# Patient Record
Sex: Male | Born: 1955 | Race: White | Hispanic: No | Smoking: Current every day smoker
Health system: Southern US, Community
[De-identification: ages and names within clinical notes are randomized; demographics above are authoritative.]

## PROBLEM LIST (undated history)

## (undated) DIAGNOSIS — J189 Pneumonia, unspecified organism: Secondary | ICD-10-CM

## (undated) DIAGNOSIS — F419 Anxiety disorder, unspecified: Secondary | ICD-10-CM

## (undated) DIAGNOSIS — F17200 Nicotine dependence, unspecified, uncomplicated: Secondary | ICD-10-CM

## (undated) DIAGNOSIS — K219 Gastro-esophageal reflux disease without esophagitis: Secondary | ICD-10-CM

## (undated) DIAGNOSIS — I10 Essential (primary) hypertension: Secondary | ICD-10-CM

## (undated) DIAGNOSIS — F209 Schizophrenia, unspecified: Secondary | ICD-10-CM

## (undated) DIAGNOSIS — J449 Chronic obstructive pulmonary disease, unspecified: Secondary | ICD-10-CM

## (undated) HISTORY — PX: TONSILLECTOMY: SUR1361

## (undated) HISTORY — DX: Chronic obstructive pulmonary disease, unspecified: J44.9

## (undated) HISTORY — DX: Gastro-esophageal reflux disease without esophagitis: K21.9

---

## 2004-10-05 ENCOUNTER — Ambulatory Visit: Payer: Self-pay | Admitting: Psychiatry

## 2004-10-05 ENCOUNTER — Inpatient Hospital Stay (HOSPITAL_COMMUNITY): Admission: RE | Admit: 2004-10-05 | Discharge: 2004-10-24 | Payer: Self-pay | Admitting: Psychiatry

## 2004-10-05 ENCOUNTER — Emergency Department (HOSPITAL_COMMUNITY): Admission: EM | Admit: 2004-10-05 | Discharge: 2004-10-05 | Payer: Self-pay | Admitting: Emergency Medicine

## 2004-10-13 ENCOUNTER — Ambulatory Visit (HOSPITAL_COMMUNITY): Admission: RE | Admit: 2004-10-13 | Discharge: 2004-10-13 | Payer: Self-pay | Admitting: Psychiatry

## 2004-10-20 ENCOUNTER — Ambulatory Visit (HOSPITAL_COMMUNITY): Admission: RE | Admit: 2004-10-20 | Discharge: 2004-10-20 | Payer: Self-pay | Admitting: Internal Medicine

## 2004-11-12 ENCOUNTER — Inpatient Hospital Stay (HOSPITAL_COMMUNITY): Admission: EM | Admit: 2004-11-12 | Discharge: 2004-11-12 | Payer: Self-pay | Admitting: Psychiatry

## 2004-11-12 ENCOUNTER — Ambulatory Visit (HOSPITAL_COMMUNITY): Admission: RE | Admit: 2004-11-12 | Discharge: 2004-11-12 | Payer: Self-pay | Admitting: Psychiatry

## 2004-11-12 ENCOUNTER — Ambulatory Visit: Payer: Self-pay | Admitting: Psychiatry

## 2005-05-02 ENCOUNTER — Emergency Department (HOSPITAL_COMMUNITY): Admission: EM | Admit: 2005-05-02 | Discharge: 2005-05-02 | Payer: Self-pay | Admitting: *Deleted

## 2005-08-16 ENCOUNTER — Emergency Department (HOSPITAL_COMMUNITY): Admission: EM | Admit: 2005-08-16 | Discharge: 2005-08-16 | Payer: Self-pay | Admitting: Family Medicine

## 2005-09-30 ENCOUNTER — Emergency Department (HOSPITAL_COMMUNITY): Admission: EM | Admit: 2005-09-30 | Discharge: 2005-10-01 | Payer: Self-pay | Admitting: Emergency Medicine

## 2012-02-15 ENCOUNTER — Encounter (HOSPITAL_COMMUNITY): Payer: Self-pay | Admitting: *Deleted

## 2012-02-15 ENCOUNTER — Emergency Department (HOSPITAL_COMMUNITY): Payer: Medicare Other

## 2012-02-15 ENCOUNTER — Emergency Department (HOSPITAL_COMMUNITY)
Admission: EM | Admit: 2012-02-15 | Discharge: 2012-02-18 | Disposition: A | Discharge status: Other Acute Inpatient Hospital | Payer: Medicare Other | Source: Home / Self Care | Attending: Emergency Medicine | Admitting: Emergency Medicine

## 2012-02-15 ENCOUNTER — Other Ambulatory Visit (HOSPITAL_COMMUNITY): Payer: Self-pay | Admitting: Pharmacy Technician

## 2012-02-15 DIAGNOSIS — R4182 Altered mental status, unspecified: Secondary | ICD-10-CM | POA: Insufficient documentation

## 2012-02-15 DIAGNOSIS — IMO0002 Reserved for concepts with insufficient information to code with codable children: Secondary | ICD-10-CM | POA: Insufficient documentation

## 2012-02-15 DIAGNOSIS — J90 Pleural effusion, not elsewhere classified: Secondary | ICD-10-CM | POA: Diagnosis not present

## 2012-02-15 DIAGNOSIS — R079 Chest pain, unspecified: Secondary | ICD-10-CM | POA: Insufficient documentation

## 2012-02-15 DIAGNOSIS — F22 Delusional disorders: Secondary | ICD-10-CM | POA: Insufficient documentation

## 2012-02-15 DIAGNOSIS — R918 Other nonspecific abnormal finding of lung field: Secondary | ICD-10-CM | POA: Diagnosis not present

## 2012-02-15 DIAGNOSIS — J189 Pneumonia, unspecified organism: Secondary | ICD-10-CM

## 2012-02-15 DIAGNOSIS — F209 Schizophrenia, unspecified: Secondary | ICD-10-CM

## 2012-02-15 DIAGNOSIS — R000 Tachycardia, unspecified: Secondary | ICD-10-CM | POA: Insufficient documentation

## 2012-02-15 DIAGNOSIS — R0602 Shortness of breath: Secondary | ICD-10-CM | POA: Insufficient documentation

## 2012-02-15 DIAGNOSIS — R413 Other amnesia: Secondary | ICD-10-CM | POA: Insufficient documentation

## 2012-02-15 HISTORY — DX: Schizophrenia, unspecified: F20.9

## 2012-02-15 HISTORY — DX: Essential (primary) hypertension: I10

## 2012-02-15 LAB — URINALYSIS, ROUTINE W REFLEX MICROSCOPIC
Bilirubin Urine: NEGATIVE
Glucose, UA: NEGATIVE mg/dL
Hgb urine dipstick: NEGATIVE
Ketones, ur: NEGATIVE mg/dL
Leukocytes, UA: NEGATIVE
Nitrite: NEGATIVE
Protein, ur: NEGATIVE mg/dL
Specific Gravity, Urine: 1.015 (ref 1.005–1.030)
Urobilinogen, UA: 0.2 mg/dL (ref 0.0–1.0)
pH: 6.5 (ref 5.0–8.0)

## 2012-02-15 LAB — COMPREHENSIVE METABOLIC PANEL
ALT: 27 U/L (ref 0–53)
AST: 34 U/L (ref 0–37)
Albumin: 3.8 g/dL (ref 3.5–5.2)
Alkaline Phosphatase: 115 U/L (ref 39–117)
BUN: 15 mg/dL (ref 6–23)
CO2: 27 mEq/L (ref 19–32)
Calcium: 9.5 mg/dL (ref 8.4–10.5)
Chloride: 99 mEq/L (ref 96–112)
Creatinine, Ser: 1.54 mg/dL — ABNORMAL HIGH (ref 0.50–1.35)
GFR calc Af Amer: 57 mL/min — ABNORMAL LOW (ref >90)
GFR calc non Af Amer: 50 mL/min — ABNORMAL LOW (ref >90)
Glucose, Bld: 100 mg/dL — ABNORMAL HIGH (ref 70–99)
Potassium: 3.4 mEq/L — ABNORMAL LOW (ref 3.5–5.1)
Sodium: 137 mEq/L (ref 135–145)
Total Bilirubin: 0.5 mg/dL (ref 0.3–1.2)
Total Protein: 7.6 g/dL (ref 6.0–8.3)

## 2012-02-15 LAB — RAPID URINE DRUG SCREEN, HOSP PERFORMED
Amphetamines: NOT DETECTED
Barbiturates: NOT DETECTED
Benzodiazepines: POSITIVE — AB
Cocaine: NOT DETECTED
Opiates: NOT DETECTED
Tetrahydrocannabinol: POSITIVE — AB

## 2012-02-15 LAB — CBC
HCT: 41 % (ref 39.0–52.0)
Hemoglobin: 13.6 g/dL (ref 13.0–17.0)
MCH: 27.7 pg (ref 26.0–34.0)
MCHC: 33.2 g/dL (ref 30.0–36.0)
MCV: 83.5 fL (ref 78.0–100.0)
Platelets: 220 10*3/uL (ref 150–400)
RBC: 4.91 MIL/uL (ref 4.22–5.81)
RDW: 16.1 % — ABNORMAL HIGH (ref 11.5–15.5)
WBC: 20 10*3/uL — ABNORMAL HIGH (ref 4.0–10.5)

## 2012-02-15 LAB — ETHANOL: Alcohol, Ethyl (B): <11 mg/dL (ref 0–11)

## 2012-02-15 MED ORDER — NICOTINE 21 MG/24HR TD PT24
21.0000 mg | MEDICATED_PATCH | Freq: Once | TRANSDERMAL | Status: AC
  Administered 2012-02-15 – 2012-02-16 (×2): 21 mg via TRANSDERMAL
  Filled 2012-02-15 (×2): qty 1

## 2012-02-15 MED ORDER — HALOPERIDOL LACTATE 5 MG/ML IJ SOLN
10.0000 mg | Freq: Once | INTRAMUSCULAR | Status: AC
  Administered 2012-02-15: 10 mg via INTRAMUSCULAR
  Filled 2012-02-15: qty 2

## 2012-02-15 MED ORDER — LORAZEPAM 2 MG/ML IJ SOLN
2.0000 mg | Freq: Once | INTRAMUSCULAR | Status: AC
  Administered 2012-02-15: 2 mg via INTRAMUSCULAR
  Filled 2012-02-15: qty 1
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
## 2012-02-15 NOTE — ED Notes (Signed)
Ballard Russell, pt's brother is taking all belongings home with him. [phone]
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
## 2012-02-15 NOTE — BH Assessment (Signed)
Assessment Note   David [name] is an 56 y.o. male who presents to [facility] under IVC. Pt refused to answer most of this writers questions, Information primarily comes from pt's family, who are at bedside. Pt's brother, Ballard Russell [phone]), and Father Jermery Caratachea [phone]), report pt has a history of schizophrenia. Family reports pt was diagnosed when he was 73. Per family, pt has been hospitalized numerous times over past 30 years, including several stays at [facility] and [facility]. Family states pt has been receiving haldol injections of 150 mg monthly and taking Librium 25mg , for the past 5 years, and has not displayed any symptoms congruent with schizophrenia during that time. Family reports pt has had difficulty with insurance paying for medications and has not been receiving Librium for past couple of weeks. Family reports pt has not slept in past 5 days. They state he has been pacing the floors, talking to the television, and stating that he is "the king." Pt has also had an increase in aggression towards family since Tuesday. Brother reports "its like a light switched, he was my brother one day, then Tuesday I couldn't talk to him, he was out there." Family reports pt's step father and aunt died in the past 2 weeks, which family believed may have triggered recent episode. Per family, pt has not expressed any SI or HI and denies any current SA. 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   Pt appeared to be responding to internal stimuli. Pt was not willing to answer question at this time, stating "I'd rather not say," any time asked a question. Pt appeared to be calm at this time. Pt's mother David [name] is pt's POA. POA Paperwork in pt's chart.          Axis I: Psychotic Disorder NOS Axis II: Deferred Axis III:  Past Medical History  Diagnosis Date  . Schizophrenia   . Hypertension    Axis IV: other psychosocial or environmental problems and problems with access  to health care services Axis V: 11-20 some danger of hurting self or others possible OR occasionally fails to maintain minimal personal hygiene OR gross impairment in communication  Past Medical History:  Past Medical History  Diagnosis Date  . Schizophrenia   . Hypertension     History reviewed. No pertinent past surgical history.  Family History: History reviewed. No pertinent family history.  Social History:  does not have a smoking history on file. He does not have any smokeless tobacco history on file. His alcohol and drug histories not on file.  Additional Social History:  Alcohol / Drug Use History of alcohol / drug use?: Yes Substance #1 Name of Substance 1: Alcohol 1 - Last Use / Amount: 12 weeks ago/ unknown amount.  Allergies: No Known Allergies  Home Medications:  Medications Prior to Admission  Medication Dose Route Frequency Provider Last Rate Last Dose  . haloperidol lactate (HALDOL) injection 10 mg  10 mg Intramuscular Once Doug Sou, MD   10 mg at 02/15/12 2032  . LORazepam (ATIVAN) injection 2 mg  2 mg Intramuscular Once Doug Sou, MD   2 mg at 02/15/12 2031   No current outpatient prescriptions on file as of 02/15/2012.    OB/GYN Status:  No LMP for male patient.  General Assessment Data Location of Assessment: [facility] ED ACT Assessment: Yes Living Arrangements: Family members;Parent Can pt return to current living arrangement?: Yes Admission Status: Involuntary Is patient capable  of signing voluntary admission?: No Transfer from: Acute Hospital Referral Source: Self/Family/Friend  Education Status Is patient currently in school?: No  Risk to self Suicidal Ideation: No Suicidal Intent: No Is patient at risk for suicide?: No Suicidal Plan?: No Access to Means: Yes Specify Access to Suicidal Means: mediactions What has been your use of drugs/alcohol within the last 12 months?: alcohol Previous Attempts/Gestures: No How many times?: 0    Other Self Harm Risks: none Triggers for Past Attempts: None known Intentional Self Injurious Behavior: None Family Suicide History: No Recent stressful life event(s): Loss (Comment) (death of step father and aunt in past week) Persecutory voices/beliefs?: No Depression: No Depression Symptoms:  (none noted) Substance abuse history and/or treatment for substance abuse?: No Suicide prevention information given to non-admitted patients: Not applicable  Risk to Others Homicidal Ideation: No Thoughts of Harm to Others: No Current Homicidal Intent: No Current Homicidal Plan: No Access to Homicidal Means: No Identified Victim: none History of harm to others?: Yes Assessment of Violence: None Noted Does patient have access to weapons?: No Criminal Charges Pending?: No Does patient have a court date: No  Psychosis Hallucinations: Auditory;Visual Delusions: Grandiose  Mental Status Report Appear/Hygiene: Bizarre Eye Contact: Poor Motor Activity: Unremarkable Speech: Aggressive Level of Consciousness: Alert Mood: Suspicious;Apprehensive Affect: Apprehensive Anxiety Level: Minimal Thought Processes: Flight of Ideas Judgement: Impaired Orientation: Person;Place Obsessive Compulsive Thoughts/Behaviors: None  Cognitive Functioning Concentration: Normal Memory: Recent Intact IQ: Average Insight: Poor Impulse Control: Poor Appetite: Fair Weight Loss: 0  Weight Gain: 0  Sleep: Decreased Total Hours of Sleep: 1  Vegetative Symptoms: None  Prior Inpatient Therapy Prior Inpatient Therapy: Yes Prior Therapy Dates: unknown exact dates, several times over past 30 years Prior Therapy Facilty/Provider(s): [facility] and [facility] Reason for Treatment: psychosis  Prior Outpatient Therapy Prior Outpatient Therapy: Yes Prior Therapy Dates: ongoing Prior Therapy Facilty/Provider(s): [facility]  Reason for Treatment: schizophrenia  ADL Screening (condition at time of  admission) Patient's cognitive ability adequate to safely complete daily activities?: Yes Patient able to express need for assistance with ADLs?: Yes Independently performs ADLs?: Yes Weakness of Legs: None Weakness of Arms/Hands: None       Abuse/Neglect Assessment (Assessment to be complete while patient is alone) Physical Abuse: Denies Verbal Abuse: Denies Sexual Abuse: Denies Exploitation of patient/patient's resources: Denies Self-Neglect: Denies Values / Beliefs Cultural Requests During Hospitalization: None Spiritual Requests During Hospitalization: None   Advance Directives (For Healthcare) Advance Directive: Patient has advance directive, copy in chart Type of Advance Directive: Other (Comment) (Power of attorney) Nutrition Screen Diet: Regular (Sandwich and drink given) Unintentional weight loss greater than 10lbs within the last month: No Dysphagia: No Home Tube Feeding or Total Parenteral Nutrition (TPN): No Patient appears severely malnourished: No Pregnant or Lactating: No  Additional Information 1:1 In Past 12 Months?: No CIRT Risk: Yes Elopement Risk: No Does patient have medical clearance?: Yes     Disposition:  Disposition Disposition of Patient: Inpatient treatment program Type of inpatient treatment program: Adult  On Site Evaluation by:   Reviewed with Physician:     Marjean Donna 02/15/2012 9:46 PM

## 2012-02-16 ENCOUNTER — Emergency Department (HOSPITAL_COMMUNITY): Payer: Medicare Other

## 2012-02-16 DIAGNOSIS — R0602 Shortness of breath: Secondary | ICD-10-CM | POA: Diagnosis not present

## 2012-02-16 DIAGNOSIS — J90 Pleural effusion, not elsewhere classified: Secondary | ICD-10-CM | POA: Diagnosis not present

## 2012-02-16 DIAGNOSIS — R918 Other nonspecific abnormal finding of lung field: Secondary | ICD-10-CM | POA: Diagnosis not present

## 2012-02-16 LAB — CBC
HCT: 39.6 % (ref 39.0–52.0)
Hemoglobin: 12.7 g/dL — ABNORMAL LOW (ref 13.0–17.0)
MCHC: 32.1 g/dL (ref 30.0–36.0)
MCV: 84.4 fL (ref 78.0–100.0)

## 2012-02-16 LAB — DIFFERENTIAL
Basophils Relative: 1 % (ref 0–1)
Lymphocytes Relative: 12 % (ref 12–46)
Monocytes Absolute: 1.8 10*3/uL — ABNORMAL HIGH (ref 0.1–1.0)
Monocytes Relative: 9 % (ref 3–12)
Neutro Abs: 13.3 10*3/uL — ABNORMAL HIGH (ref 1.7–7.7)

## 2012-02-16 MED ORDER — IBUPROFEN 200 MG PO TABS: 400.0000 mg | ORAL_TABLET | Freq: Four times a day (QID) | ORAL | Status: DC | PRN

## 2012-02-16 MED ORDER — DIAZEPAM 5 MG PO TABS
10.0000 mg | ORAL_TABLET | Freq: Two times a day (BID) | ORAL | Status: DC
  Administered 2012-02-16 – 2012-02-18 (×4): 10 mg via ORAL
  Filled 2012-02-16: qty 1
  Filled 2012-02-16 (×2): qty 2
  Filled 2012-02-16 (×3): qty 1

## 2012-02-16 MED ORDER — PANTOPRAZOLE SODIUM 40 MG PO TBEC
40.0000 mg | DELAYED_RELEASE_TABLET | ORAL | Status: DC
  Administered 2012-02-16 – 2012-02-18 (×3): 40 mg via ORAL
  Filled 2012-02-16 (×3): qty 1

## 2012-02-16 MED ORDER — LEVOFLOXACIN 750 MG PO TABS
750.0000 mg | ORAL_TABLET | Freq: Every day | ORAL | Status: DC
  Administered 2012-02-16 – 2012-02-18 (×3): 750 mg via ORAL
  Filled 2012-02-16 (×4): qty 1

## 2012-02-16 MED ORDER — NICOTINE 21 MG/24HR TD PT24: 21.0000 mg | MEDICATED_PATCH | Freq: Once | TRANSDERMAL | Status: DC

## 2012-02-16 MED ORDER — ZOLPIDEM TARTRATE 10 MG PO TABS
10.0000 mg | ORAL_TABLET | Freq: Every evening | ORAL | Status: DC | PRN
  Administered 2012-02-17: 10 mg via ORAL
  Filled 2012-02-16: qty 1

## 2012-02-16 MED ORDER — POTASSIUM CHLORIDE CRYS ER 20 MEQ PO TBCR
40.0000 meq | EXTENDED_RELEASE_TABLET | Freq: Once | ORAL | Status: AC
  Administered 2012-02-16: 40 meq via ORAL
  Filled 2012-02-16: qty 2

## 2012-02-16 MED ORDER — OLANZAPINE 5 MG PO TABS
5.0000 mg | ORAL_TABLET | Freq: Two times a day (BID) | ORAL | Status: DC
  Administered 2012-02-16 – 2012-02-18 (×4): 5 mg via ORAL
  Filled 2012-02-16 (×4): qty 1

## 2012-02-16 MED ORDER — IBUPROFEN 200 MG PO TABS
400.0000 mg | ORAL_TABLET | Freq: Once | ORAL | Status: AC
  Administered 2012-02-16: 400 mg via ORAL
  Filled 2012-02-16: qty 2

## 2012-02-16 MED ORDER — LORAZEPAM 1 MG PO TABS
2.0000 mg | ORAL_TABLET | Freq: Four times a day (QID) | ORAL | Status: DC | PRN
  Administered 2012-02-16 – 2012-02-18 (×6): 2 mg via ORAL
  Filled 2012-02-16 (×7): qty 2

## 2012-02-16 NOTE — ED Provider Notes (Signed)
8:09 AM Assessed  this morning. Patient is in no acute distress. Initial workup revealed a leukocytosis which is consistent on repeat labs. Followup chest x-ray is consistent with the first is well. Not clinically convincing for pneumonia but concerning enough to treat. Patient is tachycardic on my examination and per review of vital signs has been so since arrival. He is hemodynamically stable otherwise. Did appreciate right-sided rhonchi on lung auscultation but L clear. Coughing as well. Patient does have a history of smoking and states that he has a chronic cough. Is clearly psychotic but was cooperative with me this morning. From my perspective can continue to be tx'd with oral abx if placed in psych facility.  Raeford Razor, MD 02/16/12 [phone]

## 2012-02-16 NOTE — ED Notes (Addendum)
Pt out to the desk asking for the floor plans of the building to give to his brother greg when asked why he needed this pt reports that he just wants it to be safe for everybody pt was reassured that this was a safe place and that we were taking good care of him pt agreed with me. Pt was offered a snack sprite and rice crispy given to pt pt then asked how long have theses tiles been here pt concerned with the cleaning process of the floor pt reports that the floor needs to cleaned commercially 2 times a week  pt then back to his room pt then back out to the desk talking about how to take out an add in the paper pt talking about blasting a mountain to make a passage way for his friend Merlyn Albert he is a friend of [employer]

## 2012-02-16 NOTE — BH Assessment (Signed)
Assessment Note   David [name] is an 56 y.o. male who was reassessed 02/16/12. Pt is under IVC for bizarre behavior. Pt is currently delusional. Pt responded to writer's questions by discussing his relationship with pro tennis player Deeann Dowse. 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 He also states he'd like to "kiss the fool" out of her. His affect is calm and cooperative. Pt denies SI and HI. He also denies A/VH.  From pt's 02/14/15 assessment -Information primarily comes from pt's family, who are at bedside. Pt's brother, Ballard Russell [phone]), and Father Eesa Justiss [phone]), report pt has a history of schizophrenia. Family reports pt was diagnosed when he was 23. Per family, pt has been hospitalized numerous times over past 30 years, including several stays at [facility] and [facility]. Family states pt has been receiving haldol injections of 150 mg monthly and taking Librium 25mg , for the past 5 years, and has not displayed any symptoms congruent with schizophrenia during that time. Family reports pt has had difficulty with insurance paying for medications and has not been receiving Librium for past couple of weeks. Family reports pt has not slept in past 5 days. They state he has been pacing the floors, talking to the television, and stating that he is "the king." Pt has also had an increase in aggression towards family since Tuesday. Brother reports "its like a light switched, he was my brother one day, then Tuesday I couldn't talk to him, he was out there." Family reports pt's step father and aunt died in the past 2 weeks, which family believed may have triggered recent episode. Per family, pt has not expressed any SI or HI and denies any current SA. 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   Axis I: Psychotic Disorder NOS Axis II: Deferred Axis III:  Past Medical History  Diagnosis Date  . Schizophrenia     . Hypertension    Axis IV: other psychosocial or environmental problems and problems with access to health care services Axis V: 11-20 some danger of hurting self or others possible OR occasionally fails to maintain minimal personal hygiene OR gross impairment in communication  Past Medical History:  Past Medical History  Diagnosis Date  . Schizophrenia   . Hypertension     History reviewed. No pertinent past surgical history.  Family History: History reviewed. No pertinent family history.  Social History:  does not have a smoking history on file. He does not have any smokeless tobacco history on file. His alcohol and drug histories not on file.  Additional Social History:  Alcohol / Drug Use History of alcohol / drug use?: Yes Substance #1 Name of Substance 1: Alcohol 1 - Last Use / Amount: 12 weeks ago/ unknown amount.  Allergies: No Known Allergies  Home Medications:  Medications Prior to Admission  Medication Dose Route Frequency Provider Last Rate Last Dose  . diazepam (VALIUM) tablet 10 mg  10 mg Oral BID Glynn Octave, MD   10 mg at 02/16/12 2245  . haloperidol lactate (HALDOL) injection 10 mg  10 mg Intramuscular Once Doug Sou, MD   10 mg at 02/15/12 2032  . ibuprofen (ADVIL,MOTRIN) tablet 400 mg  400 mg Oral Once Raeford Razor, MD   400 mg at 02/16/12 1248  . ibuprofen (ADVIL,MOTRIN) tablet 400 mg  400 mg Oral Q6H PRN Raeford Razor, MD      .  levofloxacin (LEVAQUIN) tablet 750 mg  750 mg Oral Daily Raeford Razor, MD   750 mg at 02/16/12 1038  . LORazepam (ATIVAN) injection 2 mg  2 mg Intramuscular Once Doug Sou, MD   2 mg at 02/15/12 2031  . LORazepam (ATIVAN) injection 2 mg  2 mg Intramuscular Once Doug Sou, MD   2 mg at 02/15/12 2220  . LORazepam (ATIVAN) tablet 2 mg  2 mg Oral Q6H PRN Doug Sou, MD   2 mg at 02/16/12 1652  . nicotine (NICODERM CQ - dosed in mg/24 hours) patch 21 mg  21 mg Transdermal Once Doug Sou, MD   21 mg at  02/16/12 0843  . OLANZapine (ZYPREXA) tablet 5 mg  5 mg Oral BID Glynn Octave, MD   5 mg at 02/16/12 2245  . pantoprazole (PROTONIX) EC tablet 40 mg  40 mg Oral 1 day or 1 dose Doug Sou, MD   40 mg at 02/16/12 0032  . potassium chloride SA (K-DUR,KLOR-CON) CR tablet 40 mEq  40 mEq Oral Once Doug Sou, MD   40 mEq at 02/16/12 0059  . zolpidem (AMBIEN) tablet 10 mg  10 mg Oral QHS PRN Doug Sou, MD      . DISCONTD: nicotine (NICODERM CQ - dosed in mg/24 hours) patch 21 mg  21 mg Transdermal Once Doug Sou, MD       No current outpatient prescriptions on file as of 02/15/2012.    OB/GYN Status:  No LMP for male patient.  General Assessment Data Location of Assessment: [facility] ACT Assessment: Yes Living Arrangements: Family members;Parent Can pt return to current living arrangement?: Yes Admission Status: Involuntary Is patient capable of signing voluntary admission?: No Transfer from: Acute Hospital Referral Source: Self/Family/Friend  Education Status Is patient currently in school?: No  Risk to self Suicidal Ideation: No Suicidal Intent: No Is patient at risk for suicide?: No Suicidal Plan?: No Access to Means: Yes Specify Access to Suicidal Means: mediactions What has been your use of drugs/alcohol within the last 12 months?: alcohol Previous Attempts/Gestures: No How many times?: 0  Other Self Harm Risks: none Triggers for Past Attempts: None known Intentional Self Injurious Behavior: None Family Suicide History: No Recent stressful life event(s): Loss (Comment) (death of step father and aunt in past week) Persecutory voices/beliefs?: No Depression: No Depression Symptoms:  (none noted) Substance abuse history and/or treatment for substance abuse?: No Suicide prevention information given to non-admitted patients: Not applicable  Risk to Others Homicidal Ideation: No Thoughts of Harm to Others: No Current Homicidal Intent: No Current Homicidal  Plan: No Access to Homicidal Means: No Identified Victim: none History of harm to others?: Yes Assessment of Violence: None Noted Does patient have access to weapons?: No Criminal Charges Pending?: No Does patient have a court date: No  Psychosis Hallucinations: Auditory;Visual Delusions: Grandiose  Mental Status Report Appear/Hygiene: Bizarre Eye Contact: Poor Motor Activity: Restlessness;Freedom of movement Speech: Aggressive Level of Consciousness: Alert Mood: Suspicious;Apprehensive Affect: Apprehensive Anxiety Level: Minimal Thought Processes: Flight of Ideas Judgement: Impaired Orientation: Person;Place Obsessive Compulsive Thoughts/Behaviors: None  Cognitive Functioning Concentration: Normal Memory: Recent Intact IQ: Average Insight: Poor Impulse Control: Poor Appetite: Fair Weight Loss: 0  Weight Gain: 0  Sleep: Decreased Total Hours of Sleep: 1  Vegetative Symptoms: None  Prior Inpatient Therapy Prior Inpatient Therapy: Yes Prior Therapy Dates: unknown exact dates, several times over past 30 years Prior Therapy Facilty/Provider(s): [facility] and [facility] Reason for Treatment: psychosis  Prior Outpatient Therapy Prior Outpatient  Therapy: Yes Prior Therapy Dates: ongoing Prior Therapy Facilty/Provider(s): [facility]  Reason for Treatment: schizophrenia  ADL Screening (condition at time of admission) Patient's cognitive ability adequate to safely complete daily activities?: Yes Patient able to express need for assistance with ADLs?: Yes Independently performs ADLs?: Yes Weakness of Legs: None Weakness of Arms/Hands: None       Abuse/Neglect Assessment (Assessment to be complete while patient is alone) Physical Abuse: Denies Verbal Abuse: Denies Sexual Abuse: Denies Exploitation of patient/patient's resources: Denies Self-Neglect: Denies Values / Beliefs Cultural Requests During Hospitalization: None Spiritual Requests During  Hospitalization: None   Advance Directives (For Healthcare) Advance Directive: Patient has advance directive, copy in chart Type of Advance Directive: Other (Comment) (Power of attorney) Nutrition Screen Diet: Regular (Sandwich and drink given) Unintentional weight loss greater than 10lbs within the last month: No Dysphagia: No Home Tube Feeding or Total Parenteral Nutrition (TPN): No Patient appears severely malnourished: No Pregnant or Lactating: No  Additional Information 1:1 In Past 12 Months?: No CIRT Risk: Yes Elopement Risk: No Does patient have medical clearance?: Yes     Disposition:  Disposition Disposition of Patient: Inpatient treatment program Type of inpatient treatment program: Adult  Telepsych recommended inpatient treatment. Under review at [facility].  On Site Evaluation by:   Reviewed with Physician:     Donnamarie Rossetti P 02/16/2012 11:11 PM

## 2012-02-16 NOTE — ED Notes (Signed)
Pt agitated, cursing, talking non-sensible, medicated per MD orders. Pt went on the other side of the hall to talk to another pt, had to be asked several times to go back to his room. Still cursing.

## 2012-02-16 NOTE — ED Notes (Signed)
Pt continues to talk to himself, cursing at times, labile but remains laying in his bed. MD notified of psychosis.
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
## 2012-02-16 NOTE — ED Notes (Signed)
Per Act to hold off on tele psych consult, pt's information is being reviewed at [facility]

## 2012-02-16 NOTE — ED Notes (Signed)
Pt was just seen by EDP

## 2012-02-16 NOTE — BH Assessment (Signed)
Assessment Note  Telepsych paperwork completed and faxed. Dr. Rob Bunting will be conducting telepsych consult.

## 2012-02-16 NOTE — BH Assessment (Deleted)
Writer called [facility] to inquire re: bed availability. Elizabeth stated to call back Mon during business hrs as the facility handles admissions during weekdays only.      

## 2012-02-16 NOTE — ED Notes (Signed)
Up in hall, requesting Valium--medicated as ordered

## 2012-02-16 NOTE — ED Provider Notes (Signed)
Medical screening examination/treatment/procedure(s) were conducted as a shared visit with non-physician practitioner(s) and myself.  I personally evaluated the patient during the encounter  Doug Sou, MD 02/16/12 [phone]
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
## 2012-02-17 MED ORDER — NICOTINE 21 MG/24HR TD PT24
MEDICATED_PATCH | TRANSDERMAL | Status: AC
  Administered 2012-02-17: 21 mg
  Filled 2012-02-17: qty 1

## 2012-02-17 MED ORDER — LORAZEPAM 1 MG PO TABS
2.0000 mg | ORAL_TABLET | Freq: Once | ORAL | Status: AC
  Administered 2012-02-17: 2 mg via ORAL

## 2012-02-17 NOTE — ED Notes (Signed)
Pt up to nursing station talking non-sensical ie ,  is going to have brown stringy stuff running down all over it but most of it can go to [place]; redirected to room.

## 2012-02-17 NOTE — ED Notes (Signed)
Pt back out to the desk asking for a level I asked pt what he needed one for pt reports while I am here I am going to check it out pt then to the br reports that the tiles need cleaned with a toothbrush. Pt then out talking to the officer in the unit pt talking loudly was reminded that the time was 2 am and that he needed to be quiet not to disturb others pt back to his room and then straight back out to the desk talking about his boys

## 2012-02-17 NOTE — ED Notes (Signed)
EDP talking to pt.

## 2012-02-17 NOTE — ED Notes (Signed)
Pt said that the t.v. Karma Greaser told him she was giving him a male leopard. He said she has 8 of them. Pt said he wants writer to go get his leopard. Pt took without incident.

## 2012-02-17 NOTE — ED Provider Notes (Signed)
The patient is awake alert and nontoxic in appearance with ongoing delusions in the psych ED currently under involuntary commitment and awaiting placement.  Hurman Horn, MD 02/17/12 [phone]

## 2012-02-17 NOTE — ED Notes (Signed)
Pt up to nurses station "I hate to tell ya'll this but this building is on lava, it's not dangerous right now". Reassured pt he is safe here, he agreed.

## 2012-02-17 NOTE — ED Notes (Signed)
Pt up to nurses station using the phone 

## 2012-02-17 NOTE — ED Notes (Signed)
Pt had one arm out the locked door when the lunch trays were being brought into the unit, the off duty officer and service response assisted and pt didn't get out of the unit. Just prior to that pt was intrusive with another pt's personal space. MD notified new orders obtained, v.o. readback 2 mg ativan po stat per dr Prescilla Sours

## 2012-02-17 NOTE — ED Notes (Signed)
Pt yelling out "everyone be very still", pt then yelling "I don't even get a thank you, a mother fucking thank you nothing" pt reminded that he needed to watch his language and he was disrupting others pt apologized reports "I was doing a seismic reading"

## 2012-02-18 ENCOUNTER — Encounter (HOSPITAL_COMMUNITY): Payer: Self-pay

## 2012-02-18 ENCOUNTER — Inpatient Hospital Stay (HOSPITAL_COMMUNITY)
Admission: EM | Admit: 2012-02-18 | Discharge: 2012-03-04 | Disposition: A | Discharge status: Home / Self Care | Payer: Medicare Other | Attending: Psychiatry | Admitting: Psychiatry

## 2012-02-18 ENCOUNTER — Emergency Department (HOSPITAL_COMMUNITY): Payer: Medicare Other

## 2012-02-18 ENCOUNTER — Other Ambulatory Visit: Payer: Self-pay

## 2012-02-18 DIAGNOSIS — F4320 Adjustment disorder, unspecified: Secondary | ICD-10-CM

## 2012-02-18 DIAGNOSIS — F200 Paranoid schizophrenia: Secondary | ICD-10-CM | POA: Diagnosis not present

## 2012-02-18 DIAGNOSIS — I10 Essential (primary) hypertension: Secondary | ICD-10-CM | POA: Diagnosis present

## 2012-02-18 DIAGNOSIS — R000 Tachycardia, unspecified: Secondary | ICD-10-CM | POA: Diagnosis not present

## 2012-02-18 DIAGNOSIS — D696 Thrombocytopenia, unspecified: Secondary | ICD-10-CM | POA: Diagnosis not present

## 2012-02-18 DIAGNOSIS — F05 Delirium due to known physiological condition: Secondary | ICD-10-CM | POA: Diagnosis not present

## 2012-02-18 DIAGNOSIS — J984 Other disorders of lung: Secondary | ICD-10-CM | POA: Diagnosis not present

## 2012-02-18 DIAGNOSIS — J189 Pneumonia, unspecified organism: Secondary | ICD-10-CM | POA: Diagnosis present

## 2012-02-18 DIAGNOSIS — I129 Hypertensive chronic kidney disease with stage 1 through stage 4 chronic kidney disease, or unspecified chronic kidney disease: Secondary | ICD-10-CM | POA: Diagnosis not present

## 2012-02-18 DIAGNOSIS — Z91199 Patient's noncompliance with other medical treatment and regimen due to unspecified reason: Secondary | ICD-10-CM

## 2012-02-18 DIAGNOSIS — F411 Generalized anxiety disorder: Secondary | ICD-10-CM | POA: Diagnosis present

## 2012-02-18 DIAGNOSIS — D7289 Other specified disorders of white blood cells: Secondary | ICD-10-CM | POA: Diagnosis not present

## 2012-02-18 DIAGNOSIS — R918 Other nonspecific abnormal finding of lung field: Secondary | ICD-10-CM | POA: Diagnosis not present

## 2012-02-18 DIAGNOSIS — E876 Hypokalemia: Secondary | ICD-10-CM | POA: Diagnosis not present

## 2012-02-18 DIAGNOSIS — R0609 Other forms of dyspnea: Secondary | ICD-10-CM | POA: Diagnosis not present

## 2012-02-18 DIAGNOSIS — Z79899 Other long term (current) drug therapy: Secondary | ICD-10-CM

## 2012-02-18 DIAGNOSIS — F17200 Nicotine dependence, unspecified, uncomplicated: Secondary | ICD-10-CM

## 2012-02-18 DIAGNOSIS — N183 Chronic kidney disease, stage 3 unspecified: Secondary | ICD-10-CM | POA: Diagnosis not present

## 2012-02-18 DIAGNOSIS — J159 Unspecified bacterial pneumonia: Secondary | ICD-10-CM | POA: Diagnosis not present

## 2012-02-18 DIAGNOSIS — N182 Chronic kidney disease, stage 2 (mild): Secondary | ICD-10-CM | POA: Diagnosis not present

## 2012-02-18 DIAGNOSIS — J9819 Other pulmonary collapse: Secondary | ICD-10-CM | POA: Diagnosis not present

## 2012-02-18 DIAGNOSIS — F209 Schizophrenia, unspecified: Secondary | ICD-10-CM | POA: Diagnosis not present

## 2012-02-18 DIAGNOSIS — J90 Pleural effusion, not elsewhere classified: Secondary | ICD-10-CM | POA: Diagnosis not present

## 2012-02-18 DIAGNOSIS — R1110 Vomiting, unspecified: Secondary | ICD-10-CM | POA: Diagnosis not present

## 2012-02-18 DIAGNOSIS — D649 Anemia, unspecified: Secondary | ICD-10-CM | POA: Diagnosis not present

## 2012-02-18 DIAGNOSIS — Z9119 Patient's noncompliance with other medical treatment and regimen: Secondary | ICD-10-CM

## 2012-02-18 DIAGNOSIS — E871 Hypo-osmolality and hyponatremia: Secondary | ICD-10-CM | POA: Diagnosis not present

## 2012-02-18 DIAGNOSIS — R0602 Shortness of breath: Secondary | ICD-10-CM | POA: Diagnosis not present

## 2012-02-18 LAB — URINALYSIS, ROUTINE W REFLEX MICROSCOPIC
Glucose, UA: NEGATIVE mg/dL
Leukocytes, UA: NEGATIVE
Protein, ur: NEGATIVE mg/dL
pH: 7 (ref 5.0–8.0)

## 2012-02-18 LAB — CBC
HCT: 39.7 % (ref 39.0–52.0)
Hemoglobin: 12.8 g/dL — ABNORMAL LOW (ref 13.0–17.0)
MCH: 27.3 pg (ref 26.0–34.0)
MCHC: 32.2 g/dL (ref 30.0–36.0)

## 2012-02-18 LAB — DIFFERENTIAL
Basophils Relative: 1 % (ref 0–1)
Eosinophils Absolute: 1.8 10*3/uL — ABNORMAL HIGH (ref 0.0–0.7)
Monocytes Absolute: 1.1 10*3/uL — ABNORMAL HIGH (ref 0.1–1.0)
Monocytes Relative: 8 % (ref 3–12)

## 2012-02-18 LAB — COMPREHENSIVE METABOLIC PANEL
Albumin: 3.2 g/dL — ABNORMAL LOW (ref 3.5–5.2)
BUN: 15 mg/dL (ref 6–23)
Creatinine, Ser: 1.57 mg/dL — ABNORMAL HIGH (ref 0.50–1.35)
Total Protein: 6.8 g/dL (ref 6.0–8.3)

## 2012-02-18 MED ORDER — BENZTROPINE MESYLATE 1 MG PO TABS
1.0000 mg | ORAL_TABLET | ORAL | Status: DC
  Administered 2012-02-18 – 2012-03-04 (×35): 1 mg via ORAL
  Filled 2012-02-18 (×46): qty 1

## 2012-02-18 MED ORDER — HYDROXYZINE HCL 50 MG PO TABS
50.0000 mg | ORAL_TABLET | Freq: Three times a day (TID) | ORAL | Status: DC | PRN
  Administered 2012-02-18 – 2012-02-25 (×6): 50 mg via ORAL
  Filled 2012-02-18: qty 1

## 2012-02-18 MED ORDER — LORAZEPAM 1 MG PO TABS
2.0000 mg | ORAL_TABLET | Freq: Once | ORAL | Status: AC
  Administered 2012-02-18: 2 mg via ORAL
  Administered 2012-02-19: 05:00:00 via ORAL

## 2012-02-18 MED ORDER — HYDROCHLOROTHIAZIDE 12.5 MG PO CAPS
12.5000 mg | ORAL_CAPSULE | Freq: Every day | ORAL | Status: DC
  Administered 2012-02-18: 12.5 mg via ORAL
  Filled 2012-02-18 (×2): qty 1

## 2012-02-18 MED ORDER — HALOPERIDOL 5 MG PO TABS
5.0000 mg | ORAL_TABLET | Freq: Once | ORAL | Status: AC
  Administered 2012-02-18: 5 mg via ORAL

## 2012-02-18 MED ORDER — PANTOPRAZOLE SODIUM 40 MG PO TBEC
40.0000 mg | DELAYED_RELEASE_TABLET | Freq: Every day | ORAL | Status: DC
  Administered 2012-02-18 – 2012-03-03 (×15): 40 mg via ORAL
  Filled 2012-02-18 (×16): qty 1

## 2012-02-18 MED ORDER — MAGNESIUM HYDROXIDE 400 MG/5ML PO SUSP
30.0000 mL | Freq: Every day | ORAL | Status: DC | PRN
  Administered 2012-02-23: 30 mL via ORAL

## 2012-02-18 MED ORDER — ACETAMINOPHEN 325 MG PO TABS
650.0000 mg | ORAL_TABLET | Freq: Four times a day (QID) | ORAL | Status: DC | PRN
  Administered 2012-02-18 – 2012-02-28 (×4): 650 mg via ORAL

## 2012-02-18 MED ORDER — LORAZEPAM 1 MG PO TABS
ORAL_TABLET | ORAL | Status: AC
  Administered 2012-02-18: 2 mg via ORAL
  Filled 2012-02-18: qty 2

## 2012-02-18 MED ORDER — ALUM & MAG HYDROXIDE-SIMETH 200-200-20 MG/5ML PO SUSP
30.0000 mL | ORAL | Status: DC | PRN
  Administered 2012-02-22 – 2012-03-03 (×8): 30 mL via ORAL

## 2012-02-18 MED ORDER — HALOPERIDOL 5 MG PO TABS: 5.0000 mg | ORAL_TABLET | ORAL | Status: DC | PRN

## 2012-02-18 MED ORDER — CHLORDIAZEPOXIDE HCL 5 MG PO CAPS
10.0000 mg | ORAL_CAPSULE | Freq: Three times a day (TID) | ORAL | Status: DC
  Filled 2012-02-18: qty 2

## 2012-02-18 MED ORDER — HALOPERIDOL 5 MG PO TABS
5.0000 mg | ORAL_TABLET | Freq: Four times a day (QID) | ORAL | Status: DC | PRN
  Administered 2012-02-19 – 2012-02-25 (×6): 5 mg via ORAL
  Filled 2012-02-18 (×6): qty 1

## 2012-02-18 MED ORDER — CHLORDIAZEPOXIDE HCL 10 MG PO CAPS
10.0000 mg | ORAL_CAPSULE | Freq: Three times a day (TID) | ORAL | Status: DC
  Administered 2012-02-18 (×2): 10 mg via ORAL
  Filled 2012-02-18 (×3): qty 1

## 2012-02-18 MED ORDER — HYDROXYZINE PAMOATE 25 MG PO CAPS: 50.0000 mg | ORAL_CAPSULE | Freq: Three times a day (TID) | ORAL | Status: DC | PRN

## 2012-02-18 MED ORDER — HALOPERIDOL 5 MG PO TABS
ORAL_TABLET | ORAL | Status: AC
  Administered 2012-02-18: 5 mg via ORAL
  Filled 2012-02-18: qty 1

## 2012-02-18 MED ORDER — AMLODIPINE BESYLATE 5 MG PO TABS
5.0000 mg | ORAL_TABLET | Freq: Every day | ORAL | Status: DC
  Administered 2012-02-18 – 2012-03-04 (×16): 5 mg via ORAL
  Filled 2012-02-18 (×16): qty 1

## 2012-02-18 MED ORDER — HALOPERIDOL 5 MG PO TABS
5.0000 mg | ORAL_TABLET | ORAL | Status: DC
  Administered 2012-02-18: 5 mg via ORAL
  Filled 2012-02-18 (×4): qty 1

## 2012-02-18 NOTE — ED Notes (Signed)
Pt. Ambulating in hallway near his room.

## 2012-02-18 NOTE — ED Notes (Signed)
Attempted to call report to [facility].  Maureen Ralphs reporting that pt.'s bed will not be ready until 1700 - 1800.  BH will contact us when bed available.

## 2012-02-18 NOTE — ED Provider Notes (Signed)
Updated patient's mother, [facility]) on condition (c) [phone]   Forbes Cellar, MD 02/18/12 [phone]

## 2012-02-18 NOTE — ED Notes (Signed)
Report given to [facility] in [place].  Pt. Being moved to TCU Rm.33 by Jonny Ruiz, NT.  Pt.'s belongings moved from psych ed locker 34 to 33.

## 2012-02-18 NOTE — ED Provider Notes (Signed)
Per ACT team, patient accepted at [facility]. To be transferred within the hour.  Forbes Cellar, MD 02/18/12 1521

## 2012-02-18 NOTE — ED Notes (Signed)
Pt. Came to RN window and wanted to know where his clothes were, tried to explain to pt. That he does not have D/C orders at this time, pt. In very loud voice stated his name was not Caldwell, it was "Mony".  Pt. Returned to room, gave pt. Cup of hot tea per his request.

## 2012-02-18 NOTE — ED Notes (Addendum)
Dr. Hyman Hopes talked to patient at the bedside. Informed RN that patient can return to Psych ED. Nursing report given to Rexene Agent, RN and pt escorted to room 34 by Jonny Ruiz, NT.

## 2012-02-18 NOTE — ED Provider Notes (Signed)
BP 133/74  Pulse 100  Temp(Src) 97.9 F (36.6 C) (Oral)  Resp 20  SpO2 98%   Informed previously by nursing staff who stated that the patient's heart rate increased from his baseline tachycardia throughout his stay here to 120. The patient denies chest pain or shortness of breath. EKG as below. Repeat chest x-ray shows stable infiltrate. As previously mentioned his leukocytosis is improved today. Labs up and rechecked and are generally unremarkable. The patient is resting comfortably in the TCU after monitoring and his vitals are as above. His blood pressures improved to 133/74. He states that he is not having anxiety oriented complaints at this time. I do not have an explanation for this acute rise in his heart rate but the patient appears well at this time. He is again medically cleared. Anticipate transfer for psychiatric evaluation.   Date: 02/18/2012  Rate: 106  Rhythm: sinus tachycardia  QRS Axis: normal  Intervals: normal  ST/T Wave abnormalities: normal  Conduction Disutrbances:none    Forbes Cellar, MD 02/18/12 1517

## 2012-02-18 NOTE — ED Notes (Signed)
Pt. Sitting on bed.

## 2012-02-18 NOTE — ED Notes (Signed)
Report given to [place], Charity fundraiser at [facility].  [employer] called for transportation to [facility].  Pt's brother took all belongings home when pt. Was admitted.

## 2012-02-18 NOTE — Progress Notes (Signed)
Pt denies SI/HI. However he will not state whether he is experiencing AVH. Pt became angry when called [facility]. He states to call him by his name Mony. Pt uncooperative with me and refused to sign documents, however he would cooperate with [facility]. Pt requested a male nurse for skin assessment. [facility]) came in to assist. Pt experiencing confabulations. Pt has flight of ideas and loose associations. Per report from ED nurse pt has not taken schizophrenia medication in over a month. Family dropped him off at the ED because he was becoming violent and inappropriate. Family states that he hadn't slept in 3 days at the time they dropped him off. Pt guarded and paranoid.
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
## 2012-02-18 NOTE — ED Notes (Signed)
Pt has been accepted at [facility] by Dr. Lorin Picket to attending Dr. Allena Katz. EDP has been notified.
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
## 2012-02-18 NOTE — ED Provider Notes (Signed)
BP 179/115  Pulse 104  Temp(Src) 98 F (36.7 C) (Oral)  Resp 20  SpO2 99%  Patient evaluated. No new complaints at this time, states that he is ready to go home and asking if his mother will be able to pick him up. Telepsych complete and recommends admit. Awaiting placement. BP and HR have been persistently elevated since arrival. Possible pna with leukocytosis noted on labs, possible airspace opacities. Levaquin ordered by previous provider. Per nursing staff frequently agitated, requiring prn ativan, last 0455. He is calm at this time.  David Cellar, MD 02/18/12 [phone]

## 2012-02-18 NOTE — ED Notes (Signed)
Pt. Refused blood draw stating that he was going to urgent med ctr for a complete physical.  Informed pt. That I do not have any D/C orders at this time, pt. Picked up chair in room and threw it againt the wall.  Explained to pt. That EDP did come in to see him this am and order a blood test and that is what the blood draw was for. [employer] and [facility] called into room, pt. Then allowed blood draw.  Pt.'s chair removed from room.

## 2012-02-18 NOTE — BH Assessment (Signed)
Writer spoke w/ Alinda Money at [facility]. [facility] will call back after 8 am today to gather more clinical info re: pt in order to determine whether to admit.

## 2012-02-18 NOTE — ED Notes (Signed)
Pt. In hall loudly stating that he had 3 briefcases of money that he needed to take to the [employer] and requested a police escort, explained to pt. That he does not have D/C orders now and to please go back to his room.  With [employer]/[facility] Security. Escort, pt. Went back to room.  Returned to [employer] window, stating that he needed his meds, pt. Was just given meds, [facility] Security requested pt. To return to room, pt. Did.

## 2012-02-18 NOTE — ED Provider Notes (Signed)
D/W ACT. Pt accepted for transfer to [facility].  Forbes Cellar, MD 02/18/12 [phone]

## 2012-02-18 NOTE — ED Notes (Signed)
Called BH to given report, per [facility], this pt. Requires a single room, and she will have that available sometime today and will call us when this room is open.

## 2012-02-19 MED ORDER — NICOTINE 21 MG/24HR TD PT24
21.0000 mg | MEDICATED_PATCH | Freq: Every day | TRANSDERMAL | Status: DC
  Administered 2012-02-19 – 2012-03-04 (×11): 21 mg via TRANSDERMAL
  Filled 2012-02-19 (×17): qty 1

## 2012-02-19 MED ORDER — BENZTROPINE MESYLATE 1 MG PO TABS
1.0000 mg | ORAL_TABLET | Freq: Once | ORAL | Status: AC
  Administered 2012-02-19: 1 mg via ORAL

## 2012-02-19 MED ORDER — HALOPERIDOL 5 MG PO TABS
ORAL_TABLET | ORAL | Status: AC
  Administered 2012-02-19: 10 mg via ORAL
  Filled 2012-02-19: qty 2

## 2012-02-19 MED ORDER — BENZTROPINE MESYLATE 1 MG PO TABS
ORAL_TABLET | ORAL | Status: AC
  Administered 2012-02-19: 1 mg via ORAL
  Filled 2012-02-19: qty 1

## 2012-02-19 MED ORDER — LORAZEPAM 1 MG PO TABS
2.0000 mg | ORAL_TABLET | Freq: Four times a day (QID) | ORAL | Status: DC | PRN
  Administered 2012-02-19 – 2012-03-03 (×12): 2 mg via ORAL
  Filled 2012-02-19 (×10): qty 2

## 2012-02-19 MED ORDER — HALOPERIDOL 5 MG PO TABS
10.0000 mg | ORAL_TABLET | Freq: Once | ORAL | Status: AC
  Administered 2012-02-19: 10 mg via ORAL

## 2012-02-19 MED ORDER — IBUPROFEN 800 MG PO TABS
800.0000 mg | ORAL_TABLET | Freq: Four times a day (QID) | ORAL | Status: DC | PRN
  Administered 2012-02-19 – 2012-02-27 (×2): 800 mg via ORAL
  Filled 2012-02-19 (×2): qty 1

## 2012-02-19 MED ORDER — LORAZEPAM 1 MG PO TABS
2.0000 mg | ORAL_TABLET | Freq: Once | ORAL | Status: DC
  Filled 2012-02-19 (×2): qty 2

## 2012-02-19 MED ORDER — LORAZEPAM 1 MG PO TABS
ORAL_TABLET | ORAL | Status: AC
  Filled 2012-02-19: qty 2

## 2012-02-19 MED ORDER — HALOPERIDOL 5 MG PO TABS
10.0000 mg | ORAL_TABLET | ORAL | Status: DC
  Administered 2012-02-19 – 2012-02-26 (×21): 10 mg via ORAL
  Filled 2012-02-19 (×25): qty 2

## 2012-02-19 NOTE — Progress Notes (Signed)
Pt has remained labile, angry and intrusive on the unit.  He did quiet down for a bit this morning and we discussed his self-inventory.  He rated his depression a 3 and denied any hopelessness or anxiety but did admit to A/H but did not want to discuss what the voices were saying.  He did yell out once,"hell yeah, I am hearing voices and I am delusional" then started talking about the doctor prescribing him marijuana for his "headaches"  He then started as acting as if he was protesting with a sing and wanted everyone to go down to the courthouse and demand that marijuana be legalized.  He was given his 1400 meds at 1258 with the agreement of Dr. Allena Katz.  Pt is now in his room still cursing out at times but in his own space.

## 2012-02-19 NOTE — Progress Notes (Signed)
I hallway pacing, cursing and practicing his serve on approach. Appears anxious and agitated but is willing to talk with writer in his room. Was cooperative. No acute distress noted. Had discussed pts behavior with Mickie who ordered prn Ativan. Discussed with pt who readily agreed to take the prns. Did endorse AVH but refused to go into detail. Denies SI/HI and pain. States he had a good day and got what he needed. Support and encouragement provided. POC and medications reviewed and understanding verbalized, but will most like need a great deal of reinforcement. Safety has been maintained with Q15 minute observation. Will f/u response to prn and cont current POC.

## 2012-02-19 NOTE — Tx Team (Signed)
Met with patient in Aftercare Planning Group.   He had difficulty attending to the group rules, particularly with observing the 3 group rules of confidentiality, respect and limiting distractions.  He had multiple outbursts, and did not like when Case Manager attempted to redirect him.  He would not answer questions asked, includiing with whom he lives.  Counselor did report that patient told her he lives with his brother [name].  Patient had one outburst during group in which he said he wants a concealed handgun.  When Case Manager asked him why, what he would use it for, he responded that he always has happy thoughts like the mountains.  Case Manager could not make sense of conversation, and patient was agitated.  No case management neds today.  Per State Regulation 482.30  This chart was reviewed for medical necessity with respect to the patient's Admission/Duration of stay.   Next review due:  11/21/12   David Mantle, LCSW  02/19/2012  11:36 AM
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
## 2012-02-19 NOTE — Progress Notes (Signed)
[facility] Group Notes:  (Counselor/Nursing/MHT/Case Management/Adjunct)  02/19/2012 12:28 PM  Type of Therapy:  Group Therapy  Participation Level:  Minimal  Participation Quality:  Intrusive, Inattentive and Resistant  Affect:  Angry  Cognitive:  Delusional  Insight:  None  Engagement in Group:  Limited  Engagement in Therapy:  None  Modes of Intervention:  Limit-setting and Exploration  Summary of Progress/Problems: Patient began group in an angry mood because staff had asked him to be careful about his coffee because he was spilling all over the floor. When questioned about his admission his speech was illogical. Talked about being the king and other men doing something. When counselor tried to clarify what men and what they had done, he started talking about something else. He stated that he lives with his brother, but would not give counselor permission to call him.   HartisAram Beecham 02/19/2012, 12:28 PM

## 2012-02-20 DIAGNOSIS — F4320 Adjustment disorder, unspecified: Secondary | ICD-10-CM

## 2012-02-20 NOTE — Progress Notes (Signed)
Allowed pt to sleep until midmorning. Upon awakening, he was pacing and beligerent stating "Where"s my money!!!" Redirectable and compliant with medications.  Went to group, then back to bed, then to group and then snack.  No patient inventory completed.  Cont redirection and support. Reorient to reality as needed. Cont 15' checks for safety.

## 2012-02-20 NOTE — Tx Team (Signed)
Initial Interdisciplinary Treatment Plan  PATIENT STRENGTHS: (choose at least two)   PATIENT STRESSORS: Financial difficulties Medication change or noncompliance   PROBLEM LIST: Problem List/Patient Goals Date to be addressed Date deferred Reason deferred Estimated date of resolution  Paranoia 02-18-2012                                                      DISCHARGE CRITERIA:  Ability to meet basic life and health needs  PRELIMINARY DISCHARGE PLAN: Attend aftercare/continuing care group  PATIENT/FAMIILY INVOLVEMENT: This treatment plan has been presented to and reviewed with the patient, Rayson Rando, and/or family member, The patient and family have been given the opportunity to ask questions and make suggestions.  Cresenciano Lick 02/20/2012, 1:51 PM

## 2012-02-20 NOTE — Progress Notes (Signed)
Recreation Therapy Notes  02/20/2012         Time: 0930      Group Topic/Focus: The focus of this group is on discussing various styles of communication and communicating assertively using 'I' (feeling) statements.  Participation Level: None  Participation Quality: Resistant  Affect: Irritable   Cognitive: Confused   Additional Comments: Patient quietly muttering statements in the back corner of the group room such as "attach [place]." Patient louder at times, pointing at RT and saying "I want interest on my money y'all took from me here."   Dominico Rod 02/20/2012 12:51 PM

## 2012-02-20 NOTE — Progress Notes (Signed)
Patient pacing hall way for the most part of the evening. He seemed to be out of touch with reality. Complaint of having  head ache. Tylenol given for the head ache, received medication without difficulty. Patient also appeared to be responding to internal stimuli.

## 2012-02-20 NOTE — Progress Notes (Signed)
Patient ID: David [name], male   DOB: 1956-01-11, 56 y.o.   MRN: [id number] David [name] goes by [employer] (like [place] without the T).  I have been observing him this morning and he spontaneously comes out of the room to stress to [place] that he has some concern about money and what's going to happen today (but I have difficulty understanding him).  As I approach his room and open the door he is sitting on the ed of the bed and talking to persons not there. He comes to shake my hand and looks at me and greets me - is polite.  He is disheveled with hospital gown askew.  I introduce myself and talk about why I am seeing him - and he begins to look distracted.    He says says he has no suicidal thoughts, but is homicidal towards [employer] [facility] [place].  When asked about sleep his eyes light up and he sighs and says he is so grateful to have finally gotten a good night sleep.    He is obviously hoarse and complaining of laryngitis which the tech says is from his shouting so much. I advise him the chocolate milk is a good treatment for this and he asks for some - Latoya the tech will get it for him. He then walks off away from me and I am unable to engage him any longer.   Continue current plan.

## 2012-02-20 NOTE — Tx Team (Signed)
Interdisciplinary Treatment Plan Update (Adult)  Date:  02/20/2012  Time Reviewed:  10:15AM-11:00AM  Progress in Treatment: Attending groups:  No Participating in groups:    No Taking medication as prescribed:    Yes Tolerating medication:   Yes Family/Significant other contact made:  Yes Patient understands diagnosis:   No Discussing patient identified problems/goals with staff:   No Medical problems stabilized or resolved:   N/A Denies suicidal/homicidal ideation:  Denies SI, states he has HI toward [employer] Issues/concerns per patient self-inventory:   No Other:  New problem(s) identified: No, Describe:    Reason for Continuation of Hospitalization: Aggression Hallucinations Homicidal ideation Medication stabilization Other; describe paranoia  Interventions implemented related to continuation of hospitalization:  Medication monitoring and adjustment, safety checks Q15 min., suicide risk assessment, group therapy, psychoeducation, collateral contact, aftercare planning, ongoing physician assessments, medication education  Additional comments:  Not applicable  Estimated length of stay:  5-7 days  Discharge Plan:  Return to live with relatives, follow-up with [facility]  New goal(s):  Not applicable  Review of initial/current patient goals per problem list:   1.  Goal(s):  Eliminate aggressive behaviors.  Met:  No  Target date:  By Discharge   As evidenced by:  Patient is screaming/yelling in the halls consistently, other aggressive behaviors noted  2.  Goal(s):  Reduce psychotic symptoms (hallucinations) to baseline.  Met:  No  Target date:  By Discharge   As evidenced by:  Still hallucinations, responding to internal stimuli consistently.  3.  Goal(s):  Reduce paranoia to baseline.  Met:  No  Target date:  By Discharge   As evidenced by:  Remains paranoid at this time  4.  Goal(s):  Deny SI / HI for 48 hours prior to D/C.  Met:  No  Target date:   By Discharge   As evidenced by:  Is homicidal toward [employer] today  Attendees: Patient: Not able to attend due to agitation   Family:     Physician:  Dr. Harvie Heck Readling 02/20/2012 10:30AM  Nursing:  Barrie Folk, RN 02/20/2012 10:30AM    Case Manager:  Ambrose Mantle, LCSW 02/20/2012 10:30AM  Counselor:  Veto Kemps, MT-BC 02/20/2012 10:30AM  Other:      Other:      Other:      Other:       Scribe for Treatment Team:   Sarina Ser, 02/20/2012, 10:11 AM

## 2012-02-20 NOTE — Tx Team (Signed)
Initial Interdisciplinary Treatment Plan  PATIENT STRENGTHS: (choose at least two) Communication skills General fund of knowledge  PATIENT STRESSORS: Marital or family conflict Medication change or noncompliance   PROBLEM LIST: Problem List/Patient Goals Date to be addressed Date deferred Reason deferred Estimated date of resolution                                                         DISCHARGE CRITERIA:  Improved stabilization in mood, thinking, and/or behavior Reduction of life-threatening or endangering symptoms to within safe limits  PRELIMINARY DISCHARGE PLAN: Attend aftercare/continuing care group  PATIENT/FAMIILY INVOLVEMENT: This treatment plan has been presented to and reviewed with the patient, David [name], and/or family member,  The patient and family have been given the opportunity to ask questions and make suggestions.  Cresenciano Lick 02/20/2012, 1:54 PM

## 2012-02-21 MED ORDER — CHLORDIAZEPOXIDE HCL 25 MG PO CAPS
25.0000 mg | ORAL_CAPSULE | ORAL | Status: DC
  Administered 2012-02-21 – 2012-02-25 (×8): 25 mg via ORAL
  Filled 2012-02-21 (×8): qty 1

## 2012-02-21 MED ORDER — TRAZODONE HCL 50 MG PO TABS
150.0000 mg | ORAL_TABLET | Freq: Every day | ORAL | Status: DC
  Administered 2012-02-21 – 2012-03-03 (×12): 150 mg via ORAL
  Filled 2012-02-21 (×13): qty 3

## 2012-02-21 NOTE — Progress Notes (Signed)
Pt. Sitting in the day room watching television and talking to his self. Pt. Has intermittent outburst sometimes cursing.  Pt.'s mother visited with him this evening and he hugged his mother and was smiling.  Mother reports that she has power of attorney over pt.  Pt. Denies SI/HI.  Redirection needed at intervals.  Pt. Redirectable.

## 2012-02-21 NOTE — Progress Notes (Signed)
[facility] Group Notes:  (Counselor/Nursing/MHT/Case Management/Adjunct)  02/21/2012 5:53 PM  Type of Therapy:  Group Therapy  Participation Level:  Minimal  Participation Quality:  Intrusive, Resistant and disruptive  Affect:  Angry, Irritable and Labile  Cognitive:  Confused  Insight:  None  Engagement in Group:  Limited  Engagement in Therapy:  Limited  Modes of Intervention:  Education and Limit-setting  Summary of Progress/Problems: Patient kept talking while others were talking. Expressing a lot of anger, but not necessarily at anyone in particular. Conversation was not logical. Cussing a lot.   [name] 02/21/2012, 5:53 PM

## 2012-02-21 NOTE — Progress Notes (Signed)
[facility] MD Progress Note  02/21/2012 2:27 PM  Diagnosis:  Axis I: Schizophrenia - Paranoid Type.   The patient was seen today and reports the following:   ADL's: Intact.  Sleep: The patient reports to having significant difficulty initiating and maintaining sleep.  Appetite: The patient reports a good appetite.   Mild>(1-10) >Severe  Hopelessness (1-10): 0  Depression (1-10): 4  Anxiety (1-10): 10  Suicidal Ideation: The patient denies any suicidal ideation today.  Plan: No  Intent: No  Means: No   Homicidal Ideation: The patient denies any homicidal ideations today. Plan: No  Intent: No.  Means: No   General Appearance/Behavior: Unkempt and less angry today. Eye Contact: Fair to Good. Speech: Appropriate in rate but increased in volume. The patient also showed some mild pressuring.  Motor Behavior: Mild Agitated.  Level of Consciousness: Alert and Oriented x 3.  Mental Status: Alert and Oriented x 3.  Mood: Mild to Moderately Manic.  Affect: Agitated.  Anxiety Level: Severe anxiety reported.  Thought Process: Delusional and psychotic.  Thought Content: The patient reports auditory hallucinations as well as paranoid delusions. He denies any visual hallucinations.  Perception:. Delusional.  Judgment: Poor.  Insight: Poor.  Cognition: Oriented to place and person.  Sleep:  Number of Hours: 3    Vital Signs:Blood pressure 159/91, pulse 99, temperature 98.3 F (36.8 C), temperature source Oral, resp. rate 18, height 5\' 10"  (1.778 m), weight 79.379 kg (175 lb).  Current Medications: Current Facility-Administered Medications  Medication Dose Route Frequency Provider Last Rate Last Dose  . acetaminophen (TYLENOL) tablet 650 mg  650 mg Oral Q6H PRN Sanjuana Kava, NP   650 mg at 02/20/12 1711  . alum & mag hydroxide-simeth (MAALOX/MYLANTA) 200-200-20 MG/5ML suspension 30 mL  30 mL Oral Q4H PRN Sanjuana Kava, NP      . amLODipine (NORVASC) tablet 5 mg  5 mg Oral Daily Franchot Gallo, MD   5 mg at 02/21/12 0721  . benztropine (COGENTIN) tablet 1 mg  1 mg Oral [id number] Clell Trahan, MD   1 mg at 02/21/12 0721  . haloperidol (HALDOL) tablet 10 mg  10 mg Oral [id number] Brentt Fread, MD   10 mg at 02/21/12 1313  . haloperidol (HALDOL) tablet 5 mg  5 mg Oral Q6H PRN Alyson Kuroski-Mazzei, DO   5 mg at 02/20/12 0153  . hydrOXYzine (ATARAX/VISTARIL) tablet 50 mg  50 mg Oral TID PRN Franchot Gallo, MD   50 mg at 02/21/12 0820  . ibuprofen (ADVIL,MOTRIN) tablet 800 mg  800 mg Oral Q6H PRN Franchot Gallo, MD   800 mg at 02/19/12 0817  . LORazepam (ATIVAN) tablet 2 mg  2 mg Oral Once Viviann Spare, NP      . LORazepam (ATIVAN) tablet 2 mg  2 mg Oral Q6H PRN Mickie D. Adams, PA   2 mg at 02/21/12 1610  . magnesium hydroxide (MILK OF MAGNESIA) suspension 30 mL  30 mL Oral Daily PRN Sanjuana Kava, NP      . nicotine (NICODERM CQ - dosed in mg/24 hours) patch 21 mg  21 mg Transdermal Q0600 Franchot Gallo, MD   21 mg at 02/21/12 0556  . pantoprazole (PROTONIX) EC tablet 40 mg  40 mg Oral QHS Franchot Gallo, MD   40 mg at 02/20/12 2137    Lab Results: No results found for this or any previous visit (from the past 48 hour(s)).  Time was spent today discussing with the patient his  current symptoms.  The patient was more cooperative but remains easily agitated.  He was able to report that he lives with his brother and sister in law and his 75 year old niece.  Treatment Plan Summary:  1. Daily contact with patient to assess and evaluate symptoms and progress in treatment  2. Medication management  3. The patient will deny suicidal ideations or homicidal ideations for 48 hours prior to discharge and have a depression and anxiety rating of 3 or less. The patient will also deny any auditory or visual hallucinations or delusional thinking.   Plan:  1. Will continue current medications. 2. Will continue to monitor.  3. Will restart Haldol Decanoate when we determine when the  patient received his last injection.  Slayton Lubitz 02/21/2012, 2:27 PM

## 2012-02-21 NOTE — Progress Notes (Addendum)
Patient ID: David [name], male   DOB: 1956-12-02, 56 y.o.   MRN: [id number] Phone conference with Corrie Dandy RN at [facility] in [place].  She reports she gave [facility] 150mg  of Haldol Decanoate IM on February 6th, 2013.  Next appt for injection is March 05, 2012.  Spoke with Corrie Dandy a second time to ask her if there had been any other med changes or anything she knew of that could have caused him to decompensate in this way.  She reports that [name] took Librium 25mg  bid for a long time, then reported he couldn't get it paid for for reasons Corrie Dandy did not understand.  So Dr. Betti Cruz Rx him Valium 10mg  BID as a substitute on 13th.  His mother took him home and then brought hm to emergency services because he hadn't slept all night and was hollering. He was given Vistaril, and mother brought him to the ED the following day.  There have been no other med changes.   Plan: Discussed with Dr. Allena Katz and will start Librium 25mg  bid.

## 2012-02-21 NOTE — Progress Notes (Signed)
Lying quietly in bed with eyes closed.  Exhibiting normal sleep behavior.  Safety maintained via Q 15 min room checks.

## 2012-02-21 NOTE — Discharge Planning (Signed)
Met with patient in Aftercare Planning Group.   Patient did not want to answer any questions, said instead "Why can't we ask you questions?"  When Case Manager agreed to answer a question, he asked to be told something about CM.  CM complied then asked him to tell something about himself.  He pounded on the seat, and yelled "I want some pot!"  CM asked what marijuana does for him, and he said "it gets rid of this headache."  CM attempted to talk about the residual effects of the drug, and he started cursing and saying he did not want to hear that.  No case management needs today.  Ambrose Mantle, LCSW 02/21/2012, 9:23 AM

## 2012-02-22 NOTE — Progress Notes (Signed)
[facility] Group Notes:  (Counselor/Nursing/MHT/Case Management/Adjunct)  02/22/2012 10:10 AM  Type of Therapy:  Group Therapy  Participation Level:  Minimal  Participation Quality:  Inattentive  Affect:  Irritable  Cognitive:  Confused  Insight:  None  Engagement in Group:  Limited  Engagement in Therapy:  Limited  Modes of Intervention:  Limit-setting  Summary of Progress/Problems: Patient came  into group late. His conversation was not relevant to discussion. Patient kept interrupting or talking over peers and was very disruptive.    Latara Micheli, Aram Beecham 02/22/2012, 10:10 AM

## 2012-02-22 NOTE — Progress Notes (Signed)
Lying in bed with eyes closed.  While dozing in bed, he is exhibiting frequent change of positions involving torso and upper and lower extremities.  Movements do not look like typical EPS but need further evauation.   He was medicated earlier tonight with Haldol and Ativan for agitation and anxiety charaterized by pacing in hallway, talking to unseen object, profane and nonsensical  verbalizations, and restlessness.

## 2012-02-22 NOTE — Discharge Planning (Signed)
Met with patient in Aftercare Planning Group.   He was disruptive, intrusive, frequently angry and cursing, tangential.  No case management needs expressed today.  Utilization review done for additional days.  Ambrose Mantle, LCSW 02/22/2012, 3:41 PM

## 2012-02-22 NOTE — [facility] Counselor (Signed)
Adult Comprehensive Assessment  Patient ID: David [name], male DOB: 01/23/1956, 55 y.o. MRN: [id number]  Information Source:    Current Stressors:  Educational / Learning stressors: no problems reported Employment / Job issues: unemployed, disability Family Relationships: no problems reported Financial / Lack of resources (include bankruptcy): fixed income Housing / Lack of housing: lives with brother Physical health (include injuries & life threatening diseases): no problems reported Social relationships: lacks social support Substance abuse: unknown Bereavement / Loss: death of aunt  Living/Environment/Situation:  Living Arrangements: Family members (lives with his brother) Living conditions (as described by patient or guardian): small How long has patient lived in current situation?: 2 years What is atmosphere in current home: Comfortable  Family History:  Marital status: Divorced Does patient have children?: Yes How many children?: 3  How is patient's relationship with their children?: they are in the sky, don't know if they are boys or girls.  Childhood History:  By whom was/is the patient raised?: Mother/father and step-parent (mother and step-father) Description of patient's relationship with caregiver when they were a child: "tight" Patient's description of current relationship with people who raised him/her: step-father deceased Does patient have siblings?: Yes Number of Siblings: 1 (at least one brother) Description of patient's current relationship with siblings: good  Education:  Highest grade of school patient has completed: graduated high school  Currently a student?: No Learning disability?: No  Employment/Work Situation:  Employment situation: On disability Why is patient on disability: mental illness Has patient ever been in the military?: No Has patient ever served in combat?: No  Financial Resources:     Alcohol/Substance Abuse:      Social Support System:  Type of faith/religion: Moravian  Leisure/Recreation:  Leisure and Hobbies: ping pong, tennis, crafts  Strengths/Needs:     Discharge Plan:  Will patient be returning to same living situation after discharge?: Yes  Summary/Recommendations:  Summary and Recommendations (to be completed by the evaluator): Several attempts were made to complet assessment, however patient was too psychotic, or herefused to answer. As of today, he continues to be a poor historian as evidenced by his answers. Patient is a 55 year old male with diagnosis of Psychotic Disorder, NOS, Schizophrenia. He was admitted due to talking to the TV, stating that he is the king, not sleeping, pacing, and increased aggression. He has not been able to get medications. His trigger mya have been deaths of aunt and step-father. Patient will benefit from crisis stabilization, medication evaluation, group therapy and psychoeducation groups for coping skills, case managemnet for referrals and counselor to talk to family fro collateral and discharge planning.  David [name]. 02/22/2012 

## 2012-02-22 NOTE — Progress Notes (Signed)
Patient ID: David [name], male   DOB: 06/15/1956, 56 y.o.   MRN: [id number] Pt is awake in bed this AM. Pt denies SI/HI and VH but endorses hearing voices. Pt is moderately confused and has outbursts from time to time but he is redirectable and cooperative with staff. Pt is attending some groups. Pt forwards little and conversation is tangential. Writer will continue to monitor.

## 2012-02-22 NOTE — Progress Notes (Signed)
Patient ID: David [name], male   DOB: [birth year], 56 y.o.   MRN: [id number]  Fully alert, with poor eye contact, pacing in the hall with expletives and cursing under his breath. He makes eye contact with me and has a calm manner in contrast to his verbage. He follows me willingly to the consult room.  Eye contact remains poor and most of his answers are completely irrelevant. He is happy that he 'slept well half the night'.   He denies suicidal thoughts and depression.  When asked about homicidal thoughts he says he his homicidal towards Rogene Houston just because he hates his guts, and does not elaborate. I asked him if he has a primary medical doctor that he sees.  He says he is going to fly [place] Betti Cruz [place].    I told him I had spoken with his nurse Corrie Dandy at [facility], and she talked about his medications - that we had put him back on his previous medicatons including the Librium.  He has no relevant response.   He is afebrile and appears in no physical distress.  He has some bruising around his right ankle with trace swelling which I believe may be due to prior restraints. He is taking medications as prescribed. Pulse ox is 98%.   Plan: RPR, Fasting Lipid Panel, HgbA1C. BMET.  I do not believe he is stable enough to tolerate carotid dopplers at this time, but we otherwise would have these checked to see if carotid changes could be contributing to mental status changes.

## 2012-02-23 DIAGNOSIS — F200 Paranoid schizophrenia: Principal | ICD-10-CM

## 2012-02-23 LAB — LIPID PANEL
Cholesterol: 163 mg/dL (ref 0–200)
HDL: 29 mg/dL — ABNORMAL LOW (ref >39)
Total CHOL/HDL Ratio: 5.6 RATIO
Triglycerides: 132 mg/dL (ref <150)
VLDL: 26 mg/dL (ref 0–40)

## 2012-02-23 LAB — BASIC METABOLIC PANEL
BUN: 15 mg/dL (ref 6–23)
GFR calc non Af Amer: 47 mL/min — ABNORMAL LOW (ref >90)
Glucose, Bld: 93 mg/dL (ref 70–99)
Potassium: 4.1 mEq/L (ref 3.5–5.1)

## 2012-02-23 MED ORDER — DIPHENHYDRAMINE HCL 50 MG PO CAPS
50.0000 mg | ORAL_CAPSULE | Freq: Once | ORAL | Status: AC
  Administered 2012-02-23: 50 mg via ORAL

## 2012-02-23 NOTE — Progress Notes (Signed)
Pt has been pacing the hallways  Hallucinating  Talking to people who are not there   Making threatening remarks to people who are not there  He hallucinates about being on the streets and playing sports  Pt can be loud and has been making racial remarks  He easily agitates other patients  Pt took his medications without difficulty but made statements the whole time that he did not want his medications and they did him no good   Verbal support given  Medications administered and effectiveness monitored  Q 15 min checks  Redirect patient as needed  Pt safe at present time

## 2012-02-23 NOTE — Progress Notes (Signed)
Patient ID: David [name], male   DOB: Jun 07, 1956, 56 y.o.   MRN: [id number] [facility] Group Notes:  (Counselor/Nursing/MHT/Case Management/Adjunct)  02/23/2012 11 AM  Type of Therapy:  Group Therapy, Dance/Movement Therapy   Participation Level:  Did Not Attend  Kym Groom

## 2012-02-23 NOTE — Progress Notes (Signed)
Odd gesturing, self talk, and profanity continue.  Given Milk of Mag.. 30 cc  given for "can't remember" last BM.  No results from laxative thus far this night.  Continues to be wakeful but slept longer this night than last night.

## 2012-02-23 NOTE — Progress Notes (Signed)
[facility] MD Progress Note  02/23/2012 4:18 PM  Diagnosis:  Axis I: Chronic Paranoid Schizophrenia  ADL's:  Impaired  Sleep: Fair  Appetite:  Fair  Suicidal Ideation:  Plan:  no Intent:  no Means:  no Homicidal Ideation:  Plan:  no Intent:  no Means:  no  AEB (as evidenced by):  Mental Status Examination/Evaluation: Objective:  Appearance: Bizarre, Disheveled and Guarded  Eye Contact::  Fair  Speech:  Blocked, Garbled and Slurred  Volume:  Decreased  Mood:  Anxious and Depressed  Affect:  Constricted and Flat  Thought Process:  Disorganized  Orientation:  Other:  unable to answer orientation   Thought Content:  Delusions, Hallucinations: Auditory and Paranoid Ideation  Suicidal Thoughts:  No  Homicidal Thoughts:  No  Memory:  Immediate;   Poor  Judgement:  Poor  Insight:  Lacking  Psychomotor Activity:  Normal  Concentration:  Poor  Recall:  Poor  Akathisia:  No  Handed:  Right  AIMS (if indicated):     Assets:  Leisure Time  Sleep:  Number of Hours: 2.75    Vital Signs:Blood pressure 121/78, pulse 110, temperature 98.1 F (36.7 C), temperature source Oral, resp. rate 18, height 5\' 10"  (1.778 m), weight 79.379 kg (175 lb). Current Medications: Current Facility-Administered Medications  Medication Dose Route Frequency Provider Last Rate Last Dose  . acetaminophen (TYLENOL) tablet 650 mg  650 mg Oral Q6H PRN Sanjuana Kava, NP   650 mg at 02/22/12 0801  . alum & mag hydroxide-simeth (MAALOX/MYLANTA) 200-200-20 MG/5ML suspension 30 mL  30 mL Oral Q4H PRN Sanjuana Kava, NP   30 mL at 02/22/12 0000  . amLODipine (NORVASC) tablet 5 mg  5 mg Oral Daily Franchot Gallo, MD   5 mg at 02/23/12 0825  . benztropine (COGENTIN) tablet 1 mg  1 mg Oral BH-q8a2phs Franchot Gallo, MD   1 mg at 02/23/12 0825  . chlordiazePOXIDE (LIBRIUM) capsule 25 mg  25 mg Oral BH-qamhs Viviann Spare, NP   25 mg at 02/23/12 0825  . haloperidol (HALDOL) tablet 10 mg  10 mg Oral BH-q8a2phs Randy  Readling, MD   10 mg at 02/23/12 1347  . haloperidol (HALDOL) tablet 5 mg  5 mg Oral Q6H PRN Alyson Kuroski-Mazzei, DO   5 mg at 02/21/12 2358  . hydrOXYzine (ATARAX/VISTARIL) tablet 50 mg  50 mg Oral TID PRN Franchot Gallo, MD   50 mg at 02/23/12 0036  . ibuprofen (ADVIL,MOTRIN) tablet 800 mg  800 mg Oral Q6H PRN Franchot Gallo, MD   800 mg at 02/19/12 0817  . LORazepam (ATIVAN) tablet 2 mg  2 mg Oral Once Viviann Spare, NP      . LORazepam (ATIVAN) tablet 2 mg  2 mg Oral Q6H PRN Mickie D. Adams, PA   2 mg at 02/22/12 2221  . magnesium hydroxide (MILK OF MAGNESIA) suspension 30 mL  30 mL Oral Daily PRN Sanjuana Kava, NP   30 mL at 02/23/12 0038  . nicotine (NICODERM CQ - dosed in mg/24 hours) patch 21 mg  21 mg Transdermal Q0600 Franchot Gallo, MD   21 mg at 02/21/12 0556  . pantoprazole (PROTONIX) EC tablet 40 mg  40 mg Oral QHS Franchot Gallo, MD   40 mg at 02/22/12 2130  . traZODone (DESYREL) tablet 150 mg  150 mg Oral QHS Franchot Gallo, MD   150 mg at 02/22/12 2131    Lab Results:  Results for orders placed during the hospital  encounter of 02/18/12 (from the past 48 hour(s))  HEMOGLOBIN A1C     Status: Normal   Collection Time   02/23/12  7:00 AM      Component Value Range Comment   Hemoglobin A1C 5.2  <5.7 (%)    Mean Plasma Glucose 103  <117 (mg/dL)   LIPID PANEL     Status: Abnormal   Collection Time   02/23/12  7:00 AM      Component Value Range Comment   Cholesterol 163  0 - 200 (mg/dL)    Triglycerides 409  <150 (mg/dL)    HDL 29 (*) >81 (mg/dL)    Total CHOL/HDL Ratio 5.6      VLDL 26  0 - 40 (mg/dL)    LDL Cholesterol 191 (*) 0 - 99 (mg/dL)   RPR     Status: Normal   Collection Time   02/23/12  7:00 AM      Component Value Range Comment   RPR NON REACTIVE  NON REACTIVE    BASIC METABOLIC PANEL     Status: Abnormal   Collection Time   02/23/12  7:00 AM      Component Value Range Comment   Sodium 139  135 - 145 (mEq/L)    Potassium 4.1  3.5 - 5.1 (mEq/L)     Chloride 103  96 - 112 (mEq/L)    CO2 28  19 - 32 (mEq/L)    Glucose, Bld 93  70 - 99 (mg/dL)    BUN 15  6 - 23 (mg/dL)    Creatinine, Ser 4.78 (*) 0.50 - 1.35 (mg/dL)    Calcium 9.0  8.4 - 10.5 (mg/dL)    GFR calc non Af Amer 47 (*) >90 (mL/min)    GFR calc Af Amer 55 (*) >90 (mL/min)     Physical Findings: AIMS:  , ,  ,  ,    CIWA:    COWS:     Treatment Plan Summary: Daily contact with patient to assess and evaluate symptoms and progress in treatment Medication management  Plan: Continue treatment plan and no medication changes.  Analeigha Nauman,JANARDHAHA R. 02/23/2012, 4:18 PM

## 2012-02-24 NOTE — Progress Notes (Signed)
Pt continues to have auditory and visual hallucinations   He talks and threatens loudly to people who are not there  He is very intrusive and demanding a cigarette refusing the nicotine patch  He is difficult to redirect and other pts think he is talking to them when he is actually hallucinating  Verbal support and redirection given   Allowed pt to try nicotine gum to see if that would help with the cravings and agitation  Medications administered and effectiveness monitored  Q 15 min checks  Pt safe at present
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
## 2012-02-24 NOTE — Progress Notes (Signed)
Patient ID: David [name], male   DOB: Sep 04, 1956, 56 y.o.   MRN: [id number]   [facility] Group Notes:  (Counselor/Nursing/MHT/Case Management/Adjunct)  02/24/2012 11 AM  Type of Therapy:  Group Therapy, Dance/Movement Therapy   Participation Level:  Did Not Attend   Kym Groom

## 2012-02-24 NOTE — Progress Notes (Signed)
[facility] MD Progress Note  02/24/2012 11:30 AM  Diagnosis:  Axis I: Chronic Paranoid Schizophrenia  ADL's:  Impaired  Sleep: Fair  Appetite:  Fair  Suicidal Ideation:  Plan:  no Intent:  no Means:  mno Homicidal Ideation:  Plan:  no Intent:  no Means:  no  AEB (as evidenced by):  Mental Status Examination/Evaluation: Objective:  Appearance: Bizarre, Disheveled and Guarded  Eye Contact::  Poor  Speech:  Garbled and Slurred  Volume:  Decreased  Mood:  Anxious  Affect:  Blunt and Constricted  Thought Process:  Disorganized, Irrelevant and Loose  Orientation:  Other:  unable to answer meaningful way  Thought Content:  Delusions, Hallucinations: Auditory and Paranoid Ideation  Suicidal Thoughts:  No  Homicidal Thoughts:  No  Memory:  Immediate;   Poor  Judgement:  Impaired  Insight:  Lacking  Psychomotor Activity:  Psychomotor Retardation  Concentration:  Poor  Recall:  Poor  Akathisia:  No  Handed:  Right  AIMS (if indicated):     Assets:  Social Support  Sleep:  Number of Hours: 3.5    Vital Signs:Blood pressure 100/62, pulse 114, temperature 98.2 F (36.8 C), temperature source Oral, resp. rate 20, height 5\' 10"  (1.778 m), weight 79.379 kg (175 lb). Current Medications: Current Facility-Administered Medications  Medication Dose Route Frequency Provider Last Rate Last Dose  . acetaminophen (TYLENOL) tablet 650 mg  650 mg Oral Q6H PRN Sanjuana Kava, NP   650 mg at 02/22/12 0801  . alum & mag hydroxide-simeth (MAALOX/MYLANTA) 200-200-20 MG/5ML suspension 30 mL  30 mL Oral Q4H PRN Sanjuana Kava, NP   30 mL at 02/22/12 0000  . amLODipine (NORVASC) tablet 5 mg  5 mg Oral Daily Franchot Gallo, MD   5 mg at 02/24/12 0755  . benztropine (COGENTIN) tablet 1 mg  1 mg Oral BH-q8a2phs Franchot Gallo, MD   1 mg at 02/23/12 2106  . chlordiazePOXIDE (LIBRIUM) capsule 25 mg  25 mg Oral BH-qamhs Viviann Spare, NP   25 mg at 02/24/12 0755  . diphenhydrAMINE (BENADRYL) capsule 50 mg   50 mg Oral Once Verne Spurr, PA   50 mg at 02/23/12 2226  . haloperidol (HALDOL) tablet 10 mg  10 mg Oral BH-q8a2phs Randy Readling, MD   10 mg at 02/24/12 0755  . haloperidol (HALDOL) tablet 5 mg  5 mg Oral Q6H PRN Alyson Kuroski-Mazzei, DO   5 mg at 02/21/12 2358  . hydrOXYzine (ATARAX/VISTARIL) tablet 50 mg  50 mg Oral TID PRN Franchot Gallo, MD   50 mg at 02/23/12 0036  . ibuprofen (ADVIL,MOTRIN) tablet 800 mg  800 mg Oral Q6H PRN Franchot Gallo, MD   800 mg at 02/19/12 0817  . LORazepam (ATIVAN) tablet 2 mg  2 mg Oral Once Viviann Spare, NP      . LORazepam (ATIVAN) tablet 2 mg  2 mg Oral Q6H PRN Mickie D. Adams, PA   2 mg at 02/24/12 0647  . magnesium hydroxide (MILK OF MAGNESIA) suspension 30 mL  30 mL Oral Daily PRN Sanjuana Kava, NP   30 mL at 02/23/12 0038  . nicotine (NICODERM CQ - dosed in mg/24 hours) patch 21 mg  21 mg Transdermal Q0600 Franchot Gallo, MD   21 mg at 02/21/12 0556  . pantoprazole (PROTONIX) EC tablet 40 mg  40 mg Oral QHS Franchot Gallo, MD   40 mg at 02/23/12 2106  . traZODone (DESYREL) tablet 150 mg  150 mg Oral QHS  Franchot Gallo, MD   150 mg at 02/23/12 2109    Lab Results:  Results for orders placed during the hospital encounter of 02/18/12 (from the past 48 hour(s))  HEMOGLOBIN A1C     Status: Normal   Collection Time   02/23/12  7:00 AM      Component Value Range Comment   Hemoglobin A1C 5.2  <5.7 (%)    Mean Plasma Glucose 103  <117 (mg/dL)   LIPID PANEL     Status: Abnormal   Collection Time   02/23/12  7:00 AM      Component Value Range Comment   Cholesterol 163  0 - 200 (mg/dL)    Triglycerides 098  <150 (mg/dL)    HDL 29 (*) >11 (mg/dL)    Total CHOL/HDL Ratio 5.6      VLDL 26  0 - 40 (mg/dL)    LDL Cholesterol 914 (*) 0 - 99 (mg/dL)   RPR     Status: Normal   Collection Time   02/23/12  7:00 AM      Component Value Range Comment   RPR NON REACTIVE  NON REACTIVE    BASIC METABOLIC PANEL     Status: Abnormal   Collection Time   02/23/12   7:00 AM      Component Value Range Comment   Sodium 139  135 - 145 (mEq/L)    Potassium 4.1  3.5 - 5.1 (mEq/L)    Chloride 103  96 - 112 (mEq/L)    CO2 28  19 - 32 (mEq/L)    Glucose, Bld 93  70 - 99 (mg/dL)    BUN 15  6 - 23 (mg/dL)    Creatinine, Ser 7.82 (*) 0.50 - 1.35 (mg/dL)    Calcium 9.0  8.4 - 10.5 (mg/dL)    GFR calc non Af Amer 47 (*) >90 (mL/min)    GFR calc Af Amer 55 (*) >90 (mL/min)     Physical Findings: AIMS:  , ,  ,  ,    CIWA:    COWS:     Treatment Plan Summary: Daily contact with patient to assess and evaluate symptoms and progress in treatment Medication management  Plan: Continue current treatment plan and no medication changes during this visit.  David [name],David R. 02/24/2012, 11:30 AM

## 2012-02-24 NOTE — Progress Notes (Signed)
[facility] Group Notes:  (Counselor/Nursing/MHT/Case Management/Adjunct)  02/24/2012 11:19 AM  Type of Therapy:  Group Therapy  Participation Level:  Did Not Attend Vanetta Mulders, LPCA    Lucianna Ostlund Garret Reddish 02/24/2012, 11:19 AM

## 2012-02-25 MED ORDER — CHLORDIAZEPOXIDE HCL 25 MG PO CAPS
25.0000 mg | ORAL_CAPSULE | ORAL | Status: DC
  Administered 2012-02-25 – 2012-03-04 (×22): 25 mg via ORAL
  Filled 2012-02-25 (×23): qty 1
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
## 2012-02-25 NOTE — Discharge Planning (Signed)
Met with patient in Aftercare Planning Group.   Had to continually redirect him with respect to his language and talking over the top of others patients.  Eventually had to send him back to his room, as he was responding to unseen people who were making him angry, and his voice was getting louder, more threatening.  Ambrose Mantle, LCSW 02/25/2012, 9:26 AM

## 2012-02-25 NOTE — Progress Notes (Signed)
Pt was pacing the hallway upon first assessment.  He did come to medication window for his morning meds which he took without incident.  He is actively hallucinating while trying to assess him. He was unable to give proper responses to his self-inventory this morning.  He was unable to give a number for his depression, hopelessness or anxiety today.  He does admit to having all but unable to give a number.  He denied any S/H ideations today.  He had to be given prn haldol and vistaril around 1005 for his increased agitation.  The meds seemed to help for a while but by 1330, he was becoming agitated again and was given his scheduled meds at that time.

## 2012-02-25 NOTE — Progress Notes (Signed)
[facility] Group Notes:  (Counselor/Nursing/MHT/Case Management/Adjunct)  02/25/2012 1:56 PM  Type of Therapy:  Group Therapy  Participation Level:  Did Not Attend   Veto Kemps 02/25/2012, 1:56 PM
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
## 2012-02-25 NOTE — Progress Notes (Signed)
Patient ID: David [name], male   DOB: [birth year], 56 y.o.   MRN: [id number] Was in and out of his room this evening, out to the dayroom for awhile and looked like he was watching TV, but had a continuous conversation going with invisible partners.  Seemed to be less threatening to those nearby, conversations were a little quieter, but constantly laced with cursing and other foul language. Is easily angered, didn't come to med window, but went to his room and got into bed. His meds were taken to him, was angered that I had come in with them, initially refused them, cursing, and said it wasn't what he had ordered, said he had asked for "liquid reds," but took them. Remains internally focused, with auditory and visual hallucinations, irritable and can be threatening but has been redirectable tonight..  Will continue to monitor.

## 2012-02-26 MED ORDER — HALOPERIDOL 5 MG PO TABS
20.0000 mg | ORAL_TABLET | Freq: Every day | ORAL | Status: DC
  Administered 2012-02-26 – 2012-03-03 (×7): 20 mg via ORAL
  Filled 2012-02-26 (×8): qty 4

## 2012-02-26 MED ORDER — HALOPERIDOL 5 MG PO TABS
10.0000 mg | ORAL_TABLET | ORAL | Status: DC
  Administered 2012-02-26 – 2012-03-04 (×13): 10 mg via ORAL
  Filled 2012-02-26 (×16): qty 2
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
## 2012-02-26 NOTE — Progress Notes (Signed)
Pt has been asleep since this writer came on shift at 1900.  At med pass, pt was easily awakened.  He was cooperative with Clinical research associate.  He denies SI/HI.  He seems preoccupied, but takes his medication without issue.  He voiced no needs/concerns.  Safety maintained with q15 minute checks.
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
## 2012-02-26 NOTE — Progress Notes (Signed)
[facility] MD Progress Note  02/26/2012 12:38 PM  Diagnosis:  Axis I: Schizophrenia - Paranoid Type.   The patient was seen today and reports the following:   ADL's: Intact.  Sleep: The patient reports to continuing to have significant difficulty initiating and maintaining sleep.  Appetite: The patient reports a good appetite.   Mild>(1-10) >Severe  Hopelessness (1-10): 0  Depression (1-10): 0  Anxiety (1-10): 3   Suicidal Ideation: The patient denies any suicidal ideation today.  Plan: No  Intent: No  Means: No   Homicidal Ideation: The patient denies any homicidal ideations today.  Plan: No  Intent: No.  Means: No   General Appearance/Behavior: Unkempt but with ongoing emotional liability and anger today.  Eye Contact: Fair to Good.  Speech: Appropriate in rate and volume today with no pressuring noted. Motor Behavior: Mild Agitated but improving.  Level of Consciousness: Alert and Oriented x 3.  Mental Status: Alert and Oriented x 3.  Mood: Mildly agitated.  Affect: Mildly Constricted.  Anxiety Level: Mild anxiety reported.  Thought Process: Delusional and psychotic.  Thought Content: The patient reports auditory hallucinations as well as paranoid delusions. He denies any visual hallucinations.  Perception:. Delusional.  Judgment: Poor.  Insight: Poor.  Cognition: Oriented to place and person.  Sleep:  Number of Hours: 6.75    Vital Signs:Blood pressure 105/68, pulse 99, temperature 97.7 F (36.5 C), temperature source Oral, resp. rate 20, height 5\' 10"  (1.778 m), weight 79.379 kg (175 lb).  Current Medications: Current Facility-Administered Medications  Medication Dose Route Frequency Provider Last Rate Last Dose  . acetaminophen (TYLENOL) tablet 650 mg  650 mg Oral Q6H PRN Sanjuana Kava, NP   650 mg at 02/22/12 0801  . alum & mag hydroxide-simeth (MAALOX/MYLANTA) 200-200-20 MG/5ML suspension 30 mL  30 mL Oral Q4H PRN Sanjuana Kava, NP   30 mL at 02/22/12 0000  .  amLODipine (NORVASC) tablet 5 mg  5 mg Oral Daily Franchot Gallo, MD   5 mg at 02/26/12 0731  . benztropine (COGENTIN) tablet 1 mg  1 mg Oral BH-q8a2phs Franchot Gallo, MD   1 mg at 02/26/12 0731  . chlordiazePOXIDE (LIBRIUM) capsule 25 mg  25 mg Oral BH-q8a2phs Viviann Spare, NP   25 mg at 02/26/12 0731  . haloperidol (HALDOL) tablet 10 mg  10 mg Oral BH-q8a2p Tenaya Hilyer, MD      . haloperidol (HALDOL) tablet 20 mg  20 mg Oral QHS Zoeie Ritter, MD      . haloperidol (HALDOL) tablet 5 mg  5 mg Oral Q6H PRN Alyson Kuroski-Mazzei, DO   5 mg at 02/25/12 1005  . hydrOXYzine (ATARAX/VISTARIL) tablet 50 mg  50 mg Oral TID PRN Franchot Gallo, MD   50 mg at 02/25/12 1005  . ibuprofen (ADVIL,MOTRIN) tablet 800 mg  800 mg Oral Q6H PRN Franchot Gallo, MD   800 mg at 02/19/12 0817  . LORazepam (ATIVAN) tablet 2 mg  2 mg Oral Once Viviann Spare, NP      . LORazepam (ATIVAN) tablet 2 mg  2 mg Oral Q6H PRN Mickie D. Adams, PA   2 mg at 02/24/12 1339  . magnesium hydroxide (MILK OF MAGNESIA) suspension 30 mL  30 mL Oral Daily PRN Sanjuana Kava, NP   30 mL at 02/23/12 0038  . nicotine (NICODERM CQ - dosed in mg/24 hours) patch 21 mg  21 mg Transdermal Q0600 Franchot Gallo, MD   21 mg at 02/26/12 1478  .  pantoprazole (PROTONIX) EC tablet 40 mg  40 mg Oral QHS Franchot Gallo, MD   40 mg at 02/25/12 2211  . traZODone (DESYREL) tablet 150 mg  150 mg Oral QHS Franchot Gallo, MD   150 mg at 02/25/12 2211  . DISCONTD: chlordiazePOXIDE (LIBRIUM) capsule 25 mg  25 mg Oral BH-qamhs Viviann Spare, NP   25 mg at 02/25/12 0740  . DISCONTD: haloperidol (HALDOL) tablet 10 mg  10 mg Oral BH-q8a2phs Franchot Gallo, MD   10 mg at 02/26/12 6440   Lab Results: No results found for this or any previous visit (from the past 48 hour(s)).  Time was spent today discussing with the patient his current symptoms. The patient remained more cooperative but easily agitated. He reported ongoing auditory hallucinations and  delusional thinking and them began describing a dart game he recently played. The patient continues to display little insight into his situation.  Treatment Plan Summary:  1. Daily contact with patient to assess and evaluate symptoms and progress in treatment  2. Medication management  3. The patient will deny suicidal ideations or homicidal ideations for 48 hours prior to discharge and have a depression and anxiety rating of 3 or less. The patient will also deny any auditory or visual hallucinations or delusional thinking.   Plan:  1. Will continue current medications. 2. Will increase the medication Haldol to 10 mgs po q am and 2 pm and 20 mgs po qhs for psychosis.  3. Will continue to monitor.  4. Will restart Haldol Decanoate when we determine the appropriate dosage which will be based on his po Haldol dosages.  Gautam Langhorst 02/26/2012, 12:38 PM

## 2012-02-26 NOTE — Progress Notes (Signed)
Pt observed in the hallway during group time.  His thoughts are tangential.  He voices no needs/complaints.  Speak with patient about various subjects that interest him to engage him in conversation.  He uses a lot of curse words during the conversation.  He denies SI/HI.  He has been med compliant on the unit.  Overall he is appropriate and redirectable.  Safety maintained with q15 minute checks.

## 2012-02-27 MED ORDER — DIVALPROEX SODIUM ER 500 MG PO TB24
500.0000 mg | ORAL_TABLET | Freq: Every day | ORAL | Status: DC
  Administered 2012-02-27 – 2012-02-28 (×2): 500 mg via ORAL
  Filled 2012-02-27 (×3): qty 1

## 2012-02-27 NOTE — Progress Notes (Signed)
Recreation Therapy Notes  02/27/2012         Time: 0930      Group Topic/Focus: The focus of the group is on enhancing the patients' ability to cope with stressors by understanding what coping is, why it is important, the negative effects of stress and developing healthier coping skills.  Participation Level: Minimal  Participation Quality: Intrusive  Affect: Irritable   Cognitive: Alert   Additional Comments: Patient continues to talk constantly, but the talking turned to quietly muttering during the relaxation exercise and patient was able to stay in group and not disturb his peers.    Shellby Schlink 02/27/2012 1:35 PM

## 2012-02-27 NOTE — Progress Notes (Signed)
[facility] MD Progress Note  02/27/2012 10:10 AM  Diagnosis:  Axis I: Schizophrenia - Paranoid Type.   The patient was seen today and reports the following:   ADL's: Intact.  Sleep: The patient reports to continuing to have significant difficulty initiating and maintaining sleep.  Appetite: The patient reports a good appetite.   Mild>(1-10) >Severe  Hopelessness (1-10): 0  Depression (1-10): 0  Anxiety (1-10): 3   Suicidal Ideation: The patient again denies any suicidal ideation today.  Plan: No  Intent: No  Means: No   Homicidal Ideation: The patient again denies any homicidal ideations today.  Plan: No  Intent: No.  Means: No   General Appearance/Behavior: Unkempt with ongoing emotional liability and anger today.  The patient balled his fist at provider when he attempted to shake his hand today. Eye Contact: Fair to Good.  Speech: Appropriate in rate and volume today with no pressuring noted.  Motor Behavior: Mild Agitated continues.  Level of Consciousness: Alert and Oriented x 3.  Mental Status: Alert and Oriented x 3.  Mood: Mildly agitated.  Affect: Mildly Constricted.  Anxiety Level: Mild anxiety reported.  Thought Process: Delusional and psychotic.  Thought Content: The patient reports ongoing auditory hallucinations as well as paranoid delusions. He denies any visual hallucinations.  Perception:. Delusional.  Judgment: Poor.  Insight: Poor.  Cognition: Oriented to place and person.  Sleep:  Number of Hours: 2.25    Vital Signs:Blood pressure 110/71, pulse 101, temperature 97.4 F (36.3 C), temperature source Oral, resp. rate 18, height 5\' 10"  (1.778 m), weight 79.379 kg (175 lb).  Current Medications: Current Facility-Administered Medications  Medication Dose Route Frequency Provider Last Rate Last Dose  . acetaminophen (TYLENOL) tablet 650 mg  650 mg Oral Q6H PRN Sanjuana Kava, NP   650 mg at 02/22/12 0801  . alum & mag hydroxide-simeth (MAALOX/MYLANTA)  200-200-20 MG/5ML suspension 30 mL  30 mL Oral Q4H PRN Sanjuana Kava, NP   30 mL at 02/26/12 1352  . amLODipine (NORVASC) tablet 5 mg  5 mg Oral Daily Franchot Gallo, MD   5 mg at 02/27/12 0802  . benztropine (COGENTIN) tablet 1 mg  1 mg Oral BH-q8a2phs Franchot Gallo, MD   1 mg at 02/27/12 0802  . chlordiazePOXIDE (LIBRIUM) capsule 25 mg  25 mg Oral BH-q8a2phs Viviann Spare, NP   25 mg at 02/27/12 0802  . divalproex (DEPAKOTE ER) 24 hr tablet 500 mg  500 mg Oral QHS Javia Dillow, MD      . haloperidol (HALDOL) tablet 10 mg  10 mg Oral BH-q8a2p Yobana Culliton, MD   10 mg at 02/27/12 0802  . haloperidol (HALDOL) tablet 20 mg  20 mg Oral QHS Caelen Reierson, MD   20 mg at 02/26/12 2132  . haloperidol (HALDOL) tablet 5 mg  5 mg Oral Q6H PRN Alyson Kuroski-Mazzei, DO   5 mg at 02/25/12 1005  . hydrOXYzine (ATARAX/VISTARIL) tablet 50 mg  50 mg Oral TID PRN Franchot Gallo, MD   50 mg at 02/25/12 1005  . ibuprofen (ADVIL,MOTRIN) tablet 800 mg  800 mg Oral Q6H PRN Franchot Gallo, MD   800 mg at 02/27/12 0224  . LORazepam (ATIVAN) tablet 2 mg  2 mg Oral Once Viviann Spare, NP      . LORazepam (ATIVAN) tablet 2 mg  2 mg Oral Q6H PRN Mickie D. Adams, PA   2 mg at 02/24/12 1339  . magnesium hydroxide (MILK OF MAGNESIA) suspension 30 mL  30 mL Oral Daily PRN Sanjuana Kava, NP   30 mL at 02/23/12 0038  . nicotine (NICODERM CQ - dosed in mg/24 hours) patch 21 mg  21 mg Transdermal Q0600 Franchot Gallo, MD   21 mg at 02/26/12 1610  . pantoprazole (PROTONIX) EC tablet 40 mg  40 mg Oral QHS Franchot Gallo, MD   40 mg at 02/26/12 2133  . traZODone (DESYREL) tablet 150 mg  150 mg Oral QHS Franchot Gallo, MD   150 mg at 02/26/12 2132  . DISCONTD: haloperidol (HALDOL) tablet 10 mg  10 mg Oral BH-q8a2phs Franchot Gallo, MD   10 mg at 02/26/12 9604   Lab Results: No results found for this or any previous visit (from the past 48 hour(s)).  Time was spent today discussing with the patient his current symptoms. The  patient reported ongoing auditory hallucinations and delusional thinking then stated "it is none of your business."  When this provider attempted to shake the patient's hand at the end of the interview, he balled up his fist and stated "don't touch me."  The patient continues to display no insight into his situation.  Treatment Plan Summary:  1. Daily contact with patient to assess and evaluate symptoms and progress in treatment  2. Medication management  3. The patient will deny suicidal ideations or homicidal ideations for 48 hours prior to discharge and have a depression and anxiety rating of 3 or less. The patient will also deny any auditory or visual hallucinations or delusional thinking.   Plan:  1. Will continue current medications.  2. Will start the medication Depakote ER at 500 mgs po qhs to provide further mood stabilization.  3. Will continue to monitor.  4. Will restart Haldol Decanoate when we determine the appropriate dosage which will be based on his po Haldol dosages.   Deshauna Cayson 02/27/2012, 10:10 AM

## 2012-02-27 NOTE — Progress Notes (Signed)
Pt is labile but has been cooperative. Pt gets irritated easily. Pt does attend groups and interacts well with peers. Pt was offered support and encouragement. Pt safety maintained on unit.
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
## 2012-02-27 NOTE — Progress Notes (Signed)
Pt. Now reports AVH, "tell Dr. Allena Katz I new dilaudid, OxyContin" "I need a lot of them it a lot of men." Pt. Can be redirected, attended group was coherent talked about him and mom taking a trip to [place]. Staff will continue to monitor q15 min for safety.

## 2012-02-28 NOTE — Discharge Planning (Signed)
Met with patient in Aftercare Planning Group.   He was more pleasant today, with improved eye contact and actually smiled numerous times during group.  He did not have to be redirected today from interrupting others or from cursing.  He still was tangential and disconnected from group topics, but otherwise seemed to be more aware of the other people in the group.  He continued to make bizarre motions with his arms at times.  No case management needs today.  Ambrose Mantle, LCSW 02/28/2012, 1:35 PM

## 2012-02-28 NOTE — Progress Notes (Signed)
Patient ID: David [name], male   DOB: [birth year], 56 y.o.   MRN: [id number]  I interview Jodelle Red [facility]) in his room.  He initially greeted me in the hall with good eye contact and a smile, and this is a big improvement for him.  His manner is much calmer with no spontaneous outbursts or cursing/expletives as he has been doing.  He is eating a nutrition bar and says his appetite is good.  Says he slept well.   Still has some irrelevant responses, but no dangerous ideas.  No evidence of aggression.  Staff report his is calmer with no physical or verbal aggression.  At one point he says he is hearing some voices - then later tells me they are not bothering him today.  Thinking is still disorganized but with episodes of logic.  He is able to tell me that his brother visited with him and it was a good visit - searches to tell me what day - not sure what day this is - then tells me it was just a 'few' days ago.  This is a big improvement to be able to express an idea logically. Affect is relaxed, manner non guarded.  He is polite.    Plan:  Will continue Depakote and current other medications.

## 2012-02-28 NOTE — Progress Notes (Signed)
[facility] Group Notes:  (Counselor/Nursing/MHT/Case Management/Adjunct)  02/28/2012 1:19 PM  Type of Therapy:  Group Therapy  Participation Level:  Minimal  Participation Quality:  Attentive and Intrusive  Affect:  Labile  Cognitive:  Confused  Insight:  None  Engagement in Group:  Limited  Engagement in Therapy:  Limited  Modes of Intervention:  Limit-setting and Support  Summary of Progress/Problems: Patient was more cooperative today. Not as angry, but continued to talk to himself and make gestures in the air. When attempts were made to redirect, he became angry. At other times patient was smiling and reacting in a positive way. Talked about wanting to see Dr. Betti Cruz when he leaves.   Destin Vinsant, Aram Beecham 02/28/2012, 1:19 PM

## 2012-02-28 NOTE — Progress Notes (Signed)
[facility] Group Notes:  (Counselor/Nursing/MHT/Case Management/Adjunct)  02/28/2012 1:16 PM  Type of Therapy:  Group Therapy 02/26/2012  Participation Level:  Did Not Attend   Veto Kemps 02/28/2012, 1:16 PM

## 2012-02-28 NOTE — Progress Notes (Signed)
Pt had no complaints. Pt was rather silly today but pleasant. Pt can be labile at times but is easily redirected. Pt attends groups. Pt was offered support and encouragement. Pt receptive to treatment and safety maintained on unit.

## 2012-02-28 NOTE — Progress Notes (Signed)
[facility] Group Notes:  (Counselor/Nursing/MHT/Case Management/Adjunct)  02/28/2012 1:17 PM  Type of Therapy:  Psychoeducational Skills  Participation Level:  Minimal  Participation Quality:  Intrusive  Affect:  Labile  Cognitive:  Confused  Insight:  None  Engagement in Group:  Limited  Engagement in Therapy:  Limited  Modes of Intervention:  Education and Limit-setting  Summary of Progress/Problems: Patient was present with speaker from mental health association, but kept interrupting speaker with illogical and unrelated conversation.    Torsha Lemus, Aram Beecham 02/28/2012, 1:17 PM

## 2012-02-29 MED ORDER — DIVALPROEX SODIUM ER 500 MG PO TB24
1000.0000 mg | ORAL_TABLET | Freq: Every day | ORAL | Status: DC
  Administered 2012-02-29: 1000 mg via ORAL
  Filled 2012-02-29 (×4): qty 2

## 2012-02-29 NOTE — Tx Team (Signed)
Interdisciplinary Treatment Plan Update (Adult)  Date:  02/29/2012  Time Reviewed:  10:15AM-11:00AM  Progress in Treatment: Attending groups:  Yes Participating in groups:    Not in meaningful way, mostly tangential but more cooperative, not cursing or intrusive now Taking medication as prescribed:    Yes Tolerating medication:   Yes Family/Significant other contact made:  Yes Patient understands diagnosis:   Yes, with poor insight and judgment Discussing patient identified problems/goals with staff:   No Medical problems stabilized or resolved:   Yes Denies suicidal/homicidal ideation:  Yes Issues/concerns per patient self-inventory:   Appetite is down Other:    New problem(s) identified: No, Describe:    Reason for Continuation of Hospitalization: Delusions  Medication stabilization  Interventions implemented related to continuation of hospitalization:  Medication monitoring and adjustment, safety checks Q15 min., suicide risk assessment, group therapy, psychoeducation, collateral contact, aftercare planning, ongoing physician assessments, medication education  Additional comments:  Not applicable  Estimated length of stay:  3-5 days  Discharge Plan:  Return to live with brother and his family, follow up with [facility]  New goal(s):  Not applicable  Review of initial/current patient goals per problem list:   1.  Goal(s):  Eliminate aggressive behaviors  Met:  Yes  Target date:  By Discharge   As evidenced by:  No longer displaying any aggression  2.  Goal(s):  Reduce psychotic symptoms (hallucinations) to baseline.  Met:  No  Target date:  By Discharge   As evidenced by:  Asks today to be d/c'd so can go start his job in [place]; often talks of delusion things  3.  Goal(s):  Reduce paranoia to baseline.  Met:  No  Target date:  By Discharge   As evidenced by:  Less paranoid, but still remains symptomatic  4.  Goal(s):  Deny SI/HI for 48 hours prior to  D/C.  Met:  Yes  Target date:  By Discharge   As evidenced by:  Denies all SI  Attendees: Patient:     Family:     Physician:  Dr. Harvie Heck Readling 02/29/2012 10:15AM-11:00AM  Nursing:   Omelia Blackwater, RN 02/29/2012 10:15AM -11:00AM   Case Manager:  Ambrose Mantle, LCSW 02/29/2012 10:15AM-11:00AM  Counselor:  Veto Kemps, MT-BC 02/29/2012 10:15AM-11:00AM  Other:      Other:      Other:      Other:       Scribe for Treatment Team:   Sarina Ser, 02/29/2012, 10:15AM-11:15AM

## 2012-02-29 NOTE — Progress Notes (Signed)
Patient ID: David [name], male   DOB: [birth year], 56 y.o.   MRN: [id number] Pt. Reports voices "every now and then"  Pt. Denies SHI. Pt. Interacts with staff and other clients. Staff will continue to monitor q22min for safety.

## 2012-02-29 NOTE — Discharge Planning (Signed)
Met with patient in Aftercare Planning Group.   He was pleasant but sleepy, disheveled.  When asked how he is doing several different times during group and asked to contribute some thoughts, he spoke about other topics but not the one we were actually covering.  Remains not based in reality.  Did utilization review for additional days.  Ambrose Mantle, LCSW 02/29/2012, 4:09 PM

## 2012-02-29 NOTE — Progress Notes (Signed)
[facility] MD Progress Note  02/29/2012 2:06 PM  Diagnosis:  Axis I: Schizophrenia - Paranoid Type.   The patient was seen today and reports the following:   ADL's: Intact.  Sleep: The patient reports to continuing to have significant difficulty initiating and maintaining sleep but was able to sleep approximately 4 hours last night.  Appetite: The patient reports a poor appetite.   Mild>(1-10) >Severe  Hopelessness (1-10): 0  Depression (1-10): 4  Anxiety (1-10): 3   Suicidal Ideation: The patient denies any suicidal ideation today.  Plan: No  Intent: No  Means: No  Homicidal Ideation: The patient denies any homicidal ideations today.  Plan: No  Intent: No.  Means: No  General Appearance/Behavior: Unkempt with ongoing emotional liability which is improving.  Eye Contact: Fair.  Speech: Appropriate in rate and volume today with no pressuring noted.  Motor Behavior: Mild agitated noted.  Level of Consciousness: Alert and Oriented x 3.  Mental Status: Alert and Oriented x 3.  Mood: Mildly agitated.  Affect: Mild to Moderately Constricted.  Anxiety Level: Mild anxiety reported.  Thought Process: Delusional and psychotic.  Thought Content: The patient reports ongoing auditory hallucinations as well as paranoid delusions. He denies any visual hallucinations.  Perception:. Delusional.  Judgment: Poor.  Insight: Poor.  Cognition: Oriented to place and person.  Sleep:  Number of Hours: 4    Vital Signs:Blood pressure 127/84, pulse 109, temperature 97.4 F (36.3 C), temperature source Oral, resp. rate 17, height 5\' 10"  (1.778 m), weight 79.379 kg (175 lb).  Current Medications: Current Facility-Administered Medications  Medication Dose Route Frequency Provider Last Rate Last Dose  . acetaminophen (TYLENOL) tablet 650 mg  650 mg Oral Q6H PRN Sanjuana Kava, NP   650 mg at 02/28/12 1005  . alum & mag hydroxide-simeth (MAALOX/MYLANTA) 200-200-20 MG/5ML suspension 30 mL  30 mL Oral Q4H PRN  Sanjuana Kava, NP   30 mL at 02/29/12 0109  . amLODipine (NORVASC) tablet 5 mg  5 mg Oral Daily Franchot Gallo, MD   5 mg at 02/29/12 1021  . benztropine (COGENTIN) tablet 1 mg  1 mg Oral BH-q8a2phs Franchot Gallo, MD   1 mg at 02/29/12 1313  . chlordiazePOXIDE (LIBRIUM) capsule 25 mg  25 mg Oral BH-q8a2phs Viviann Spare, NP   25 mg at 02/29/12 1313  . divalproex (DEPAKOTE ER) 24 hr tablet 1,000 mg  1,000 mg Oral QHS Taralee Marcus, MD      . haloperidol (HALDOL) tablet 10 mg  10 mg Oral BH-q8a2p Senon Nixon, MD   10 mg at 02/29/12 1313  . haloperidol (HALDOL) tablet 20 mg  20 mg Oral QHS Taheem Fricke, MD   20 mg at 02/28/12 2153  . haloperidol (HALDOL) tablet 5 mg  5 mg Oral Q6H PRN Alyson Kuroski-Mazzei, DO   5 mg at 02/25/12 1005  . hydrOXYzine (ATARAX/VISTARIL) tablet 50 mg  50 mg Oral TID PRN Franchot Gallo, MD   50 mg at 02/25/12 1005  . ibuprofen (ADVIL,MOTRIN) tablet 800 mg  800 mg Oral Q6H PRN Franchot Gallo, MD   800 mg at 02/27/12 0224  . LORazepam (ATIVAN) tablet 2 mg  2 mg Oral Once Viviann Spare, NP      . LORazepam (ATIVAN) tablet 2 mg  2 mg Oral Q6H PRN Mickie D. Adams, PA   2 mg at 02/24/12 1339  . magnesium hydroxide (MILK OF MAGNESIA) suspension 30 mL  30 mL Oral Daily PRN Sanjuana Kava, NP  30 mL at 02/23/12 0038  . nicotine (NICODERM CQ - dosed in mg/24 hours) patch 21 mg  21 mg Transdermal Q0600 Franchot Gallo, MD   21 mg at 02/29/12 4540  . pantoprazole (PROTONIX) EC tablet 40 mg  40 mg Oral QHS Franchot Gallo, MD   40 mg at 02/28/12 2152  . traZODone (DESYREL) tablet 150 mg  150 mg Oral QHS Franchot Gallo, MD   150 mg at 02/28/12 2154  . DISCONTD: divalproex (DEPAKOTE ER) 24 hr tablet 500 mg  500 mg Oral QHS Franchot Gallo, MD   500 mg at 02/28/12 2154   Lab Results: No results found for this or any previous visit (from the past 48 hour(s)).  The patient continues to appear psychotic with mild agitation.  He continues to report that he has a job to do in  [place] and needs his passport in order to travel.  The patient does appear to be less agitated with the addition of the medication Depakote ER.  Treatment Plan Summary:  1. Daily contact with patient to assess and evaluate symptoms and progress in treatment  2. Medication management  3. The patient will deny suicidal ideations or homicidal ideations for 48 hours prior to discharge and have a depression and anxiety rating of 3 or less. The patient will also deny any auditory or visual hallucinations or delusional thinking.   Plan:  1. Will continue current medications.  2. Will increase the medication Depakote ER to 1000 mgs po qhs to provide further mood stabilization.  3. Will continue to monitor.  4. Labs ordered including CMP, CBC with Diff and Serum Depakote Level.  Lorriane Dehart 02/29/2012, 2:06 PM

## 2012-02-29 NOTE — Progress Notes (Signed)
02/29/2012         Time: 1125      Group Topic/Focus: The focus of the group is on enhancing the patients' ability to cope with stressors by understanding what coping is, why it is important, the negative effects of stress and developing healthier coping skills. Patients practice [place] and discuss how exercise can be used as a healthy coping strategy.  Participation Level: Minimal  Participation Quality: Intrusive  Affect: Blunted  Cognitive: Confused  Additional Comments: Patient remains intrusive, but less so than in previous groups. Patient made statements about defecting to [place].   Mateya Torti 02/29/2012 12:54 PM

## 2012-02-29 NOTE — Progress Notes (Signed)
Pt has no depression or anxiety. Pt is in good spirits today. Pt continues to go off on tangents and get fixated on random flight of ideas. Pt attends groups. Pt was offered support and encouragement. Pt safety maintained on unit.

## 2012-02-29 NOTE — Progress Notes (Signed)
Checked on pt and it seems that he has edema in both feet and a little on the ankles. Pt right foot has more pitting edema than the left. PT was asked that when he was in bed to elevate his feet on pillows.

## 2012-03-01 LAB — DIFFERENTIAL
Basophils Absolute: 0.1 10*3/uL (ref 0.0–0.1)
Basophils Relative: 0 % (ref 0–1)
Eosinophils Absolute: 0.9 10*3/uL — ABNORMAL HIGH (ref 0.0–0.7)
Monocytes Absolute: 1.3 10*3/uL — ABNORMAL HIGH (ref 0.1–1.0)
Neutro Abs: 12.3 10*3/uL — ABNORMAL HIGH (ref 1.7–7.7)
Neutrophils Relative %: 78 % — ABNORMAL HIGH (ref 43–77)

## 2012-03-01 LAB — COMPREHENSIVE METABOLIC PANEL
ALT: 11 U/L (ref 0–53)
Alkaline Phosphatase: 73 U/L (ref 39–117)
CO2: 29 mEq/L (ref 19–32)
Chloride: 101 mEq/L (ref 96–112)
GFR calc Af Amer: 66 mL/min — ABNORMAL LOW (ref >90)
GFR calc non Af Amer: 57 mL/min — ABNORMAL LOW (ref >90)
Glucose, Bld: 124 mg/dL — ABNORMAL HIGH (ref 70–99)
Potassium: 4 mEq/L (ref 3.5–5.1)
Sodium: 135 mEq/L (ref 135–145)

## 2012-03-01 LAB — CBC
MCH: 27.2 pg (ref 26.0–34.0)
MCHC: 32 g/dL (ref 30.0–36.0)
RDW: 16.3 % — ABNORMAL HIGH (ref 11.5–15.5)

## 2012-03-01 NOTE — Progress Notes (Signed)
Patient ID: David [name], male   DOB: 1956-10-17, 56 y.o.   MRN: [id number] Was out to the dayroom and sat down for snack, appeared to briefly watch tv but got up and went back to his room.  Didn't come to med window, meds were taken to him and he refused depakote.  Started to become agitated when tried to encourage him to take it.  He said it makes him sick at his stomach. Will continue to monitor.

## 2012-03-01 NOTE — Progress Notes (Signed)
Pt is irritable and pacing however he has improved much from last weekend  He continues to be paranoid intrusive and illogical  In his thinking   He is unable to make sentences on his self inventory but did make an effort to write down something which last weekend he did not even fill the paper out   He also filled in the date as march of 2001  Verbal support given  Medications administered and effectiveness monitored   Redirect as needed and provide guidance in reality orientation  Q 15 min checks   Pt safe at present

## 2012-03-01 NOTE — Progress Notes (Addendum)
Patient ID: David [name], male   DOB: [birth year], 56 y.o.   MRN: [id number] [facility] MD Progress Note  03/01/2012 7:35 PM  Diagnosis:  Axis I: Schizophrenia - Paranoid Type.   The patient was seen today. Not able to tell why he is here. Very disorganized. Able to talk but it did not make any sense most of the time.  He also reports the following:   ADL's: Intact.  Sleep: good per pt. Appetite: The patient reports a poor appetite.   Mild>(1-10) >Severe  Hopelessness (1-10): 0  Depression (1-10): 3 Anxiety (1-10): 3   Suicidal Ideation: The patient denies any suicidal ideation today.  Plan: No  Intent: No  Means: No  Homicidal Ideation: The patient denies any homicidal ideations today.  Plan: No  Intent: No.  Means: No  General Appearance/Behavior: Unkempt with ongoing emotional liability which is improving.  Eye Contact: Fair.  Speech: Appropriate in rate and volume today with no pressuring noted.  Motor Behavior: Mild agitated noted.  Level of Consciousness: Alert and Oriented to only person today  Mental Status: Alert and Oriented x 3.  Mood: Mildly agitated.  Affect: flat Anxiety Level: Mild anxiety reported.  Thought Process: Delusional and psychotic.  Thought Content: The patient reports ongoing auditory hallucinations as well as paranoid delusions. He denies any visual hallucinations.  Perception:. Delusional.  Judgment: Poor.  Insight: Poor.  Cognition: Oriented to place and person.  Sleep:  Number of Hours: 6.25    Vital Signs:Blood pressure 109/72, pulse 112, temperature 97.4 F (36.3 C), temperature source Oral, resp. rate 18, height 5\' 10"  (1.778 m), weight 79.379 kg (175 lb).  Current Medications: Current Facility-Administered Medications  Medication Dose Route Frequency Provider Last Rate Last Dose  . acetaminophen (TYLENOL) tablet 650 mg  650 mg Oral Q6H PRN Sanjuana Kava, NP   650 mg at 02/28/12 1005  . alum & mag hydroxide-simeth (MAALOX/MYLANTA) 200-200-20  MG/5ML suspension 30 mL  30 mL Oral Q4H PRN Sanjuana Kava, NP   30 mL at 03/01/12 0842  . amLODipine (NORVASC) tablet 5 mg  5 mg Oral Daily Franchot Gallo, MD   5 mg at 03/01/12 0839  . benztropine (COGENTIN) tablet 1 mg  1 mg Oral BH-q8a2phs Franchot Gallo, MD   1 mg at 02/29/12 2208  . chlordiazePOXIDE (LIBRIUM) capsule 25 mg  25 mg Oral BH-q8a2phs Viviann Spare, NP   25 mg at 03/01/12 1339  . divalproex (DEPAKOTE ER) 24 hr tablet 1,000 mg  1,000 mg Oral QHS Randy Readling, MD   1,000 mg at 02/29/12 2209  . haloperidol (HALDOL) tablet 10 mg  10 mg Oral BH-q8a2p Randy Readling, MD   10 mg at 03/01/12 1339  . haloperidol (HALDOL) tablet 20 mg  20 mg Oral QHS Franchot Gallo, MD   20 mg at 02/29/12 2211  . haloperidol (HALDOL) tablet 5 mg  5 mg Oral Q6H PRN Alyson Kuroski-Mazzei, DO   5 mg at 02/25/12 1005  . hydrOXYzine (ATARAX/VISTARIL) tablet 50 mg  50 mg Oral TID PRN Franchot Gallo, MD   50 mg at 02/25/12 1005  . ibuprofen (ADVIL,MOTRIN) tablet 800 mg  800 mg Oral Q6H PRN Franchot Gallo, MD   800 mg at 02/27/12 0224  . LORazepam (ATIVAN) tablet 2 mg  2 mg Oral Once Viviann Spare, NP      . LORazepam (ATIVAN) tablet 2 mg  2 mg Oral Q6H PRN Mickie D. Adams, PA   2 mg at 02/24/12 1339  .  magnesium hydroxide (MILK OF MAGNESIA) suspension 30 mL  30 mL Oral Daily PRN Sanjuana Kava, NP   30 mL at 02/23/12 0038  . nicotine (NICODERM CQ - dosed in mg/24 hours) patch 21 mg  21 mg Transdermal Q0600 Franchot Gallo, MD   21 mg at 03/01/12 0709  . pantoprazole (PROTONIX) EC tablet 40 mg  40 mg Oral QHS Franchot Gallo, MD   40 mg at 02/29/12 2208  . traZODone (DESYREL) tablet 150 mg  150 mg Oral QHS Franchot Gallo, MD   150 mg at 02/29/12 2211   Lab Results: No results found for this or any previous visit (from the past 48 hour(s)).      Plan:   1. Will continue current medications.  .  2. Will continue to monitor.  Wonda Cerise 03/01/2012, 7:35 PM

## 2012-03-01 NOTE — Progress Notes (Signed)
Patient ID: David [name], male   DOB: 03/13/1956, 56 y.o.   MRN: [id number]  [facility] Group Notes:  (Counselor/Nursing/MHT/Case Management/Adjunct)  03/01/2012 11 AM  Type of Therapy:  Group Therapy, Dance/Movement Therapy   Participation Level:  Minimal  Participation Quality:  Attentive  Affect:  Appropriate  Cognitive:  Oriented  Insight:  Limited  Engagement in Group:  Limited  Engagement in Therapy:  Limited  Modes of Intervention:  Clarification, Problem-solving, Role-play, Socialization and Support  Summary of Progress/Problems: Therapist discussed the meaning of self sabotaging behaviors. Group discussed what self sabotage means to them and ways to prevent sabotaging behaviors in recovery.  Pt. stated that self sabotage means "not doing well and being shaky".  Pt. Did not want to discuss a preventative way of sabotaging recovery.        Rhunette Croft

## 2012-03-01 NOTE — Progress Notes (Signed)
Patient ID: David [name], male   DOB: [birth year], 56 y.o.   MRN: [id number] Pt. attended and participated in aftercare planning group. Pt. accepted information on suicide prevention, warning signs to look for with suicide and crisis line numbers to use. The pt. agreed to call crisis line numbers if having warning signs or having thoughts of suicide. Pt. listed their current anxiety level as above 10nd on a scale of 1-10 with 10 as the high.  Pt. has no HI and SI.

## 2012-03-02 NOTE — Progress Notes (Signed)
Pt is discheveled with poor hygiene  He continues to try to refuse his medications but does listen to teaching and did finally take all the medicine except for cogentin  He paces the hallway and has limited interaction with others  He said he slept ok last nignt  He seems calmer but continues to voice delusional beliefs  He is also responding to internal stimuli and endorses voices   Verbal support given  Medications administered and effectiveness monitored  Q 15 min checks  Pt safe at present

## 2012-03-02 NOTE — Progress Notes (Signed)
Patient ID: David [name], male   DOB: September 22, 1956, 56 y.o.   MRN: [id number] [facility] MD Progress Note  03/02/2012 11:05 PM  Diagnosis:  Axis I: Schizophrenia - Paranoid Type.   The patient was seen today. Not able to give his name and tell why he is here. Very disorganized. Able to talk but it did not make any sense most of the time.  He also reports the following:   ADL's: Intact.  Sleep: good per pt. Appetite: The patient reports a poor appetite.   Mild>(1-10) >Severe  Hopelessness (1-10): 0  Depression (1-10): 3 Anxiety (1-10): 3   Suicidal Ideation: The patient denies any suicidal ideation today.  Plan: No  Intent: No  Means: No  Homicidal Ideation: The patient denies any homicidal ideations today.  Plan: No  Intent: No.  Means: No  General Appearance/Behavior: Unkempt with ongoing emotional liability which is improving.  Eye Contact: Fair.  Speech: Appropriate in rate and volume today with no pressuring noted.  Motor Behavior: Mild agitated noted.  Level of Consciousness: Alert and Oriented to only person today  Mental Status: Alert and Oriented x 3.  Mood: Mildly agitated.  Affect: flat Anxiety Level: Mild anxiety reported.  Thought Process: Delusional and psychotic.  Thought Content: The patient reports ongoing auditory hallucinations as well as paranoid delusions. He denies any visual hallucinations.  Perception:. Delusional.  Judgment: Poor.  Insight: Poor.  Cognition: Oriented to place and person.  Sleep:  Number of Hours: 5.75    Vital Signs:Blood pressure 144/76, pulse 98, temperature 98.4 F (36.9 C), temperature source Oral, resp. rate 20, height 5\' 10"  (1.778 m), weight 79.379 kg (175 lb).  Current Medications: Current Facility-Administered Medications  Medication Dose Route Frequency Provider Last Rate Last Dose  . acetaminophen (TYLENOL) tablet 650 mg  650 mg Oral Q6H PRN Sanjuana Kava, NP   650 mg at 02/28/12 1005  . alum & mag hydroxide-simeth  (MAALOX/MYLANTA) 200-200-20 MG/5ML suspension 30 mL  30 mL Oral Q4H PRN Sanjuana Kava, NP   30 mL at 03/01/12 2215  . amLODipine (NORVASC) tablet 5 mg  5 mg Oral Daily Franchot Gallo, MD   5 mg at 03/02/12 0819  . benztropine (COGENTIN) tablet 1 mg  1 mg Oral BH-q8a2phs Franchot Gallo, MD   1 mg at 03/02/12 2254  . chlordiazePOXIDE (LIBRIUM) capsule 25 mg  25 mg Oral BH-q8a2phs Viviann Spare, NP   25 mg at 03/02/12 2253  . divalproex (DEPAKOTE ER) 24 hr tablet 1,000 mg  1,000 mg Oral QHS Randy Readling, MD   1,000 mg at 02/29/12 2209  . haloperidol (HALDOL) tablet 10 mg  10 mg Oral BH-q8a2p Randy Readling, MD   10 mg at 03/02/12 0820  . haloperidol (HALDOL) tablet 20 mg  20 mg Oral QHS Randy Readling, MD   20 mg at 03/02/12 2254  . haloperidol (HALDOL) tablet 5 mg  5 mg Oral Q6H PRN Alyson Kuroski-Mazzei, DO   5 mg at 02/25/12 1005  . hydrOXYzine (ATARAX/VISTARIL) tablet 50 mg  50 mg Oral TID PRN Franchot Gallo, MD   50 mg at 02/25/12 1005  . ibuprofen (ADVIL,MOTRIN) tablet 800 mg  800 mg Oral Q6H PRN Franchot Gallo, MD   800 mg at 02/27/12 0224  . LORazepam (ATIVAN) tablet 2 mg  2 mg Oral Once Viviann Spare, NP      . LORazepam (ATIVAN) tablet 2 mg  2 mg Oral Q6H PRN Mickie D. Pernell Dupre, PA   2  mg at 02/24/12 1339  . magnesium hydroxide (MILK OF MAGNESIA) suspension 30 mL  30 mL Oral Daily PRN Sanjuana Kava, NP   30 mL at 02/23/12 0038  . nicotine (NICODERM CQ - dosed in mg/24 hours) patch 21 mg  21 mg Transdermal Q0600 Franchot Gallo, MD   21 mg at 03/02/12 0612  . pantoprazole (PROTONIX) EC tablet 40 mg  40 mg Oral QHS Franchot Gallo, MD   40 mg at 03/02/12 2255  . traZODone (DESYREL) tablet 150 mg  150 mg Oral QHS Franchot Gallo, MD   150 mg at 03/02/12 2253   Lab Results:  Results for orders placed during the hospital encounter of 02/18/12 (from the past 48 hour(s))  COMPREHENSIVE METABOLIC PANEL     Status: Abnormal   Collection Time   03/01/12  8:10 PM      Component Value Range Comment     Sodium 135  135 - 145 (mEq/L)    Potassium 4.0  3.5 - 5.1 (mEq/L)    Chloride 101  96 - 112 (mEq/L)    CO2 29  19 - 32 (mEq/L)    Glucose, Bld 124 (*) 70 - 99 (mg/dL)    BUN 16  6 - 23 (mg/dL)    Creatinine, Ser 7.82 (*) 0.50 - 1.35 (mg/dL)    Calcium 8.8  8.4 - 10.5 (mg/dL)    Total Protein 6.0  6.0 - 8.3 (g/dL)    Albumin 2.8 (*) 3.5 - 5.2 (g/dL)    AST 12  0 - 37 (U/L)    ALT 11  0 - 53 (U/L)    Alkaline Phosphatase 73  39 - 117 (U/L)    Total Bilirubin 0.2 (*) 0.3 - 1.2 (mg/dL)    GFR calc non Af Amer 57 (*) >90 (mL/min)    GFR calc Af Amer 66 (*) >90 (mL/min)   CBC     Status: Abnormal   Collection Time   03/01/12  8:10 PM      Component Value Range Comment   WBC 15.8 (*) 4.0 - 10.5 (K/uL)    RBC 3.79 (*) 4.22 - 5.81 (MIL/uL)    Hemoglobin 10.3 (*) 13.0 - 17.0 (g/dL)    HCT 95.6 (*) 21.3 - 52.0 (%)    MCV 85.0  78.0 - 100.0 (fL)    MCH 27.2  26.0 - 34.0 (pg)    MCHC 32.0  30.0 - 36.0 (g/dL)    RDW 08.6 (*) 57.8 - 15.5 (%)    Platelets 208  150 - 400 (K/uL)   DIFFERENTIAL     Status: Abnormal   Collection Time   03/01/12  8:10 PM      Component Value Range Comment   Neutrophils Relative 78 (*) 43 - 77 (%)    Neutro Abs 12.3 (*) 1.7 - 7.7 (K/uL)    Lymphocytes Relative 8 (*) 12 - 46 (%)    Lymphs Abs 1.3  0.7 - 4.0 (K/uL)    Monocytes Relative 8  3 - 12 (%)    Monocytes Absolute 1.3 (*) 0.1 - 1.0 (K/uL)    Eosinophils Relative 6 (*) 0 - 5 (%)    Eosinophils Absolute 0.9 (*) 0.0 - 0.7 (K/uL)    Basophils Relative 0  0 - 1 (%)    Basophils Absolute 0.1  0.0 - 0.1 (K/uL)   VALPROIC ACID LEVEL     Status: Abnormal   Collection Time   03/01/12  8:10 PM  Component Value Range Comment   Valproic Acid Lvl 10.8 (*) 50.0 - 100.0 (ug/mL)      Plan:   1. Will continue current medications.  .  2. Will continue to monitor.  Theotis Barrio, Livingston Diones 03/02/2012, 11:05 PM

## 2012-03-02 NOTE — Progress Notes (Signed)
Patient ID: David [name], male   DOB: [birth year], 56 y.o.   MRN: [id number]  [facility] Group Notes:  (Counselor/Nursing/MHT/Case Management/Adjunct)  03/02/2012 11 AM  Type of Therapy:  Group Therapy, Dance/Movement Therapy   Participation Level:  Minimal  Participation Quality:  Drowsy  Affect:  Blunted  Cognitive:  Confused  Insight:  Limited  Engagement in Group:  Limited  Engagement in Therapy:  Limited  Modes of Intervention:  Clarification, Problem-solving, Role-play, Socialization and Support  Summary of Progress/Problems:  Therapist discussed the definition of supports.  Therapist asked group to describe their idea of support.  Patient stated that support means, " getting paid my money".      Rhunette Croft

## 2012-03-02 NOTE — Progress Notes (Addendum)
Patient ID: David [name], male   DOB: Dec 03, 1956, 56 y.o.   MRN: [id number] Has been out and about on the hall this evening, stated he felt like he might be coming down with a cold, was coughing at times.  Seemed a little more focused on conversation, but still internally focused and paces frequently, seems to be talking to himself a little less than last week. Refused to take depakote again this evening, stated it makes him sick at his stomach.  Was compliant with his other meds, however.  Will continue to monitor.

## 2012-03-02 NOTE — Progress Notes (Signed)
Patient ID: David [name], male   DOB: Mar 01, 1956, 56 y.o.   MRN: [id number] Pt. attended and participated in aftercare planning group. Pt. accepted information on suicide prevention, warning signs to look for with suicide and crisis line numbers to use. The pt. agreed to call crisis line numbers if having warning signs or having thoughts of suicide. Pt. listed their current anxiety level as high and depression as a high.

## 2012-03-03 MED ORDER — CARBAMAZEPINE 200 MG PO TABS
200.0000 mg | ORAL_TABLET | ORAL | Status: DC
  Administered 2012-03-03 – 2012-03-04 (×3): 200 mg via ORAL
  Filled 2012-03-03 (×5): qty 1

## 2012-03-03 NOTE — Progress Notes (Signed)
Recreation Therapy Notes  03/03/2012         Time: 0930      Group Topic/Focus: The focus of this group is on discussing various styles of communication and communicating assertively using 'I' (feeling) statements.  Participation Level: Minimal  Participation Quality: Redirectable  Affect: Irritable   Cognitive: Confused   Additional Comments: Patient participating at times, requiring assistance from RT to participate appropriately. Patient spent the rest of group muttering about needing his private jet to come pick him up.   Arryn Terrones 03/03/2012 1:16 PM

## 2012-03-03 NOTE — Progress Notes (Signed)
Patient ID: David [name], male   DOB: 03/25/1956, 56 y.o.   MRN: [id number] Pt filled out self inventory and reported sleeping well and improving appetite.  He says his ability to pay attention is better.  He takes his medication cooperatively.  He was changed from depakote to tegretol  Since he felt depakote was making him feel agitated.  Pt paces and then returns to room frequently.

## 2012-03-03 NOTE — Progress Notes (Signed)
Patient ID: David [name], male   DOB: June 12, 1956, 56 y.o.   MRN: [id number] Has been walking on the hall this evening, went into the dayroom and seemed to be watching tv, was staring at it. Remains internally focused and responding verbally and physically to internal stimuli, but seems more aware of surroundings and people this week.  Came over to the med window when called, refused to take depakote again tonight.  Tried to talk to him about taking it and he started to become agitated and was adamant about not taking it.  He said it made him feel worse, not better, that he felt more agitated when he took it, and wanted to be put on something else, and that it sometimes made him feel nauseated.  Asked about taking a shot so that he wouldn't have to take as many pills .Assured him that I would put this in my note and he seemed appreciative.  Asked about something to help him feel calmer and to help him sleep, suggested ativan, and he agreed to take it. Has been resting.  Will continue to monitor.[name]

## 2012-03-03 NOTE — Progress Notes (Signed)
[facility] MD Progress Note  03/03/2012 12:27 PM  Diagnosis:  Axis I: Schizophrenia - Paranoid Type.   The patient was seen today and reports the following:   ADL's: Intact.  Sleep: The patient reports to continuing to have significant difficulty initiating sleep but was able to sleep approximately 5 hours last night.  Appetite: The patient reports a poor appetite.   Mild>(1-10) >Severe  Hopelessness (1-10): 0  Depression (1-10): 2-3  Anxiety (1-10): 3   Suicidal Ideation: The patient denies any suicidal ideation today.  Plan: No  Intent: No  Means: No   Homicidal Ideation: The patient denies any homicidal ideations today.  Plan: No  Intent: No.  Means: No   General Appearance/Behavior: Unkempt with ongoing emotional liability.  Eye Contact: Fair.  Speech: Appropriate in rate and volume today with no pressuring noted.  Motor Behavior: Mild agitated continues.  Level of Consciousness: Alert and Oriented x 3.  Mental Status: Alert and Oriented x 3.  Mood: Mildly agitated.  Affect: Mild to Moderately Constricted.  Anxiety Level: Mild anxiety reported.  Thought Process: Delusional and psychotic.  Thought Content: The patient reports ongoing auditory hallucinations as well as paranoid delusions. He denies any visual hallucinations.  Perception:. Delusional.  Judgment: Poor.  Insight: Poor.  Cognition: Oriented to place and person.  Sleep:  Number of Hours: 5    Vital Signs:Blood pressure 112/74, pulse 108, temperature 98.2 F (36.8 C), temperature source Oral, resp. rate 16, height 5\' 10"  (1.778 m), weight 79.379 kg (175 lb).  Current Medications: Current Facility-Administered Medications  Medication Dose Route Frequency Provider Last Rate Last Dose  . acetaminophen (TYLENOL) tablet 650 mg  650 mg Oral Q6H PRN Sanjuana Kava, NP   650 mg at 02/28/12 1005  . alum & mag hydroxide-simeth (MAALOX/MYLANTA) 200-200-20 MG/5ML suspension 30 mL  30 mL Oral Q4H PRN Sanjuana Kava, NP   30  mL at 03/02/12 2313  . amLODipine (NORVASC) tablet 5 mg  5 mg Oral Daily Franchot Gallo, MD   5 mg at 03/03/12 0826  . benztropine (COGENTIN) tablet 1 mg  1 mg Oral BH-q8a2phs Stellan Vick, MD   1 mg at 03/03/12 0827  . carbamazepine (TEGRETOL) tablet 200 mg  200 mg Oral BH-qamhs Alan Riles, MD      . chlordiazePOXIDE (LIBRIUM) capsule 25 mg  25 mg Oral BH-q8a2phs Viviann Spare, NP   25 mg at 03/03/12 0826  . haloperidol (HALDOL) tablet 10 mg  10 mg Oral BH-q8a2p Ranbir Chew, MD   10 mg at 03/03/12 0827  . haloperidol (HALDOL) tablet 20 mg  20 mg Oral QHS Franchot Gallo, MD   20 mg at 03/02/12 2254  . haloperidol (HALDOL) tablet 5 mg  5 mg Oral Q6H PRN Alyson Kuroski-Mazzei, DO   5 mg at 02/25/12 1005  . hydrOXYzine (ATARAX/VISTARIL) tablet 50 mg  50 mg Oral TID PRN Franchot Gallo, MD   50 mg at 02/25/12 1005  . ibuprofen (ADVIL,MOTRIN) tablet 800 mg  800 mg Oral Q6H PRN Franchot Gallo, MD   800 mg at 02/27/12 0224  . LORazepam (ATIVAN) tablet 2 mg  2 mg Oral Q6H PRN Mickie D. Adams, PA   2 mg at 03/02/12 2311  . magnesium hydroxide (MILK OF MAGNESIA) suspension 30 mL  30 mL Oral Daily PRN Sanjuana Kava, NP   30 mL at 02/23/12 0038  . nicotine (NICODERM CQ - dosed in mg/24 hours) patch 21 mg  21 mg Transdermal Q0600 Harvie Heck  Elridge Stemm, MD   21 mg at 03/03/12 0615  . pantoprazole (PROTONIX) EC tablet 40 mg  40 mg Oral QHS Franchot Gallo, MD   40 mg at 03/02/12 2255  . traZODone (DESYREL) tablet 150 mg  150 mg Oral QHS Franchot Gallo, MD   150 mg at 03/02/12 2253  . DISCONTD: divalproex (DEPAKOTE ER) 24 hr tablet 1,000 mg  1,000 mg Oral QHS Franchot Gallo, MD   1,000 mg at 02/29/12 2209  . DISCONTD: LORazepam (ATIVAN) tablet 2 mg  2 mg Oral Once Viviann Spare, NP       Lab Results:  Results for orders placed during the hospital encounter of 02/18/12 (from the past 48 hour(s))  COMPREHENSIVE METABOLIC PANEL     Status: Abnormal   Collection Time   03/01/12  8:10 PM      Component Value  Range Comment   Sodium 135  135 - 145 (mEq/L)    Potassium 4.0  3.5 - 5.1 (mEq/L)    Chloride 101  96 - 112 (mEq/L)    CO2 29  19 - 32 (mEq/L)    Glucose, Bld 124 (*) 70 - 99 (mg/dL)    BUN 16  6 - 23 (mg/dL)    Creatinine, Ser 4.09 (*) 0.50 - 1.35 (mg/dL)    Calcium 8.8  8.4 - 10.5 (mg/dL)    Total Protein 6.0  6.0 - 8.3 (g/dL)    Albumin 2.8 (*) 3.5 - 5.2 (g/dL)    AST 12  0 - 37 (U/L)    ALT 11  0 - 53 (U/L)    Alkaline Phosphatase 73  39 - 117 (U/L)    Total Bilirubin 0.2 (*) 0.3 - 1.2 (mg/dL)    GFR calc non Af Amer 57 (*) >90 (mL/min)    GFR calc Af Amer 66 (*) >90 (mL/min)   CBC     Status: Abnormal   Collection Time   03/01/12  8:10 PM      Component Value Range Comment   WBC 15.8 (*) 4.0 - 10.5 (K/uL)    RBC 3.79 (*) 4.22 - 5.81 (MIL/uL)    Hemoglobin 10.3 (*) 13.0 - 17.0 (g/dL)    HCT 81.1 (*) 91.4 - 52.0 (%)    MCV 85.0  78.0 - 100.0 (fL)    MCH 27.2  26.0 - 34.0 (pg)    MCHC 32.0  30.0 - 36.0 (g/dL)    RDW 78.2 (*) 95.6 - 15.5 (%)    Platelets 208  150 - 400 (K/uL)   DIFFERENTIAL     Status: Abnormal   Collection Time   03/01/12  8:10 PM      Component Value Range Comment   Neutrophils Relative 78 (*) 43 - 77 (%)    Neutro Abs 12.3 (*) 1.7 - 7.7 (K/uL)    Lymphocytes Relative 8 (*) 12 - 46 (%)    Lymphs Abs 1.3  0.7 - 4.0 (K/uL)    Monocytes Relative 8  3 - 12 (%)    Monocytes Absolute 1.3 (*) 0.1 - 1.0 (K/uL)    Eosinophils Relative 6 (*) 0 - 5 (%)    Eosinophils Absolute 0.9 (*) 0.0 - 0.7 (K/uL)    Basophils Relative 0  0 - 1 (%)    Basophils Absolute 0.1  0.0 - 0.1 (K/uL)   VALPROIC ACID LEVEL     Status: Abnormal   Collection Time   03/01/12  8:10 PM      Component Value  Range Comment   Valproic Acid Lvl 10.8 (*) 50.0 - 100.0 (ug/mL)    Lab Results: No results found for this or any previous visit (from the past 48 hour(s)).   The patient continues to appear psychotic with mild agitation. Today he states that [place]  He also reports that he will not take the medication Depakote ER due to nausea but did agree to a trial of Tegretol.  Treatment Plan Summary:  1. Daily contact with patient to assess and evaluate symptoms and progress in treatment  2. Medication management  3. The patient will deny suicidal ideations or homicidal ideations for 48 hours prior to discharge and have a depression and anxiety rating of 3 or less. The patient will also deny any auditory or visual hallucinations or delusional thinking.   Plan:  1. Will continue current medications.  2. Will discontinue the medication Depakote ER due to the patient reporting nausea with this medication. 3. Will start the medication Tegretol 200 mgs po q am and hs for further mood stabilization. 4. Laboratory studies reviewed. 5. Will continue to monitor.   Mauriana Dann 03/03/2012, 12:27 PM

## 2012-03-03 NOTE — Progress Notes (Signed)
[facility] Group Notes:  (Counselor/Nursing/MHT/Case Management/Adjunct)  03/03/2012 2:12 PM  Type of Therapy:  Group Therapy  Participation Level:  Minimal  Participation Quality:  Attentive and Sharing  Affect:  Angry  Cognitive:  Confused  Insight:  None  Engagement in Group:  Limited  Engagement in Therapy:  Limited  Modes of Intervention:  Clarification and Support  Summary of Progress/Problems: Patient appeared to be listening to conversation, but none of his comments were relative to discussion.   Jadarius Commons, Aram Beecham 03/03/2012, 2:12 PM

## 2012-03-03 NOTE — Progress Notes (Signed)
Spoke with Luretha Murphy, PA concerning BLE edema. Pt's mother was upset because it seems to be getting worse and she feels that we are not addressing the cause. Talked with mother about my conversation with Micki. Per [facility] pt may need to wear a soft soled shoe due to constant walking on these hard floors. Pt's albumin level is 2.8. Pt will also be weighed.

## 2012-03-04 ENCOUNTER — Other Ambulatory Visit: Payer: Self-pay

## 2012-03-04 ENCOUNTER — Encounter (HOSPITAL_COMMUNITY): Payer: Self-pay | Admitting: Emergency Medicine

## 2012-03-04 ENCOUNTER — Inpatient Hospital Stay: Admission: AD | Admit: 2012-03-04 | Payer: Medicare Other | Source: Ambulatory Visit | Admitting: Internal Medicine

## 2012-03-04 ENCOUNTER — Inpatient Hospital Stay (HOSPITAL_COMMUNITY)
Admission: EM | Admit: 2012-03-04 | Discharge: 2012-03-06 | DRG: 194 | Disposition: A | Discharge status: Other Acute Inpatient Hospital | Payer: Medicare Other | Attending: Family Medicine | Admitting: Family Medicine

## 2012-03-04 ENCOUNTER — Ambulatory Visit (HOSPITAL_COMMUNITY): Payer: Medicare Other

## 2012-03-04 ENCOUNTER — Ambulatory Visit (HOSPITAL_COMMUNITY)
Admission: EM | Admit: 2012-03-04 | Discharge: 2012-03-04 | Disposition: A | Discharge status: Home / Self Care | Payer: Medicare Other | Attending: Nurse Practitioner | Admitting: Nurse Practitioner

## 2012-03-04 DIAGNOSIS — D72829 Elevated white blood cell count, unspecified: Secondary | ICD-10-CM | POA: Diagnosis present

## 2012-03-04 DIAGNOSIS — R0902 Hypoxemia: Secondary | ICD-10-CM | POA: Diagnosis present

## 2012-03-04 DIAGNOSIS — D649 Anemia, unspecified: Secondary | ICD-10-CM | POA: Diagnosis present

## 2012-03-04 DIAGNOSIS — G4700 Insomnia, unspecified: Secondary | ICD-10-CM | POA: Diagnosis present

## 2012-03-04 DIAGNOSIS — Z9119 Patient's noncompliance with other medical treatment and regimen: Secondary | ICD-10-CM | POA: Diagnosis not present

## 2012-03-04 DIAGNOSIS — J189 Pneumonia, unspecified organism: Secondary | ICD-10-CM | POA: Diagnosis not present

## 2012-03-04 DIAGNOSIS — R0609 Other forms of dyspnea: Secondary | ICD-10-CM | POA: Diagnosis present

## 2012-03-04 DIAGNOSIS — I10 Essential (primary) hypertension: Secondary | ICD-10-CM | POA: Diagnosis present

## 2012-03-04 DIAGNOSIS — K219 Gastro-esophageal reflux disease without esophagitis: Secondary | ICD-10-CM | POA: Diagnosis present

## 2012-03-04 DIAGNOSIS — I129 Hypertensive chronic kidney disease with stage 1 through stage 4 chronic kidney disease, or unspecified chronic kidney disease: Secondary | ICD-10-CM | POA: Diagnosis present

## 2012-03-04 DIAGNOSIS — N182 Chronic kidney disease, stage 2 (mild): Secondary | ICD-10-CM | POA: Diagnosis present

## 2012-03-04 DIAGNOSIS — E871 Hypo-osmolality and hyponatremia: Secondary | ICD-10-CM | POA: Diagnosis present

## 2012-03-04 DIAGNOSIS — F200 Paranoid schizophrenia: Secondary | ICD-10-CM | POA: Diagnosis present

## 2012-03-04 DIAGNOSIS — F05 Delirium due to known physiological condition: Secondary | ICD-10-CM | POA: Diagnosis present

## 2012-03-04 DIAGNOSIS — N183 Chronic kidney disease, stage 3 unspecified: Secondary | ICD-10-CM | POA: Diagnosis present

## 2012-03-04 DIAGNOSIS — R509 Fever, unspecified: Secondary | ICD-10-CM | POA: Diagnosis present

## 2012-03-04 DIAGNOSIS — Z91199 Patient's noncompliance with other medical treatment and regimen due to unspecified reason: Secondary | ICD-10-CM

## 2012-03-04 DIAGNOSIS — R0989 Other specified symptoms and signs involving the circulatory and respiratory systems: Secondary | ICD-10-CM | POA: Diagnosis present

## 2012-03-04 LAB — LACTIC ACID, PLASMA: Lactic Acid, Venous: 0.9 mmol/L (ref 0.5–2.2)

## 2012-03-04 LAB — DIFFERENTIAL
Basophils Absolute: 0 10*3/uL (ref 0.0–0.1)
Eosinophils Absolute: 0.1 10*3/uL (ref 0.0–0.7)
Eosinophils Relative: 1 % (ref 0–5)
Lymphocytes Relative: 4 % — ABNORMAL LOW (ref 12–46)

## 2012-03-04 LAB — CBC
MCV: 84.3 fL (ref 78.0–100.0)
Platelets: 261 10*3/uL (ref 150–400)
RDW: 16.2 % — ABNORMAL HIGH (ref 11.5–15.5)
WBC: 19.5 10*3/uL — ABNORMAL HIGH (ref 4.0–10.5)

## 2012-03-04 LAB — BLOOD GAS, ARTERIAL
Acid-Base Excess: 1.7 mmol/L (ref 0.0–2.0)
Drawn by: 103701
O2 Content: 3 L/min
O2 Saturation: 94 %
Patient temperature: 98.6

## 2012-03-04 LAB — BASIC METABOLIC PANEL
Calcium: 8.5 mg/dL (ref 8.4–10.5)
Chloride: 97 mEq/L (ref 96–112)
Creatinine, Ser: 1.53 mg/dL — ABNORMAL HIGH (ref 0.50–1.35)
GFR calc Af Amer: 58 mL/min — ABNORMAL LOW (ref >90)

## 2012-03-04 MED ORDER — ACETAMINOPHEN 650 MG RE SUPP: 650.0000 mg | Freq: Four times a day (QID) | RECTAL | Status: DC | PRN

## 2012-03-04 MED ORDER — SODIUM CHLORIDE 0.9 % IV SOLN
INTRAVENOUS | Status: DC
  Administered 2012-03-04: 20:00:00 via INTRAVENOUS

## 2012-03-04 MED ORDER — ACETAMINOPHEN 500 MG PO TABS
1000.0000 mg | ORAL_TABLET | Freq: Once | ORAL | Status: AC
  Administered 2012-03-04: 1000 mg via ORAL
  Filled 2012-03-04: qty 2

## 2012-03-04 MED ORDER — LEVOFLOXACIN IN D5W 750 MG/150ML IV SOLN
750.0000 mg | INTRAVENOUS | Status: DC
  Administered 2012-03-04 – 2012-03-05 (×2): 750 mg via INTRAVENOUS
  Filled 2012-03-04 (×3): qty 150

## 2012-03-04 MED ORDER — MORPHINE SULFATE 2 MG/ML IJ SOLN
1.0000 mg | INTRAMUSCULAR | Status: DC | PRN
  Administered 2012-03-05: 1 mg via INTRAVENOUS
  Filled 2012-03-04: qty 1

## 2012-03-04 MED ORDER — ACETAMINOPHEN 325 MG PO TABS: 650.0000 mg | ORAL_TABLET | Freq: Four times a day (QID) | ORAL | Status: DC | PRN

## 2012-03-04 MED ORDER — PIPERACILLIN-TAZOBACTAM 3.375 G IVPB
3.3750 g | Freq: Three times a day (TID) | INTRAVENOUS | Status: DC
  Administered 2012-03-04 – 2012-03-05 (×3): 3.375 g via INTRAVENOUS
  Filled 2012-03-04 (×5): qty 50

## 2012-03-04 MED ORDER — ONDANSETRON HCL 4 MG/2ML IJ SOLN: 4.0000 mg | Freq: Four times a day (QID) | INTRAMUSCULAR | Status: DC | PRN

## 2012-03-04 MED ORDER — HALOPERIDOL LACTATE 5 MG/ML IJ SOLN: 2.0000 mg | Freq: Four times a day (QID) | INTRAMUSCULAR | Status: DC | PRN

## 2012-03-04 MED ORDER — WHITE PETROLATUM GEL
Status: AC
  Administered 2012-03-04: 15:00:00
  Filled 2012-03-04: qty 5

## 2012-03-04 MED ORDER — VANCOMYCIN HCL 1000 MG IV SOLR
750.0000 mg | Freq: Two times a day (BID) | INTRAVENOUS | Status: DC
  Administered 2012-03-05 (×2): 750 mg via INTRAVENOUS
  Filled 2012-03-04 (×3): qty 750

## 2012-03-04 MED ORDER — ONDANSETRON HCL 4 MG PO TABS: 4.0000 mg | ORAL_TABLET | Freq: Four times a day (QID) | ORAL | Status: DC | PRN

## 2012-03-04 MED ORDER — ENOXAPARIN SODIUM 40 MG/0.4ML ~~LOC~~ SOLN
40.0000 mg | SUBCUTANEOUS | Status: DC
  Administered 2012-03-04 – 2012-03-05 (×2): 40 mg via SUBCUTANEOUS
  Filled 2012-03-04 (×4): qty 0.4

## 2012-03-04 MED ORDER — PANTOPRAZOLE SODIUM 40 MG PO TBEC
40.0000 mg | DELAYED_RELEASE_TABLET | Freq: Every day | ORAL | Status: DC
  Administered 2012-03-04 – 2012-03-06 (×3): 40 mg via ORAL
  Filled 2012-03-04 (×4): qty 1

## 2012-03-04 MED ORDER — FOLIC ACID 1 MG PO TABS
1.0000 mg | ORAL_TABLET | Freq: Every day | ORAL | Status: DC
  Administered 2012-03-04 – 2012-03-06 (×3): 1 mg via ORAL
  Filled 2012-03-04 (×4): qty 1

## 2012-03-04 MED ORDER — VANCOMYCIN HCL IN DEXTROSE 1-5 GM/200ML-% IV SOLN
1000.0000 mg | Freq: Once | INTRAVENOUS | Status: AC
  Administered 2012-03-04: 1000 mg via INTRAVENOUS
  Filled 2012-03-04: qty 200

## 2012-03-04 MED ORDER — VITAMIN B-1 100 MG PO TABS
100.0000 mg | ORAL_TABLET | Freq: Every day | ORAL | Status: DC
  Administered 2012-03-04 – 2012-03-06 (×3): 100 mg via ORAL
  Filled 2012-03-04 (×4): qty 1

## 2012-03-04 MED ORDER — ALUM & MAG HYDROXIDE-SIMETH 200-200-20 MG/5ML PO SUSP
ORAL | Status: AC
  Filled 2012-03-04: qty 30

## 2012-03-04 MED ORDER — ZOLPIDEM TARTRATE 5 MG PO TABS: 5.0000 mg | ORAL_TABLET | Freq: Every evening | ORAL | Status: DC | PRN

## 2012-03-04 MED ORDER — SODIUM CHLORIDE 0.9 % IV BOLUS (SEPSIS)
2000.0000 mL | Freq: Once | INTRAVENOUS | Status: AC
  Administered 2012-03-04: 2000 mL via INTRAVENOUS

## 2012-03-04 MED ORDER — CHLORDIAZEPOXIDE HCL 25 MG PO CAPS
25.0000 mg | ORAL_CAPSULE | Freq: Two times a day (BID) | ORAL | Status: DC
  Administered 2012-03-04 (×2): 25 mg via ORAL
  Filled 2012-03-04 (×2): qty 1

## 2012-03-04 MED ORDER — PIPERACILLIN-TAZOBACTAM 3.375 G IVPB
3.3750 g | Freq: Once | INTRAVENOUS | Status: AC
  Administered 2012-03-04: 3.375 g via INTRAVENOUS
  Filled 2012-03-04: qty 50

## 2012-03-04 NOTE — Progress Notes (Signed)
Patient ID: David [name], male   DOB: [birth year], 56 y.o.   MRN: [id number] Pt. Attended group, slept at intervals told group facilitator "if you took as much meds as I do you'd sleep too." Pt. Reports still hear voices. Pt. Was seen talking to self, pt. Having visual hallucinations as well. Denies SHI. Staff will continue to monitor q2min for safety.

## 2012-03-04 NOTE — Tx Team (Signed)
Interdisciplinary Treatment Plan Update (Adult)  Date:  03/04/2012  Time Reviewed:  10:15AM-11:00AM  Progress in Treatment: Attending groups:  Yes Participating in groups:    Yes, although not always engaged in actual content of group Taking medication as prescribed:    Yes, no refusals Tolerating medication:   Yes, patient has not reported any side effects and staff has not noted any Family/Significant other contact made:  Yes Patient understands diagnosis:   Yes, with limited insight and poor judgment Discussing patient identified problems/goals with staff:   Yes, although only in tangential way Medical problems stabilized or resolved:   No, patient to be sent to ER Denies suicidal/homicidal ideation:  Yes Issues/concerns per patient self-inventory:   None Other:    New problem(s) identified: Yes, Describe:  physical issues - to be sent to ED for evaluation  Reason for Continuation of Hospitalization: Hallucinations Medical Issues Medication stabilization  Interventions implemented related to continuation of hospitalization:  Medication monitoring and adjustment, safety checks Q15 min., suicide risk assessment, group therapy, psychoeducation, collateral contact, aftercare planning, ongoing physician assessments, medication education  Additional comments:  Not applicable  Estimated length of stay:  1-2 days  Discharge Plan:  Return to home with family members, follow up at [facility]):  Not applicable  Review of initial/current patient goals per problem list:   1.  Goal(s):  Eliminate aggressive behaviors   Met:  Yes  Target date:  By Discharge   As evidenced by:  Achieved last tx tm  2.  Goal(s):  Reduce psychotic symptoms (hallucinations) to baseline.  Met:  No  Target date:  By Discharge   As evidenced by:  Is getting close  3.  Goal(s):  Reduce paranoia to baseline.  Met:  No  Target date:  By Discharge   As evidenced by:  Is getting close  4.   Goal(s):  Deny SI/HI for 48 hours prior to D/C.  Met:  Yes  Target date:  By Discharge   As evidenced by:  No SI/HI last 48 hours  Attendees: Patient:  Did not attend - on way to ED   Family:     Physician:  Dr. Harvie Heck Readling 03/04/2012 10:15AM-11:00AM  Nursing:   Neill Loft, RN 03/04/2012 10:15AM -11:00AM   Case Manager:  Ambrose Mantle, LCSW 03/04/2012 10:15AM-11:00AM  Counselor:  Veto Kemps, MT-BC 03/04/2012 10:15AM-11:00AM  Other:   Lynann Bologna, NP 03/04/2012 10:15AM-11:00AM  Other:      Other:      Other:       Scribe for Treatment Team:   Sarina Ser, 03/04/2012, 10:15AM-11:15AM

## 2012-03-04 NOTE — H&P (Signed)
PCP:   No primary provider on file.   Chief Complaint:  Fever, cough, shortness of breath, elevated WBC's and AMS.  HPI: 56 -year-old male with a past medical history significant for hypertension, gastroesophageal reflux disease, hx of alcohol abuse and paranoid schizophrenia; who [facility] found to be less responsive, with some difficulty breathing and cough. Patient was also found with elevated wbc's and a high temperature up to 101.2. A chest x-ray done at the [facility] demonstrated right lower lobe pneumonia. Patient has been transfer and [facility] has been called to admit the patient for further evaluation and treatment. [facility] mid-February secondary to acute schizophrenia attack.   Allergies:  No Known Allergies    Past Medical History  Diagnosis Date  . Schizophrenia   . Hypertension     Past Surgical History  Procedure Date  . Tonsillectomy     Prior to Admission medications   Medication Sig Start Date End Date Taking? Authorizing Provider  chlordiazePOXIDE (LIBRIUM) 25 MG capsule Take 25 mg by mouth 2 (two) times daily.    Historical Provider, MD  diazepam (VALIUM) 10 MG tablet Take 10 mg by mouth 2 (two) times daily.    Historical Provider, MD  haloperidol lactate (HALDOL) 5 MG/ML injection Inject 150 mg into the skin every 30 (thirty) days.    Historical Provider, MD  pantoprazole (PROTONIX) 40 MG tablet Take 40 mg by mouth daily.    Historical Provider, MD  zolpidem (AMBIEN) 10 MG tablet Take 10 mg by mouth at bedtime as needed. For insomnia    Historical Provider, MD    Social History:  reports that he has been smoking.  He has never used smokeless tobacco. He reports that he drinks alcohol. He reports that he uses illicit drugs (Cocaine and Marijuana).  No family history on file.  Review of Systems:  Negative except as otherwise mentioned on  history of present illness.   Physical Exam: Blood pressure 118/59, pulse 95, temperature 101.1 F (38.4 C), temperature source Rectal, resp. rate 26, SpO2 92.00%. Constitutional: Somnolent but easily aroused; oriented to person and place; ill-appearing and warm to touch.. Head: Normocephalic and atraumatic.  Eyes: Conjunctivae are normal. Pupils are equal, round, and reactive to light. No icterus Neck: No thyromegaly, no bruits, supple. Cardiovascular: mild tachycardia, no murmurs, S1 and S2 Pulmonary/Chest:positive rhonchi, no crackles; decreased breath sounds at bases bilaterally. Abdominal: Soft. Bowel sounds are normal. He exhibits no distension. There is no tenderness.  Musculoskeletal: Normal range of motion. He exhibits no edema and no tenderness.  Neurological: No cranial nerve deficit. follow simple commands and moves all extremities  Skin: Skin is warm and dry. No rash noted.   Labs on Admission:  Results for orders placed during the hospital encounter of 03/04/12 (from the past 48 hour(s))  GLUCOSE, CAPILLARY     Status: Abnormal   Collection Time   03/04/12 10:57 AM      Component Value Range Comment   Glucose-Capillary 105 (*) 70 - 99 (mg/dL)   CBC     Status: Abnormal   Collection Time   03/04/12 11:00 AM      Component Value Range Comment   WBC 19.5 (*) 4.0 - 10.5 (K/uL)    RBC 3.69 (*) 4.22 - 5.81 (MIL/uL)    Hemoglobin 10.0 (*) 13.0 - 17.0 (g/dL)    HCT 16.1 (*) 09.6 - 52.0 (%)  MCV 84.3  78.0 - 100.0 (fL)    MCH 27.1  26.0 - 34.0 (pg)    MCHC 32.2  30.0 - 36.0 (g/dL)    RDW 16.1 (*) 09.6 - 15.5 (%)    Platelets 261  150 - 400 (K/uL)   DIFFERENTIAL     Status: Abnormal   Collection Time   03/04/12 11:00 AM      Component Value Range Comment   Neutrophils Relative 89 (*) 43 - 77 (%)    Neutro Abs 17.3 (*) 1.7 - 7.7 (K/uL)    Lymphocytes Relative 4 (*) 12 - 46 (%)    Lymphs Abs 0.7  0.7 - 4.0 (K/uL)    Monocytes Relative 7  3 - 12 (%)    Monocytes Absolute  1.3 (*) 0.1 - 1.0 (K/uL)    Eosinophils Relative 1  0 - 5 (%)    Eosinophils Absolute 0.1  0.0 - 0.7 (K/uL)    Basophils Relative 0  0 - 1 (%)    Basophils Absolute 0.0  0.0 - 0.1 (K/uL)   BLOOD GAS, ARTERIAL     Status: Abnormal   Collection Time   03/04/12 12:00 PM      Component Value Range Comment   O2 Content 3.0      Delivery systems NASAL CANNULA      pH, Arterial 7.401  7.350 - 7.450     pCO2 arterial 43.0  35.0 - 45.0 (mmHg)    pO2, Arterial 73.7 (*) 80.0 - 100.0 (mmHg)    Bicarbonate 26.2 (*) 20.0 - 24.0 (mEq/L)    TCO2 24.6  0 - 100 (mmol/L)    Acid-Base Excess 1.7  0.0 - 2.0 (mmol/L)    O2 Saturation 94.0      Patient temperature 98.6      Collection site RIGHT RADIAL      Drawn by [id number]      Sample type ARTERIAL      Allens test (pass/fail) PASS  PASS      Radiological Exams on Admission: Dg Chest 2 View  03/04/2012  *RADIOLOGY REPORT*  Clinical Data: 56 year old male with fever, tachycardia, shortness of breath.  CHEST - 2 VIEW  Comparison: 02/18/2012 and earlier.  Findings: AP and lateral views of the chest.  Right lower lobe airspace disease.  Small right greater than left pleural effusions. Lower lung volumes.  Cardiac size and mediastinal contours are within normal limits.  Visualized tracheal air column is within normal limits.  No pneumothorax.  No pulmonary edema  IMPRESSION: Increased right lower lung airspace disease compatible with pneumonia or aspiration.  Small right greater than left pleural effusions.  Original Report Authenticated By: Harley Hallmark, M.D.     Assessment/Plan 1-HCAP (healthcare-associated pneumonia): Will admit the patient for IV antibiotics using Levaquin, vancomycin and Zosyn. Will provide supplemental oxygen and incentive spirometry.  2-Schizophrenia, paranoid, chronic: Will continue PRN haldol and will follow Psych recommendations; they will follow patient while in the hospital.  3-HTN (hypertension): stable w/o using any medications;  low sodium diet and will continue monitoring for now.  4-GERD (gastroesophageal reflux disease):continue PPI.  5-Hypoxia, Fever and Leukocytosis: 2/2 #1; continue treatment as above; provide supportive care and PRN antypyretics.  6-Insomnia: PRN zolpidem.  7-CKD (chronic kidney disease) stage 2, GFR 60-89 ml/min: stable and currently at baseline (lives with Cr level in the 1.6-1.8 range). Will follow kidney function and adjust antibiotics per pharmacy to his GFR.  8-DVT: Lovenox.     Time  Spent on Admission: 50 minutes  Dragan Tamburrino [facility] [phone]  03/04/2012, 12:17 PM

## 2012-03-04 NOTE — Progress Notes (Addendum)
ANTIBIOTIC CONSULT NOTE - INITIAL  Pharmacy Consult for Vancomycin/Zosyn/Levaquin Indication: HCAP  No Known Allergies  Patient Measurements: Height: 6\' 1"  (185.4 cm) Weight: 179 lb 8 oz (81.421 kg) IBW/kg (Calculated) : 79.9    Vital Signs: Temp: 97.9 F (36.6 C) (03/05 1952) Temp src: Oral (03/05 1952) BP: 122/71 mmHg (03/05 1952) Pulse Rate: 94  (03/05 1952) Intake/Output from previous day:   Intake/Output from this shift:    Labs:  Basename 03/04/12 1100  WBC 19.5*  HGB 10.0*  PLT 261  LABCREA --  CREATININE 1.53*   Estimated Creatinine Clearance: 62.4 ml/min (by C-G formula based on Cr of 1.53).   56 ml/min (normalized)  Microbiology: No results found for this or any previous visit (from the past 720 hour(s)).  Medical History: Past Medical History  Diagnosis Date  . Schizophrenia   . Hypertension     Medications:  Scheduled:    . acetaminophen  1,000 mg Oral Once  . alum & mag hydroxide-simeth      . chlordiazePOXIDE  25 mg Oral BID  . enoxaparin  40 mg Subcutaneous Q24H  . folic acid  1 mg Oral Daily  . pantoprazole  40 mg Oral Daily  . piperacillin-tazobactam (ZOSYN)  IV  3.375 g Intravenous Once  . sodium chloride  2,000 mL Intravenous Once  . thiamine  100 mg Oral Daily  . vancomycin  1,000 mg Intravenous Once  . white petrolatum       Assessment:  55 YOM transferred from [facility] after being found less responsive, with difficulty breathing & cough  Now febrile with elevated WBC, CXR shows RLL PNA  Beginning broad spectrum antibiotics for HCAP coverage   Goal of Therapy:  Vancomycin trough level 15-20 mcg/ml  Plan:   Vancomycin 1gm given in ER ~12:30, continue with 750mg  IV q12h  Check trough at steady state Zosyn 3.375gm IV q8h (4hr extended infusions) Levaquin 750mg  IV q24h Follow renal function & cultures  Loralee Pacas, PharmD, BCPS Pager: [phone] 03/04/2012,8:17 PM

## 2012-03-04 NOTE — ED Notes (Signed)
Report called to floor.  Pt. To be transported at 1930.

## 2012-03-04 NOTE — Progress Notes (Signed)
Patient ID: David [name], male   DOB: 05/18/1956, 56 y.o.   MRN: [id number] Pt did not get up for breakfast.  He says he feels dizzy at times.  He is lethargic .  Sent for chest x ray by [facility]  To [facility].  Talked to pt's mother Talbert Forest and informed her of plan to get x-ray and have pt seen by internal medicine

## 2012-03-04 NOTE — ED Notes (Signed)
Pt was brought over from [facility] for a chest x ray via [facility], they stopped through the ed due to there is not more admit beds, pt is lethargic, confused, labored breathing, per facility pts normal is walking and singing talink, states that he has not been normal for a few days now. Pt is hot to touch, using accesory muscles

## 2012-03-04 NOTE — Progress Notes (Signed)
Patient ID: David [name], male   DOB: 1956/03/22, 56 y.o.   MRN: [id number] 17 y o gentleman with chronic schizophrenia, paranoid type.  Today he is more drowsy than is typical for him and missed breakfast, appetite is poor, has 1+ pitting pedal edema, and has begun coughing.  His color is slightly dusky and pulse ox was 93-95% around 9am. Afebrile.  Pulse is 108 this am. CBC on Saturday reflects increased leukocytosis. This is in sharp contrast to last week when he was up and active with no complaints and eating with gusto.    Has been started on Tegretol yesterday - after he declined the Depakote over the weekend.  His speech today is fairly logical, and non-pressured. Needed help getting his hospital gown on today, but is cooperative and directable but appears ill.    A:  Have spoken with Dr. Ardyth Harps, [facility] for Inpatient consult and she requested an x-ray. CxR shows Right LL pneumonia and they are willing to admit patient but no bed available at this time - he is being discharged to the TCU at [facility] to await bed placement.    I will discharge him at this time.  Dr. Allena Katz notified and agrees with disposition.

## 2012-03-04 NOTE — ED Notes (Signed)
Pt's mother [name] [phone]

## 2012-03-04 NOTE — ED Provider Notes (Signed)
History     CSN: [id number]  Arrival date & time 03/04/12  1048   First MD Initiated Contact with Patient 03/04/12 1112      Chief Complaint  Patient presents with  . Shortness of Breath  . Altered Mental Status    (Consider location/radiation/quality/duration/timing/severity/associated sxs/prior treatment) HPI Level V caveat patient was responsive; history is obtained from Judie Petit. Scott,NP at behavioral [facility]  via telephone. Patient less responsive and exhibited difficulty breathing and cough, worsening this morning patient last seen by Ms. Scott 4 days ago when he was alert and talkative today here. Less responsive felt warm complexion was grayish. [facility] obtained at [facility] consistent with worsening pneumonia patient sent here for further evaluation and possible admission. No treatment prior to coming here          Past Medical History  Diagnosis Date  . Schizophrenia   . Hypertension     Past Surgical History  Procedure Date  . Tonsillectomy     No family history on file.  History  Substance Use Topics  . Smoking status: Current Everyday Smoker  . Smokeless tobacco: Never Used  . Alcohol Use: Yes      Review of Systems  Unable to perform ROS: Mental status change  Respiratory: Positive for cough and shortness of breath.     Allergies  Review of patient's allergies indicates no known allergies.  Home Medications   Current Outpatient Rx  Name Route Sig Dispense Refill  . CHLORDIAZEPOXIDE HCL 25 MG PO CAPS Oral Take 25 mg by mouth 2 (two) times daily.    [name] DIAZEPAM 10 MG PO TABS Oral Take 10 mg by mouth 2 (two) times daily.    [name] HALOPERIDOL LACTATE 5 MG/ML IJ SOLN Subcutaneous Inject 150 mg into the skin every 30 (thirty) days.    [name] PANTOPRAZOLE SODIUM 40 MG PO TBEC Oral Take 40 mg by mouth daily.    [name] ZOLPIDEM TARTRATE 10 MG PO TABS Oral Take 10 mg by mouth at bedtime as needed. For insomnia      BP 118/59  Pulse 95   Temp(Src) 101.1 F (38.4 C) (Rectal)  Resp 26  SpO2 92%  Physical Exam  Nursing note and vitals reviewed. Constitutional: He appears well-developed and well-nourished. He appears distressed.       Ill-appearing, somewhat somnolent arousable to verbal stimulus  HENT:  Head: Normocephalic and atraumatic.  Eyes: Conjunctivae are normal. Pupils are equal, round, and reactive to light.  Neck: Neck supple. No tracheal deviation present. No thyromegaly present.  Cardiovascular: Normal rate and regular rhythm.   No murmur heard. Pulmonary/Chest: Effort normal.       Rales bilaterally at bases  Abdominal: Soft. Bowel sounds are normal. He exhibits no distension. There is no tenderness.  Musculoskeletal: Normal range of motion. He exhibits no edema and no tenderness.  Neurological: No cranial nerve deficit. Coordination normal.       Speech slurred follow simple commands moves all extremities  Skin: Skin is warm and dry. No rash noted.  Psychiatric: He has a normal mood and affect.    ED Course  Procedures (including critical care time) 1:15 PM patient feels improved he is more alert talkative speech mildly slurred, after treatment with intravenous fluids and intravenous antibiotics. Old records reviewed. Spoke with Dr. Alferd Patee , admission arranged Labs Reviewed  GLUCOSE, CAPILLARY - Abnormal; Notable for the following:    Glucose-Capillary 105 (*)    All other components within normal  limits  CULTURE, BLOOD (ROUTINE X 2)  CULTURE, BLOOD (ROUTINE X 2)  CBC  DIFFERENTIAL  LACTIC ACID, PLASMA  BASIC METABOLIC PANEL  BLOOD GAS, ARTERIAL   Dg Chest 2 View  03/04/2012  *RADIOLOGY REPORT*  Clinical Data: 56 year old male with fever, tachycardia, shortness of breath.  CHEST - 2 VIEW  Comparison: 02/18/2012 and earlier.  Findings: AP and lateral views of the chest.  Right lower lobe airspace disease.  Small right greater than left pleural effusions. Lower lung volumes.  Cardiac size and  mediastinal contours are within normal limits.  Visualized tracheal air column is within normal limits.  No pneumothorax.  No pulmonary edema  IMPRESSION: Increased right lower lung airspace disease compatible with pneumonia or aspiration.  Small right greater than left pleural effusions.  Original Report Authenticated By: Harley Hallmark, M.D.     No diagnosis found.  Results for orders placed during the hospital encounter of 03/04/12  GLUCOSE, CAPILLARY      Component Value Range   Glucose-Capillary 105 (*) 70 - 99 (mg/dL)  CBC      Component Value Range   WBC 19.5 (*) 4.0 - 10.5 (K/uL)   RBC 3.69 (*) 4.22 - 5.81 (MIL/uL)   Hemoglobin 10.0 (*) 13.0 - 17.0 (g/dL)   HCT 16.1 (*) 09.6 - 52.0 (%)   MCV 84.3  78.0 - 100.0 (fL)   MCH 27.1  26.0 - 34.0 (pg)   MCHC 32.2  30.0 - 36.0 (g/dL)   RDW 04.5 (*) 40.9 - 15.5 (%)   Platelets 261  150 - 400 (K/uL)  DIFFERENTIAL      Component Value Range   Neutrophils Relative 89 (*) 43 - 77 (%)   Neutro Abs 17.3 (*) 1.7 - 7.7 (K/uL)   Lymphocytes Relative 4 (*) 12 - 46 (%)   Lymphs Abs 0.7  0.7 - 4.0 (K/uL)   Monocytes Relative 7  3 - 12 (%)   Monocytes Absolute 1.3 (*) 0.1 - 1.0 (K/uL)   Eosinophils Relative 1  0 - 5 (%)   Eosinophils Absolute 0.1  0.0 - 0.7 (K/uL)   Basophils Relative 0  0 - 1 (%)   Basophils Absolute 0.0  0.0 - 0.1 (K/uL)  LACTIC ACID, PLASMA      Component Value Range   Lactic Acid, Venous 0.9  0.5 - 2.2 (mmol/L)  BASIC METABOLIC PANEL      Component Value Range   Sodium 130 (*) 135 - 145 (mEq/L)   Potassium 4.0  3.5 - 5.1 (mEq/L)   Chloride 97  96 - 112 (mEq/L)   CO2 29  19 - 32 (mEq/L)   Glucose, Bld 107 (*) 70 - 99 (mg/dL)   BUN 18  6 - 23 (mg/dL)   Creatinine, Ser 8.11 (*) 0.50 - 1.35 (mg/dL)   Calcium 8.5  8.4 - 91.4 (mg/dL)   GFR calc non Af Amer 50 (*) >90 (mL/min)   GFR calc Af Amer 58 (*) >90 (mL/min)  BLOOD GAS, ARTERIAL      Component Value Range   O2 Content 3.0     Delivery systems NASAL CANNULA       pH, Arterial 7.401  7.350 - 7.450    pCO2 arterial 43.0  35.0 - 45.0 (mmHg)   pO2, Arterial 73.7 (*) 80.0 - 100.0 (mmHg)   Bicarbonate 26.2 (*) 20.0 - 24.0 (mEq/L)   TCO2 24.6  0 - 100 (mmol/L)   Acid-Base Excess 1.7  0.0 - 2.0 (mmol/L)  O2 Saturation 94.0     Patient temperature 98.6     Collection site RIGHT RADIAL     Drawn by [id number]     Sample type ARTERIAL     Allens test (pass/fail) PASS  PASS    Dg Chest 2 View  03/04/2012  *RADIOLOGY REPORT*  Clinical Data: 56 year old male with fever, tachycardia, shortness of breath.  CHEST - 2 VIEW  Comparison: 02/18/2012 and earlier.  Findings: AP and lateral views of the chest.  Right lower lobe airspace disease.  Small right greater than left pleural effusions. Lower lung volumes.  Cardiac size and mediastinal contours are within normal limits.  Visualized tracheal air column is within normal limits.  No pneumothorax.  No pulmonary edema  IMPRESSION: Increased right lower lung airspace disease compatible with pneumonia or aspiration.  Small right greater than left pleural effusions.  Original Report Authenticated By: Harley Hallmark, M.D.   Dg Chest 2 View  02/16/2012  *RADIOLOGY REPORT*  Clinical Data: The mental status, shortness of breath.  CHEST - 2 VIEW  Comparison: 02/15/2012  Findings: Bibasilar airspace opacities.  Blunted costophrenic angles.  Cardiomegaly.  No pneumothorax.  No acute osseous abnormality.  IMPRESSION: Bibasilar opacities; atelectasis versus infection.  Small pleural effusions.  Original Report Authenticated By: Waneta Martins, M.D.   Dg Chest Port 1 View  02/18/2012  *RADIOLOGY REPORT*  Clinical Data: Follow up pleural effusions.  Medical clearance.  PORTABLE CHEST - 1 VIEW 02/18/2012 1340 hours:  Comparison: Two-view chest x-ray 02/16/2012 [facility], 05/02/2005 [facility].  Findings: Bilateral pleural effusions, unchanged.  Associated mild passive atelectasis in the lower lobes, right greater than  left, unchanged.  No new pulmonary parenchymal abnormalities.  Cardiac silhouette normal in size for the AP portable technique.  Pulmonary vascularity normal.  IMPRESSION: Stable bilateral pleural effusions and associated mild passive atelectasis in the lower lobes.  No new abnormalities.  Original Report Authenticated By: Arnell Sieving, M.D.   Dg Chest Portable 1 View  02/15/2012  *RADIOLOGY REPORT*  Clinical Data: Shortness of breath and chest pain; altered mental status.  PORTABLE CHEST - 1 VIEW  Comparison: Chest radiograph performed 07/10/2005  Findings: The lungs are well-aerated.  Mild bibasilar airspace opacities are noted, with blunting of the right costophrenic angle. Mild pneumonia cannot be excluded; stable blunting at the right costophrenic angle is thought to reflect pleural thickening, though a small pleural effusion cannot be excluded.  No pneumothorax is seen.  The cardiomediastinal silhouette is borderline normal in size.  No acute osseous abnormalities are seen.  IMPRESSION:  1.  Mild bibasilar airspace opacities noted, new from the prior study; this could reflect pneumonia. 2.  Stable blunting of the right costophrenic angle is thought to reflect pleural thickening, though a small right pleural effusion cannot be excluded.  Original Report Authenticated By: Tonia Ghent, M.D.     MDM  Plan admit medical surgical floor; intravenous antibiotics Diagnosis #1 pneumonia (HCAP) #2 sepsis #3 anemia #4 hyponatremia CRITICAL CARE Performed by: Doug Sou   Total critical care time: 30 minutes  Critical care time was exclusive of separately billable procedures and treating other patients.  Critical care was necessary to treat or prevent imminent or life-threatening deterioration.  Critical care was time spent personally by me on the following activities: development of treatment plan with patient and/or surrogate as well as nursing, discussions with consultants, evaluation  of patient's response to treatment, examination of patient, obtaining history from patient or surrogate, ordering  and performing treatments and interventions, ordering and review of laboratory studies, ordering and review of radiographic studies, pulse oximetry and re-evaluation of patient's condition.        Doug Sou, MD 03/04/12 1322

## 2012-03-05 DIAGNOSIS — F200 Paranoid schizophrenia: Secondary | ICD-10-CM

## 2012-03-05 LAB — BASIC METABOLIC PANEL
CO2: 28 mEq/L (ref 19–32)
Calcium: 8.2 mg/dL — ABNORMAL LOW (ref 8.4–10.5)
Creatinine, Ser: 1.5 mg/dL — ABNORMAL HIGH (ref 0.50–1.35)
GFR calc Af Amer: 59 mL/min — ABNORMAL LOW (ref >90)

## 2012-03-05 LAB — CBC
MCH: 27.2 pg (ref 26.0–34.0)
MCHC: 32.2 g/dL (ref 30.0–36.0)
MCV: 84.6 fL (ref 78.0–100.0)
Platelets: 230 10*3/uL (ref 150–400)
RDW: 16.4 % — ABNORMAL HIGH (ref 11.5–15.5)

## 2012-03-05 MED ORDER — HALOPERIDOL 5 MG PO TABS
10.0000 mg | ORAL_TABLET | ORAL | Status: DC
  Administered 2012-03-05 – 2012-03-06 (×4): 10 mg via ORAL
  Filled 2012-03-05 (×6): qty 2

## 2012-03-05 MED ORDER — CHLORDIAZEPOXIDE HCL 25 MG PO CAPS
25.0000 mg | ORAL_CAPSULE | Freq: Three times a day (TID) | ORAL | Status: DC
  Administered 2012-03-05 – 2012-03-06 (×5): 25 mg via ORAL
  Filled 2012-03-05 (×5): qty 1

## 2012-03-05 MED ORDER — PANTOPRAZOLE SODIUM 40 MG PO TBEC
40.0000 mg | DELAYED_RELEASE_TABLET | Freq: Every day | ORAL | Status: DC
  Administered 2012-03-05: 40 mg via ORAL
  Filled 2012-03-05 (×3): qty 1

## 2012-03-05 MED ORDER — LORAZEPAM 1 MG PO TABS: 2.0000 mg | ORAL_TABLET | Freq: Four times a day (QID) | ORAL | Status: DC | PRN

## 2012-03-05 MED ORDER — NICOTINE 21 MG/24HR TD PT24
21.0000 mg | MEDICATED_PATCH | Freq: Every day | TRANSDERMAL | Status: DC
  Administered 2012-03-05 – 2012-03-06 (×2): 21 mg via TRANSDERMAL
  Filled 2012-03-05 (×4): qty 1

## 2012-03-05 MED ORDER — HALOPERIDOL 5 MG PO TABS
5.0000 mg | ORAL_TABLET | Freq: Four times a day (QID) | ORAL | Status: DC | PRN
  Filled 2012-03-05: qty 1

## 2012-03-05 MED ORDER — AMLODIPINE BESYLATE 5 MG PO TABS
5.0000 mg | ORAL_TABLET | Freq: Every day | ORAL | Status: DC
  Administered 2012-03-05 – 2012-03-06 (×2): 5 mg via ORAL
  Filled 2012-03-05 (×3): qty 1

## 2012-03-05 MED ORDER — HYDROXYZINE HCL 50 MG PO TABS
50.0000 mg | ORAL_TABLET | Freq: Three times a day (TID) | ORAL | Status: DC | PRN
  Filled 2012-03-05: qty 1

## 2012-03-05 MED ORDER — HALOPERIDOL 5 MG PO TABS
20.0000 mg | ORAL_TABLET | Freq: Every day | ORAL | Status: DC
  Administered 2012-03-05: 20 mg via ORAL
  Filled 2012-03-05 (×3): qty 4

## 2012-03-05 MED ORDER — TRAZODONE HCL 150 MG PO TABS
150.0000 mg | ORAL_TABLET | Freq: Every day | ORAL | Status: DC
  Administered 2012-03-05: 150 mg via ORAL
  Filled 2012-03-05 (×3): qty 1

## 2012-03-05 MED ORDER — BENZTROPINE MESYLATE 1 MG PO TABS
1.0000 mg | ORAL_TABLET | Freq: Three times a day (TID) | ORAL | Status: DC
  Administered 2012-03-05 – 2012-03-06 (×4): 1 mg via ORAL
  Filled 2012-03-05 (×9): qty 1

## 2012-03-05 MED ORDER — CARBAMAZEPINE 200 MG PO TABS
200.0000 mg | ORAL_TABLET | Freq: Two times a day (BID) | ORAL | Status: DC
  Administered 2012-03-05 – 2012-03-06 (×3): 200 mg via ORAL
  Filled 2012-03-05 (×7): qty 1

## 2012-03-05 MED ORDER — IBUPROFEN 800 MG PO TABS: 800.0000 mg | ORAL_TABLET | Freq: Four times a day (QID) | ORAL | Status: DC | PRN

## 2012-03-05 NOTE — Progress Notes (Signed)
Met with Pt to discuss current admission.  Pt is a 56 year old, white male who has been dx'd with Schizophrenia.  Pt currently suffering with delusions and paranoia.  Pt continually discussed the $22 million that [facility] was going to give to him for his bogus health inspection.  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    Pt was tangential and had pxs with echolalia.  Pt laughed inappropriately and his affect ranged from happy to irritable and agitated.  Pt was a poor historian and had poor insight.  Information obtained in CSW's Ax is questionable.  Pt denied SI.  He reported HI, by hx, but was not inclined to elaborate.  Pt reported using MDA and LSD and stated that he drinks 1 shot or flask of Christiane Ha per day.  Pt reports that he has "3 children in the sky", that he lives with his brother, [name], and that he was married for 12 years.  Pt reports that he's currently on disability due to his dx of Schizophrenia.  CSW left message for Pt's mom, [name], at [phone].  Spoke with Pt's mom.  Pt's mom stated that Pt had been doing well for the past 6 years.  She had been going to [facility] in [place] 1x per month for Haldol.  His medication regimen consisted of Haldol, Librium, Protonix and Ambien.  Pt's mom stated that Pt attempted to get his Librium Rx on February 1st and he learned that his insurance no longer covered this med.  Pt's mom stated that Pt's step-dad died 02/08/2024of this year and Pt's aunt died 03-08-24of this year.  Pt witnessed his aunt pass away.  Pt had been off of his Librium a week prior to this incident and mom feels that these deaths, coupled with no anti-psychotic, sent Pt "over the edge."  Pt's mom concerned about Pt being able to pay for his Librium and states that this is the only med that works for Pt.  CSW encouraged Pt's mom to contact the insurance company with her concerns and inquire into whether they'd consider a  note from Pt's MD stating medical necessity for this med.  Mom to do this and contact CSW if she's successful.  CSW thanked Pt's mom for her time.  CSW to continue to follow.  Providence Crosby, LCSWA Clinical Social Work [phone]

## 2012-03-05 NOTE — Consult Note (Signed)
Patient Identification:  David [name] Date of Evaluation:  03/05/2012   History of Present Illness: Patient is 56 year old Caucasian male who was [facility] however later transferred to medical floor has found less responsive and difficulty with breathing. He also had temperature and chest x-ray shows pneumonia. Patient has long history of schizophrenia. Recently he has been noncompliant with his medication. He was admitted at behavioral [facility] due to family request has patient was aggressive, inappropriate, poor sleep and difficult to take care of himself. Patient seen today he remains disorganized confused and seems like responding to internal stimuli. He is a poor historian and did not provide much information. He admitted that he was not taking his medication and has not seen his psychiatrist Dr. Betti Cruz in past 8 months. He admitted having paranoid thinking, racing thoughts and hallucinations. He remembered taking Haldol injection but also admitted noncompliant for past few months. At behavioral [facility] he was giving Haldol, Tegretol and Cogentin. At this time patient reported no side effects of medication.  Past Psychiatric History: Patient has multiple psychiatric hospitalizations due to decompensation of the schizophrenia.   Past Medical History:     Past Medical History  Diagnosis Date  . Schizophrenia   . Hypertension        Past Surgical History  Procedure Date  . Tonsillectomy     Allergies: No Known Allergies  Current Medications:  Prior to Admission medications   Medication Sig Start Date End Date Taking? Authorizing Provider  chlordiazePOXIDE (LIBRIUM) 25 MG capsule Take 25 mg by mouth 2 (two) times daily.    Historical Provider, MD  diazepam (VALIUM) 10 MG tablet Take 10 mg by mouth 2 (two) times daily.    Historical Provider, MD  haloperidol lactate (HALDOL) 5 MG/ML injection Inject 150 mg into the skin every 30 (thirty) days.     Historical Provider, MD  pantoprazole (PROTONIX) 40 MG tablet Take 40 mg by mouth daily.    Historical Provider, MD  zolpidem (AMBIEN) 10 MG tablet Take 10 mg by mouth at bedtime as needed. For insomnia    Historical Provider, MD    Social History:    reports that he has been smoking.  He has never used smokeless tobacco. He reports that he drinks alcohol. He reports that he uses illicit drugs (Cocaine and Marijuana).   Family History:    History reviewed. No pertinent family history.  Alcohol and substance use history Patient admitted history of using illicit drugs.  Mental Status Examination/Evaluation: Objective:  Appearance: Disheveled and Guarded  Psychomotor Activity:  Increased and Restlessness  Eye Contact::  Poor  Speech:  Blocked and Slurred  Volume:  Increased  Mood:  Angry and Irritable  Affect:  Inappropriate  Thought Process:  Disorganized  Orientation:  Full  Thought Content:  Auditory hallucinations, Delusions and Paranoia  Suicidal Thoughts:  No  Homicidal Thoughts:  No  Judgement:  Impaired  Insight:  Absent    DIAGNOSIS:   AXIS I   schizophrenia paranoid type   AXIS II  Deffered  AXIS III See medical notes.  AXIS IV problems related to social environment and problems with primary support group  AXIS V 31-40 impairment in reality testing     Recommendations: Patient is taking antipsychotic medication which was started recently on his admission at behavioral [facility]. He has been decompensated in past few months. He appears disorganized and notice talking to himself. At this time he is not violent aggressive  and cooperate with the staff however he will require inpatient psychiatric treatment once he is medically cleared. Continue his current psychiatric medication. Consider getting Tegretol level. Please call consultation liaison services for followup.

## 2012-03-05 NOTE — Progress Notes (Signed)
PROGRESS NOTE  David [name] ZOX:[id number] DOB: 03-Jul-1956 DOA: 03/04/2012 PCP: Sheila Oats, MD, MD  Brief narrative: 56 year old man initially admitted to [facility] for schizophrenia, subsequently transferred to [facility] long hospital for confusion. Patient noted be lethargic, confused and in respiratory distress. Fundi have pneumonia and admitted for further evaluation.  Past medical history: Paranoid schizophrenia, GERD, hypertension, alcohol abuse  Consultants:  Psychiatry  Procedures:  None  Antibiotics:  March 5: Levaquin  March 5-6: Zosyn  March 5-6: Vancomycin  Interim History: Psychiatry evaluation reviewed. Discussed with RN.  Subjective: Feels okay.  Objective: Filed Vitals:   03/04/12 1613 03/04/12 1952 03/05/12 0535 03/05/12 1331  BP: 126/76 122/71 104/64 130/70  Pulse: 95 94 86 105  Temp: 99 F (37.2 C) 97.9 F (36.6 C) 98.2 F (36.8 C) 97.8 F (36.6 C)  TempSrc: Oral Oral Oral Oral  Resp: 16 19 20 20   Height:  6\' 1"  (1.854 m)    Weight:  81.421 kg (179 lb 8 oz)    SpO2: 95% 93% 93% 94%    Intake/Output Summary (Last 24 hours) at 03/05/12 1658 Last data filed at 03/05/12 1340  Gross per 24 hour  Intake 1956.25 ml  Output      0 ml  Net 1956.25 ml    Exam:   General:  Appears calm and comfortable.  Cardiovascular: Regular rate and rhythm. No murmur, rub, gallop.  Respiratory: Clear to auscultation bilaterally. No wheezes, rales, rhonchi. Normal respiratory effort.  Data Reviewed: Basic Metabolic Panel:  Lab 03/05/12 8119 03/04/12 1100 03/01/12 2010  NA 137 130* 135  K 3.8 4.0 --  CL 106 97 101  CO2 28 29 29   GLUCOSE 103* 107* 124*  BUN 18 18 16   CREATININE 1.50* 1.53* 1.38*  CALCIUM 8.2* 8.5 8.8  MG -- 1.9 --  PHOS -- 2.6 --   Liver Function Tests:  Lab 03/01/12 2010  AST 12  ALT 11  ALKPHOS 73  BILITOT 0.2*  PROT 6.0  ALBUMIN 2.8*   CBC:  Lab 03/05/12 0655 03/04/12 1100 03/01/12 2010  WBC 11.7*  19.5* 15.8*  NEUTROABS -- 17.3* 12.3*  HGB 9.7* 10.0* 10.3*  HCT 30.1* 31.1* 32.2*  MCV 84.6 84.3 85.0  PLT 230 261 208   CBG:  Lab 03/04/12 1057  GLUCAP 105*    Recent Results (from the past 240 hour(s))  CULTURE, BLOOD (ROUTINE X 2)     Status: Normal (Preliminary result)   Collection Time   03/04/12 11:00 AM      Component Value Range Status Comment   Specimen Description BLOOD RIGHT HAND   Final    Special Requests BOTTLES DRAWN AEROBIC AND ANAEROBIC 5CC.   Final    Culture  Setup Time [id number]   Final    Culture     Final    Value:        BLOOD CULTURE RECEIVED NO GROWTH TO DATE CULTURE WILL BE HELD FOR 5 DAYS BEFORE ISSUING A FINAL NEGATIVE REPORT   Report Status PENDING   Incomplete   CULTURE, BLOOD (ROUTINE X 2)     Status: Normal (Preliminary result)   Collection Time   03/04/12 11:00 AM      Component Value Range Status Comment   Specimen Description BLOOD LEFT HAND   Final    Special Requests BOTTLES DRAWN AEROBIC AND ANAEROBIC 5CC.   Final    Culture  Setup Time [id number]   Final    Culture     Final  Value:        BLOOD CULTURE RECEIVED NO GROWTH TO DATE CULTURE WILL BE HELD FOR 5 DAYS BEFORE ISSUING A FINAL NEGATIVE REPORT   Report Status PENDING   Incomplete      Studies: Dg Chest 2 View  03/04/2012  *RADIOLOGY REPORT*  Clinical Data: 56 year old male with fever, tachycardia, shortness of breath.  CHEST - 2 VIEW  Comparison: 02/18/2012 and earlier.  Findings: AP and lateral views of the chest.  Right lower lobe airspace disease.  Small right greater than left pleural effusions. Lower lung volumes.  Cardiac size and mediastinal contours are within normal limits.  Visualized tracheal air column is within normal limits.  No pneumothorax.  No pulmonary edema  IMPRESSION: Increased right lower lung airspace disease compatible with pneumonia or aspiration.  Small right greater than left pleural effusions.  Original Report Authenticated By: Harley Hallmark, M.D.    Scheduled Meds:   . alum & mag hydroxide-simeth      . amLODipine  5 mg Oral Daily  . benztropine  1 mg Oral TID  . carbamazepine  200 mg Oral BID  . chlordiazePOXIDE  25 mg Oral TID  . enoxaparin  40 mg Subcutaneous Q24H  . folic acid  1 mg Oral Daily  . haloperidol  10 mg Oral [id number]  . haloperidol  20 mg Oral QHS  . levofloxacin (LEVAQUIN) IV  750 mg Intravenous Q24H  . nicotine  21 mg Transdermal Q0600  . pantoprazole  40 mg Oral Daily  . pantoprazole  40 mg Oral QHS  . piperacillin-tazobactam (ZOSYN)  IV  3.375 g Intravenous Q8H  . thiamine  100 mg Oral Daily  . traZODone  150 mg Oral QHS  . vancomycin  750 mg Intravenous Q12H  . DISCONTD: chlordiazePOXIDE  25 mg Oral BID   Continuous Infusions:   . sodium chloride 75 mL/hr at 03/04/12 2015     Assessment/Plan: 1. Healthcare acquired pneumonia: Hypoxia resolved. Normal respiratory effort. Narrow antibiotic therapy. 2. Hyponatremia: Resolved. 3. Schizophrenia, paranoid, chronic: Per psychiatry. 4. Hypertension: Stable. Continue amlodipine. 5. 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  Hypoxia resolved. Vital signs stable. Change to oral antibiotic therapy March 7. Anticipate medical stability for transfer to behavioral [facility] March 7.  Code Status: Resolved. Family Communication: mother Marcellina Millin [phone]  Disposition Plan: Return to [facility] when improved.   Brendia Sacks, MD  [facility] Pager [phone] 03/05/2012, 4:58 PM    LOS: 1 day

## 2012-03-06 ENCOUNTER — Encounter (HOSPITAL_COMMUNITY): Payer: Self-pay

## 2012-03-06 ENCOUNTER — Inpatient Hospital Stay (HOSPITAL_COMMUNITY)
Admission: AD | Admit: 2012-03-06 | Discharge: 2012-03-09 | DRG: 885 | Disposition: A | Discharge status: Home / Self Care | Payer: Medicare Other | Source: Ambulatory Visit | Attending: Emergency Medicine | Admitting: Emergency Medicine

## 2012-03-06 DIAGNOSIS — F200 Paranoid schizophrenia: Secondary | ICD-10-CM | POA: Diagnosis present

## 2012-03-06 DIAGNOSIS — J189 Pneumonia, unspecified organism: Secondary | ICD-10-CM

## 2012-03-06 HISTORY — DX: Pneumonia, unspecified organism: J18.9

## 2012-03-06 MED ORDER — TRAZODONE HCL 150 MG PO TABS: 150.0000 mg | ORAL_TABLET | Freq: Every day | ORAL | Status: DC

## 2012-03-06 MED ORDER — HALOPERIDOL 20 MG PO TABS: 20.0000 mg | ORAL_TABLET | Freq: Every day | ORAL | Status: DC

## 2012-03-06 MED ORDER — ACETAMINOPHEN 325 MG PO TABS
650.0000 mg | ORAL_TABLET | Freq: Four times a day (QID) | ORAL | Status: DC | PRN
  Administered 2012-03-06: 650 mg via ORAL

## 2012-03-06 MED ORDER — FOLIC ACID 1 MG PO TABS
1.0000 mg | ORAL_TABLET | Freq: Every day | ORAL | Status: DC
  Filled 2012-03-06 (×2): qty 1

## 2012-03-06 MED ORDER — CARBAMAZEPINE 200 MG PO TABS: 200.0000 mg | ORAL_TABLET | Freq: Two times a day (BID) | ORAL | Status: DC

## 2012-03-06 MED ORDER — BENZTROPINE MESYLATE 1 MG PO TABS
1.0000 mg | ORAL_TABLET | Freq: Three times a day (TID) | ORAL | Status: DC
  Administered 2012-03-06 – 2012-03-07 (×3): 1 mg via ORAL
  Filled 2012-03-06 (×7): qty 1

## 2012-03-06 MED ORDER — HALOPERIDOL 5 MG PO TABS
10.0000 mg | ORAL_TABLET | ORAL | Status: DC
  Administered 2012-03-07 – 2012-03-09 (×5): 10 mg via ORAL
  Filled 2012-03-06 (×8): qty 2

## 2012-03-06 MED ORDER — HALOPERIDOL 5 MG PO TABS
20.0000 mg | ORAL_TABLET | Freq: Every day | ORAL | Status: DC
  Administered 2012-03-06 – 2012-03-08 (×3): 20 mg via ORAL
  Filled 2012-03-06 (×5): qty 4

## 2012-03-06 MED ORDER — PANTOPRAZOLE SODIUM 40 MG PO TBEC
40.0000 mg | DELAYED_RELEASE_TABLET | Freq: Every day | ORAL | Status: DC
  Filled 2012-03-06 (×2): qty 1

## 2012-03-06 MED ORDER — FOLIC ACID 1 MG PO TABS: 1.0000 mg | ORAL_TABLET | Freq: Every day | ORAL | Status: DC

## 2012-03-06 MED ORDER — AMLODIPINE BESYLATE 5 MG PO TABS
5.0000 mg | ORAL_TABLET | Freq: Every day | ORAL | Status: DC
  Filled 2012-03-06 (×2): qty 1

## 2012-03-06 MED ORDER — LEVOFLOXACIN 750 MG PO TABS: 750.0000 mg | ORAL_TABLET | Freq: Every day | ORAL | Status: DC

## 2012-03-06 MED ORDER — AMLODIPINE BESYLATE 5 MG PO TABS
5.0000 mg | ORAL_TABLET | Freq: Every day | ORAL | Status: DC
  Administered 2012-03-07 – 2012-03-09 (×3): 5 mg via ORAL
  Filled 2012-03-06 (×6): qty 1

## 2012-03-06 MED ORDER — HALOPERIDOL 10 MG PO TABS: 10.0000 mg | ORAL_TABLET | ORAL | Status: DC

## 2012-03-06 MED ORDER — CHLORDIAZEPOXIDE HCL 25 MG PO CAPS
25.0000 mg | ORAL_CAPSULE | Freq: Three times a day (TID) | ORAL | Status: DC
  Administered 2012-03-06 – 2012-03-07 (×3): 25 mg via ORAL
  Filled 2012-03-06 (×3): qty 1

## 2012-03-06 MED ORDER — THIAMINE HCL 100 MG PO TABS: 100.0000 mg | ORAL_TABLET | Freq: Every day | ORAL | Status: DC

## 2012-03-06 MED ORDER — MAGNESIUM HYDROXIDE 400 MG/5ML PO SUSP: 30.0000 mL | Freq: Every day | ORAL | Status: DC | PRN

## 2012-03-06 MED ORDER — BENZTROPINE MESYLATE 1 MG PO TABS: 1.0000 mg | ORAL_TABLET | Freq: Three times a day (TID) | ORAL | Status: DC

## 2012-03-06 MED ORDER — NICOTINE 21 MG/24HR TD PT24
21.0000 mg | MEDICATED_PATCH | Freq: Every day | TRANSDERMAL | Status: DC
Start: 2012-03-07 — End: 2012-03-09
  Administered 2012-03-09: 21 mg via TRANSDERMAL
  Filled 2012-03-06 (×5): qty 1

## 2012-03-06 MED ORDER — LEVOFLOXACIN 500 MG PO TABS
750.0000 mg | ORAL_TABLET | Freq: Every day | ORAL | Status: DC
  Administered 2012-03-06 – 2012-03-09 (×4): 750 mg via ORAL
  Filled 2012-03-06 (×5): qty 1
  Filled 2012-03-06: qty 2
  Filled 2012-03-06: qty 1

## 2012-03-06 MED ORDER — VITAMIN B-1 100 MG PO TABS
100.0000 mg | ORAL_TABLET | Freq: Every day | ORAL | Status: DC
  Filled 2012-03-06 (×2): qty 1

## 2012-03-06 MED ORDER — CARBAMAZEPINE 200 MG PO TABS
200.0000 mg | ORAL_TABLET | Freq: Two times a day (BID) | ORAL | Status: DC
  Administered 2012-03-06 – 2012-03-07 (×2): 200 mg via ORAL
  Filled 2012-03-06 (×6): qty 1

## 2012-03-06 MED ORDER — LEVOFLOXACIN 750 MG PO TABS
750.0000 mg | ORAL_TABLET | Freq: Every day | ORAL | Status: DC
  Filled 2012-03-06 (×2): qty 1

## 2012-03-06 MED ORDER — ZOLPIDEM TARTRATE 5 MG PO TABS: 5.0000 mg | ORAL_TABLET | Freq: Every evening | ORAL | Status: DC | PRN

## 2012-03-06 MED ORDER — PANTOPRAZOLE SODIUM 40 MG PO TBEC
40.0000 mg | DELAYED_RELEASE_TABLET | Freq: Every day | ORAL | Status: DC
  Administered 2012-03-07: 40 mg via ORAL
  Filled 2012-03-06 (×2): qty 1

## 2012-03-06 MED ORDER — ZOLPIDEM TARTRATE 5 MG PO TABS
5.0000 mg | ORAL_TABLET | Freq: Every evening | ORAL | Status: DC | PRN
  Administered 2012-03-08: 5 mg via ORAL
  Filled 2012-03-06: qty 1

## 2012-03-06 MED ORDER — ALUM & MAG HYDROXIDE-SIMETH 200-200-20 MG/5ML PO SUSP
30.0000 mL | ORAL | Status: DC | PRN
  Administered 2012-03-09: 30 mL via ORAL

## 2012-03-06 MED ORDER — AMLODIPINE BESYLATE 5 MG PO TABS: 5.0000 mg | ORAL_TABLET | Freq: Every day | ORAL | Status: DC

## 2012-03-06 MED ORDER — SODIUM CHLORIDE 0.9 % IN NEBU: 3.0000 mL | INHALATION_SOLUTION | Freq: Three times a day (TID) | RESPIRATORY_TRACT | Status: DC | PRN

## 2012-03-06 MED ORDER — NICOTINE 21 MG/24HR TD PT24: 1.0000 | MEDICATED_PATCH | Freq: Every day | TRANSDERMAL | Status: DC

## 2012-03-06 MED ORDER — CHLORDIAZEPOXIDE HCL 25 MG PO CAPS: 25.0000 mg | ORAL_CAPSULE | Freq: Three times a day (TID) | ORAL | Status: DC

## 2012-03-06 MED ORDER — TRAZODONE HCL 100 MG PO TABS
150.0000 mg | ORAL_TABLET | Freq: Every day | ORAL | Status: DC
  Administered 2012-03-06 – 2012-03-08 (×3): 150 mg via ORAL
  Filled 2012-03-06 (×2): qty 1
  Filled 2012-03-06: qty 3
  Filled 2012-03-06 (×2): qty 1

## 2012-03-06 MED ORDER — FOLIC ACID 1 MG PO TABS
1.0000 mg | ORAL_TABLET | Freq: Every day | ORAL | Status: DC
  Administered 2012-03-07 – 2012-03-09 (×3): 1 mg via ORAL
  Filled 2012-03-06 (×5): qty 1

## 2012-03-06 MED ORDER — VITAMIN B-1 100 MG PO TABS
100.0000 mg | ORAL_TABLET | Freq: Every day | ORAL | Status: DC
  Administered 2012-03-07 – 2012-03-09 (×3): 100 mg via ORAL
  Filled 2012-03-06 (×5): qty 1

## 2012-03-06 NOTE — Progress Notes (Signed)
Nutrition Brief Note  - Pt screened for needing a dietitian consult, however pt's intake has been excellent with 100% meal intake per RN in addition to drinking Ensure. Pt currently in hallway eating a snack without any needs.  Noted pt with history of alcohol abuse and is on folic acid and thiamine. No nutrition diagnosis at this time.  Dietitian # [phone]

## 2012-03-06 NOTE — Progress Notes (Signed)
Pt. Readmitted to [facility] for continued treatment.  Pt. Has been treated at [facility] with antibiotics for pneumonia.    Pt. Denies SI/HI and denies A/V hallucinations and Contracts for safety.   Pt. Delusional and reports that he has been working and  inspected the building across the street.  Pt.  Denied offer of food.  Fluids given.  Support given.

## 2012-03-06 NOTE — Progress Notes (Signed)
Per MD, Pt medically ready to return to [facility].  Notified [facility].  [facility] will assess bed availability and notify CSW if bed available today.  CSW to continue to follow.  Providence Crosby, LCSWA Clinical Social Work [phone]

## 2012-03-06 NOTE — H&P (Signed)
Psychiatric Admission Assessment Adult  Patient Identification:  David [name] Date of Evaluation:  03/06/2012 56yo SWM History of Present Illness::  Returning from a 55 hour hospitalization for healthcare acquired pneumonia.He was noted to be less responsive with some cough and respiratory distress on the 5th. A CXR was compared with studies on original admission 2/18 and it was felt that he had R greater than L pleural effusions . The R lower lung airspace disease was felt to be compatible with pneumonia or aspiration. Blood cultures had no growth and broad spectrum coverage with Levaquin 750 mg po daily was initiated -to finish on March 12th.     Originally admitted to [facility] 2/18 -psychotic due to non-compliance and THC abuse.   Past Psychiatric History: Diagnosed as schizophrenic at age 12 numerous hospitalizations through the years. HAd erecently been receiving haldol Dec monthly and Librium as he also abuses alcohol.  Substance Abuse History: UDS was + THC and he was prescribed Librium due to alcoho labuse   Social History:    reports that he has been smoking Cigarettes.  He has a 60 pack-year smoking history. He has never used smokeless tobacco. He reports that he drinks alcohol. He reports that he uses illicit drugs (Cocaine and Marijuana). Says he lives alone and wants to go home now. Family Psych History: Unknown   Recent medical diagnoses from inpatient stay 3/5- 03/06/12  Probabale chronic kidney disease stage III Normocytic anaemia -follow up as outpatient Hyponatremia -resolved  Past Medical History:     Past Medical History  Diagnosis Date  . Schizophrenia   . Hypertension   . Pneumonia        Past Surgical History  Procedure Date  . Tonsillectomy     Allergies:  Allergies  Allergen Reactions  . Lithium Other (See Comments)    Current Medications:  Prior to Admission medications   Medication Sig Start Date End Date Taking? Authorizing Provider  amLODipine  (NORVASC) 5 MG tablet Take 1 tablet (5 mg total) by mouth daily. 03/06/12 03/06/13  Brendia Sacks, MD  benztropine (COGENTIN) 1 MG tablet Take 1 tablet (1 mg total) by mouth 3 (three) times daily. 03/06/12 03/06/13  Brendia Sacks, MD  carbamazepine (TEGRETOL) 200 MG tablet Take 1 tablet (200 mg total) by mouth 2 (two) times daily. 03/06/12 03/06/13  Brendia Sacks, MD  chlordiazePOXIDE (LIBRIUM) 25 MG capsule Take 1 capsule (25 mg total) by mouth 3 (three) times daily. 03/06/12 04/05/12  Brendia Sacks, MD  folic acid (FOLVITE) 1 MG tablet Take 1 tablet (1 mg total) by mouth daily. 03/06/12 03/06/13  Brendia Sacks, MD  haloperidol (HALDOL) 10 MG tablet Take 1 tablet (10 mg total) by mouth 2 (two) times daily at 8am and 2pm. 03/06/12 04/05/12  Brendia Sacks, MD  haloperidol (HALDOL) 20 MG tablet Take 1 tablet (20 mg total) by mouth at bedtime. 03/06/12 04/05/12  Brendia Sacks, MD  levofloxacin (LEVAQUIN) 750 MG tablet Take 1 tablet (750 mg total) by mouth daily. Last dose March 12. 03/06/12 03/16/12  Brendia Sacks, MD  nicotine (NICODERM CQ - DOSED IN MG/24 HOURS) 21 mg/24hr patch Place 1 patch onto the skin daily at 6 (six) AM. 03/06/12 04/05/12  Brendia Sacks, MD  pantoprazole (PROTONIX) 40 MG tablet Take 40 mg by mouth daily.    Historical Provider, MD  thiamine 100 MG tablet Take 1 tablet (100 mg total) by mouth daily. 03/06/12 03/06/13  Brendia Sacks, MD  traZODone (DESYREL) 150 MG tablet Take 1 tablet (150  mg total) by mouth at bedtime. 03/06/12 04/05/12  Brendia Sacks, MD  zolpidem (AMBIEN) 5 MG tablet Take 1 tablet (5 mg total) by mouth at bedtime as needed for sleep (insomnia). 03/06/12 03/06/13  Brendia Sacks, MD    Mental Status Examination/Evaluation: Objective:  Appearance: Fairly Groomed  Psychomotor Activity:  Normal  Eye Contact::  Fair  Speech:  Clear and Coherent  Volume:  Normal  Mood: somewhat irritable but can be redirected    Affect:  Congruent  Thought Process:  Clear rational goal oriented-  discharge home   Orientation:  Full  Thought Content:  No AVH or frank paranoia   Suicidal Thoughts:  No  Homicidal Thoughts:  No  Judgement:  Poor  Insight:  Fair    DIAGNOSIS:    AXIS I Schizophrenia -exacerbation due to non-compliance & THC abuse   AXIS II Deferred  AXIS III See medical history.  AXIS IV economic problems and other psychosocial or environmental problems  AXIS V 51-60 moderate symptoms     Treatment Plan Summary: Re-admit to [facility] continue with care and plan for discharge  Continue Levaquin 750 mg po daily finish 3/12 for healthcare  acquired pneumonia Provide respiratory support with normal saline nebulizer   Mickie Deery Sheryn Aldaz PA-C

## 2012-03-06 NOTE — Discharge Summary (Signed)
Physician Discharge Summary  [place] ZOX:[id number] DOB: 01-Nov-1956 DOA: 03/04/2012  PCP: Sheila Oats, MD, MD  Admit date: 03/04/2012 Discharge date: 03/06/2012  Discharge Diagnoses:  1. Healthcare acquired pneumonia 2. Hyponatremia, resolved 3. Hypertension, stable 4. Probable chronic kidney disease stage III, stable 5. Normocytic anemia, stable 6. Paranoid schizophrenia  Discharge Condition: Improved  Disposition: Return to [facility] for further inpatient psychiatric treatment.  History of present illness:  56 year old man initially admitted to [facility] for schizophrenia, subsequently transferred to [facility] long hospital for confusion. Patient noted be lethargic, confused and in respiratory distress. Fundi have pneumonia and admitted for further evaluation.  Hospital Course:  Mr. Goehring was admitted to medical floor and treated with broad-spectrum antibiotics. His condition quickly improved and he was subsequently transitioned to oral therapy. He is much improved and stable for return to [facility] today. These and other issues as outlined below. 1. Healthcare acquired pneumonia: Hypoxia resolved. Normal respiratory effort. Continue empiric antibiotic therapy. Last dose March 12. 2. Hyponatremia: Resolved.  3. Schizophrenia, paranoid, chronic: Per psychiatry.  4. Hypertension: Stable. Continue amlodipine.  5. 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  6. Normocytic anemia: Stable. Followup as outpatient.  Consultants:  Psychiatry  Procedures:  None  Antibiotics:  March 5: Levaquin   March 5-6: Zosyn   March 5-6: Vancomycin  Discharge Instructions  Discharge Orders    Future Orders Please Complete By Expires   Diet general      Activity as tolerated - No restrictions        Medication List  As of 03/06/2012  3:44 PM   STOP taking these medications         diazepam 10 MG tablet      haloperidol  lactate 5 MG/ML injection         TAKE these medications         amLODipine 5 MG tablet   Commonly known as: NORVASC   Take 1 tablet (5 mg total) by mouth daily.      benztropine 1 MG tablet   Commonly known as: COGENTIN   Take 1 tablet (1 mg total) by mouth 3 (three) times daily.      carbamazepine 200 MG tablet   Commonly known as: TEGRETOL   Take 1 tablet (200 mg total) by mouth 2 (two) times daily.      chlordiazePOXIDE 25 MG capsule   Commonly known as: LIBRIUM   Take 1 capsule (25 mg total) by mouth 3 (three) times daily.      folic acid 1 MG tablet   Commonly known as: FOLVITE   Take 1 tablet (1 mg total) by mouth daily.      haloperidol 10 MG tablet   Commonly known as: HALDOL   Take 1 tablet (10 mg total) by mouth 2 (two) times daily at 8am and 2pm.      haloperidol 20 MG tablet   Commonly known as: HALDOL   Take 1 tablet (20 mg total) by mouth at bedtime.      levofloxacin 750 MG tablet   Commonly known as: LEVAQUIN   Take 1 tablet (750 mg total) by mouth daily. Last dose March 12.      nicotine 21 mg/24hr patch   Commonly known as: NICODERM CQ - dosed in mg/24 hours   Place 1 patch onto the skin daily at 6 (six) AM.      pantoprazole 40 MG tablet   Commonly known as: PROTONIX  Take 40 mg by mouth daily.      thiamine 100 MG tablet   Take 1 tablet (100 mg total) by mouth daily.      traZODone 150 MG tablet   Commonly known as: DESYREL   Take 1 tablet (150 mg total) by mouth at bedtime.      zolpidem 5 MG tablet   Commonly known as: AMBIEN   Take 1 tablet (5 mg total) by mouth at bedtime as needed for sleep (insomnia).           Follow-up Information    Follow up with DEFAULT,PROVIDER, MD .          The results of significant diagnostics from this hospitalization (including imaging, microbiology, ancillary and laboratory) are listed below for reference.    Significant Diagnostic Studies: Dg Chest 2 View  03/04/2012  *RADIOLOGY REPORT*   Clinical Data: 56 year old male with fever, tachycardia, shortness of breath.  CHEST - 2 VIEW  Comparison: 02/18/2012 and earlier.  Findings: AP and lateral views of the chest.  Right lower lobe airspace disease.  Small right greater than left pleural effusions. Lower lung volumes.  Cardiac size and mediastinal contours are within normal limits.  Visualized tracheal air column is within normal limits.  No pneumothorax.  No pulmonary edema  IMPRESSION: Increased right lower lung airspace disease compatible with pneumonia or aspiration.  Small right greater than left pleural effusions.  Original Report Authenticated By: Harley Hallmark, M.D.   Microbiology: Recent Results (from the past 240 hour(s))  CULTURE, BLOOD (ROUTINE X 2)     Status: Normal (Preliminary result)   Collection Time   03/04/12 11:00 AM      Component Value Range Status Comment   Specimen Description BLOOD RIGHT HAND   Final    Special Requests BOTTLES DRAWN AEROBIC AND ANAEROBIC 5CC.   Final    Culture  Setup Time [id number]   Final    Culture     Final    Value:        BLOOD CULTURE RECEIVED NO GROWTH TO DATE CULTURE WILL BE HELD FOR 5 DAYS BEFORE ISSUING A FINAL NEGATIVE REPORT   Report Status PENDING   Incomplete   CULTURE, BLOOD (ROUTINE X 2)     Status: Normal (Preliminary result)   Collection Time   03/04/12 11:00 AM      Component Value Range Status Comment   Specimen Description BLOOD LEFT HAND   Final    Special Requests BOTTLES DRAWN AEROBIC AND ANAEROBIC 5CC.   Final    Culture  Setup Time [id number]   Final    Culture     Final    Value:        BLOOD CULTURE RECEIVED NO GROWTH TO DATE CULTURE WILL BE HELD FOR 5 DAYS BEFORE ISSUING A FINAL NEGATIVE REPORT   Report Status PENDING   Incomplete      Labs: Basic Metabolic Panel:  Lab 03/05/12 4696 03/04/12 1100 03/01/12 2010  NA 137 130* 135  K 3.8 4.0 --  CL 106 97 101  CO2 28 29 29   GLUCOSE 103* 107* 124*  BUN 18 18 16   CREATININE 1.50* 1.53* 1.38*    CALCIUM 8.2* 8.5 8.8  MG -- 1.9 --  PHOS -- 2.6 --   Liver Function Tests:  Lab 03/01/12 2010  AST 12  ALT 11  ALKPHOS 73  BILITOT 0.2*  PROT 6.0  ALBUMIN 2.8*   CBC:  Lab 03/05/12 0655 03/04/12  1100 03/01/12 2010  WBC 11.7* 19.5* 15.8*  NEUTROABS -- 17.3* 12.3*  HGB 9.7* 10.0* 10.3*  HCT 30.1* 31.1* 32.2*  MCV 84.6 84.3 85.0  PLT 230 261 208   CBG:  Lab 03/04/12 1057  GLUCAP 105*    Time coordinating discharge: 25 minutes.  Signed:  Brendia Sacks, MD  [facility] 03/06/2012, 3:44 PM

## 2012-03-06 NOTE — Progress Notes (Signed)
Per [facility], Pt has been accepted, however there are no beds available at this time.  Notified MD.  Sherron Monday with Pt's mom who requested to meet with CSW today at 1430.  CSW to continue to follow.  Providence Crosby, LCSWA Clinical Social Work [phone]

## 2012-03-06 NOTE — Progress Notes (Signed)
PROGRESS NOTE  David [name] ZOX:[id number] DOB: October 17, 1956 DOA: 03/04/2012 PCP: Sheila Oats, MD, MD  Brief narrative: 56 year old man initially admitted to [facility] for schizophrenia, subsequently transferred to [facility] long hospital for confusion. Patient noted be lethargic, confused and in respiratory distress. Fundi have pneumonia and admitted for further evaluation.  Past medical history: Paranoid schizophrenia, GERD, hypertension, alcohol abuse  Consultants:  Psychiatry  Procedures:  None  Antibiotics:  March 5: Levaquin  March 5-6: Zosyn  March 5-6: Vancomycin  Interim History: Interval documentation reviewed. Patient has a bed today behavioral health. Discussed with RN. No concerns voiced.  Subjective: No complaints.  Objective: Filed Vitals:   03/05/12 0535 03/05/12 1331 03/05/12 2125 03/06/12 0523  BP: 104/64 130/70 124/75 129/80  Pulse: 86 105 95 86  Temp: 98.2 F (36.8 C) 97.8 F (36.6 C) 98.3 F (36.8 C) 98.2 F (36.8 C)  TempSrc: Oral Oral Oral Oral  Resp: 20 20 19 18   Height:      Weight:      SpO2: 93% 94% 93% 92%    Intake/Output Summary (Last 24 hours) at 03/06/12 1102 Last data filed at 03/05/12 2115  Gross per 24 hour  Intake 2156.25 ml  Output    550 ml  Net 1606.25 ml    Exam:   General:  Appears calm and comfortable.  Cardiovascular: Regular rate and rhythm. No murmur, rub, gallop.  Respiratory: Clear to auscultation bilaterally. No wheezes, rales, rhonchi. Normal respiratory effort.  Data Reviewed: Basic Metabolic Panel:  Lab 03/05/12 8119 03/04/12 1100 03/01/12 2010  NA 137 130* 135  K 3.8 4.0 --  CL 106 97 101  CO2 28 29 29   GLUCOSE 103* 107* 124*  BUN 18 18 16   CREATININE 1.50* 1.53* 1.38*  CALCIUM 8.2* 8.5 8.8  MG -- 1.9 --  PHOS -- 2.6 --   Liver Function Tests:  Lab 03/01/12 2010  AST 12  ALT 11  ALKPHOS 73  BILITOT 0.2*  PROT 6.0  ALBUMIN 2.8*   CBC:  Lab 03/05/12 0655 03/04/12  1100 03/01/12 2010  WBC 11.7* 19.5* 15.8*  NEUTROABS -- 17.3* 12.3*  HGB 9.7* 10.0* 10.3*  HCT 30.1* 31.1* 32.2*  MCV 84.6 84.3 85.0  PLT 230 261 208   CBG:  Lab 03/04/12 1057  GLUCAP 105*    Recent Results (from the past 240 hour(s))  CULTURE, BLOOD (ROUTINE X 2)     Status: Normal (Preliminary result)   Collection Time   03/04/12 11:00 AM      Component Value Range Status Comment   Specimen Description BLOOD RIGHT HAND   Final    Special Requests BOTTLES DRAWN AEROBIC AND ANAEROBIC 5CC.   Final    Culture  Setup Time [id number]   Final    Culture     Final    Value:        BLOOD CULTURE RECEIVED NO GROWTH TO DATE CULTURE WILL BE HELD FOR 5 DAYS BEFORE ISSUING A FINAL NEGATIVE REPORT   Report Status PENDING   Incomplete   CULTURE, BLOOD (ROUTINE X 2)     Status: Normal (Preliminary result)   Collection Time   03/04/12 11:00 AM      Component Value Range Status Comment   Specimen Description BLOOD LEFT HAND   Final    Special Requests BOTTLES DRAWN AEROBIC AND ANAEROBIC 5CC.   Final    Culture  Setup Time [id number]   Final    Culture     Final  Value:        BLOOD CULTURE RECEIVED NO GROWTH TO DATE CULTURE WILL BE HELD FOR 5 DAYS BEFORE ISSUING A FINAL NEGATIVE REPORT   Report Status PENDING   Incomplete      Studies: Dg Chest 2 View  03/04/2012  *RADIOLOGY REPORT*  Clinical Data: 56 year old male with fever, tachycardia, shortness of breath.  CHEST - 2 VIEW  Comparison: 02/18/2012 and earlier.  Findings: AP and lateral views of the chest.  Right lower lobe airspace disease.  Small right greater than left pleural effusions. Lower lung volumes.  Cardiac size and mediastinal contours are within normal limits.  Visualized tracheal air column is within normal limits.  No pneumothorax.  No pulmonary edema  IMPRESSION: Increased right lower lung airspace disease compatible with pneumonia or aspiration.  Small right greater than left pleural effusions.  Original Report Authenticated  By: Ulla Potash III, M.D.   Scheduled Meds:    . amLODipine  5 mg Oral Daily  . benztropine  1 mg Oral TID  . carbamazepine  200 mg Oral BID  . chlordiazePOXIDE  25 mg Oral TID  . enoxaparin  40 mg Subcutaneous Q24H  . folic acid  1 mg Oral Daily  . haloperidol  10 mg Oral [id number]  . haloperidol  20 mg Oral QHS  . levofloxacin (LEVAQUIN) IV  750 mg Intravenous Q24H  . nicotine  21 mg Transdermal Q0600  . pantoprazole  40 mg Oral Daily  . pantoprazole  40 mg Oral QHS  . thiamine  100 mg Oral Daily  . traZODone  150 mg Oral QHS  . DISCONTD: piperacillin-tazobactam (ZOSYN)  IV  3.375 g Intravenous Q8H  . DISCONTD: vancomycin  750 mg Intravenous Q12H   Continuous Infusions:    . DISCONTD: sodium chloride 75 mL/hr at 03/04/12 2015     Assessment/Plan: 1. Healthcare acquired pneumonia: Hypoxia resolved. Normal respiratory effort. Continue empiric antibiotic therapy. 2. Hyponatremia: Resolved. 3. Schizophrenia, paranoid, chronic: Per psychiatry. 4. Hypertension: Stable. Continue amlodipine. 5. 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 6. Normocytic anemia: Stable. Followup as outpatient.  Medically stable for transfer to [facility] today.  Code Status: Resolved. Family Communication: mother Marcellina Millin [phone]  Disposition Plan: Return to [facility].   Brendia Sacks, MD  [facility] Pager [phone] 03/06/2012, 11:02 AM    LOS: 2 days

## 2012-03-06 NOTE — Tx Team (Signed)
Initial Interdisciplinary Treatment Plan  PATIENT STRENGTHS: (choose at least two) Supportive family/friends  PATIENT STRESSORS: Medication change or noncompliance   PROBLEM LIST: Problem List/Patient Goals Date to be addressed Date deferred Reason deferred Estimated date of resolution  Schizophrenia   03/06/12     Paranoid 03/06/12                                                DISCHARGE CRITERIA:  Improved stabilization in mood, thinking, and/or behavior Medical problems require only outpatient monitoring Motivation to continue treatment in a less acute level of care Need for constant or close observation no longer present Reduction of life-threatening or endangering symptoms to within safe limits Verbal commitment to aftercare and medication compliance  PRELIMINARY DISCHARGE PLAN: Return to previous living arrangement  PATIENT/FAMIILY INVOLVEMENT: This treatment plan has been presented to and reviewed with the patient, David [name], and/or family member, .  The patient and family have been given the opportunity to ask questions and make suggestions.  David [name] 03/06/2012, 6:30 PM

## 2012-03-06 NOTE — Progress Notes (Signed)
Spoke with Pt and his mom at bedside.    Pt's mom provided CSW with contact information at [facility] and asked if CSW could assist her in obtaining a letter from MD stating that Pt had functioned well for the past 6 years on Librium and asking them to reconsider coverage from this drug.  CSW to follow up with MD re: letter.  Per [facility], bed available for Pt today at 1600.    Notified Pt, RN, Pt's mom and MD.  Pt signed consent.  Faxed consent.  Pt to be d/c'd.  Providence Crosby, LCSWA Clinical Social Work [phone]

## 2012-03-07 ENCOUNTER — Other Ambulatory Visit: Payer: Self-pay

## 2012-03-07 DIAGNOSIS — F200 Paranoid schizophrenia: Principal | ICD-10-CM

## 2012-03-07 MED ORDER — HALOPERIDOL DECANOATE 100 MG/ML IM SOLN
150.0000 mg | Freq: Once | INTRAMUSCULAR | Status: AC
  Administered 2012-03-07: 150 mg via INTRAMUSCULAR
  Filled 2012-03-07: qty 1.5

## 2012-03-07 MED ORDER — PANTOPRAZOLE SODIUM 40 MG PO TBEC
40.0000 mg | DELAYED_RELEASE_TABLET | Freq: Every day | ORAL | Status: DC
  Administered 2012-03-08: 40 mg via ORAL
  Filled 2012-03-07 (×2): qty 1

## 2012-03-07 MED ORDER — CHLORDIAZEPOXIDE HCL 25 MG PO CAPS
25.0000 mg | ORAL_CAPSULE | ORAL | Status: DC
  Administered 2012-03-07 – 2012-03-09 (×6): 25 mg via ORAL
  Filled 2012-03-07 (×6): qty 1

## 2012-03-07 MED ORDER — CARBAMAZEPINE 200 MG PO TABS
200.0000 mg | ORAL_TABLET | ORAL | Status: DC
  Administered 2012-03-07 – 2012-03-09 (×4): 200 mg via ORAL
  Filled 2012-03-07 (×8): qty 1

## 2012-03-07 MED ORDER — BENZTROPINE MESYLATE 1 MG PO TABS
1.0000 mg | ORAL_TABLET | ORAL | Status: DC
  Administered 2012-03-07 – 2012-03-09 (×5): 1 mg via ORAL
  Filled 2012-03-07 (×10): qty 1

## 2012-03-07 NOTE — Progress Notes (Signed)
Drafted a letter on Dr. Sheela Stack behalf addressed to "[facility]" asking that they reconsider coverage for Pt's Librium.  Dr. Lolly Mustache signed the letter.  Faxed letter to [facility], attn: Sedalia Muta, to [phone].  Providence Crosby, LCSWA Clinical Social Work [phone]

## 2012-03-07 NOTE — Progress Notes (Signed)
Pt is in bed resting with eyes closed   No distress noted   Q 15 min checks  Pt safe at present 

## 2012-03-07 NOTE — Discharge Planning (Addendum)
Met with patient in Aftercare Planning Group.   While he was more pleasant and redirectable during group than before his medical admission, he remained tangential, unfocused, and disruptive.  He interrupted speakers several times to ask how many people can fit in a tank and if the tank can shoot straight.  He did stop being pleasant and became frustrated and sullen when redirected several times, stating that he was not being allowed to talk.  Patient did not request discharge or in any other way indicate any case management needs today.  When CM spoke about [place] needing to talk with his brother again since he lives with his brother, in order to find out if he is close to being ready to go home, he became quite angry and made hand motions that conveyed his agitation, insisting we not call his brother while he is at work.  Ambrose Mantle, LCSW 03/07/2012, 4:52 PM  Per State Regulation 482.30  This chart was reviewed for medical necessity with respect to the patient's Admission/Duration of stay.   Next review due:  03/10/12   Ambrose Mantle, LCSW  03/07/2012  4:52 PM

## 2012-03-07 NOTE — Tx Team (Signed)
Interdisciplinary Treatment Plan Update (Adult)  Date:  03/07/2012  Time Reviewed:  10:15AM-11:00AM  Progress in Treatment: Attending groups:  Yes Participating in groups:    To extent possible with his psychosis, is often disruptive Taking medication as prescribed:    Yes Tolerating medication:   Yes Family/Significant other contact made:  Yes, with brother Patient understands diagnosis:   Yes, with poor insight and judgment Discussing patient identified problems/goals with staff:   No Medical problems stabilized or resolved:   Yes Denies suicidal/homicidal ideation:  Yes Issues/concerns per patient self-inventory:   None Other:    New problem(s) identified: No, Describe:    Reason for Continuation of Hospitalization: Hallucinations Other; describe paranoia, brother says is not at baseline  Interventions implemented related to continuation of hospitalization:  Medication monitoring and adjustment, safety checks Q15 min., suicide risk assessment, group therapy, psychoeducation, collateral contact, aftercare planning, ongoing physician assessments, medication education  Additional comments:  Not applicable  Estimated length of stay:  3-4 days  Discharge Plan:  Return to live with his brother and his family, follow up at [facility]):  Not applicable  Review of initial/current patient goals per problem list:   1.  Goal(s):  Reduce psychotic symptoms (hallucinations) to baseline.  Met:  No  Target date:  By Discharge   As evidenced by:  Patient still seems to respond to internal stimuli, and when asked if he hears voices, does not answer but just starts laughing  2.  Goal(s):  Reduce paranoia to baseline.  Met:  No  Target date:  By Discharge   As evidenced by:  States that [facility] owes him $22 million and if we don't give it to him, trails off.  3.  Goal(s):  Deny SI / HI for 48 hours prior to D/C.   Met:  No  Target date:  By Discharge   As  evidenced by:  Although tells Dr. Today that he does not have any SI/HI, told RN earlier that he has HI today.  4.  Goal(s):  Ensure that aftercare plan is in place.  Met:  No  Target date:  By Discharge   As evidenced by:  Still needs referral to [facility], which can be made closer to discharge date.  Attendees: Patient:  David [name]  03/07/2012 10:15AM-11:00AM  Family:     Physician:  Dr. Harvie Heck Readling 03/07/2012 10:15AM-11:00AM  Nursing:   Edwyna Shell, RN 03/07/2012 10:15AM -11:00AM   Case Manager:  Ambrose Mantle, LCSW 03/07/2012 10:15AM-11:00AM  Counselor:  Veto Kemps, MT-BC 03/07/2012 10:15AM-11:00AM  Other:   Lynann Bologna, NP 03/07/2012 10:15AM-11:00AM  Other:      Other:      Other:       Scribe for Treatment Team:   Sarina Ser, 03/07/2012, 10:15AM-11:15AM

## 2012-03-07 NOTE — Progress Notes (Signed)
Pt is disorganized in thinking with labile mood and affect. Pt's affect changes from flat to smiling. He answers "yes" to question of HI and will not give any information about hi thoughts. When asked about hallucinations, pt responded "I am going to defect." He also talked about "big money credit" and "[employer]." Offered support and medication. Safety maintained on unit.

## 2012-03-07 NOTE — Discharge Summary (Signed)
Physician Discharge Summary Note  Patient:  David [name] is an 56 y.o., male MRN:  [id number] DOB:  1956/01/26 Patient phone:  [phone] (home)  Patient address:   [address] [place] [address],   Date of Admission:  02/18/2012 Date of Discharge: 03/03/2012   Discharge Diagnoses: Principal Problem:  *Schizophrenia, paranoid, chronic   Axis Diagnosis:   AXIS I:  Schizophrenia, Paranoid type, Chronic with Acute Exacerbation AXIS II:  No diagnosis AXIS III:  Pneumonia Past Medical History  Diagnosis Date  . Schizophrenia   . Hypertension   . Pneumonia    AXIS IV:  Supportive family and stable home is an asset AXIS V:  41-50 serious symptoms  Level of Care:  Transferred to [facility] for admission for acute pneumonia  Hospital Course:  David [name] initially presented by way of the emergency room on an involuntary petition taken by his family for an acute exacerbation of his schizophrenia. He been very stable on 150 mg of Haldol monthly, and Librium 25 mg twice daily for several years. About 2 weeks prior to admission he became quite psychotic and disorganized for reasons that have never been clear. He had received his Haldol decanoate injection on [name] regular basis and had not missed any medications, except for [name] couple days of missing Librium. He also had had [name] death in his family, and his family was concerned that he may be having [name] grief reaction.  He was admitted to her acute stabilization unit and was restarted on medications. Oral haldol was ordered and his previous dose of Haldol decanoate was validated. His medications were unchanged. He presented disorganized with pressured speech cursing, very impulsive, difficult to redirect at times and verbally aggressive. Depakote was added at one point but discontinued because it caused nausea. Tegretol was added and this helped decrease his impulsivity aggressiveness, but thoughts remained somewhat disorganized but improved.  On March  4. He was found to be febrile, ill appearing with [name] poor appetite. His white count was elevated at 15,000, in [name] new chest x-ray revealed pneumonia. He was transferred to [facility] long for medical treatment.  Consults:  None  Discharge Vitals:   Blood pressure 112/74, pulse 108, temperature 98.2 F (36.8 C), temperature source Oral, resp. rate 16, height 5\' 10"  (1.778 m), weight 79.379 kg (175 lb).  Mental Status Exam: See Mental Status Examination and Suicide Risk Assessment completed by Attending Physician prior to discharge.  Discharge destination:  [facility] emergency Room  Is patient on multiple antipsychotic therapies at discharge:  No   Has Patient had three or more failed trials of antipsychotic monotherapy by history:  No  Recommended Plan for Multiple Antipsychotic Therapies: N/[name]/   Medication list was provided to the emergency room and further medications therapy will be determined by the hospitalist.    chlordiazePOXIDE 25 MG capsule   For anxiety. Commonly known as: LIBRIUM    Take 25 mg by mouth 2 (two) times daily.               haloperidol lactate 5 MG/ML injection    Commonly known as: HALDOL    Inject 150 mg into the skin every 30 (thirty) days.  For psychosis.    pantoprazole 40 MG tablet    Commonly known as: PROTONIX    Take 40 mg by mouth daily.  For GERD.    zolpidem 10 MG tablet    Commonly known as: AMBIEN    Take 10 mg by mouth at bedtime  as needed. For insomnia    Follow-up recommendations:  Activity:  to be determined by hospitalist Diet:  to be determined by hospitalist  Signed: SCOTT,MARGARET [name] 03/07/2012, 3:15 PM

## 2012-03-07 NOTE — Progress Notes (Signed)
[facility] Group Notes:  (Counselor/Nursing/MHT/Case Management/Adjunct)  03/07/2012 2:03 PM  Type of Therapy:  Group Therapy  Participation Level:  Minimal  Participation Quality:  Attentive and Sharing  Affect:  Labile  Cognitive:  Confused  Insight:  None  Engagement in Group:  Limited  Engagement in Therapy:  Limited  Modes of Intervention:  Education, Orientation, Problem-solving and Support  Summary of Progress/Problems: Patient was pleasant, but stated that [facility] owes him millions of dollars. Other conversation was not logical or topic based.   Shajuan Musso, Aram Beecham 03/07/2012, 2:03 PM
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
## 2012-03-07 NOTE — Progress Notes (Signed)
Pt has been up and has been visible in milieu today, pt presents with disorganized thoughts, paranoid and delusional, making statements about people owing him a lot of money, also made some religious references and irritable at times, pt did receive IM haldol decanoate this evening without incident and did receive all po medications without incident, support provided, will continue to monitor

## 2012-03-08 NOTE — Progress Notes (Signed)
Patient ID: David [name], male   DOB: November 04, 1956, 56 y.o.   MRN: [id number]  Pt. attended and participated in aftercare planning group. Pt. accepted information on suicide prevention, warning signs to look for with suicide and crisis line numbers to use. The pt. agreed to call crisis line numbers if having warning signs or having thoughts of suicide. Pt. listed their current anxiety level as 6 and his depression at a 0 on a scale of 1-10. Pt complained about being on too much sleeping medications and that he was feeling bad.

## 2012-03-08 NOTE — Progress Notes (Signed)
Patient ID: Arpan Eskelson, male   DOB: [birth year], 56 y.o.   MRN: [id number] Client is fully alert today, he is in the phone room making a phone call to his family. He greets me with a smile, his affect is full range, and he is pleasant on approach. Says that he slept very well last night denies that he is having any voices today says he ate breakfast and has a good appetite, he denies any suicidal thoughts. Says that he spoke to his father last night, but doesn't have anything to tell me about the conversation.  He received his Haldol decanoate shot yesterday.  As we in the conversation he suddenly says he wants to talk to me later because he wants to $25 million episode him for working so hard while he is here.  Speech is nonpressured, no cursing or expletives. He is directable generally cooperative.  Plan: Discharge planning  We'll try to get some feedback from the family if he is near his baseline.

## 2012-03-08 NOTE — Progress Notes (Signed)
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

## 2012-03-08 NOTE — Progress Notes (Signed)
Patient ID: David [name], male   DOB: 1956/12/17, 56 y.o.   MRN: [id number]   [facility] Group Notes:  (Counselor/Nursing/MHT/Case Management/Adjunct)  03/08/2012 11 AM  Type of Therapy:  Group Therapy, Dance/Movement Therapy   Participation Level:  Minimal  Participation Quality:  Drowsy and Redirectable  Affect:  Labile  Cognitive:  Delusional and Hallucinating  Insight:  Limited  Engagement in Group:  Limited  Engagement in Therapy:  Limited  Modes of Intervention:  Clarification, Problem-solving, Role-play, Socialization and Support  Summary of Progress/Problems: Group discussed how different approaches when addressing fears or new situations can be helpful or harmful. Pt shared that he typically "dives right in" without taking precautions. Throughout group, pt would engage with his hallucinations verbally and physically, and it was time directed towards the counselor. Pt also had difficulty remaining awake. This suggests that the pt's mental state is unstable and he is likely to participate in harmful behavior, which is unhelpful for his recovery.      Thomasena Edis, [employer]

## 2012-03-09 ENCOUNTER — Encounter (HOSPITAL_COMMUNITY): Payer: Self-pay | Admitting: Internal Medicine

## 2012-03-09 ENCOUNTER — Inpatient Hospital Stay (HOSPITAL_COMMUNITY)
Admission: AD | Admit: 2012-03-09 | Discharge: 2012-03-12 | DRG: 194 | Disposition: A | Discharge status: Other Acute Inpatient Hospital | Payer: Medicare Other | Source: Ambulatory Visit | Attending: Internal Medicine | Admitting: Internal Medicine

## 2012-03-09 ENCOUNTER — Inpatient Hospital Stay (HOSPITAL_COMMUNITY): Payer: Medicare Other

## 2012-03-09 ENCOUNTER — Other Ambulatory Visit: Payer: Self-pay

## 2012-03-09 DIAGNOSIS — J189 Pneumonia, unspecified organism: Secondary | ICD-10-CM | POA: Diagnosis not present

## 2012-03-09 DIAGNOSIS — A088 Other specified intestinal infections: Secondary | ICD-10-CM | POA: Diagnosis present

## 2012-03-09 DIAGNOSIS — N182 Chronic kidney disease, stage 2 (mild): Secondary | ICD-10-CM

## 2012-03-09 DIAGNOSIS — R112 Nausea with vomiting, unspecified: Secondary | ICD-10-CM | POA: Diagnosis present

## 2012-03-09 DIAGNOSIS — F200 Paranoid schizophrenia: Secondary | ICD-10-CM

## 2012-03-09 DIAGNOSIS — D696 Thrombocytopenia, unspecified: Secondary | ICD-10-CM | POA: Diagnosis present

## 2012-03-09 DIAGNOSIS — F1010 Alcohol abuse, uncomplicated: Secondary | ICD-10-CM | POA: Diagnosis present

## 2012-03-09 DIAGNOSIS — F1410 Cocaine abuse, uncomplicated: Secondary | ICD-10-CM | POA: Diagnosis present

## 2012-03-09 DIAGNOSIS — F17200 Nicotine dependence, unspecified, uncomplicated: Secondary | ICD-10-CM | POA: Diagnosis present

## 2012-03-09 DIAGNOSIS — R0602 Shortness of breath: Secondary | ICD-10-CM | POA: Diagnosis present

## 2012-03-09 DIAGNOSIS — K219 Gastro-esophageal reflux disease without esophagitis: Secondary | ICD-10-CM

## 2012-03-09 DIAGNOSIS — R0902 Hypoxemia: Secondary | ICD-10-CM

## 2012-03-09 DIAGNOSIS — Z79899 Other long term (current) drug therapy: Secondary | ICD-10-CM

## 2012-03-09 DIAGNOSIS — D72829 Elevated white blood cell count, unspecified: Secondary | ICD-10-CM

## 2012-03-09 DIAGNOSIS — R509 Fever, unspecified: Secondary | ICD-10-CM

## 2012-03-09 DIAGNOSIS — I129 Hypertensive chronic kidney disease with stage 1 through stage 4 chronic kidney disease, or unspecified chronic kidney disease: Secondary | ICD-10-CM | POA: Diagnosis present

## 2012-03-09 DIAGNOSIS — D649 Anemia, unspecified: Secondary | ICD-10-CM | POA: Diagnosis present

## 2012-03-09 DIAGNOSIS — G4700 Insomnia, unspecified: Secondary | ICD-10-CM

## 2012-03-09 DIAGNOSIS — F1210 Cannabis abuse, uncomplicated: Secondary | ICD-10-CM | POA: Diagnosis present

## 2012-03-09 DIAGNOSIS — I10 Essential (primary) hypertension: Secondary | ICD-10-CM

## 2012-03-09 LAB — COMPREHENSIVE METABOLIC PANEL
ALT: 30 U/L (ref 0–53)
Alkaline Phosphatase: 90 U/L (ref 39–117)
CO2: 27 mEq/L (ref 19–32)
GFR calc Af Amer: 65 mL/min — ABNORMAL LOW (ref >90)
Glucose, Bld: 131 mg/dL — ABNORMAL HIGH (ref 70–99)
Potassium: 4.1 mEq/L (ref 3.5–5.1)
Sodium: 132 mEq/L — ABNORMAL LOW (ref 135–145)
Total Protein: 6 g/dL (ref 6.0–8.3)

## 2012-03-09 LAB — DIFFERENTIAL
Lymphocytes Relative: 5 % — ABNORMAL LOW (ref 12–46)
Lymphs Abs: 1.1 10*3/uL (ref 0.7–4.0)
Neutrophils Relative %: 87 % — ABNORMAL HIGH (ref 43–77)

## 2012-03-09 LAB — CARDIAC PANEL(CRET KIN+CKTOT+MB+TROPI)
CK, MB: 1.1 ng/mL (ref 0.3–4.0)
Relative Index: INVALID (ref 0.0–2.5)
Troponin I: <0.3 ng/mL (ref <0.30)

## 2012-03-09 LAB — URINALYSIS, ROUTINE W REFLEX MICROSCOPIC
Bilirubin Urine: NEGATIVE
Glucose, UA: NEGATIVE mg/dL
Hgb urine dipstick: NEGATIVE
Specific Gravity, Urine: 1.016 (ref 1.005–1.030)
Urobilinogen, UA: 0.2 mg/dL (ref 0.0–1.0)

## 2012-03-09 LAB — CBC
Platelets: 133 10*3/uL — ABNORMAL LOW (ref 150–400)
RBC: 3.93 MIL/uL — ABNORMAL LOW (ref 4.22–5.81)
WBC: 22.9 10*3/uL — ABNORMAL HIGH (ref 4.0–10.5)

## 2012-03-09 MED ORDER — SODIUM CHLORIDE 0.9 % IV BOLUS (SEPSIS)
1000.0000 mL | Freq: Once | INTRAVENOUS | Status: AC
  Administered 2012-03-09: 1000 mL via INTRAVENOUS

## 2012-03-09 MED ORDER — BENZTROPINE MESYLATE 1 MG PO TABS
1.0000 mg | ORAL_TABLET | Freq: Three times a day (TID) | ORAL | Status: DC
  Administered 2012-03-10: 1 mg via ORAL
  Filled 2012-03-09 (×15): qty 1

## 2012-03-09 MED ORDER — ONDANSETRON HCL 4 MG PO TABS: 4.0000 mg | ORAL_TABLET | Freq: Four times a day (QID) | ORAL | Status: DC | PRN

## 2012-03-09 MED ORDER — GUAIFENESIN-DM 100-10 MG/5ML PO SYRP
5.0000 mL | ORAL_SOLUTION | ORAL | Status: DC | PRN
  Administered 2012-03-10 – 2012-03-11 (×3): 5 mL via ORAL
  Filled 2012-03-09 (×3): qty 10

## 2012-03-09 MED ORDER — FOLIC ACID 1 MG PO TABS
1.0000 mg | ORAL_TABLET | Freq: Every day | ORAL | Status: DC
  Administered 2012-03-09 – 2012-03-12 (×4): 1 mg via ORAL
  Filled 2012-03-09 (×5): qty 1

## 2012-03-09 MED ORDER — VITAMIN B-1 100 MG PO TABS
100.0000 mg | ORAL_TABLET | Freq: Every day | ORAL | Status: DC
  Administered 2012-03-09 – 2012-03-12 (×4): 100 mg via ORAL
  Filled 2012-03-09 (×5): qty 1

## 2012-03-09 MED ORDER — NICOTINE 21 MG/24HR TD PT24
21.0000 mg | MEDICATED_PATCH | Freq: Every day | TRANSDERMAL | Status: DC
  Administered 2012-03-10 – 2012-03-12 (×3): 21 mg via TRANSDERMAL
  Filled 2012-03-09 (×5): qty 1

## 2012-03-09 MED ORDER — SODIUM CHLORIDE 0.9 % IV SOLN: INTRAVENOUS | Status: DC

## 2012-03-09 MED ORDER — PIPERACILLIN-TAZOBACTAM 3.375 G IVPB
3.3750 g | INTRAVENOUS | Status: AC
Start: 2012-03-09 — End: 2012-03-09
  Administered 2012-03-09: 3.375 g via INTRAVENOUS
  Filled 2012-03-09: qty 50

## 2012-03-09 MED ORDER — TRAZODONE HCL 150 MG PO TABS
150.0000 mg | ORAL_TABLET | Freq: Every day | ORAL | Status: DC
  Administered 2012-03-10 – 2012-03-11 (×2): 150 mg via ORAL
  Filled 2012-03-09 (×5): qty 1

## 2012-03-09 MED ORDER — ACETAMINOPHEN 650 MG RE SUPP: 650.0000 mg | Freq: Four times a day (QID) | RECTAL | Status: DC | PRN

## 2012-03-09 MED ORDER — MORPHINE SULFATE 2 MG/ML IJ SOLN
1.0000 mg | Freq: Four times a day (QID) | INTRAMUSCULAR | Status: DC | PRN
  Filled 2012-03-09: qty 1

## 2012-03-09 MED ORDER — ZOLPIDEM TARTRATE 5 MG PO TABS: 5.0000 mg | ORAL_TABLET | Freq: Every evening | ORAL | Status: DC | PRN

## 2012-03-09 MED ORDER — CARBAMAZEPINE 200 MG PO TABS
200.0000 mg | ORAL_TABLET | Freq: Two times a day (BID) | ORAL | Status: DC
  Administered 2012-03-09 – 2012-03-11 (×5): 200 mg via ORAL
  Filled 2012-03-09 (×9): qty 1

## 2012-03-09 MED ORDER — HALOPERIDOL 5 MG PO TABS
10.0000 mg | ORAL_TABLET | Freq: Two times a day (BID) | ORAL | Status: DC
  Administered 2012-03-09 – 2012-03-12 (×6): 10 mg via ORAL
  Filled 2012-03-09 (×9): qty 2

## 2012-03-09 MED ORDER — VANCOMYCIN HCL IN DEXTROSE 1-5 GM/200ML-% IV SOLN
1000.0000 mg | INTRAVENOUS | Status: AC
  Administered 2012-03-09: 1000 mg via INTRAVENOUS
  Filled 2012-03-09: qty 200

## 2012-03-09 MED ORDER — AMLODIPINE BESYLATE 5 MG PO TABS
5.0000 mg | ORAL_TABLET | Freq: Every day | ORAL | Status: DC
  Administered 2012-03-09 – 2012-03-12 (×4): 5 mg via ORAL
  Filled 2012-03-09 (×5): qty 1

## 2012-03-09 MED ORDER — CHLORDIAZEPOXIDE HCL 25 MG PO CAPS
25.0000 mg | ORAL_CAPSULE | Freq: Three times a day (TID) | ORAL | Status: DC
  Administered 2012-03-09 – 2012-03-12 (×10): 25 mg via ORAL
  Filled 2012-03-09 (×10): qty 1

## 2012-03-09 MED ORDER — ONDANSETRON HCL 4 MG/2ML IJ SOLN: 4.0000 mg | Freq: Four times a day (QID) | INTRAMUSCULAR | Status: DC | PRN

## 2012-03-09 MED ORDER — ALBUTEROL SULFATE (5 MG/ML) 0.5% IN NEBU
2.5000 mg | INHALATION_SOLUTION | Freq: Four times a day (QID) | RESPIRATORY_TRACT | Status: DC
  Administered 2012-03-09: 2.5 mg via RESPIRATORY_TRACT
  Filled 2012-03-09: qty 0.5

## 2012-03-09 MED ORDER — VANCOMYCIN HCL IN DEXTROSE 1-5 GM/200ML-% IV SOLN
1000.0000 mg | Freq: Two times a day (BID) | INTRAVENOUS | Status: DC
  Administered 2012-03-09 – 2012-03-11 (×4): 1000 mg via INTRAVENOUS
  Filled 2012-03-09 (×7): qty 200

## 2012-03-09 MED ORDER — PANTOPRAZOLE SODIUM 40 MG PO TBEC
40.0000 mg | DELAYED_RELEASE_TABLET | Freq: Every day | ORAL | Status: DC
  Administered 2012-03-09 – 2012-03-12 (×4): 40 mg via ORAL
  Filled 2012-03-09 (×5): qty 1

## 2012-03-09 MED ORDER — ACETAMINOPHEN 325 MG PO TABS
650.0000 mg | ORAL_TABLET | Freq: Four times a day (QID) | ORAL | Status: DC | PRN
  Administered 2012-03-10: 650 mg via ORAL
  Filled 2012-03-09: qty 2

## 2012-03-09 MED ORDER — ENOXAPARIN SODIUM 40 MG/0.4ML ~~LOC~~ SOLN
40.0000 mg | SUBCUTANEOUS | Status: DC
  Filled 2012-03-09: qty 0.4

## 2012-03-09 MED ORDER — ONDANSETRON HCL 4 MG/2ML IJ SOLN: 4.0000 mg | Freq: Three times a day (TID) | INTRAMUSCULAR | Status: DC | PRN

## 2012-03-09 MED ORDER — SODIUM CHLORIDE 0.9 % IV SOLN
INTRAVENOUS | Status: AC
  Administered 2012-03-09: 17:00:00 via INTRAVENOUS

## 2012-03-09 MED ORDER — PIPERACILLIN-TAZOBACTAM 3.375 G IVPB
3.3750 g | Freq: Three times a day (TID) | INTRAVENOUS | Status: DC
  Administered 2012-03-09 – 2012-03-11 (×6): 3.375 g via INTRAVENOUS
  Filled 2012-03-09 (×10): qty 50

## 2012-03-09 MED ORDER — LEVOFLOXACIN IN D5W 750 MG/150ML IV SOLN
750.0000 mg | INTRAVENOUS | Status: DC
  Administered 2012-03-09: 750 mg via INTRAVENOUS
  Filled 2012-03-09: qty 150

## 2012-03-09 NOTE — ED Notes (Signed)
BJY:[id number]<FA> Expected date:<BR> Expected time:<BR> Means of arrival:<BR> Comments:<BR> EMS - -pneumonia

## 2012-03-09 NOTE — Progress Notes (Signed)
Patient ID: David [name], male   DOB: 04/27/1956, 56 y.o.   MRN: [id number] Came out to med window this evening, refused cogentin, but took the rest of his meds without issue.  Sat in the dayroom, attended group, watched tv, seems internally focused, limited interaction with staff and peers.  Still coughing, c/o sore throat.  Later began to c/o nausea, was given ginger ale.  Went to bed and was asleep for a short time, Later vomited, and vomited a second time.  Breathing was sounding coarse, saline neb was given, 02 sats were 90% on RA, 99.1-145/88-110-32.  NP on call notified, getting ready to send back to ED for further eval.

## 2012-03-09 NOTE — Progress Notes (Signed)
ANTIBIOTIC CONSULT NOTE - INITIAL  Pharmacy Consult for Vancomycin/Zosyn Indication: pneumonia  Allergies  Allergen Reactions  . Lithium Other (See Comments)    "get's very sick"    Patient Measurements:   Body Weight: 81 kg (03/04/12)  Vital Signs: Temp: 99 F (37.2 C) (03/10 1200) Temp src: Oral (03/10 1200) BP: 133/69 mmHg (03/10 1200) Pulse Rate: 102  (03/10 1200)  Labs:  Basename 03/09/12 0833  WBC 22.9*  HGB 10.8*  PLT 133*  LABCREA --  CREATININE 1.39*   The CrCl is unknown because both a height and weight (above a minimum accepted value) are required for this calculation. No results found for this basename: VANCOTROUGH:2,VANCOPEAK:2,VANCORANDOM:2,GENTTROUGH:2,GENTPEAK:2,GENTRANDOM:2,TOBRATROUGH:2,TOBRAPEAK:2,TOBRARND:2,AMIKACINPEAK:2,AMIKACINTROU:2,AMIKACIN:2, in the last 72 hours   Microbiology: Recent Results (from the past 720 hour(s))  CULTURE, BLOOD (ROUTINE X 2)     Status: Normal (Preliminary result)   Collection Time   03/04/12 11:00 AM      Component Value Range Status Comment   Specimen Description BLOOD RIGHT HAND   Final    Special Requests BOTTLES DRAWN AEROBIC AND ANAEROBIC 5CC.   Final    Culture  Setup Time [id number]   Final    Culture     Final    Value:        BLOOD CULTURE RECEIVED NO GROWTH TO DATE CULTURE WILL BE HELD FOR 5 DAYS BEFORE ISSUING A FINAL NEGATIVE REPORT   Report Status PENDING   Incomplete   CULTURE, BLOOD (ROUTINE X 2)     Status: Normal (Preliminary result)   Collection Time   03/04/12 11:00 AM      Component Value Range Status Comment   Specimen Description BLOOD LEFT HAND   Final    Special Requests BOTTLES DRAWN AEROBIC AND ANAEROBIC 5CC.   Final    Culture  Setup Time [id number]   Final    Culture     Final    Value:        BLOOD CULTURE RECEIVED NO GROWTH TO DATE CULTURE WILL BE HELD FOR 5 DAYS BEFORE ISSUING A FINAL NEGATIVE REPORT   Report Status PENDING   Incomplete     Medical History: Past Medical History   Diagnosis Date  . Schizophrenia   . Hypertension   . Pneumonia     Assessment: 55YOM to begin empiric vancomycin and zosyn for suspected HCAP (recently admitted for stabilization of acute exacerbation of schizophrenia).  SCr 1.39, CrCl(CG)~69 mL/min.  Goal of Therapy:  Vancomycin trough level 15-20 mcg/ml  Plan:  Zosyn 3.375g IV q8h (4 hour infusion time). Vancomycin 1g IV q12h.  Clance Boll 03/09/2012,3:36 PM

## 2012-03-09 NOTE — H&P (Signed)
PCP:   Sheila Oats, MD, MD   Chief Complaint:  Nausea/vomiting; cough; increased work of breathing and low grade fever  HPI: 56 -year-old male with a past medical history significant for hypertension, gastroesophageal reflux disease, hx of alcohol abuse and paranoid schizophrenia; who was recently admitted  2/2 HCAP and discharge after 2 days of IV antibiotics with instruction to finish regimen by mouth using levaquin. Patient return from [facility] due to increased work of breathing; high temp, associated cough and also nausea and vomiting. In the ED patient was also found with elevated wbc's, RR 32; mild hypoxia and a CXR demonstrating improved PNA in comparison with previous x-ray. At this point [facility] called to admit patient for further evaluation and treatment.    Allergies:   Allergies  Allergen Reactions  . Lithium Other (See Comments)    "get's very sick"      Past Medical History  Diagnosis Date  . Schizophrenia   . Hypertension   . Pneumonia     Past Surgical History  Procedure Date  . Tonsillectomy     Prior to Admission medications   Medication Sig Start Date End Date Taking? Authorizing Provider  amLODipine (NORVASC) 5 MG tablet Take 1 tablet (5 mg total) by mouth daily. 03/06/12 03/06/13 Yes Standley Brooking, MD  benztropine (COGENTIN) 1 MG tablet Take 1 tablet (1 mg total) by mouth 3 (three) times daily. 03/06/12 03/06/13 Yes Standley Brooking, MD  carbamazepine (TEGRETOL) 200 MG tablet Take 1 tablet (200 mg total) by mouth 2 (two) times daily. 03/06/12 03/06/13 Yes Standley Brooking, MD  chlordiazePOXIDE (LIBRIUM) 25 MG capsule Take 1 capsule (25 mg total) by mouth 3 (three) times daily. 03/06/12 04/05/12 Yes Standley Brooking, MD  folic acid (FOLVITE) 1 MG tablet Take 1 tablet (1 mg total) by mouth daily. 03/06/12 03/06/13 Yes Standley Brooking, MD  haloperidol (HALDOL) 10 MG tablet Take 1 tablet (10 mg total) by mouth 2 (two) times daily at 8am and 2pm. 03/06/12 04/05/12 Yes Standley Brooking, MD  haloperidol (HALDOL) 20 MG tablet Take 1 tablet (20 mg total) by mouth at bedtime. 03/06/12 04/05/12 Yes Standley Brooking, MD  levofloxacin (LEVAQUIN) 750 MG tablet Take 1 tablet (750 mg total) by mouth daily. Last dose March 12. 03/06/12 03/16/12 Yes Standley Brooking, MD  nicotine (NICODERM CQ - DOSED IN MG/24 HOURS) 21 mg/24hr patch Place 1 patch onto the skin daily at 6 (six) AM. 03/06/12 04/05/12 Yes Standley Brooking, MD  pantoprazole (PROTONIX) 40 MG tablet Take 40 mg by mouth daily.   Yes Historical Provider, MD  thiamine 100 MG tablet Take 1 tablet (100 mg total) by mouth daily. 03/06/12 03/06/13 Yes Standley Brooking, MD  traZODone (DESYREL) 150 MG tablet Take 1 tablet (150 mg total) by mouth at bedtime. 03/06/12 04/05/12 Yes Standley Brooking, MD  zolpidem (AMBIEN) 5 MG tablet Take 1 tablet (5 mg total) by mouth at bedtime as needed for sleep (insomnia). 03/06/12 03/06/13 Yes Standley Brooking, MD    Social History:  reports that he has been smoking Cigarettes.  He has a 60 pack-year smoking history. He has never used smokeless tobacco. He reports that he drinks alcohol. He reports that he uses illicit drugs (Cocaine and Marijuana).  History reviewed. No pertinent family history.  Review of Systems:  Negative except as mentioned on HPI.   Physical Exam: Blood pressure 133/69, pulse 102, temperature 99 F (37.2 C), temperature source Oral, resp. rate  16, SpO2 98.00%. Constitutional: Somnolent but easily aroused; ill-appearing and warm to touch..  Head: Normocephalic and atraumatic.  Eyes: Conjunctivae are normal. Pupils are equal, round, and reactive to light. No icterus  Neck: No thyromegaly, no bruits, supple.  Cardiovascular: mild tachycardia, no murmurs, S1 and S2  Pulmonary/Chest: diffuse rhonchi, no crackles; decreased breath sounds at bases bilaterally (R > L).  Abdominal: Soft. Bowel sounds are normal. He exhibits no distension. There is no tenderness.  Musculoskeletal: Normal  range of motion. He exhibits no edema and no tenderness.  Neurological: No cranial nerve deficit. follow simple commands and moves all extremities  Skin: Skin is warm and dry. No rash noted.    Labs on Admission:  Results for orders placed during the hospital encounter of 03/06/12 (from the past 48 hour(s))  LACTIC ACID, PLASMA     Status: Normal   Collection Time   03/09/12  8:15 AM      Component Value Range Comment   Lactic Acid, Venous 0.8  0.5 - 2.2 (mmol/L)   CARDIAC PANEL(CRET KIN+CKTOT+MB+TROPI)     Status: Normal   Collection Time   03/09/12  8:15 AM      Component Value Range Comment   Total CK 38  7 - 232 (U/L)    CK, MB 1.1  0.3 - 4.0 (ng/mL)    Troponin I <0.30  <0.30 (ng/mL)    Relative Index RELATIVE INDEX IS INVALID  0.0 - 2.5    CBC     Status: Abnormal   Collection Time   03/09/12  8:33 AM      Component Value Range Comment   WBC 22.9 (*) 4.0 - 10.5 (K/uL)    RBC 3.93 (*) 4.22 - 5.81 (MIL/uL)    Hemoglobin 10.8 (*) 13.0 - 17.0 (g/dL)    HCT 56.2 (*) 13.0 - 52.0 (%)    MCV 83.0  78.0 - 100.0 (fL)    MCH 27.5  26.0 - 34.0 (pg)    MCHC 33.1  30.0 - 36.0 (g/dL)    RDW 86.5 (*) 78.4 - 15.5 (%)    Platelets 133 (*) 150 - 400 (K/uL)   DIFFERENTIAL     Status: Abnormal   Collection Time   03/09/12  8:33 AM      Component Value Range Comment   Neutrophils Relative 87 (*) 43 - 77 (%)    Neutro Abs 20.0 (*) 1.7 - 7.7 (K/uL)    Lymphocytes Relative 5 (*) 12 - 46 (%)    Lymphs Abs 1.1  0.7 - 4.0 (K/uL)    Monocytes Relative 6  3 - 12 (%)    Monocytes Absolute 1.3 (*) 0.1 - 1.0 (K/uL)    Eosinophils Relative 2  0 - 5 (%)    Eosinophils Absolute 0.5  0.0 - 0.7 (K/uL)    Basophils Relative 0  0 - 1 (%)    Basophils Absolute 0.0  0.0 - 0.1 (K/uL)   COMPREHENSIVE METABOLIC PANEL     Status: Abnormal   Collection Time   03/09/12  8:33 AM      Component Value Range Comment   Sodium 132 (*) 135 - 145 (mEq/L)    Potassium 4.1  3.5 - 5.1 (mEq/L)    Chloride 98  96 - 112  (mEq/L)    CO2 27  19 - 32 (mEq/L)    Glucose, Bld 131 (*) 70 - 99 (mg/dL)    BUN 21  6 - 23 (mg/dL)  Creatinine, Ser 1.39 (*) 0.50 - 1.35 (mg/dL)    Calcium 9.0  8.4 - 10.5 (mg/dL)    Total Protein 6.0  6.0 - 8.3 (g/dL)    Albumin 2.6 (*) 3.5 - 5.2 (g/dL)    AST 22  0 - 37 (U/L)    ALT 30  0 - 53 (U/L)    Alkaline Phosphatase 90  39 - 117 (U/L)    Total Bilirubin 0.1 (*) 0.3 - 1.2 (mg/dL)    GFR calc non Af Amer 56 (*) >90 (mL/min)    GFR calc Af Amer 65 (*) >90 (mL/min)   URINALYSIS, ROUTINE W REFLEX MICROSCOPIC     Status: Abnormal   Collection Time   03/09/12 10:56 AM      Component Value Range Comment   Color, Urine YELLOW  YELLOW     APPearance CLOUDY (*) CLEAR     Specific Gravity, Urine 1.016  1.005 - 1.030     pH 7.5  5.0 - 8.0     Glucose, UA NEGATIVE  NEGATIVE (mg/dL)    Hgb urine dipstick NEGATIVE  NEGATIVE     Bilirubin Urine NEGATIVE  NEGATIVE     Ketones, ur NEGATIVE  NEGATIVE (mg/dL)    Protein, ur NEGATIVE  NEGATIVE (mg/dL)    Urobilinogen, UA 0.2  0.0 - 1.0 (mg/dL)    Nitrite NEGATIVE  NEGATIVE     Leukocytes, UA NEGATIVE  NEGATIVE  MICROSCOPIC NOT DONE ON URINES WITH NEGATIVE PROTEIN, BLOOD, LEUKOCYTES, NITRITE, OR GLUCOSE <1000 mg/dL.    Radiological Exams on Admission: No results found.  Assessment/Plan 1-HCAP (healthcare-associated pneumonia): Will admit the patient for IV antibiotics using  vancomycin and Zosyn. Will provide supplemental oxygen and incentive spirometry. Concerns for aspiration at this point due to N/V and history of lethargy.  2-Schizophrenia, paranoid, chronic: Will continue haldol and tegretol; further rec's per Psych; they will follow patient while in the hospital.   3-HTN (hypertension): Continue amlodipine.    4-GERD (gastroesophageal reflux disease): continue PPI.   5-Mild hypoxia, Fever and Leukocytosis: 2/2 #1; continue treatment as above; provide supportive care; supplemental oxygen and PRN antypyretics.   6-Insomnia:  Continue PRN zolpidem.   7-CKD (chronic kidney disease) stage 2, GFR 60-89 ml/min: stable and currently at baseline (lives with Cr level in the 1.6-1.8 range). Will follow kidney function and adjust antibiotics per pharmacy.   8-Nausea/vomiting: could be associated with antibiotics vs viral gastroenteritis (especially norovirus). Will provide rehydration; use PRN antiemetics and follow symptoms.  9-DVT: Lovenox.  Time Spent on Admission: 50 minutes.  Keyshun Elpers [facility] [phone]  03/09/2012, 3:35 PM

## 2012-03-09 NOTE — Progress Notes (Signed)
Pt mother David [name] called concerned about pt readmission to hospital.  Requesting information.  Patient states I am ok with my mom knowing medical information.  Mom says she is POA.  No doctumentation found. Permission given as stated by pt to release information.  Discussed transfer with mother and diagnosis.  Answered questions, will follow up with case management regarding POA

## 2012-03-09 NOTE — ED Provider Notes (Signed)
History     CSN: [id number]  Arrival date & time 03/09/12  0543   First MD Initiated Contact with Patient 03/09/12 [phone]      Chief Complaint  Patient presents with  . Shortness of Breath  . Fatigue    (Consider location/radiation/quality/duration/timing/severity/associated sxs/prior treatment) HPI Comments: Patient is transferred from behavioral [facility] hospital with decreased mental status, shortness of breath and nausea and vomiting. He was admitted to the hospital from March 5 to March 7 for pneumonia and is on Levaquin. Caregiver states overnight patient hadn't nausea and vomiting decreased mental status and increased work or breathing. Patient is somnolent arousable to voice and protecting airway. He is tachypneic and tachycardic.  The history is provided by the patient and a caregiver. The history is limited by the condition of the patient.    Past Medical History  Diagnosis Date  . Schizophrenia   . Hypertension   . Pneumonia     Past Surgical History  Procedure Date  . Tonsillectomy     History reviewed. No pertinent family history.  History  Substance Use Topics  . Smoking status: Current Everyday Smoker -- 1.5 packs/day for 40 years    Types: Cigarettes  . Smokeless tobacco: Never Used  . Alcohol Use: Yes      Review of Systems  Unable to perform ROS: Mental status change  Respiratory: Positive for shortness of breath.     Allergies  Lithium  Home Medications   No current outpatient prescriptions on file.  BP 145/67  Pulse 113  Temp(Src) 100.2 F (37.9 C) (Oral)  Resp 34  Ht 6' (1.829 m)  Wt 178 lb (80.74 kg)  BMI 24.14 kg/m2  SpO2 96%  Physical Exam  Constitutional: He is oriented to person, place, and time. He appears well-developed.       Ill appearing, somnolent but arousable to voice  HENT:  Head: Normocephalic and atraumatic.  Mouth/Throat: Oropharynx is clear and moist.  Eyes: Conjunctivae are normal. Pupils are equal, round,  and reactive to light.  Neck: Normal range of motion.  Cardiovascular: Normal rate, regular rhythm and normal heart sounds.   Pulmonary/Chest: Breath sounds normal. He is in respiratory distress.       Bibasilar rales  Abdominal: Soft. There is no tenderness. There is no rebound and no guarding.  Musculoskeletal: Normal range of motion. He exhibits no edema and no tenderness.  Neurological: He is alert and oriented to person, place, and time.       Slurred speech, somnolent, follows basic commands  Skin: Skin is warm.    ED Course  Procedures (including critical care time)  Labs Reviewed  CBC - Abnormal; Notable for the following:    WBC 22.9 (*)    RBC 3.93 (*)    Hemoglobin 10.8 (*)    HCT 32.6 (*)    RDW 15.9 (*)    Platelets 133 (*)    All other components within normal limits  DIFFERENTIAL - Abnormal; Notable for the following:    Neutrophils Relative 87 (*)    Neutro Abs 20.0 (*)    Lymphocytes Relative 5 (*)    Monocytes Absolute 1.3 (*)    All other components within normal limits  COMPREHENSIVE METABOLIC PANEL - Abnormal; Notable for the following:    Sodium 132 (*)    Glucose, Bld 131 (*)    Creatinine, Ser 1.39 (*)    Albumin 2.6 (*)    Total Bilirubin 0.1 (*)    GFR  calc non Af Amer 56 (*)    GFR calc Af Amer 65 (*)    All other components within normal limits  URINALYSIS, ROUTINE W REFLEX MICROSCOPIC - Abnormal; Notable for the following:    APPearance CLOUDY (*)    All other components within normal limits  LACTIC ACID, PLASMA  CARDIAC PANEL(CRET KIN+CKTOT+MB+TROPI)  CULTURE, BLOOD (ROUTINE X 2)  CULTURE, BLOOD (ROUTINE X 2)   Dg Chest Portable 1 View  03/09/2012  *RADIOLOGY REPORT*  Clinical Data: Shortness of breath, altered mental status.  PORTABLE CHEST - 1 VIEW  Comparison: Chest x-ray 03/04/2012.  Findings: Lung volumes remain slightly low.  There are irregular interstitial and patchy airspace opacities throughout the right mid and lower lung,  slightly improved compared to recent prior examination, with exception of increasing blunting of the right costophrenic sulcus, indicative of a slight increase in right-sided pleural fluid.  Left lung appears relatively clear. Trace left pleural effusion unchanged.  Pulmonary vasculature is normal. Heart size is normal. The patient is rotated to the right on today's exam, resulting in distortion of the mediastinal contours and reduced diagnostic sensitivity and specificity for mediastinal pathology.  IMPRESSION: 1.  Overall, compared to recent prior study, there is slight improved aeration in the right mid and lower lung, compatible with some clearing pneumonia.  Small right-sided pleural effusion has slightly increased.  Trace left pleural effusion is unchanged.  Original Report Authenticated By: Florencia Reasons, M.D.     1. Schizophrenia, paranoid, chronic   2. HCAP (healthcare-associated pneumonia)       MDM  Schizophrenia patient with recent diagnosis of health care associated pneumonia. Decreased mental status with increased work of breathing today.  Will repeat chest x-ray, ABG, labs  Improved air space disease on CXR.  However increased RR and leukocytosis. Mental status improving with IVF and IV antibiotics.  Given decreased mental status and increased respiratory rate, will need medical admission.     Glynn Octave, MD 03/09/12 [phone]

## 2012-03-10 DIAGNOSIS — J159 Unspecified bacterial pneumonia: Secondary | ICD-10-CM | POA: Diagnosis not present

## 2012-03-10 DIAGNOSIS — D696 Thrombocytopenia, unspecified: Secondary | ICD-10-CM | POA: Diagnosis not present

## 2012-03-10 DIAGNOSIS — D7289 Other specified disorders of white blood cells: Secondary | ICD-10-CM | POA: Diagnosis not present

## 2012-03-10 DIAGNOSIS — R112 Nausea with vomiting, unspecified: Secondary | ICD-10-CM | POA: Diagnosis not present

## 2012-03-10 LAB — CULTURE, BLOOD (ROUTINE X 2)
Culture  Setup Time: 201303051449
Culture: NO GROWTH

## 2012-03-10 LAB — BASIC METABOLIC PANEL
CO2: 30 mEq/L (ref 19–32)
Calcium: 8.4 mg/dL (ref 8.4–10.5)
Chloride: 102 mEq/L (ref 96–112)
Creatinine, Ser: 1.52 mg/dL — ABNORMAL HIGH (ref 0.50–1.35)
GFR calc Af Amer: 58 mL/min — ABNORMAL LOW (ref >90)
Sodium: 136 mEq/L (ref 135–145)

## 2012-03-10 LAB — CBC
MCV: 83.3 fL (ref 78.0–100.0)
Platelets: 115 10*3/uL — ABNORMAL LOW (ref 150–400)
RBC: 3.41 MIL/uL — ABNORMAL LOW (ref 4.22–5.81)
RDW: 16.2 % — ABNORMAL HIGH (ref 11.5–15.5)
WBC: 20.7 10*3/uL — ABNORMAL HIGH (ref 4.0–10.5)

## 2012-03-10 MED ORDER — IPRATROPIUM BROMIDE 0.02 % IN SOLN
0.5000 mg | Freq: Four times a day (QID) | RESPIRATORY_TRACT | Status: DC
  Administered 2012-03-10 – 2012-03-11 (×6): 0.5 mg via RESPIRATORY_TRACT
  Filled 2012-03-10 (×5): qty 2.5

## 2012-03-10 MED ORDER — IPRATROPIUM BROMIDE 0.02 % IN SOLN
0.5000 mg | RESPIRATORY_TRACT | Status: DC | PRN
  Administered 2012-03-10: 0.5 mg via RESPIRATORY_TRACT
  Filled 2012-03-10 (×3): qty 2.5

## 2012-03-10 MED ORDER — ALBUTEROL SULFATE (5 MG/ML) 0.5% IN NEBU: 2.5000 mg | INHALATION_SOLUTION | Freq: Four times a day (QID) | RESPIRATORY_TRACT | Status: DC

## 2012-03-10 MED ORDER — ALBUTEROL SULFATE (5 MG/ML) 0.5% IN NEBU
2.5000 mg | INHALATION_SOLUTION | RESPIRATORY_TRACT | Status: DC | PRN
  Administered 2012-03-10: 2.5 mg via RESPIRATORY_TRACT
  Filled 2012-03-10 (×3): qty 0.5

## 2012-03-10 MED ORDER — IPRATROPIUM BROMIDE 0.02 % IN SOLN: 0.5000 mg | Freq: Four times a day (QID) | RESPIRATORY_TRACT | Status: DC

## 2012-03-10 MED ORDER — ALBUTEROL SULFATE (5 MG/ML) 0.5% IN NEBU
2.5000 mg | INHALATION_SOLUTION | Freq: Four times a day (QID) | RESPIRATORY_TRACT | Status: DC
  Administered 2012-03-10 – 2012-03-11 (×6): 2.5 mg via RESPIRATORY_TRACT
  Filled 2012-03-10 (×5): qty 0.5

## 2012-03-10 NOTE — Progress Notes (Signed)
UR complete 

## 2012-03-10 NOTE — Progress Notes (Signed)
Subjective:   Chart reviewed. Patient complains of intermittent hacking cough with mild white sputum. He denies dyspnea. According to nursing no acute issues. Oxygen saturations at rest are 99-100% on room air.  Objective  Vital signs in last 24 hours: Filed Vitals:   03/10/12 0536 03/10/12 0926 03/10/12 1400 03/10/12 1411  BP:   109/53   Pulse:   97   Temp:   98.2 F (36.8 C)   TempSrc:   Oral   Resp:   18   Height:      Weight:      SpO2: 94% 95% 99% 96%   Weight change:   Intake/Output Summary (Last 24 hours) at 03/10/12 1642 Last data filed at 03/10/12 1121  Gross per 24 hour  Intake 1998.75 ml  Output   2625 ml  Net -626.25 ml    Physical Exam:  General Exam: Comfortable.  Respiratory System: Few basal crackles left greater than the right but otherwise clear to auscultation. No increased work of breathing.  Cardiovascular System: First and second heart sounds heard. Regular rate and rhythm. No JVD/murmurs.  Gastrointestinal System: Abdomen is non distended, soft and normal bowel sounds heard.  Central Nervous System: Alert and oriented. No focal neurological deficits.  Labs:  Basic Metabolic Panel:  Lab 03/10/12 1610 03/09/12 0833 03/05/12 0655 03/04/12 1100  NA 136 132* 137 --  K 3.8 4.1 3.8 --  CL 102 98 106 --  CO2 30 27 28  --  GLUCOSE 95 131* 103* --  BUN 17 21 18  --  CREATININE 1.52* 1.39* 1.50* --  CALCIUM 8.4 9.0 8.2* --  ALB -- -- -- --  PHOS -- -- -- 2.6   Liver Function Tests:  Lab 03/09/12 0833  AST 22  ALT 30  ALKPHOS 90  BILITOT 0.1*  PROT 6.0  ALBUMIN 2.6*   No results found for this basename: LIPASE:3,AMYLASE:3 in the last 168 hours No results found for this basename: AMMONIA:3 in the last 168 hours CBC:  Lab 03/10/12 0440 03/09/12 0833 03/05/12 0655 03/04/12 1100  WBC 20.7* 22.9* 11.7* --  NEUTROABS -- 20.0* -- 17.3*  HGB 9.1* 10.8* 9.7* --  HCT 28.4* 32.6* 30.1* --  MCV 83.3 83.0 84.6 84.3  PLT 115* 133* 230 --   Cardiac  Enzymes:  Lab 03/09/12 0815  CKTOTAL 38  CKMB 1.1  CKMBINDEX --  TROPONINI <0.30   CBG:  Lab 03/04/12 1057  GLUCAP 105*    Iron Studies: No results found for this basename: IRON,TIBC,TRANSFERRIN,FERRITIN in the last 72 hours Studies/Results: Dg Chest Portable 1 View  03/09/2012  *RADIOLOGY REPORT*  Clinical Data: Shortness of breath, altered mental status.  PORTABLE CHEST - 1 VIEW  Comparison: Chest x-ray 03/04/2012.  Findings: Lung volumes remain slightly low.  There are irregular interstitial and patchy airspace opacities throughout the right mid and lower lung, slightly improved compared to recent prior examination, with exception of increasing blunting of the right costophrenic sulcus, indicative of a slight increase in right-sided pleural fluid.  Left lung appears relatively clear. Trace left pleural effusion unchanged.  Pulmonary vasculature is normal. Heart size is normal. The patient is rotated to the right on today's exam, resulting in distortion of the mediastinal contours and reduced diagnostic sensitivity and specificity for mediastinal pathology.  IMPRESSION: 1.  Overall, compared to recent prior study, there is slight improved aeration in the right mid and lower lung, compatible with some clearing pneumonia.  Small right-sided pleural effusion has slightly increased.  Trace  left pleural effusion is unchanged.  Original Report Authenticated By: Florencia Reasons, M.D.   Medications:    . sodium chloride 75 mL/hr at 03/09/12 1630      . albuterol  2.5 mg Nebulization Q6H  . amLODipine  5 mg Oral Daily  . benztropine  1 mg Oral TID  . carbamazepine  200 mg Oral BID  . chlordiazePOXIDE  25 mg Oral TID  . folic acid  1 mg Oral Daily  . haloperidol  10 mg Oral BID  . ipratropium  0.5 mg Nebulization Q6H  . nicotine  21 mg Transdermal Q0600  . pantoprazole  40 mg Oral Daily  . piperacillin-tazobactam (ZOSYN)  IV  3.375 g Intravenous Q8H  . thiamine  100 mg Oral Daily  .  traZODone  150 mg Oral QHS  . vancomycin  1,000 mg Intravenous Q12H  . DISCONTD: albuterol  2.5 mg Nebulization Q6H  . DISCONTD: albuterol  2.5 mg Nebulization Q6H WA  . DISCONTD: ipratropium  0.5 mg Nebulization Q6H WA    I  have reviewed scheduled and prn medications.     Problem/Plan: Principal Problem:  *HCAP (healthcare-associated pneumonia) Active Problems:  Schizophrenia, paranoid, chronic  HTN (hypertension)  GERD (gastroesophageal reflux disease)  Fever  Leukocytosis  CKD (chronic kidney disease) stage 2, GFR 60-89 ml/min  SOB (shortness of breath)  Nausea & vomiting  1. Healthcare associated pneumonia: Improving clinically. Continue intravenous vancomycin and Zosyn and consider tapering to oral antibiotics tomorrow. No hypoxemia at rest. 2. Anemia and thrombocytopenia: Unclear etiology. Follow CBCs tomorrow. 3. Leukocytosis: Slightly better than yesterday.? Secondary to problem #1. Follow CBCs tomorrow. 4. Chronic kidney disease: Said to be at baseline. 5. Nausea and vomiting:? Viral gastroenteritis. Improved. Said to be tolerating diet. 6. Hypertension: Controlled. 7. Schizophrenia, paranoid chronic: Continue psychiatric medications. 8. Tobacco abuse: Nicotine patch  David [name] 03/10/2012,4:42 PM  LOS: 1 day

## 2012-03-10 NOTE — Progress Notes (Signed)
Paged [facility] concerning critical lab  36.7 and notified pharmacy will hold for now until further instructions Means, Gabriella Guile N 03-10-12 9:11am

## 2012-03-11 DIAGNOSIS — J159 Unspecified bacterial pneumonia: Secondary | ICD-10-CM | POA: Diagnosis not present

## 2012-03-11 DIAGNOSIS — R509 Fever, unspecified: Secondary | ICD-10-CM | POA: Diagnosis not present

## 2012-03-11 DIAGNOSIS — R112 Nausea with vomiting, unspecified: Secondary | ICD-10-CM | POA: Diagnosis not present

## 2012-03-11 DIAGNOSIS — R0902 Hypoxemia: Secondary | ICD-10-CM | POA: Diagnosis not present

## 2012-03-11 LAB — CBC
HCT: 28.6 % — ABNORMAL LOW (ref 39.0–52.0)
Hemoglobin: 9.4 g/dL — ABNORMAL LOW (ref 13.0–17.0)
MCV: 82.7 fL (ref 78.0–100.0)
RBC: 3.46 MIL/uL — ABNORMAL LOW (ref 4.22–5.81)
WBC: 16.5 10*3/uL — ABNORMAL HIGH (ref 4.0–10.5)

## 2012-03-11 LAB — MRSA PCR SCREENING: MRSA by PCR: NEGATIVE

## 2012-03-11 LAB — DIFFERENTIAL
Eosinophils Relative: 7 % — ABNORMAL HIGH (ref 0–5)
Lymphocytes Relative: 8 % — ABNORMAL LOW (ref 12–46)
Lymphs Abs: 1.2 10*3/uL (ref 0.7–4.0)
Monocytes Absolute: 1.6 10*3/uL — ABNORMAL HIGH (ref 0.1–1.0)
Neutro Abs: 12.5 10*3/uL — ABNORMAL HIGH (ref 1.7–7.7)

## 2012-03-11 LAB — CREATININE, SERUM
GFR calc Af Amer: 56 mL/min — ABNORMAL LOW (ref >90)
GFR calc non Af Amer: 49 mL/min — ABNORMAL LOW (ref >90)

## 2012-03-11 MED ORDER — ALBUTEROL SULFATE (5 MG/ML) 0.5% IN NEBU
2.5000 mg | INHALATION_SOLUTION | Freq: Three times a day (TID) | RESPIRATORY_TRACT | Status: DC
  Administered 2012-03-12 (×2): 2.5 mg via RESPIRATORY_TRACT
  Filled 2012-03-11: qty 1
  Filled 2012-03-11: qty 0.5

## 2012-03-11 MED ORDER — IPRATROPIUM BROMIDE 0.02 % IN SOLN
0.5000 mg | Freq: Three times a day (TID) | RESPIRATORY_TRACT | Status: DC
  Administered 2012-03-12 (×2): 0.5 mg via RESPIRATORY_TRACT
  Filled 2012-03-11: qty 5
  Filled 2012-03-11: qty 2.5

## 2012-03-11 MED ORDER — LEVOFLOXACIN 750 MG PO TABS
750.0000 mg | ORAL_TABLET | Freq: Every day | ORAL | Status: DC
  Administered 2012-03-11 – 2012-03-12 (×2): 750 mg via ORAL
  Filled 2012-03-11 (×3): qty 1

## 2012-03-11 NOTE — Progress Notes (Signed)
Pt refused AM lab work stating he will not allow anything to be done or given to him until he see's his doctor. Nurse spoke to pt after he refused from lab tech and pt still refused. He also refused his cogentin stating he no longer takes it anymore.

## 2012-03-11 NOTE — Progress Notes (Signed)
  Pharmacy Note (Brief) Vanco Level  Pt refused Vanco trough this am (supposed to be drawn at 7am). RN hung Vanco dose at 9am, as advised by this pharmacist. Everlean Patterson level was drawn at 10:30 am, either while Vanco was infusing, or shortly thereafter, thus this is not a true Vanco trough.  Plan 1) Continue Vanco 1g IV q12h 2) If Vanco is to continue much longer, will re-order a trough.  Darrol Angel, PharmD Pager: [phone] 03/11/2012 1:11 PM

## 2012-03-11 NOTE — Progress Notes (Addendum)
Subjective:   Coughing is improved. Patient denies dyspnea. Patient wants to take a shower. He is reluctant to have blood tests done.  Objective  Vital signs in last 24 hours: Filed Vitals:   03/11/12 0239 03/11/12 0602 03/11/12 0735 03/11/12 1400  BP:  122/58  127/67  Pulse:  71  94  Temp:  98.6 F (37 C)  98.8 F (37.1 C)  TempSrc:  Oral  Oral  Resp:  20  18  Height:      Weight:      SpO2: 99% 99% 97% 96%   Weight change:   Intake/Output Summary (Last 24 hours) at 03/11/12 1427 Last data filed at 03/11/12 1254  Gross per 24 hour  Intake   1200 ml  Output   2100 ml  Net   -900 ml    Physical Exam:  General Exam: Comfortable.  Respiratory System: Few basal crackles left greater than the right but otherwise clear to auscultation. No increased work of breathing.  Cardiovascular System: First and second heart sounds heard. Regular rate and rhythm. No JVD/murmurs.  Gastrointestinal System: Abdomen is non distended, soft and normal bowel sounds heard.  Central Nervous System: Alert and oriented. No focal neurological deficits.  Labs:  Basic Metabolic Panel:  Lab 03/11/12 1324 03/10/12 0440 03/09/12 0833 03/05/12 0655  NA -- 136 132* 137  K -- 3.8 4.1 3.8  CL -- 102 98 106  CO2 -- 30 27 28   GLUCOSE -- 95 131* 103*  BUN -- 17 21 18   CREATININE 1.56* 1.52* 1.39* --  CALCIUM -- 8.4 9.0 8.2*  ALB -- -- -- --  PHOS -- -- -- --   Liver Function Tests:  Lab 03/09/12 0833  AST 22  ALT 30  ALKPHOS 90  BILITOT 0.1*  PROT 6.0  ALBUMIN 2.6*   No results found for this basename: LIPASE:3,AMYLASE:3 in the last 168 hours No results found for this basename: AMMONIA:3 in the last 168 hours CBC:  Lab 03/11/12 1030 03/10/12 0440 03/09/12 0833 03/05/12 0655  WBC 16.5* 20.7* 22.9* --  NEUTROABS 12.5* -- 20.0* --  HGB 9.4* 9.1* 10.8* --  HCT 28.6* 28.4* 32.6* --  MCV 82.7 83.3 83.0 84.6  PLT 85* 115* 133* --   Cardiac Enzymes:  Lab 03/09/12 0815  CKTOTAL 38  CKMB  1.1  CKMBINDEX --  TROPONINI <0.30   CBG: No results found for this basename: GLUCAP:5 in the last 168 hours  Iron Studies: No results found for this basename: IRON,TIBC,TRANSFERRIN,FERRITIN in the last 72 hours Studies/Results: No results found. Medications:      . albuterol  2.5 mg Nebulization Q6H  . amLODipine  5 mg Oral Daily  . benztropine  1 mg Oral TID  . carbamazepine  200 mg Oral BID  . chlordiazePOXIDE  25 mg Oral TID  . folic acid  1 mg Oral Daily  . haloperidol  10 mg Oral BID  . ipratropium  0.5 mg Nebulization Q6H  . levofloxacin  750 mg Oral Daily  . nicotine  21 mg Transdermal Q0600  . pantoprazole  40 mg Oral Daily  . thiamine  100 mg Oral Daily  . traZODone  150 mg Oral QHS  . DISCONTD: piperacillin-tazobactam (ZOSYN)  IV  3.375 g Intravenous Q8H  . DISCONTD: vancomycin  1,000 mg Intravenous Q12H    I  have reviewed scheduled and prn medications.     Problem/Plan: Principal Problem:  *HCAP (healthcare-associated pneumonia) Active Problems:  Schizophrenia, paranoid, chronic  HTN (hypertension)  GERD (gastroesophageal reflux disease)  Fever  Leukocytosis  CKD (chronic kidney disease) stage 2, GFR 60-89 ml/min  SOB (shortness of breath)  Nausea & vomiting  1. Febrile illness on admission: Unclear etiology. May have been due to acute viral gastroenteritis versus healthcare associated pneumonia. Of note patient chest x-ray on admission was actually better than recent admission. Defervesced.  2. Healthcare associated pneumonia: Improving clinically. Patient is day #3 of intravenous vancomycin and Zosyn. No fever since admission. Unfortunately no blood cultures were sent. Will change to oral Levaquin to complete a total of 8 days course of antibiotics. Monitor and if he has fever again then may need infectious disease evaluation. Leukocytosis is improving. 3. Hypoxia: Secondary to problem #2. Improved. We'll have nursing check patient O2 sats on room air  at rest and with activity.? Element of COPD. 4. Anemia and thrombocytopenia: Unclear etiology. Anemia is stable but platelet counts are slightly lower. Will repeat CBCs tomorrow. Patient has not been on heparin products this or previous admission.? Secondary to antibiotics. 5. Chronic kidney disease, stage III: Said to be at baseline. Creatinine stable. 6. Nausea and vomiting:? Viral gastroenteritis. Improved. Tolerating diet. 7. Hypertension: Controlled. 8. Schizophrenia, paranoid chronic: Continue psychiatric medications. Will request psychiatric consultation today to decide if patient will need to return back to [facility] once medically stable. 9. Tobacco abuse: Nicotine patch 10. GERD: Continue PPI.  Disposition: If patient continues to be afebrile and stable, from a medical standpoint he may be ready for discharge tomorrow. Location of discharge will be decided pending psychiatric evaluation.  David [name] 03/11/2012,2:27 PM  LOS: 2 days   Addendum: Psychiatry consultation called.  David [name] 4:55 PM

## 2012-03-11 NOTE — Progress Notes (Signed)
Patient 95% or RA at rest. 97% when ambulating in the hall.

## 2012-03-11 NOTE — Progress Notes (Signed)
Notified pharmacy of critical vancomycin trough of 55.2 Means, Mallika Sanmiguel N 03-11-12 12:13pm

## 2012-03-12 ENCOUNTER — Telehealth (HOSPITAL_COMMUNITY): Payer: Self-pay | Admitting: *Deleted

## 2012-03-12 ENCOUNTER — Inpatient Hospital Stay (HOSPITAL_COMMUNITY)
Admission: AD | Admit: 2012-03-12 | Discharge: 2012-03-17 | DRG: 885 | Disposition: A | Discharge status: Home / Self Care | Payer: Medicare Other | Source: Ambulatory Visit | Attending: Psychiatry | Admitting: Psychiatry

## 2012-03-12 ENCOUNTER — Encounter (HOSPITAL_COMMUNITY): Payer: Self-pay | Admitting: *Deleted

## 2012-03-12 DIAGNOSIS — J159 Unspecified bacterial pneumonia: Secondary | ICD-10-CM | POA: Diagnosis not present

## 2012-03-12 DIAGNOSIS — Z79899 Other long term (current) drug therapy: Secondary | ICD-10-CM | POA: Diagnosis not present

## 2012-03-12 DIAGNOSIS — R45850 Homicidal ideations: Secondary | ICD-10-CM

## 2012-03-12 DIAGNOSIS — Z888 Allergy status to other drugs, medicaments and biological substances status: Secondary | ICD-10-CM

## 2012-03-12 DIAGNOSIS — F200 Paranoid schizophrenia: Secondary | ICD-10-CM

## 2012-03-12 DIAGNOSIS — R0902 Hypoxemia: Secondary | ICD-10-CM | POA: Diagnosis not present

## 2012-03-12 DIAGNOSIS — F411 Generalized anxiety disorder: Secondary | ICD-10-CM

## 2012-03-12 DIAGNOSIS — I10 Essential (primary) hypertension: Secondary | ICD-10-CM | POA: Diagnosis not present

## 2012-03-12 DIAGNOSIS — R509 Fever, unspecified: Secondary | ICD-10-CM | POA: Diagnosis not present

## 2012-03-12 DIAGNOSIS — R112 Nausea with vomiting, unspecified: Secondary | ICD-10-CM | POA: Diagnosis not present

## 2012-03-12 HISTORY — DX: Anxiety disorder, unspecified: F41.9

## 2012-03-12 MED ORDER — FOLIC ACID 1 MG PO TABS
1.0000 mg | ORAL_TABLET | Freq: Every day | ORAL | Status: DC
  Administered 2012-03-13 – 2012-03-17 (×5): 1 mg via ORAL
  Filled 2012-03-12 (×6): qty 1
  Filled 2012-03-12: qty 2

## 2012-03-12 MED ORDER — LEVOFLOXACIN 750 MG PO TABS: 750.0000 mg | ORAL_TABLET | Freq: Every day | ORAL | Status: DC

## 2012-03-12 MED ORDER — ALBUTEROL SULFATE (5 MG/ML) 0.5% IN NEBU
2.5000 mg | INHALATION_SOLUTION | Freq: Three times a day (TID) | RESPIRATORY_TRACT | Status: DC
  Administered 2012-03-13: 2.5 mg via RESPIRATORY_TRACT
  Filled 2012-03-12 (×5): qty 0.5

## 2012-03-12 MED ORDER — CARBAMAZEPINE 200 MG PO TABS
200.0000 mg | ORAL_TABLET | Freq: Two times a day (BID) | ORAL | Status: DC
  Administered 2012-03-12 – 2012-03-13 (×2): 200 mg via ORAL
  Filled 2012-03-12 (×5): qty 1

## 2012-03-12 MED ORDER — LORAZEPAM 1 MG PO TABS
1.0000 mg | ORAL_TABLET | Freq: Three times a day (TID) | ORAL | Status: DC | PRN
  Administered 2012-03-12: 1 mg via ORAL
  Filled 2012-03-12: qty 1

## 2012-03-12 MED ORDER — BENZTROPINE MESYLATE 1 MG PO TABS
1.0000 mg | ORAL_TABLET | Freq: Three times a day (TID) | ORAL | Status: DC
  Administered 2012-03-13: 1 mg via ORAL
  Filled 2012-03-12 (×4): qty 1

## 2012-03-12 MED ORDER — MORPHINE SULFATE 4 MG/ML IJ SOLN: 1.0000 mg | Freq: Four times a day (QID) | INTRAMUSCULAR | Status: DC | PRN

## 2012-03-12 MED ORDER — VITAMIN B-1 100 MG PO TABS
100.0000 mg | ORAL_TABLET | Freq: Every day | ORAL | Status: DC
  Administered 2012-03-13 – 2012-03-17 (×5): 100 mg via ORAL
  Filled 2012-03-12 (×2): qty 1
  Filled 2012-03-12: qty 2
  Filled 2012-03-12 (×4): qty 1

## 2012-03-12 MED ORDER — ALBUTEROL SULFATE (5 MG/ML) 0.5% IN NEBU
2.5000 mg | INHALATION_SOLUTION | RESPIRATORY_TRACT | Status: DC | PRN
  Administered 2012-03-16 (×2): 2.5 mg via RESPIRATORY_TRACT

## 2012-03-12 MED ORDER — MAGNESIUM HYDROXIDE 400 MG/5ML PO SUSP: 30.0000 mL | Freq: Every day | ORAL | Status: DC | PRN

## 2012-03-12 MED ORDER — AMLODIPINE BESYLATE 5 MG PO TABS
5.0000 mg | ORAL_TABLET | Freq: Every day | ORAL | Status: DC
  Administered 2012-03-13 – 2012-03-17 (×5): 5 mg via ORAL
  Filled 2012-03-12 (×2): qty 1
  Filled 2012-03-12: qty 2
  Filled 2012-03-12 (×4): qty 1

## 2012-03-12 MED ORDER — ACETAMINOPHEN 325 MG PO TABS: 650.0000 mg | ORAL_TABLET | Freq: Four times a day (QID) | ORAL | Status: DC | PRN

## 2012-03-12 MED ORDER — ALUM & MAG HYDROXIDE-SIMETH 200-200-20 MG/5ML PO SUSP
30.0000 mL | ORAL | Status: DC | PRN
  Administered 2012-03-16: 30 mL via ORAL

## 2012-03-12 MED ORDER — CHLORDIAZEPOXIDE HCL 25 MG PO CAPS
25.0000 mg | ORAL_CAPSULE | Freq: Three times a day (TID) | ORAL | Status: DC
  Administered 2012-03-13: 25 mg via ORAL
  Filled 2012-03-12: qty 1

## 2012-03-12 MED ORDER — LEVOFLOXACIN 750 MG PO TABS
750.0000 mg | ORAL_TABLET | Freq: Every day | ORAL | Status: AC
  Administered 2012-03-13 – 2012-03-17 (×5): 750 mg via ORAL
  Filled 2012-03-12 (×5): qty 1

## 2012-03-12 MED ORDER — GUAIFENESIN-DM 100-10 MG/5ML PO SYRP
5.0000 mL | ORAL_SOLUTION | ORAL | Status: DC | PRN
  Administered 2012-03-13 – 2012-03-14 (×2): 5 mL via ORAL

## 2012-03-12 MED ORDER — HALOPERIDOL 5 MG PO TABS
10.0000 mg | ORAL_TABLET | Freq: Two times a day (BID) | ORAL | Status: DC
  Administered 2012-03-12 – 2012-03-13 (×2): 10 mg via ORAL
  Filled 2012-03-12 (×4): qty 2

## 2012-03-12 MED ORDER — LORAZEPAM 1 MG PO TABS
1.0000 mg | ORAL_TABLET | Freq: Three times a day (TID) | ORAL | Status: DC | PRN
  Administered 2012-03-14 – 2012-03-16 (×3): 1 mg via ORAL
  Filled 2012-03-12 (×3): qty 1

## 2012-03-12 MED ORDER — PANTOPRAZOLE SODIUM 40 MG PO TBEC
40.0000 mg | DELAYED_RELEASE_TABLET | Freq: Every day | ORAL | Status: DC
  Administered 2012-03-13: 40 mg via ORAL
  Filled 2012-03-12 (×2): qty 1

## 2012-03-12 MED ORDER — NICOTINE 21 MG/24HR TD PT24
21.0000 mg | MEDICATED_PATCH | Freq: Every day | TRANSDERMAL | Status: DC
  Administered 2012-03-13 – 2012-03-17 (×4): 21 mg via TRANSDERMAL
  Filled 2012-03-12 (×9): qty 1

## 2012-03-12 MED ORDER — IPRATROPIUM BROMIDE 0.02 % IN SOLN
0.5000 mg | Freq: Three times a day (TID) | RESPIRATORY_TRACT | Status: DC
  Administered 2012-03-13 (×2): 0.5 mg via RESPIRATORY_TRACT
  Filled 2012-03-12 (×5): qty 2.5

## 2012-03-12 MED ORDER — TRAZODONE HCL 50 MG PO TABS
150.0000 mg | ORAL_TABLET | Freq: Every day | ORAL | Status: DC
  Administered 2012-03-12 – 2012-03-16 (×5): 150 mg via ORAL
  Filled 2012-03-12: qty 6
  Filled 2012-03-12 (×6): qty 3

## 2012-03-12 NOTE — Progress Notes (Signed)
Subjective: No new issues, apart from a tendency to wander. Declining blood draws.  Objective: Vital signs in last 24 hours: Temp:  [97.8 F (36.6 C)-98.9 F (37.2 C)] 97.8 F (36.6 C) (03/13 0610) Pulse Rate:  [89-98] 89  (03/13 0610) Resp:  [18-22] 20  (03/13 0610) BP: (117-148)/(66-77) 148/77 mmHg (03/13 1014) SpO2:  [91 %-97 %] 95 % (03/13 0824) Weight change:  Last BM Date: 03/11/12  Intake/Output from previous day: 03/12 0701 - 03/13 0700 In: 1920 [P.O.:1920] Out: 1100 [Urine:1100]     Physical Exam: General: Ambulating, comfortable, alert, communicative, fully oriented, not short of breath at rest.  HEENT:  Mild clinical pallor, no jaundice, no conjunctival injection or discharge. NECK:  Supple, JVP not seen, no carotid bruits, no palpable lymphadenopathy, no palpable goiter. CHEST:  Clinically clear to auscultation, no wheezes, no crackles. HEART:  Sounds 1 and 2 heard, normal, regular, no murmurs. ABDOMEN:  Full, soft, non-tender, no palpable organomegaly, no palpable masses, normal bowel sounds. GENITALIA:  Not examined. LOWER EXTREMITIES:  No pitting edema, palpable peripheral pulses. MUSCULOSKELETAL SYSTEM:  Unremarkable. CENTRAL NERVOUS SYSTEM:  No focal neurologic deficit on gross examination.  Lab Results:  Basename 03/11/12 1030 03/10/12 0440  WBC 16.5* 20.7*  HGB 9.4* 9.1*  HCT 28.6* 28.4*  PLT 85* 115*    Basename 03/11/12 1030 03/10/12 0440  NA -- 136  K -- 3.8  CL -- 102  CO2 -- 30  GLUCOSE -- 95  BUN -- 17  CREATININE 1.56* 1.52*  CALCIUM -- 8.4   Recent Results (from the past 240 hour(s))  CULTURE, BLOOD (ROUTINE X 2)     Status: Normal   Collection Time   03/04/12 11:00 AM      Component Value Range Status Comment   Specimen Description BLOOD RIGHT HAND   Final    Special Requests BOTTLES DRAWN AEROBIC AND ANAEROBIC 5CC.   Final    Culture  Setup Time [id number]   Final    Culture NO GROWTH 5 DAYS   Final    Report Status  03/10/2012 FINAL   Final   CULTURE, BLOOD (ROUTINE X 2)     Status: Normal   Collection Time   03/04/12 11:00 AM      Component Value Range Status Comment   Specimen Description BLOOD LEFT HAND   Final    Special Requests BOTTLES DRAWN AEROBIC AND ANAEROBIC 5CC.   Final    Culture  Setup Time [id number]   Final    Culture NO GROWTH 5 DAYS   Final    Report Status 03/10/2012 FINAL   Final   CULTURE, BLOOD (ROUTINE X 2)     Status: Normal (Preliminary result)   Collection Time   03/09/12  8:10 AM      Component Value Range Status Comment   Specimen Description BLOOD RIGHT ARM   Final    Special Requests BOTTLES DRAWN AEROBIC AND ANAEROBIC 3CC   Final    Culture  Setup Time [id number]   Final    Culture     Final    Value:        BLOOD CULTURE RECEIVED NO GROWTH TO DATE CULTURE WILL BE HELD FOR 5 DAYS BEFORE ISSUING A FINAL NEGATIVE REPORT   Report Status PENDING   Incomplete   CULTURE, BLOOD (ROUTINE X 2)     Status: Normal (Preliminary result)   Collection Time   03/09/12  8:15 AM      Component Value Range  Status Comment   Specimen Description BLOOD LEFT ARM   Final    Special Requests BOTTLES DRAWN AEROBIC ONLY 2CC   Final    Culture  Setup Time [id number]   Final    Culture     Final    Value:        BLOOD CULTURE RECEIVED NO GROWTH TO DATE CULTURE WILL BE HELD FOR 5 DAYS BEFORE ISSUING A FINAL NEGATIVE REPORT   Report Status PENDING   Incomplete   MRSA PCR SCREENING     Status: Normal   Collection Time   03/10/12 11:05 PM      Component Value Range Status Comment   MRSA by PCR NEGATIVE  NEGATIVE  Final      Studies/Results: No results found.  Medications: Scheduled Meds:   . albuterol  2.5 mg Nebulization TID  . amLODipine  5 mg Oral Daily  . benztropine  1 mg Oral TID  . carbamazepine  200 mg Oral BID  . chlordiazePOXIDE  25 mg Oral TID  . folic acid  1 mg Oral Daily  . haloperidol  10 mg Oral BID  . ipratropium  0.5 mg Nebulization TID  . levofloxacin  750 mg  Oral Daily  . nicotine  21 mg Transdermal Q0600  . pantoprazole  40 mg Oral Daily  . thiamine  100 mg Oral Daily  . traZODone  150 mg Oral QHS  . DISCONTD: albuterol  2.5 mg Nebulization Q6H  . DISCONTD: ipratropium  0.5 mg Nebulization Q6H  . DISCONTD: piperacillin-tazobactam (ZOSYN)  IV  3.375 g Intravenous Q8H  . DISCONTD: vancomycin  1,000 mg Intravenous Q12H   Continuous Infusions:  PRN Meds:.acetaminophen, acetaminophen, albuterol, guaiFENesin-dextromethorphan, ipratropium, LORazepam, morphine, ondansetron (ZOFRAN) IV, ondansetron, zolpidem  Assessment/Plan:  1. Febrile illness on admission: Etiologies include possible acute viral gastroenteritis versus healthcare associated pneumonia. Now resolved. 2. Acute G/Enteritis: Patient presented with nausea/vomiting. Although this may have been occasioned by pneumonia, CXR at presentation indicted interval improvement. He was managed with supportive care, with speedy resolution of symptoms. 3. Healthcare associated pneumonia: Clinically much improved. Patient completed day #3 of intravenous Vancomycin and Zosyn on 03/11/12, and was transitioned to oral Levaquin, for a further 4 days of antibiotics. He has defervesced, feels considerably better, and wcc has trended down nicely, although patient at this time, is declining further blood draws. Unfortunately no blood cultures were sent. 4. Hypoxia: Secondary to problem #3. Now resolved. 5. Anemia and thrombocytopenia: Unclear etiology. Anemia has been stable but platelet counts are slightly lower. Patient has not been on heparin products this or previous admission.? Secondary to antibiotics. As noted above, patient is declining further lab work. 6. Chronic kidney disease, stage III: Said to be at baseline. Creatinine stable. 7. Hypertension: Controlled. 8. Schizophrenia, paranoid chronic: Continued on psychiatric medications. Patient was seen by Dr Wonda Cerise, psychiatrist, and he has recommended in  patient management at [facility] once medically stable. 9. Tobacco abuse: Counseled, and managed with Nicotine patch 10. GERD: Asymptomatic on PPI. 11.  Disposition: Stable from medical standpoint for discharge today, to inpatient psychiatric facility.   LOS: 3 days   David [name],David [name] 03/12/2012, 11:58 AM

## 2012-03-12 NOTE — Discharge Instructions (Signed)
Follow up with psychiatrist and primary care physician, on discharge from [facility]

## 2012-03-12 NOTE — Progress Notes (Signed)
Per phlebotomy pt refused am labs. Will continue to monitor.

## 2012-03-12 NOTE — Tx Team (Signed)
Initial Interdisciplinary Treatment Plan  PATIENT STRENGTHS: (choose at least two) Average or above average intelligence General fund of knowledge Supportive family/friends  PATIENT STRESSORS: Health problems Medication change or noncompliance   PROBLEM LIST: Problem List/Patient Goals Date to be addressed Date deferred Reason deferred Estimated date of resolution  Schizophrenia, paranoia                                                       DISCHARGE CRITERIA:  Ability to meet basic life and health needs Improved stabilization in mood, thinking, and/or behavior Medical problems require only outpatient monitoring Need for constant or close observation no longer present  PRELIMINARY DISCHARGE PLAN: Return to previous living arrangement  PATIENT/FAMIILY INVOLVEMENT: This treatment plan has been presented to and reviewed with the patient, Jerrelle, Michelsen 03/12/2012, 9:59 PM

## 2012-03-12 NOTE — Progress Notes (Signed)
Spoke with Pt re: d/c plans.  Pt focused on a lady at [facility] who owes him money.  He stated several times that he wants his money from her for the job that he did for [employer] and that he's not going to continue to lie about his inspection.  CSW validated Pt's frustrations and allowed him to elaborate on his concerns.  Pt agreeable to return to [facility].  Pt signed consent.  Faxed consent to [facility], in the off chance that a bed becomes available today.  If no bed is available today, CSW will obtain Pt's signatures on a new consent tomorrow.  CSW to continue to follow.  Providence Crosby, LCSWA Clinical Social Work [phone]

## 2012-03-12 NOTE — Progress Notes (Signed)
RN gave report to Neurosurgeon on Adult Unit at [facility]. Per AC's request, pt transfer will be done after shift change. Information and all necessary documentation passed on to night RN. Malasha Kleppe L, RN

## 2012-03-12 NOTE — Progress Notes (Signed)
CSW Ax'd Pt during his recent, previous admission:   Deno Lunger, LCSW Social Worker Signed Social Services Progress Notes 03/05/2012 3:04 PM  Related encounter: Admission (Discharged) from 03/04/2012 in [facility]  Met with Pt to discuss current admission.  Pt is a 56 year old, white male who has been dx'd with Schizophrenia. Pt currently suffering with delusions and paranoia.  Pt continually discussed the $22 million that [facility] was going to give to him for his bogus health inspection. 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  Pt was tangential and had pxs with echolalia. Pt laughed inappropriately and his affect ranged from happy to irritable and agitated.  Pt was a poor historian and had poor insight. Information obtained in CSW's Ax is questionable.  Pt denied SI. He reported HI, by hx, but was not inclined to elaborate.  Pt reported using MDA and LSD and stated that he drinks 1 shot or flask of Christiane Ha per day.  Pt reports that he has "3 children in the sky", that he lives with his brother, [name], and that he was married for 12 years.  Pt reports that he's currently on disability due to his dx of Schizophrenia.  CSW left message for Pt's mom, [name], at [phone].  Spoke with Pt's mom. Pt's mom stated that Pt had been doing well for the past 6 years. She had been going to [facility] in [place] 1x per month for Haldol. His medication regimen consisted of Haldol, Librium, Protonix and Ambien. Pt's mom stated that Pt attempted to get his Librium Rx on February 1st and he learned that his insurance no longer covered this med.  Pt's mom stated that Pt's step-dad died 01/25/24of this year and Pt's aunt died 02-23-24of this year. Pt witnessed his aunt pass away. Pt had been off of his Librium a week prior to this incident and mom feels that these deaths, coupled with no anti-psychotic, sent Pt "over the edge."    Pt's mom concerned about Pt being able to pay for his Librium and states that this is the only med that works for Pt. CSW encouraged Pt's mom to contact the insurance company with her concerns and inquire into whether they'd consider a note from Pt's MD stating medical necessity for this med. Mom to do this and contact CSW if she's successful.  CSW thanked Pt's mom for her time.  CSW to continue to follow.  Providence Crosby, LCSWA  Clinical Social Work  [phone]

## 2012-03-12 NOTE — Progress Notes (Signed)
  David [name] is a 56 y.o. male [id number] 1956-07-04  Subjective/Objective: Patient seen today and chart reviewed. Patient continued to exhibit significant psychosis. He believes he is in the hospital for a special mission. 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 He gets easily irritable and believe hospital owes his money. He admitted poor sleep but denies any aggression. He was seen talking to himself irrational. However he has been compliant with her medication.  Mental status examination Patient is fairly dressed and fairly groomed. His attention and concentration is poor he is seen talking to himself seems like responding to internal stimuli. He remains delusional and disorganized. His attention and concentration is poor. His insight judgment and impulse control is limited. He denies any active suicidal thinking. His thought processes tangential. His speech is relevant.   Filed Vitals:   03/12/12 1014  BP: 148/77  Pulse:   Temp:   Resp:     Lab Results:   BMET    Component Value Date/Time   NA 136 03/10/2012 0440   K 3.8 03/10/2012 0440   CL 102 03/10/2012 0440   CO2 30 03/10/2012 0440   GLUCOSE 95 03/10/2012 0440   BUN 17 03/10/2012 0440   CREATININE 1.56* 03/11/2012 1030   CALCIUM 8.4 03/10/2012 0440   GFRNONAA 49* 03/11/2012 1030   GFRAA 56* 03/11/2012 1030    Medications:  Scheduled:     . albuterol  2.5 mg Nebulization TID  . amLODipine  5 mg Oral Daily  . benztropine  1 mg Oral TID  . carbamazepine  200 mg Oral BID  . chlordiazePOXIDE  25 mg Oral TID  . folic acid  1 mg Oral Daily  . haloperidol  10 mg Oral BID  . ipratropium  0.5 mg Nebulization TID  . levofloxacin  750 mg Oral Daily  . nicotine  21 mg Transdermal Q0600  . pantoprazole  40 mg Oral Daily  . thiamine  100 mg Oral Daily  . traZODone  150 mg Oral QHS  . DISCONTD: albuterol  2.5 mg Nebulization Q6H  . DISCONTD: ipratropium  0.5 mg Nebulization Q6H  . DISCONTD: piperacillin-tazobactam (ZOSYN)   IV  3.375 g Intravenous Q8H  . DISCONTD: vancomycin  1,000 mg Intravenous Q12H     PRN Meds acetaminophen, acetaminophen, albuterol, guaiFENesin-dextromethorphan, ipratropium, LORazepam, morphine, ondansetron (ZOFRAN) IV, ondansetron, zolpidem  Assessment Schizophrenia chronic paranoid type  Plan Patient will require psychiatric inpatient treatment. Once medically stable consider transfer to [facility] for inpatient treatment.

## 2012-03-12 NOTE — Discharge Summary (Signed)
Physician Discharge Summary  Patient ID: David [name] MRN: [id number] DOB/AGE: 03/20/1956 56 y.o.  Admit date: 03/09/2012 Discharge date: 03/12/2012  Primary Care Physician:  Sheila Oats, MD, MD   Discharge Diagnoses:    Patient Active Problem List  Diagnoses  . Schizophrenia, paranoid, chronic  . HCAP (healthcare-associated pneumonia)  . HTN (hypertension)  . GERD (gastroesophageal reflux disease)  . Hypoxia  . Fever  . Leukocytosis  . Insomnia  . CKD (chronic kidney disease) stage 2, GFR 60-89 ml/min  . SOB (shortness of breath)  . Nausea & vomiting    Medication List  As of 03/12/2012  4:26 PM   STOP taking these medications         haloperidol 20 MG tablet         TAKE these medications         amLODipine 5 MG tablet   Commonly known as: NORVASC   Take 1 tablet (5 mg total) by mouth daily.      benztropine 1 MG tablet   Commonly known as: COGENTIN   Take 1 tablet (1 mg total) by mouth 3 (three) times daily.      carbamazepine 200 MG tablet   Commonly known as: TEGRETOL   Take 1 tablet (200 mg total) by mouth 2 (two) times daily.      chlordiazePOXIDE 25 MG capsule   Commonly known as: LIBRIUM   Take 1 capsule (25 mg total) by mouth 3 (three) times daily.      folic acid 1 MG tablet   Commonly known as: FOLVITE   Take 1 tablet (1 mg total) by mouth daily.      haloperidol 10 MG tablet   Commonly known as: HALDOL   Take 1 tablet (10 mg total) by mouth 2 (two) times daily at 8am and 2pm.      levofloxacin 750 MG tablet   Commonly known as: LEVAQUIN   Take 1 tablet (750 mg total) by mouth daily.      nicotine 21 mg/24hr patch   Commonly known as: NICODERM CQ - dosed in mg/24 hours   Place 1 patch onto the skin daily at 6 (six) AM.      pantoprazole 40 MG tablet   Commonly known as: PROTONIX   Take 40 mg by mouth daily.      thiamine 100 MG tablet   Take 1 tablet (100 mg total) by mouth daily.      traZODone 150 MG tablet   Commonly  known as: DESYREL   Take 1 tablet (150 mg total) by mouth at bedtime.      zolpidem 5 MG tablet   Commonly known as: AMBIEN   Take 1 tablet (5 mg total) by mouth at bedtime as needed for sleep (insomnia).             Disposition and Follow-up:  Follow up with primary MD and primary psychiatrist.  Consults:  psychiatry  Dr Kathryne Sharper. Dr Wonda Cerise.  Significant Diagnostic Studies:  No results found.  Brief H and P: For complete details, refer to admission H and P. However, in brief, this is a 56 year old male,  with a history significant of hypertension, gastroesophageal reflux disease, alcohol abuse and paranoid schizophrenia; who was admitted on 2/213 with  HCAP and discharged after 2 days of IV antibiotics, to complete a course of oral Levaquin. Patient returned from [facility] due to increased work of breathing; high temp, associated cough and also nausea and  vomiting. In the ED he was found to have elevated wbc's, mild hypoxia and a CXR demonstrating improved PNA in comparison with previous x-ray. He was admitted for further or further evaluation, investigation and treatment.   Physical Exam: On 03/12/12. General: Ambulating, comfortable, alert, communicative, fully oriented, not short of breath at rest.  HEENT: Mild clinical pallor, no jaundice, no conjunctival injection or discharge.  NECK: Supple, JVP not seen, no carotid bruits, no palpable lymphadenopathy, no palpable goiter.  CHEST: Clinically clear to auscultation, no wheezes, no crackles.  HEART: Sounds 1 and 2 heard, normal, regular, no murmurs.  ABDOMEN: Full, soft, non-tender, no palpable organomegaly, no palpable masses, normal bowel sounds.  GENITALIA: Not examined.  LOWER EXTREMITIES: No pitting edema, palpable peripheral pulses.  MUSCULOSKELETAL SYSTEM: Unremarkable.  CENTRAL NERVOUS SYSTEM: No focal neurologic deficit on gross examination.  Hospital Course:  1. Febrile illness: Patient presented as described  above. Etiologies included possible acute viral gastroenteritis versus healthcare associated pneumonia. This has now resolved. See below. 2. Acute G/Enteritis: Patient presented with nausea/vomiting. Although this may have been occasioned by pneumonia, CXR at presentation indicated interval improvement. He was managed with supportive care, with speedy resolution of symptoms. This may indeed, have been a self-limited viral condition. 3. Healthcare associated pneumonia: Clinically much improved. Patient was initially commenced on intravenous Vancomycin and Zosyn, completed 3 days of these antibiotics on 03/11/12, and was transitioned to oral Levaquin, for a further 4 days of antibiotics. He has defervesced, feels considerably better, and wcc has trended down nicely, although patient at this time, is declining further blood draws. Unfortunately no blood cultures were sent. 4. Hypoxia: Secondary to problem #3. Now resolved. 5. Anemia and thrombocytopenia: Unclear etiology. Anemia has been stable but platelet counts are slightly lower. Patient has not been on Heparin products this or previous admission, and this mild thrombocytopenia may be secondary to antibiotics. As noted above, patient is declining further lab work. Periodic CBCs, will be needed, following discharge. 6. Chronic kidney disease, stage [name]: Said to be at baseline. Creatinine has been stable. 7. Hypertension: Controlled. 8. Schizophrenia, paranoid chronic: Continued on pre-admission psychiatric medications. Patient was seen by Dr Wonda Cerise, psychiatrist initially, then by Dr Lolly Mustache on 03/12/12. He has recommended in patient management at [facility], once medically stable. 9. Tobacco abuse: Counseled, and managed with Nicotine patch 10. GERD: Asymptomatic on PPI.  Comment: Stable for discharge on 03/12/12. Time spent on Discharge: 45 mins.  Signed: Merrianne Mccumbers,CHRISTOPHER 03/12/2012, 4:26 PM

## 2012-03-12 NOTE — BH Assessment (Signed)
Assessment Note   David [name] is an 56 y.o. male.Pt was in patient for acute psychosis, grandiosity and verbal combativeness. Discharged to medical floor with fever and LL lobe pneumonia.  Pt has been medically stabilized but remains paranoid, AH/VH and delusional.  Per Sheela Stack MD 03/12/2012: Patient seen today and chart reviewed. Patient continued to exhibit significant psychosis. He believes he is in the hospital for a special mission. 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 He gets easily irritable and believe hospital owes his money. He admitted poor sleep but denies any aggression. He was seen talking to himself irrational. However he has been compliant with her medication.  Mental status examination  Patient is fairly dressed and fairly groomed. His attention and concentration is poor he is seen talking to himself seems like responding to internal stimuli. He remains delusional and disorganized. His attention and concentration is poor. His insight judgment and impulse control is limited. He denies any active suicidal thinking. His thought processes tangential. His speech is relevant.  Told CSW 03/12/2012: Pt focused on a lady at [facility] who owes him money. 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   Accepted by Lonzo Cloud NP for Dr Laveda Norman service, inpatient [facility].  Axis I: Schizophrenia, paranoid type Axis II: Deferred Axis [name]:  Past Medical History  Diagnosis Date  . Schizophrenia   . Hypertension   . Pneumonia    Axis IV: housing problems, other psychosocial or environmental problems, problems related to social environment and problems with primary support group Axis V: 21-30 behavior considerably influenced by delusions or hallucinations OR serious impairment in judgment, communication OR inability to function in almost all areas  Past Medical History:  Past  Medical History  Diagnosis Date  . Schizophrenia   . Hypertension   . Pneumonia     Past Surgical History  Procedure Date  . Tonsillectomy     Family History: No family history on file.  Social History:  reports that he has been smoking Cigarettes.  He has a 60 pack-year smoking history. He has never used smokeless tobacco. He reports that he drinks alcohol. He reports that he uses illicit drugs (Cocaine and Marijuana).  Additional Social History:    Allergies:  Allergies  Allergen Reactions  . Lithium Other (See Comments)    "get's very sick"    Home Medications:  Medications Prior to Admission  Medication Dose Route Frequency Provider Last Rate Last Dose  . acetaminophen (TYLENOL) tablet 650 mg  650 mg Oral Q6H PRN Vassie Loll, MD   650 mg at 03/10/12 2033   Or  . acetaminophen (TYLENOL) suppository 650 mg  650 mg Rectal Q6H PRN Vassie Loll, MD      . albuterol (PROVENTIL) (5 MG/ML) 0.5% nebulizer solution 2.5 mg  2.5 mg Nebulization Q4H PRN Elease Etienne, MD   2.5 mg at 03/10/12 0535  . albuterol (PROVENTIL) (5 MG/ML) 0.5% nebulizer solution 2.5 mg  2.5 mg Nebulization TID Elease Etienne, MD   2.5 mg at 03/12/12 1357  . amLODipine (NORVASC) tablet 5 mg  5 mg Oral Daily Vassie Loll, MD   5 mg at 03/12/12 1014  . benztropine (COGENTIN) tablet 1 mg  1 mg Oral TID Vassie Loll, MD   1 mg at 03/10/12 0936  . carbamazepine (TEGRETOL) tablet 200 mg  200  mg Oral BID Vassie Loll, MD   200 mg at 03/11/12 2209  . chlordiazePOXIDE (LIBRIUM) capsule 25 mg  25 mg Oral TID Vassie Loll, MD   25 mg at 03/12/12 1521  . folic acid (FOLVITE) tablet 1 mg  1 mg Oral Daily Vassie Loll, MD   1 mg at 03/12/12 1016  . guaiFENesin-dextromethorphan (ROBITUSSIN DM) 100-10 MG/5ML syrup 5 mL  5 mL Oral Q4H PRN Vassie Loll, MD   5 mL at 03/11/12 2209  . haloperidol (HALDOL) tablet 10 mg  10 mg Oral BID Vassie Loll, MD   10 mg at 03/12/12 1016  . ipratropium (ATROVENT) nebulizer  solution 0.5 mg  0.5 mg Nebulization Q4H PRN Elease Etienne, MD   0.5 mg at 03/10/12 0535  . ipratropium (ATROVENT) nebulizer solution 0.5 mg  0.5 mg Nebulization TID Elease Etienne, MD   0.5 mg at 03/12/12 1358  . levofloxacin (LEVAQUIN) tablet 750 mg  750 mg Oral Daily Elease Etienne, MD   750 mg at 03/12/12 1019  . LORazepam (ATIVAN) tablet 1 mg  1 mg Oral Q8H PRN Laveda Norman, MD   1 mg at 03/12/12 1243  . morphine 2 MG/ML injection 1 mg  1 mg Intravenous Q6H PRN Vassie Loll, MD      . nicotine (NICODERM CQ - dosed in mg/24 hours) patch 21 mg  21 mg Transdermal Q0600 Vassie Loll, MD   21 mg at 03/12/12 0604  . ondansetron (ZOFRAN) tablet 4 mg  4 mg Oral Q6H PRN Vassie Loll, MD       Or  . ondansetron [facility]) injection 4 mg  4 mg Intravenous Q6H PRN Vassie Loll, MD      . pantoprazole (PROTONIX) EC tablet 40 mg  40 mg Oral Daily Vassie Loll, MD   40 mg at 03/12/12 1016  . thiamine (VITAMIN B-1) tablet 100 mg  100 mg Oral Daily Vassie Loll, MD   100 mg at 03/12/12 1017  . traZODone (DESYREL) tablet 150 mg  150 mg Oral QHS Vassie Loll, MD   150 mg at 03/11/12 2209  . zolpidem (AMBIEN) tablet 5 mg  5 mg Oral QHS PRN Vassie Loll, MD      . DISCONTD: albuterol (PROVENTIL) (5 MG/ML) 0.5% nebulizer solution 2.5 mg  2.5 mg Nebulization Q6H Elease Etienne, MD   2.5 mg at 03/11/12 2100  . DISCONTD: ipratropium (ATROVENT) nebulizer solution 0.5 mg  0.5 mg Nebulization Q6H Elease Etienne, MD   0.5 mg at 03/11/12 2100  . DISCONTD: piperacillin-tazobactam (ZOSYN) IVPB 3.375 g  3.375 g Intravenous Q8H Maryanna Shape Runyon, PHARMD   3.375 g at 03/11/12 0807  . DISCONTD: vancomycin (VANCOCIN) IVPB 1000 mg/200 mL premix  1,000 mg Intravenous Q12H Maryanna Shape Runyon, PHARMD   1,000 mg at 03/11/12 1610   Medications Prior to Admission  Medication Sig Dispense Refill  . levofloxacin (LEVAQUIN) 750 MG tablet Take 1 tablet (750 mg total) by mouth daily.  4 tablet  0    OB/GYN Status:  No LMP for  male patient.  General Assessment Data Location of Assessment: [facility] Assessment Services Living Arrangements: Family members Can pt return to current living arrangement?: Yes Admission Status: Voluntary Is patient capable of signing voluntary admission?: Yes Transfer from: Acute Hospital Referral Source: MD  Education Status Is patient currently in school?: No Highest grade of school patient has completed: graduated high school  Contact person: [name] [phone])  Risk to self Suicidal  Ideation: No-Not Currently/Within Last 6 Months Suicidal Intent: No-Not Currently/Within Last 6 Months Is patient at risk for suicide?: No Suicidal Plan?: No Access to Means: No What has been your use of drugs/alcohol within the last 12 months?: none Previous Attempts/Gestures: No Other Self Harm Risks: psychotic unable to function safely Triggers for Past Attempts: None known Intentional Self Injurious Behavior: None Family Suicide History: Unknown Recent stressful life event(s): Other (Comment) (psychotic) Persecutory voices/beliefs?: Yes Depression: No Depression Symptoms: Feeling angry/irritable Substance abuse history and/or treatment for substance abuse?: No Suicide prevention information given to non-admitted patients: Not applicable  Risk to Others Homicidal Ideation: No Thoughts of Harm to Others: No Current Homicidal Intent: No Current Homicidal Plan: No Access to Homicidal Means: No History of harm to others?: No Assessment of Violence: None Noted Does patient have access to weapons?: No Criminal Charges Pending?: No Does patient have a court date: No  Psychosis Hallucinations: Auditory;Visual (talking to self irrationally) Delusions: Grandiose;Persecutory (He has special mission. 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.)  Mental Status Report Appear/Hygiene: Other (Comment) (in hospital therefore clean.) Eye Contact: Fair Motor Activity:  Restlessness;Agitation Speech: Tangential Level of Consciousness: Irritable;Alert Mood: Suspicious;Irritable Affect: Blunted;Preoccupied Anxiety Level: Minimal Thought Processes: Circumstantial;Tangential Judgement: Impaired Orientation: Person;Place;Time Obsessive Compulsive Thoughts/Behaviors: None  Cognitive Functioning Concentration: Decreased Memory: Recent Intact;Remote Intact IQ: Average Insight: Fair Impulse Control: Poor Appetite: Fair Weight Loss: 0  Weight Gain: 0  Sleep: No Change Vegetative Symptoms: None  Prior Inpatient Therapy Prior Inpatient Therapy: Yes Prior Therapy Dates: 02/2012 Prior Therapy Facilty/Provider(s): [facility] Reason for Treatment: Schizophrenia  Prior Outpatient Therapy Prior Outpatient Therapy: Yes Reason for Treatment: Schizophrenia                     Additional Information 1:1 In Past 12 Months?: Yes CIRT Risk: No Elopement Risk: No Does patient have medical clearance?: Yes     Disposition:  Disposition Disposition of Patient: Inpatient treatment program Type of inpatient treatment program: Adult  On Site Evaluation by:   Reviewed with Physician:     Conan Bowens 03/12/2012 6:28 PM

## 2012-03-12 NOTE — Progress Notes (Signed)
Late Entry: Pt has been accepted to [facility]. Per RN, MD is in agreement. All support paper work had been completed and faxed to [facility]. No further CSW needs identified at this time.

## 2012-03-12 NOTE — Progress Notes (Signed)
Patient ID: David [name], male   DOB: [birth year], 56 y.o.   MRN: [id number] This is a 56 y.o. S/W/M vol. admission with a Dx of Schizophrenia, Paranoid Type. He had been at [facility] for acute psychosis and sent to [facility] where he was admitted to a medical floor for left lower lobe pneumonia and fever. Although the pneumonia is resolving and he is medically stable, he is still exhibiting psychotic behavior and was readmitted to [facility]. His thoughts are disorganized and tangential. Denies any A/V hallucinations, but is observed talking to himself. He lives with his brother. His mother continues to be a source of support. However, he refused to list anyone to notify in case of emergency. He is a poor historian and difficult to assess.

## 2012-03-12 NOTE — Consult Note (Signed)
Psy Interval Note:  reposrt he lives with his brother. thinks he is doing better. want to see dr, redling now. willing to go to behavioral hospital . could not tell why he was strafer red here. good sleep. eating well   Past Medical History  Diagnosis Date  . Schizophrenia   . Hypertension   . Pneumonia     Past Surgical History  Procedure Date  . Tonsillectomy     History reviewed. No pertinent family history.  Social History:  reports that he has been smoking Cigarettes.  He has a 60 pack-year smoking history. He has never used smokeless tobacco. He reports that he drinks alcohol. He reports that he uses illicit drugs (Cocaine and Marijuana).  Allergies:  Allergies  Allergen Reactions  . Lithium Other (See Comments)    "get's very sick"    Medications: I have reviewed the patient's current medications.  Results for orders placed during the hospital encounter of 03/09/12 (from the past 48 hour(s))  CBC     Status: Abnormal   Collection Time   03/10/12  4:40 AM      Component Value Range Comment   WBC 20.7 (*) 4.0 - 10.5 (K/uL)    RBC 3.41 (*) 4.22 - 5.81 (MIL/uL)    Hemoglobin 9.1 (*) 13.0 - 17.0 (g/dL)    HCT 16.1 (*) 09.6 - 52.0 (%)    MCV 83.3  78.0 - 100.0 (fL)    MCH 26.7  26.0 - 34.0 (pg)    MCHC 32.0  30.0 - 36.0 (g/dL)    RDW 04.5 (*) 40.9 - 15.5 (%)    Platelets 115 (*) 150 - 400 (K/uL)   BASIC METABOLIC PANEL     Status: Abnormal   Collection Time   03/10/12  4:40 AM      Component Value Range Comment   Sodium 136  135 - 145 (mEq/L)    Potassium 3.8  3.5 - 5.1 (mEq/L)    Chloride 102  96 - 112 (mEq/L)    CO2 30  19 - 32 (mEq/L)    Glucose, Bld 95  70 - 99 (mg/dL)    BUN 17  6 - 23 (mg/dL)    Creatinine, Ser 8.11 (*) 0.50 - 1.35 (mg/dL)    Calcium 8.4  8.4 - 10.5 (mg/dL)    GFR calc non Af Amer 50 (*) >90 (mL/min)    GFR calc Af Amer 58 (*) >90 (mL/min)   MRSA PCR SCREENING     Status: Normal   Collection Time   03/10/12 11:05 PM      Component  Value Range Comment   MRSA by PCR NEGATIVE  NEGATIVE    VANCOMYCIN, TROUGH     Status: Abnormal   Collection Time   03/11/12 10:30 AM      Component Value Range Comment   Vancomycin Tr 55.2 (*) 10.0 - 20.0 (ug/mL)   CBC     Status: Abnormal   Collection Time   03/11/12 10:30 AM      Component Value Range Comment   WBC 16.5 (*) 4.0 - 10.5 (K/uL)    RBC 3.46 (*) 4.22 - 5.81 (MIL/uL)    Hemoglobin 9.4 (*) 13.0 - 17.0 (g/dL)    HCT 91.4 (*) 78.2 - 52.0 (%)    MCV 82.7  78.0 - 100.0 (fL)    MCH 27.2  26.0 - 34.0 (pg)    MCHC 32.9  30.0 - 36.0 (g/dL)    RDW 95.6 (*) 21.3 -  15.5 (%)    Platelets 85 (*) 150 - 400 (K/uL)   DIFFERENTIAL     Status: Abnormal   Collection Time   03/11/12 10:30 AM      Component Value Range Comment   Neutrophils Relative 76  43 - 77 (%)    Neutro Abs 12.5 (*) 1.7 - 7.7 (K/uL)    Lymphocytes Relative 8 (*) 12 - 46 (%)    Lymphs Abs 1.2  0.7 - 4.0 (K/uL)    Monocytes Relative 10  3 - 12 (%)    Monocytes Absolute 1.6 (*) 0.1 - 1.0 (K/uL)    Eosinophils Relative 7 (*) 0 - 5 (%)    Eosinophils Absolute 1.1 (*) 0.0 - 0.7 (K/uL)    Basophils Relative 0  0 - 1 (%)    Basophils Absolute 0.1  0.0 - 0.1 (K/uL)    WBC Morphology PLATELET COUNT CONFIRMED BY SMEAR   LARGE PLATELETS PRESENT  CREATININE, SERUM     Status: Abnormal   Collection Time   03/11/12 10:30 AM      Component Value Range Comment   Creatinine, Ser 1.56 (*) 0.50 - 1.35 (mg/dL)    GFR calc non Af Amer 49 (*) >90 (mL/min)    GFR calc Af Amer 56 (*) >90 (mL/min)     No results found.  Review of Systems  Neurological: Negative for headaches.   Blood pressure 145/66, pulse 98, temperature 98.9 F (37.2 C), temperature source Oral, resp. rate 22, height 6\' 1"  (1.854 m), weight 80.74 kg (178 lb), SpO2 96.00%. Physical Exam  Awake and oriented to person and time but not place. Appearance is disshleved. On bed.. No signs of tics or EPS. Behavior is cooperative,calm, appropriate.Speech is normal.  Mood is good. Affect flat Thought process is circumstantial. Thought content includes no SI now. no avh now.There are no signs of perceptual impairment. Associations are intact. Immediate, recent and remote memory poor. Approximate intelligence is average. Insight and judgment are poor. Assessment/  AXIS I  schizophrenia paranoid type  AXIS II  Deffered  AXIS III  See medical notes.  AXIS IV  problems related to social environment and problems with primary support group  AXIS V  31-40 impairment in reality testing    Plan:  Will continue to follow  - recommend inpatient psy once medically stable  Kloe Oates 03/12/2012, 2:28 AM

## 2012-03-12 NOTE — Progress Notes (Signed)
Per Care Coordinator, Clarissa, Pt has been cleared to return to [facility].  Notified psych MD.  Psych MD to Ax Pt.  Notified [facility].  [facility] to assess bed status and contact CSW if a bed is available today.  Providence Crosby, LCSWA Clinical Social Work [phone]

## 2012-03-13 DIAGNOSIS — F200 Paranoid schizophrenia: Principal | ICD-10-CM

## 2012-03-13 MED ORDER — CARBAMAZEPINE 200 MG PO TABS
200.0000 mg | ORAL_TABLET | ORAL | Status: DC
  Administered 2012-03-13 – 2012-03-17 (×8): 200 mg via ORAL
  Filled 2012-03-13: qty 1
  Filled 2012-03-13: qty 4
  Filled 2012-03-13 (×7): qty 1
  Filled 2012-03-13: qty 4
  Filled 2012-03-13 (×2): qty 1

## 2012-03-13 MED ORDER — CHLORDIAZEPOXIDE HCL 25 MG PO CAPS
25.0000 mg | ORAL_CAPSULE | ORAL | Status: DC
  Administered 2012-03-13 – 2012-03-17 (×12): 25 mg via ORAL
  Filled 2012-03-13 (×8): qty 1
  Filled 2012-03-13: qty 6
  Filled 2012-03-13 (×5): qty 1

## 2012-03-13 MED ORDER — BENZTROPINE MESYLATE 1 MG PO TABS
1.0000 mg | ORAL_TABLET | ORAL | Status: DC
  Administered 2012-03-14 – 2012-03-17 (×7): 1 mg via ORAL
  Filled 2012-03-13 (×6): qty 1
  Filled 2012-03-13 (×2): qty 4
  Filled 2012-03-13 (×4): qty 1

## 2012-03-13 MED ORDER — PANTOPRAZOLE SODIUM 40 MG PO TBEC
40.0000 mg | DELAYED_RELEASE_TABLET | Freq: Every day | ORAL | Status: DC
  Administered 2012-03-14 – 2012-03-16 (×3): 40 mg via ORAL
  Filled 2012-03-13 (×7): qty 1

## 2012-03-13 MED ORDER — ALBUTEROL SULFATE (2.5 MG/3ML) 0.083% IN NEBU
2.5000 mg | INHALATION_SOLUTION | RESPIRATORY_TRACT | Status: DC
Start: 2012-03-13 — End: 2012-03-17
  Administered 2012-03-13 – 2012-03-17 (×10): 2.5 mg via RESPIRATORY_TRACT
  Filled 2012-03-13: qty 0.5
  Filled 2012-03-13: qty 3
  Filled 2012-03-13: qty 0.5
  Filled 2012-03-13 (×3): qty 3
  Filled 2012-03-13: qty 0.5
  Filled 2012-03-13 (×6): qty 3
  Filled 2012-03-13 (×3): qty 0.5
  Filled 2012-03-13 (×2): qty 3

## 2012-03-13 MED ORDER — HALOPERIDOL 5 MG PO TABS
10.0000 mg | ORAL_TABLET | ORAL | Status: DC
  Administered 2012-03-13 – 2012-03-17 (×8): 10 mg via ORAL
  Filled 2012-03-13 (×3): qty 2
  Filled 2012-03-13: qty 8
  Filled 2012-03-13 (×6): qty 2
  Filled 2012-03-13: qty 8
  Filled 2012-03-13: qty 2
  Filled 2012-03-13: qty 8

## 2012-03-13 MED ORDER — IPRATROPIUM BROMIDE 0.02 % IN SOLN
0.5000 mg | RESPIRATORY_TRACT | Status: DC
  Administered 2012-03-13 – 2012-03-17 (×13): 0.5 mg via RESPIRATORY_TRACT
  Filled 2012-03-13 (×18): qty 2.5

## 2012-03-13 NOTE — Discharge Planning (Addendum)
Met with patient in Aftercare Planning Group.   He was initially pleasant and willing to talk about his physical health, made sense and was goal-directed.  He escalated however into irritable speech about money owed to him, and medicines he wants to take.  When Case Manager asked about calling his brother to check on whether the family is ready to take him home, he became very irritable about not bothering brother at work, as he has done in the past.  Case Manager did call toward end of day and left message for brother's family asking for visit/call and then contact with counselor re patient's return to baseline.  Per State Regulation 482.30  This chart was reviewed for medical necessity with respect to the patient's Admission/Duration of stay.   Next review due:  03/16/12   Ambrose Mantle, LCSW 03/13/2012, 4:10 PM

## 2012-03-13 NOTE — Progress Notes (Signed)
Pt's affect is anxious and wide eyed. Pt denies si and when asked about hi thoughts he nodded his head yes and then started talking about his brother. Pt talks from one subject to another. Pt's pulse was elevated. Rechecked pulse. No symptoms of distress. Offered support and 15 minute checks. Safety maintained on unit.

## 2012-03-13 NOTE — Progress Notes (Signed)
Patient ID: David [name], male   DOB: Aug 09, 1956, 57 y.o.   MRN: [id number] Pt is awake in bed this afternoon. Pt mood is depressed and affect is anxious. Pt denies SI/HI and AVH. Pt states that he is isolating in his room in order to protect others from his pneumonia. Writer encouraged pt to participate in milieu. Pt did attend karaoke. Pt is still obsessed with people owing him money and has delusions regarding the circumstances. Pt is pleasant and cooperative with staff. Writer will continue to monitor.

## 2012-03-13 NOTE — Tx Team (Signed)
Interdisciplinary Treatment Plan Update (Adult)  Date:  03/13/2012  Time Reviewed:  10:15AM-11:00AM  Progress in Treatment: Attending groups:  Yes Participating in groups:    Yes Taking medication as prescribed:    Yes Tolerating medication:   Yes Family/Significant other contact made:  Yes, but needed again with brother Patient understands diagnosis:   Yes, to some extent Discussing patient identified problems/goals with staff:   Yes, to some extent Medical problems stabilized or resolved:   Yes, we believe patient's pneumonia is resolved Denies suicidal/homicidal ideation:  Yes Issues/concerns per patient self-inventory:   None Other:    New problem(s) identified: No, Describe:    Reason for Continuation of Hospitalization: Medication stabilization Other; describe Need family to be ready to accept him home  Interventions implemented related to continuation of hospitalization:  Medication monitoring and adjustment, safety checks Q15 min., suicide risk assessment, group therapy, psychoeducation, collateral contact, aftercare planning, ongoing physician assessments, medication education  Additional comments:  Not applicable  Estimated length of stay:  1-3 days  Discharge Plan:  Return to the home with brother & family, follow up with [facility]  New goal(s):  Not applicable  Review of initial/current patient goals per problem list:   1.  Goal(s):  Eliminate aggressive behaviors  Met:  Yes  Target date:  By Discharge   As evidenced by:  No aggression shown in some time  2.  Goal(s):  Reduce psychotic symptoms (hallucinations) to baseline.  Met:  No  Target date:  By Discharge   As evidenced by:  We believe might be at baseline, although remains delusional, need family to say so.  3.  Goal(s):  Reduce paranoia to baseline.  Met:  No  Target date:  By Discharge   As evidenced by:  We believe might be at baseline, although remains irritable, need family to say  so.  4.  Goal(s):  Deny SI/HI for 48 hours prior to D/C.  Met:  Yes  Target date:  By Discharge   As evidenced by:  No SI/HI for some time  Attendees: Patient:  David [name]  03/13/2012 10:15AM-11:00AM  Family:     Physician:  Dr. Harvie Heck Readling 03/13/2012 10:15AM-11:00AM  Nursing:   Edwyna Shell, RN 03/13/2012 10:15AM -11:00AM   Case Manager:  Ambrose Mantle, LCSW 03/13/2012 10:15AM-11:00AM  Counselor:     Other:      Other:      Other:      Other:       Scribe for Treatment Team:   Sarina Ser, 03/13/2012, 10:15AM-11:15AM
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
## 2012-03-13 NOTE — [facility] Suicide Risk Assessment (Signed)
Suicide Risk Assessment  Admission Assessment     Demographic factors:  Assessment Details Time of Assessment: Admission Information Obtained From: Patient Current Mental Status:    Loss Factors:  Loss Factors: Financial problems / change in socioeconomic status Historical Factors:    Risk Reduction Factors:  Risk Reduction Factors: Living with another person, especially a relative  CLINICAL FACTORS:   Schizophrenia:   Paranoid or undifferentiated type Previous Psychiatric Diagnoses and Treatments Medical Diagnoses and Treatments/Surgeries  COGNITIVE FEATURES THAT CONTRIBUTE TO RISK:  Thought constriction (tunnel vision)    Diagnosis:  Axis I: Schizophrenia - Paranoid Type.   The patient was seen today and reports the following:   ADL's: Intact.  Sleep: The patient reports to continuing to have significant difficulty initiating sleep.  Appetite: The patient reports a decreased appetite today.   Mild>(1-10) >Severe  Hopelessness (1-10): 3  Depression (1-10): 4  Anxiety (1-10): 3   Suicidal Ideation: The patient denies any suicidal ideation today.  Plan: No  Intent: No  Means: No   Homicidal Ideation: The patient denies any homicidal ideations today.  Plan: No  Intent: No.  Means: No   General Appearance/Behavior:  Appropriate with mild irritability.  Eye Contact: Fair.  Speech: Appropriate in rate and volume today with no pressuring noted.  Motor Behavior: wnl.  Level of Consciousness: Alert and Oriented x 3.  Mental Status: Alert and Oriented x 3.  Mood: Mildly depressed.  Affect: Mildly Constricted.  Anxiety Level: Mild anxiety reported.  Thought Process: Residual paranoid delusions continue but improved.  Thought Content: The patient denies any auditory or visual hallucinations today.  He continues to report some paranoid delusions as well as the delusion that he is owed money by [facility].  Perception:. Delusional.  Judgment: Poor.  Insight: Poor.    Cognition: Oriented to place and person.   Lab Results: No results found for this or any previous visit (from the past 48 hour(s)).   The patient was seen today after being readmitted to behavioral health from the medicine service where he has been transferred twice for treatment of pneumonia.  The patient states that he is "breathing a little better."  He reports to having ongoing problems with sleep and reports some mild depressive symptoms. He states that [facility] owes him money and he will be ready for discharge "one I have the money in my hand."  The Case Manager will attempt to contact the patient's brother to help gain information about the patient's baseline symptoms and if he has reached maximum hospital benefit.  Treatment Plan Summary:  1. Daily contact with patient to assess and evaluate symptoms and progress in treatment  2. Medication management  3. The patient will deny suicidal ideations or homicidal ideations for 48 hours prior to discharge and have a depression and anxiety rating of 3 or less. The patient will also deny any auditory or visual hallucinations or delusional thinking.   Plan:  1. Will continue current medications.  2. Laboratory studies reviewed.  3. Will continue to monitor.   SUICIDE RISK:   Minimal: No identifiable suicidal ideation.  Patients presenting with no risk factors but with morbid ruminations; may be classified as minimal risk based on the severity of the depressive symptoms.  Maudine Kluesner 03/13/2012, 11:21 AM

## 2012-03-14 NOTE — Progress Notes (Signed)
Pt's affect is bizarre and blunted. He denies si and shakes his head yes when asked about hi thoughts. When asked about hallucinations pt states that he does not want to talk about it. He is cautious when taking his medication and states that he only takes Librium at home. Offered support and encouragement. Safety maintained on unit.

## 2012-03-14 NOTE — Discharge Summary (Signed)
Physician Discharge Summary Note  Patient:  David [name] is an 56 y.o., male MRN:  [id number] DOB:  Sep 23, 1956 Patient phone:  [phone] (home)  Patient address:   [address] [place] [address],   Date of Admission:  03/06/2012 Date of Discharge: 03/09/2012  Discharge Diagnoses: Principal Problem:  *Schizophrenia, paranoid, chronic   Axis Diagnosis:   AXIS I:  Schizophrenia, Paranoid Type, chronic AXIS II:  No diagnosis AXIS [name]:  Pneumonia, exacerbation Past Medical History  Diagnosis Date  . Schizophrenia   . Hypertension   . Pneumonia   . Anxiety    AXIS IV:  Good family support is an asset AXIS V:  51-60 moderate symptoms  Level of Care:  OP  Hospital Course:  This was a brief admission for [facility], who is readmitted for psychiatric unit after several day stay on our internal medical unit, for treatment of pneumonia. He had returned for unit on March 7. Been responding well to Tegretol and Haldol, with no signs of EPS. On Saturday night the 10th, his nurse on the unit found me, and reported that he become nauseous, was vomiting, and tachycardic. He was transferred to [facility] long emergency room and subsequently readmitted to the medical service  Consults:  None  Significant Diagnostic Studies:  None  Discharge Vitals:   Blood pressure 145/67, pulse 113, temperature 100.2 F (37.9 C), temperature source Oral, resp. rate 34, height 6' (1.829 m), weight 80.74 kg (178 lb), SpO2 96.00%.  Mental Status Exam: See Mental Status Examination and Suicide Risk Assessment completed by Attending Physician prior to discharge.  Discharge destination:  [facility]  Is patient on multiple antipsychotic therapies at discharge:  No   Has Patient had three or more failed trials of antipsychotic monotherapy by history:  No  Recommended Plan for Multiple Antipsychotic Therapies: N/A   Medication List  As of 03/14/2012  3:44 PM   ASK your doctor about these  medications      Indication    amLODipine 5 MG tablet   Commonly known as: NORVASC   Take 1 tablet (5 mg total) by mouth daily.   For Hypertension.    benztropine 1 MG tablet   Commonly known as: COGENTIN   Take 1 tablet (1 mg total) by mouth 3 (three) times daily.   For EPS.    carbamazepine 200 MG tablet   Commonly known as: TEGRETOL   Take 1 tablet (200 mg total) by mouth 2 (two) times daily.   As a Mood Stabilizer.    chlordiazePOXIDE 25 MG capsule   Commonly known as: LIBRIUM   Take 1 capsule (25 mg total) by mouth 3 (three) times daily.   For Anxiety.    folic acid 1 MG tablet   Commonly known as: FOLVITE   Take 1 tablet (1 mg total) by mouth daily.   As a Nutritional Supplement.    haloperidol 10 MG tablet   Commonly known as: HALDOL   Take 1 tablet (10 mg total) by mouth 2 (two) times daily at 8am and 2pm.   For Psychosis.    haloperidol 20 MG tablet   Commonly known as: HALDOL   Take 1 tablet (20 mg total) by mouth at bedtime.   For Psychosis.    levofloxacin 750 MG tablet   Commonly known as: LEVAQUIN   Take 1 tablet (750 mg total) by mouth daily. Last dose March 12.   For Pneumonia.    nicotine 21 mg/24hr patch  Commonly known as: NICODERM CQ - dosed in mg/24 hours   Place 1 patch onto the skin daily at 6 (six) AM.   For Nicotine Replacement.    pantoprazole 40 MG tablet   Commonly known as: PROTONIX   Take 40 mg by mouth daily.   For GERD.    thiamine 100 MG tablet   Take 1 tablet (100 mg total) by mouth daily.   For Nutritional Supplementation.    traZODone 150 MG tablet   Commonly known as: DESYREL   Take 1 tablet (150 mg total) by mouth at bedtime.   For Sleep.    zolpidem 5 MG tablet   Commonly known as: AMBIEN   Take 1 tablet (5 mg total) by mouth at bedtime as needed for sleep (insomnia).   For Sleep.         Follow-up Information    Follow-up recommendations:  Activity:  unrestricted Diet:  regular  Signed: SCOTT,MARGARET  A 03/14/2012, 3:44 PM

## 2012-03-14 NOTE — Progress Notes (Signed)
Patient ID: David [name], male   DOB: Nov 06, 1956, 56 y.o.   MRN: [id number] David [name] is fully alert, slightly irritable affect, mostly irritable because he wants to go home. He is asking to leave today. Says he doesn't need anything else, but is feeling much better. Says his breathing is much better no chest pain or coughing. He denies fever and chills.  He reports he is taking his medicines as prescribed. He is able to answer all my questions logically. Denies any suicidal thoughts, denies any depression. He then says that he doesn't hospital is suspicious of him because he was busy inspecting them. And as soon as he began inspecting the hospital the staff began putting things away. He is referring to his stay over a [name] but he refers to as "over there".  No evidence of dangerous ideas. His thinking is very logically other than he does have some fixed delusions. No evidence of agitation, he is not aggressive. Sleep is good, appetite good.  Assessment: Schizophrenia, paranoid type, chronic. I feel that his current presentation, but somewhat anxious, is close to his baseline. He has no dangerous ideas and I feel that the environment here may in fact be making him more anxious and exacerbating his delusional thinking.  Plan: We will request the family visit him and consider taking him home.

## 2012-03-14 NOTE — Progress Notes (Signed)
Pt has been up and has been visible in milieu today, pt has made various comments about being ready to leave, also made various comments about needing someone to give him his money that he was owed, this theme was recurrent throughout the evening, pt did receive all medications without incident, support provided, will continue to monitor

## 2012-03-14 NOTE — Progress Notes (Signed)
SW met with pt on this date.  Pt presents with agitated mood and affect.  Pt states he doesn't know what info SW wants from him.  SW explained that SW was just checking on how he's doing today.  Pt loudly said "I want to discharge today and go home!!"  SW asked pt where home is and he states that he lives in [place] with his brother and would follow up at [facility].  Pt then began explaining to SW that he is only in this hospital to inspect it.  Pt states that he and his boss man decided that this hospital building needed to be inspected and has decided it is a mess and needs to come down in 2-3 years.  Pt states that he was here a few years ago and the hospital was in worse condition and since that inspection it has been patched up for now.  Pt states that this is the only reason he is a patient here so he is ready to d/c now.    Pt will follow up with Arna Medici for medication management and therapy.  No further needs voiced at this time by pt.   Daeshon Grammatico Horton, LCSWA 03/14/2012  1:25 PM

## 2012-03-14 NOTE — Progress Notes (Signed)
Patient resting quietly with eyes closed. Respirations even and unlabored. No distress noted. Q 15 minute check continues to maintain safety   

## 2012-03-14 NOTE — Progress Notes (Signed)
[facility] Group Notes:  (Counselor/Nursing/MHT/Case Management/Adjunct)  03/14/2012 3:05 PM  Type of Therapy:  Group Therapy  Participation Level:  Minimal  Participation Quality:  Attentive and Sharing  Affect:  Irritable  Cognitive:  Confused  Insight:  None  Engagement in Group:  Limited  Engagement in Therapy:  Limited  Modes of Intervention:  Support and Exploration  Summary of Progress/Problems: patient stated that he is ready to leave here. 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 Continues to insist that he leave. Other conversation was logical.   Veto Kemps 03/14/2012, 3:05 PM

## 2012-03-15 LAB — CULTURE, BLOOD (ROUTINE X 2): Culture: NO GROWTH

## 2012-03-15 NOTE — Progress Notes (Signed)
Patient ID: David [name], male   DOB: June 07, 1956, 56 y.o.   MRN: [id number]   Wanted to see Dr. Allena Katz today and reported that he has missed his appointment with him today. Sleep good, eating well. Was seen walking in the hallway. Thinks he may be ready to go home. Note sure whether his brother is coming to visit him this weekend. Note sure whey he was sent to medical floor and appeard not happy with the staff there but unable to express his concerns clearly and in organized way.  No evidence of dangerous ideas. His thinking is logically and at times circumstantial. he does have some fixed delusions. No evidence of agitation, he is not aggressive. Sleep is good, appetite good.  Assessment:   Schizophrenia, paranoid type, chronic.   Plan:  We will request the family visit him and consider taking him home as he appears to be at his baseline.

## 2012-03-15 NOTE — Progress Notes (Signed)
Patient ID: David [name], male   DOB: Apr 09, 1956, 56 y.o.   MRN: [id number]  Pt. attended and participated in aftercare planning group. Pt denied S/I and H/I. Pt. Appeared to be feeling good today. Pt stated that he is planning to be D/C Monday to be picked up by his brother. Pt will be going back home to live with his brother. Pt said in group that he will be seeing Ballard Russell on Monday, but he is scheduled to f/u at [facility] even though the appointment has not been scheduled yet.

## 2012-03-15 NOTE — Progress Notes (Signed)
Writer observed patient in the hallway talking to the MHTs. Writer introduced self to patient and asked how his day had been. Patient reported his day was good and made comment, "I'm still here." Writer informed patient of his medications and patient reported that he did not want his cogentin tonight. Writer encouraged patient to think about it and let me know colser to time for medications. Patient currently denies having pain, -si/hi/a/v hall. Safety maintained on unit, will continue to monitor.

## 2012-03-15 NOTE — Progress Notes (Signed)
Patient ID: David [name], male   DOB: May 23, 1956, 55 y.o.   MRN: [id number]   [facility] Group Notes:  (Counselor/Nursing/MHT/Case Management/Adjunct)  03/15/2012 11 AM  Type of Therapy:  Group Therapy, Dance/Movement Therapy   Participation Level:  Active  Participation Quality:  Appropriate  Affect:  Appropriate  Cognitive:  Oriented  Insight:  Limited  Engagement in Group:  Good  Engagement in Therapy:  Good  Modes of Intervention:  Clarification, Problem-solving, Role-play, Socialization and Support  Summary of Progress/Problems:  Group practiced healthy interpersonal and intrapersonal communication through movement. Pt fully participated in the group process. He showed clarity with his intended movements but he lacked solid control with the follow through. This indicates that he ready to put his recovery plan in action, but pt has difficulty with maintaining focus.    Thomasena Edis, [employer]

## 2012-03-15 NOTE — Progress Notes (Addendum)
Pt is depressed and flat   He came to get medications this morning and said all he wanted was the librium  But after much discussion he did agree to take all of his medications  Pt reported he feels some better but still feels weak from being sick with pneumonia last week  Pt reports his appetite is good but not sleeping well    He said he is ready to be discharged   He is still delusional saying the only reason he came into the hospital was to inspect the buildings and then he wound up in here  Pt took his breathing treatment and has been resting in his bed  He has minimal interaction with others and can be intrusive at times   Verbal support given  Medications administered and effectiveness monitored  Educated on medications and theraputic use   Q 15 min checks  Pt safe at present

## 2012-03-16 NOTE — Progress Notes (Signed)
Returned telephone call from pt.'s mother David [name] (h) [phone] (c) [phone]. Mother asked if pt was being D/C tomorrow Monday 03/17/12. Therapist explained that this is a possibility but has not been confirmed as of this time. Pt.'s mother stated that on Thursday she spoke to the pt and he was "in reality but slipping a lot" then and that she still feels he is overly concerned with the worries of the world and the struggles he is seeing on TV. She agreed to call the pt today and check in with him. Pt was advised that he would need to call and give his mother his code so this can occur. Pt.'s mother stated that the pt will be going to live with his brother who he has been living with for over two years and tha the has the next few days off and can provide transportation home. Pt.'s mother would like to be called tomorrow by a staff person once D/C is decided. Pt.'s mother stated that the pt had called her and that she was unsure if she could trust the pt.'s judgement on the D/C day and information.

## 2012-03-16 NOTE — Progress Notes (Signed)
Patient ID: Minna Merritts III, male   DOB: [birth year], 56 y.o.   MRN: [id number]   [facility] Group Notes:  (Counselor/Nursing/MHT/Case Management/Adjunct)  03/16/2012 11 AM  Type of Therapy:  Group Therapy, Dance/Movement Therapy   Participation Level:  Active  Participation Quality:  Appropriate  Affect:  Labile  Cognitive:  Confused  Insight:  Limited  Engagement in Group:  Good  Engagement in Therapy:  Good  Modes of Intervention:  Clarification, Problem-solving, Role-play, Socialization and Support  Summary of Progress/Problems:  Group focused on healthy sources of support as well as healthy ways to maintain positive support for self in recovery. Pt named his "old,old new girlfriend" as a healthy support in his life. He shared that playing tennis puts a smile on his face. Pt would constant change positions sitting in his chair. He showed moments of clarity in his speech, but then would shift to grandiose thinking. This suggests that pt's mental state is not stable enough to successfully put recovery plan into action.    Thomasena Edis, [employer]

## 2012-03-16 NOTE — Progress Notes (Signed)
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

## 2012-03-16 NOTE — Progress Notes (Signed)
Patient ID: David [name], male   DOB: December 21, 1956, 56 y.o.   MRN: [id number]  Pt. attended and participated in aftercare planning group. Pt denied S/I, but when asked about H/I, pt said that he could not explain. Pt. Shared that he was feeling "groggy." Pt listed their current anxiety level as 4 and his depression at a 4. Pt is still planning to be D/C tomorrow.

## 2012-03-16 NOTE — Progress Notes (Signed)
Patient ID: David [name], male   DOB: Aug 20, 1956, 56 y.o.   MRN: [id number]   Thinks he is very happy and upbeat today and not sure why. Not sure if his family visited him yesterday. Sleep good, eating well. Was seen walking in the hallway. Thinks he is ready to go home. Note sure whether his brother is coming to visit him this weekend.  No evidence of dangerous ideas. His thinking is logically and at times circumstantial. he does have some fixed delusions. No evidence of agitation, he is not aggressive. Sleep is good, appetite good.  Assessment:   Schizophrenia, paranoid type, chronic.   Plan:  will request the family visit him and consider taking him home as he appears to be at his baseline.

## 2012-03-17 MED ORDER — CARBAMAZEPINE 200 MG PO TABS: 200.0000 mg | ORAL_TABLET | Freq: Two times a day (BID) | ORAL | Status: DC

## 2012-03-17 MED ORDER — TRAZODONE HCL 150 MG PO TABS: 150.0000 mg | ORAL_TABLET | Freq: Every day | ORAL | Status: DC

## 2012-03-17 MED ORDER — HALOPERIDOL DECANOATE 100 MG/ML IM SOLN: INTRAMUSCULAR | Status: DC

## 2012-03-17 MED ORDER — PANTOPRAZOLE SODIUM 40 MG PO TBEC
40.0000 mg | DELAYED_RELEASE_TABLET | Freq: Every day | ORAL | Status: DC
  Filled 2012-03-17 (×2): qty 1

## 2012-03-17 MED ORDER — CHLORDIAZEPOXIDE HCL 25 MG PO CAPS: ORAL_CAPSULE | ORAL | Status: DC

## 2012-03-17 MED ORDER — HALOPERIDOL DECANOATE 100 MG/ML IM SOLN: 100.0000 mg | INTRAMUSCULAR | Status: DC

## 2012-03-17 MED ORDER — BENZTROPINE MESYLATE 1 MG PO TABS: 1.0000 mg | ORAL_TABLET | ORAL | Status: DC

## 2012-03-17 MED ORDER — FOLIC ACID 1 MG PO TABS: 1.0000 mg | ORAL_TABLET | Freq: Every day | ORAL | Status: AC

## 2012-03-17 MED ORDER — THIAMINE HCL 100 MG PO TABS: 100.0000 mg | ORAL_TABLET | Freq: Every day | ORAL | Status: AC

## 2012-03-17 MED ORDER — ALBUTEROL SULFATE (5 MG/ML) 0.5% IN NEBU
2.5000 mg | INHALATION_SOLUTION | RESPIRATORY_TRACT | Status: DC
  Administered 2012-03-17: 2.5 mg via RESPIRATORY_TRACT
  Filled 2012-03-17 (×6): qty 0.5

## 2012-03-17 MED ORDER — AMLODIPINE BESYLATE 5 MG PO TABS: 5.0000 mg | ORAL_TABLET | Freq: Every day | ORAL | Status: DC

## 2012-03-17 MED ORDER — HALOPERIDOL 10 MG PO TABS: 10.0000 mg | ORAL_TABLET | ORAL | Status: DC

## 2012-03-17 MED ORDER — PANTOPRAZOLE SODIUM 40 MG PO TBEC: 40.0000 mg | DELAYED_RELEASE_TABLET | Freq: Every day | ORAL | Status: DC

## 2012-03-17 NOTE — Tx Team (Signed)
Interdisciplinary Treatment Plan Update (Adult)  Date:  03/17/2012  Time Reviewed:  10:15AM-11:00AM  Progress in Treatment: Attending groups:  Yes Participating in groups:    Yes Taking medication as prescribed:    Yes Tolerating medication:   Yes Family/Significant other contact made:  Yes, another contact with Brother/Mother needed to confirm patient is ready for discharge Patient understands diagnosis:   Yes, limited insight Discussing patient identified problems/goals with staff:   Yes Medical problems stabilized or resolved:   Yes, breathing is much better, and feels breathing treatments are helping Denies suicidal/homicidal ideation:  Yes Issues/concerns per patient self-inventory:  None  Other:    New problem(s) identified: No, Describe:    Reason for Continuation of Hospitalization: None  Interventions implemented related to continuation of hospitalization:  Medication monitoring and adjustment, safety checks Q15 min., suicide risk assessment, group therapy, psychoeducation, collateral contact, aftercare planning, ongoing physician assessments, medication education  Additional comments:  Not applicable  Estimated length of stay:  Discharge today  Discharge Plan:  Go home with brother, follow up with [facility] in [facility]):  Not applicable  Review of initial/current patient goals per problem list:   1.  Goal(s):  Eliminate aggressive  behaviors  Met:  Yes  Target date:  By Discharge   As evidenced by:  No aggressive behaviors noted in this hospitalization  2.  Goal(s):  Reduce psychotic symptoms (hallucinations) to baseline.  Met:  Yes  Target date:  By Discharge   As evidenced by:  Appears to be at baseline, we will contact mother to confirm  3.  Goal(s):  Reduce paranoia to baseline.  Met:  Yes  Target date:  By Discharge   As evidenced by:  Lacks paranoid symptoms  4.  Goal(s):  Deny SI/HI for 48 hours prior to D/C.  Met:  Yes  Target  date:  By Discharge   As evidenced by:  Denies adamantly  Attendees: Patient:  David [name]  03/17/2012 10:15AM-11:00AM  Family:     Physician:  Dr. Harvie Heck Readling 03/17/2012 10:15AM-11:00AM  Nursing:   Waynetta Sandy, RN 03/17/2012 10:15AM -11:00AM   Case Manager:  Ambrose Mantle, LCSW 03/17/2012 10:15AM-11:00AM  Counselor:  Veto Kemps, MT-BC 03/17/2012 10:15AM-11:00AM  Other:   Lynann Bologna, NP 03/17/2012 10:15AM-11:00AM  Other:   Osborn Coho, LCSW-A 03/17/2012 10:15AM-11:00AM  Other:      Other:       Scribe for Treatment Team:   Sarina Ser, 03/17/2012, 10:15AM-11:15AM

## 2012-03-17 NOTE — Progress Notes (Signed)
Patient ID: David [name], male   DOB: 06-29-1956, 56 y.o.   MRN: [id number] Pt denies SI/HI/AVH.  Discharge instructions given to the patient and his family with explanations of appointment times, when to take his next medicines, crisis numbers.  Pt's belongings returned.  Pt and family escorted to the lobby.

## 2012-03-17 NOTE — Progress Notes (Signed)
[facility] Case Management Discharge Plan:  Will you be returning to the same living situation after discharge: Yes,  with brother At discharge, do you have transportation home?:Yes,  family Do you have the ability to pay for your medications:Yes,  insurance  Interagency Information:     Release of information consent forms completed and in the chart;  Patient's signature needed at discharge.  Patient to Follow up at:  Follow-up Information    Follow up with [facility] on 03/19/2012. (8AM appointment for Hospital Discharge - Auth [id number])    Contact information:   [address] [place] Telephone:  [phone]         Patient denies SI/HI:   No.    Safety Planning and Suicide Prevention discussed:  Yes,  During Aftercare Planning Group, Case Manager provided psychoeducation on "Suicide Prevention Information."  This included descriptions of risk factors for suicide, warning signs that an individual is in crisis and thinking of suicide, and what to do if this occurs.  Pt indicated understanding of information provided, and will read brochure given upon discharge.     Barrier to discharge identified:No.  Summary and Recommendations:  Follow up with [facility].   Sarina Ser 03/17/2012, 3:48 PM

## 2012-03-17 NOTE — [facility] Suicide Risk Assessment (Signed)
Suicide Risk Assessment  Discharge Assessment     Demographic factors:  Divorced or widowed;Caucasian  Current Mental Status Per Physician:  Loss Factors: Financial problems / change in socioeconomic status  Continued Clinical Symptoms:  Schizophrenia:   Paranoid or undifferentiated type  Discharge Diagnoses:   AXIS I:  Schizophrenia - Paranoid Type. AXIS II:  None. AXIS III:   1. Hypertension.   2. COPD. AXIS IV:  Chronic Mental Illness. AXIS V:  GAF at time of Admission approximately 25.  GAF at time of discharge approximately 45.  Diagnosis:  Axis I: Schizophrenia - Paranoid Type.   The patient was seen today and reports the following:   ADL's: Intact.  Sleep: The patient reports to sleeping well last night without difficulty now that he is "breathing well." Appetite: The patient reports a good appetite today.   Mild>(1-10) >Severe  Hopelessness (1-10): 0  Depression (1-10): 0  Anxiety (1-10): 2   Suicidal Ideation: The patient denies any suicidal ideation today.  Plan: No  Intent: No  Means: No   Homicidal Ideation: The patient denies any homicidal ideations today.  Plan: No  Intent: No.  Means: No   General Appearance/Behavior: Appropriate and cooperative today.  Eye Contact: Good.  Speech: Appropriate in rate and volume today with no pressuring noted.  Motor Behavior: wnl.  Level of Consciousness: Alert and Oriented x 3.  Mental Status: Alert and Oriented x 3.  Mood: Essentially Euthymic.  Affect: Mildly Constricted.  Anxiety Level: Mild anxiety reported.  Thought Process: Slightly Slowed.  Thought Content: The patient denies any auditory or visual hallucinations today. He also denies any delusional thinking today. Perception:. wnl.  Judgment: Fair.  Insight: Fair.  Cognition: Oriented to place and person.  Lab Results: No results found for this or any previous visit (from the past 48 hour(s)).   The patient was seen today and states that he  feels ready for discharge.  He states that he is sleeping well without difficulty and denies any depression, suicidal ideations or homicidal ideations as well as any auditory or visual hallucinations or delusional thinking.  The patient's Mother also visited the patient today and feels he is back to his baseline and is ok with his discharge.  Treatment Plan Summary:  1. Daily contact with patient to assess and evaluate symptoms and progress in treatment  2. Medication management  3. The patient will deny suicidal ideations or homicidal ideations for 48 hours prior to discharge and have a depression and anxiety rating of 3 or less. The patient will also deny any auditory or visual hallucinations or delusional thinking.   Plan:  1. Will continue current medications.  2. Laboratory studies reviewed.  3. Will continue to monitor.  4. Discharge today to outpatient follow up.  Cognitive Features That Contribute To Risk:  Loss of executive function    Suicide Risk:  Minimal: No identifiable suicidal ideation.  Patients presenting with no risk factors but with morbid ruminations; may be classified as minimal risk based on the severity of the depressive symptoms  Plan Of Care/Follow-up recommendations:  Activity:  As tolerated. Diet:  Heart Healthy Diet. Tests:  Periodic Follow ups with Primary Care and Mental Health as scheduled.  David [name] 03/17/2012, 1:20 PM

## 2012-03-17 NOTE — Progress Notes (Signed)
Report received from L.Stark Jock. Writer entered pt's room and patient was getting up out of his bed. Writer asked patient if everything was ok and patient reported that he was getting up to do something but could not remember what it was he was going to do. Patient walked to his closet and turned around and got back in the bed. Safety maintained, will continue to monitor.

## 2012-03-17 NOTE — Progress Notes (Signed)
Pt took medication as ordered. He asked Clinical research associate, how she was today. Pt pleasant and cooperative this morning. He denies si and hi. Pt reports no voices at this time. Offered support and 15 minute checks. Safety maintained on unit.

## 2012-03-17 NOTE — Discharge Planning (Signed)
Met with patient in Aftercare Planning Group.   Did paperwork to get aftercare appointment, awaiting actual date.  Per State Regulation 482.30  This chart was reviewed for medical necessity with respect to the patient's Admission/Duration of stay.   Next review due:  03/20/12   Ambrose Mantle, LCSW  03/17/2012  1:14 PM

## 2012-03-17 NOTE — Progress Notes (Signed)
[facility] Group Notes:  (Counselor/Nursing/MHT/Case Management/Adjunct)  03/17/2012 2:35 PM 1:15PM Group  Type of Therapy:  Group Therapy  Participation Level:  Minimal  Participation Quality:  Appropriate  Affect:  Appropriate  Cognitive:  Alert and Oriented  Insight:  Limited  Engagement in Group:  Limited  Engagement in Therapy:  Limited  Modes of Intervention:  Clarification, Problem-solving, Support and Exploratory  Summary of Progress/Problems: Pt was able to verbalize that he could improve his communication with others by asking questions. Pt verbalized when he was younger that his grandfather poured beer in his bottle instead of milk, which is why he is the way that he is. Pt did tend to loose focus but easily was redirected.   Carlisle Enke, Randal Buba 03/17/2012, 2:35 PM

## 2012-03-17 NOTE — Progress Notes (Signed)
03/17/2012         Time: 0930      Group Topic/Focus: The focus of this group is on discussing various styles of communication and communicating assertively using 'I' (feeling) statements.  Participation Level: Minimal  Participation Quality: Appropriate  Affect: Blunted  Cognitive: Alert   Additional Comments: Patient came late to group but was able to join in appropriately. Patient talked about trust being something important to him when it comes to communication.   Jamela Cumbo 03/17/2012 11:26 AM

## 2012-03-21 NOTE — Discharge Summary (Signed)
Physician Discharge Summary Note  Patient:  David [name] is an 56 y.o., male MRN:  [id number] DOB:  03-Jun-1956 Patient phone:  [phone] (home)  Patient address:   [address] [place] [address],   Date of Admission:  03/12/2012 Date of Discharge: 03/17/2012  Axis Diagnosis:   AXIS I:  Schizophrenia, paranoid type, chronic with acute exacerbation , resolved AXIS II:  No diagnosis AXIS [name]:  Pneumonia - resolving AXIS IV:  Stable home and supportive family is an asset AXIS V:  51-60 moderate symptoms  Level of Care:  OP  Hospital Course:  This was the third recent admission for [facility], who was here for short time after being transferred back from our internal medicine unit where he was treated for pneumonia. Please see the two previous admissions. He had previously been back on our unit for 3 days, and developed further pneumonia symptoms,  and was transferred back to internal medicine.  Originally had been admitted to adult psychiatry for an exacerbation of his chronic psychotic symptoms, with disorganized thoughts pressured speech, cursing, and agitation;, all of which were very uncharacteristic for him. It was difficult for [place] to tell what actually precipitated this exacerbation. We receive input from his family, and also mental health staff at [facility]. We believe that it may have been a combination of some disruption in his medications including missing several days of Librium, along with a gradually developing pneumonia.  He was gradually stabilizing quite well by the he came back to our unit this time. And physically he was feeling much better. He was much clearer mentally, with some mild fixed delusions about being owed some money. He was nonagitated, managing his own hygiene, eating well, sleeping well and he completed his antibiotics. Vital signs were stable pulse ox over 95% on room air. He was voicing no dangerous thoughts and was generally cooperative and directable.  His family felt he was ready to come home.  He had been restarted on his Haldol Decanoate and was also receiving oral Haldol at this point which may be tapered as appropriate by the outpatient psychiatrist.  Consults:  None  Significant Diagnostic Studies:  None - refer to Internal Medicine Records  Discharge Vitals:   Blood pressure 101/62, pulse 99, temperature 97.9 F (36.6 C), temperature source Oral, resp. rate 18, height 5\' 9"  (1.753 m), weight 78.472 kg (173 lb), SpO2 99.00%.  Mental Status Exam: See Mental Status Examination and Suicide Risk Assessment completed by Attending Physician prior to discharge.  Discharge destination:  Home  Is patient on multiple antipsychotic therapies at discharge:  No   Has Patient had three or more failed trials of antipsychotic monotherapy by history:  No   Recommended Plan for Multiple Antipsychotic Therapies: n/a  Medication List  As of 03/21/2012  1:51 PM   STOP taking these medications         levofloxacin 750 MG tablet      nicotine 21 mg/24hr patch      zolpidem 5 MG tablet         TAKE these medications      Indication    amLODipine 5 MG tablet   Commonly known as: NORVASC   Take 1 tablet (5 mg total) by mouth daily. For blood pressure.       benztropine 1 MG tablet   Commonly known as: COGENTIN   Take 1 tablet (1 mg total) by mouth 2 (two) times daily in the am and at bedtime.. For EPS  carbamazepine 200 MG tablet   Commonly known as: TEGRETOL   Take 1 tablet (200 mg total) by mouth 2 (two) times daily. For mood stability and impulse control       chlordiazePOXIDE 25 MG capsule   Commonly known as: LIBRIUM   Take one capsule (25mg ) three times daily at 8am, 2pm, and bedtime.  For anxiety.       folic acid 1 MG tablet   Commonly known as: FOLVITE   Take 1 tablet (1 mg total) by mouth daily. For vitamin supplement.       haloperidol 10 MG tablet   Commonly known as: HALDOL   Take 1 tablet (10 mg total) by  mouth 2 (two) times daily in the am and at bedtime.. For psychosis.       haloperidol decanoate 100 MG/ML injection   Commonly known as: HALDOL DECANOATE   Give one IM injection of 100mg .every month.  Last given 03/07/2012, next due 04/07/2012. For psychosis.       pantoprazole 40 MG tablet   Commonly known as: PROTONIX   Take 1 tablet (40 mg total) by mouth daily. For acid reflux.       thiamine 100 MG tablet   Take 1 tablet (100 mg total) by mouth daily. Vitamin B1 supplement       traZODone 150 MG tablet   Commonly known as: DESYREL   Take 1 tablet (150 mg total) by mouth at bedtime. For sleep.             Follow-up Information    Follow up with [facility] on 03/19/2012. [facility] appointment for [facility] Discharge - Auth [id number])    Contact information:   [address] [facility] Telephone:  [phone]         Follow-up recommendations:  Activity:  unrestricted Diet:  regular  Signed: SCOTT,MARGARET A 03/21/2012, 1:51 PM

## 2012-03-21 NOTE — Progress Notes (Signed)
Patient Discharge Instructions:  Psychiatric Admission Assessment Note Provided,  03/20/2012 After Visit Summary (AVS) Provided,  03/20/2012 Face Sheet Provided, 03/20/2012 Faxed/Sent to the Next Level Care provider:  03/20/2012 Sent Suicide Risk Assessment - Discharge Assessment 03/20/2012  Faxed to [facility] @ [phone]  Meaghann Choo, Eduard Clos, 03/21/2012, 5:02 PM

## 2012-04-16 DIAGNOSIS — F259 Schizoaffective disorder, unspecified: Secondary | ICD-10-CM | POA: Diagnosis not present

## 2012-04-24 DIAGNOSIS — I10 Essential (primary) hypertension: Secondary | ICD-10-CM | POA: Diagnosis not present

## 2012-04-24 DIAGNOSIS — E785 Hyperlipidemia, unspecified: Secondary | ICD-10-CM | POA: Diagnosis not present

## 2012-04-24 DIAGNOSIS — R1110 Vomiting, unspecified: Secondary | ICD-10-CM | POA: Diagnosis not present

## 2012-04-24 DIAGNOSIS — R0602 Shortness of breath: Secondary | ICD-10-CM | POA: Diagnosis not present

## 2012-05-09 DIAGNOSIS — H6120 Impacted cerumen, unspecified ear: Secondary | ICD-10-CM | POA: Diagnosis not present

## 2012-05-28 DIAGNOSIS — F259 Schizoaffective disorder, unspecified: Secondary | ICD-10-CM | POA: Diagnosis not present

## 2012-06-25 DIAGNOSIS — F259 Schizoaffective disorder, unspecified: Secondary | ICD-10-CM | POA: Diagnosis not present

## 2012-07-25 DIAGNOSIS — M542 Cervicalgia: Secondary | ICD-10-CM | POA: Diagnosis not present

## 2012-09-22 DIAGNOSIS — R109 Unspecified abdominal pain: Secondary | ICD-10-CM | POA: Diagnosis not present

## 2012-09-22 DIAGNOSIS — R072 Precordial pain: Secondary | ICD-10-CM | POA: Diagnosis not present

## 2012-09-22 DIAGNOSIS — F17200 Nicotine dependence, unspecified, uncomplicated: Secondary | ICD-10-CM | POA: Diagnosis not present

## 2012-09-22 DIAGNOSIS — I509 Heart failure, unspecified: Secondary | ICD-10-CM | POA: Diagnosis not present

## 2012-09-22 DIAGNOSIS — I10 Essential (primary) hypertension: Secondary | ICD-10-CM | POA: Diagnosis not present

## 2012-09-22 DIAGNOSIS — Z79899 Other long term (current) drug therapy: Secondary | ICD-10-CM | POA: Diagnosis not present

## 2012-09-22 DIAGNOSIS — R112 Nausea with vomiting, unspecified: Secondary | ICD-10-CM | POA: Diagnosis not present

## 2012-09-22 DIAGNOSIS — R079 Chest pain, unspecified: Secondary | ICD-10-CM

## 2012-09-23 DIAGNOSIS — K5289 Other specified noninfective gastroenteritis and colitis: Secondary | ICD-10-CM | POA: Diagnosis not present

## 2012-09-25 DIAGNOSIS — I10 Essential (primary) hypertension: Secondary | ICD-10-CM | POA: Diagnosis not present

## 2012-09-25 DIAGNOSIS — R1013 Epigastric pain: Secondary | ICD-10-CM | POA: Diagnosis not present

## 2012-09-25 DIAGNOSIS — R109 Unspecified abdominal pain: Secondary | ICD-10-CM | POA: Diagnosis not present

## 2012-09-25 DIAGNOSIS — R112 Nausea with vomiting, unspecified: Secondary | ICD-10-CM | POA: Diagnosis not present

## 2012-10-03 DIAGNOSIS — R109 Unspecified abdominal pain: Secondary | ICD-10-CM | POA: Diagnosis not present

## 2012-10-03 DIAGNOSIS — R1013 Epigastric pain: Secondary | ICD-10-CM | POA: Diagnosis not present

## 2012-10-03 DIAGNOSIS — R1011 Right upper quadrant pain: Secondary | ICD-10-CM | POA: Diagnosis not present

## 2012-10-15 DIAGNOSIS — Z23 Encounter for immunization: Secondary | ICD-10-CM | POA: Diagnosis not present

## 2012-11-15 IMAGING — CR DG CHEST 2V
2 series · 2 of 2 positions shown · non-contrast
Comparison: 02/18/2012 and earlier.

CLINICAL DATA: 55-year-old male with fever, tachycardia, shortness
of breath.

CHEST - 2 VIEW

[w chest lat]
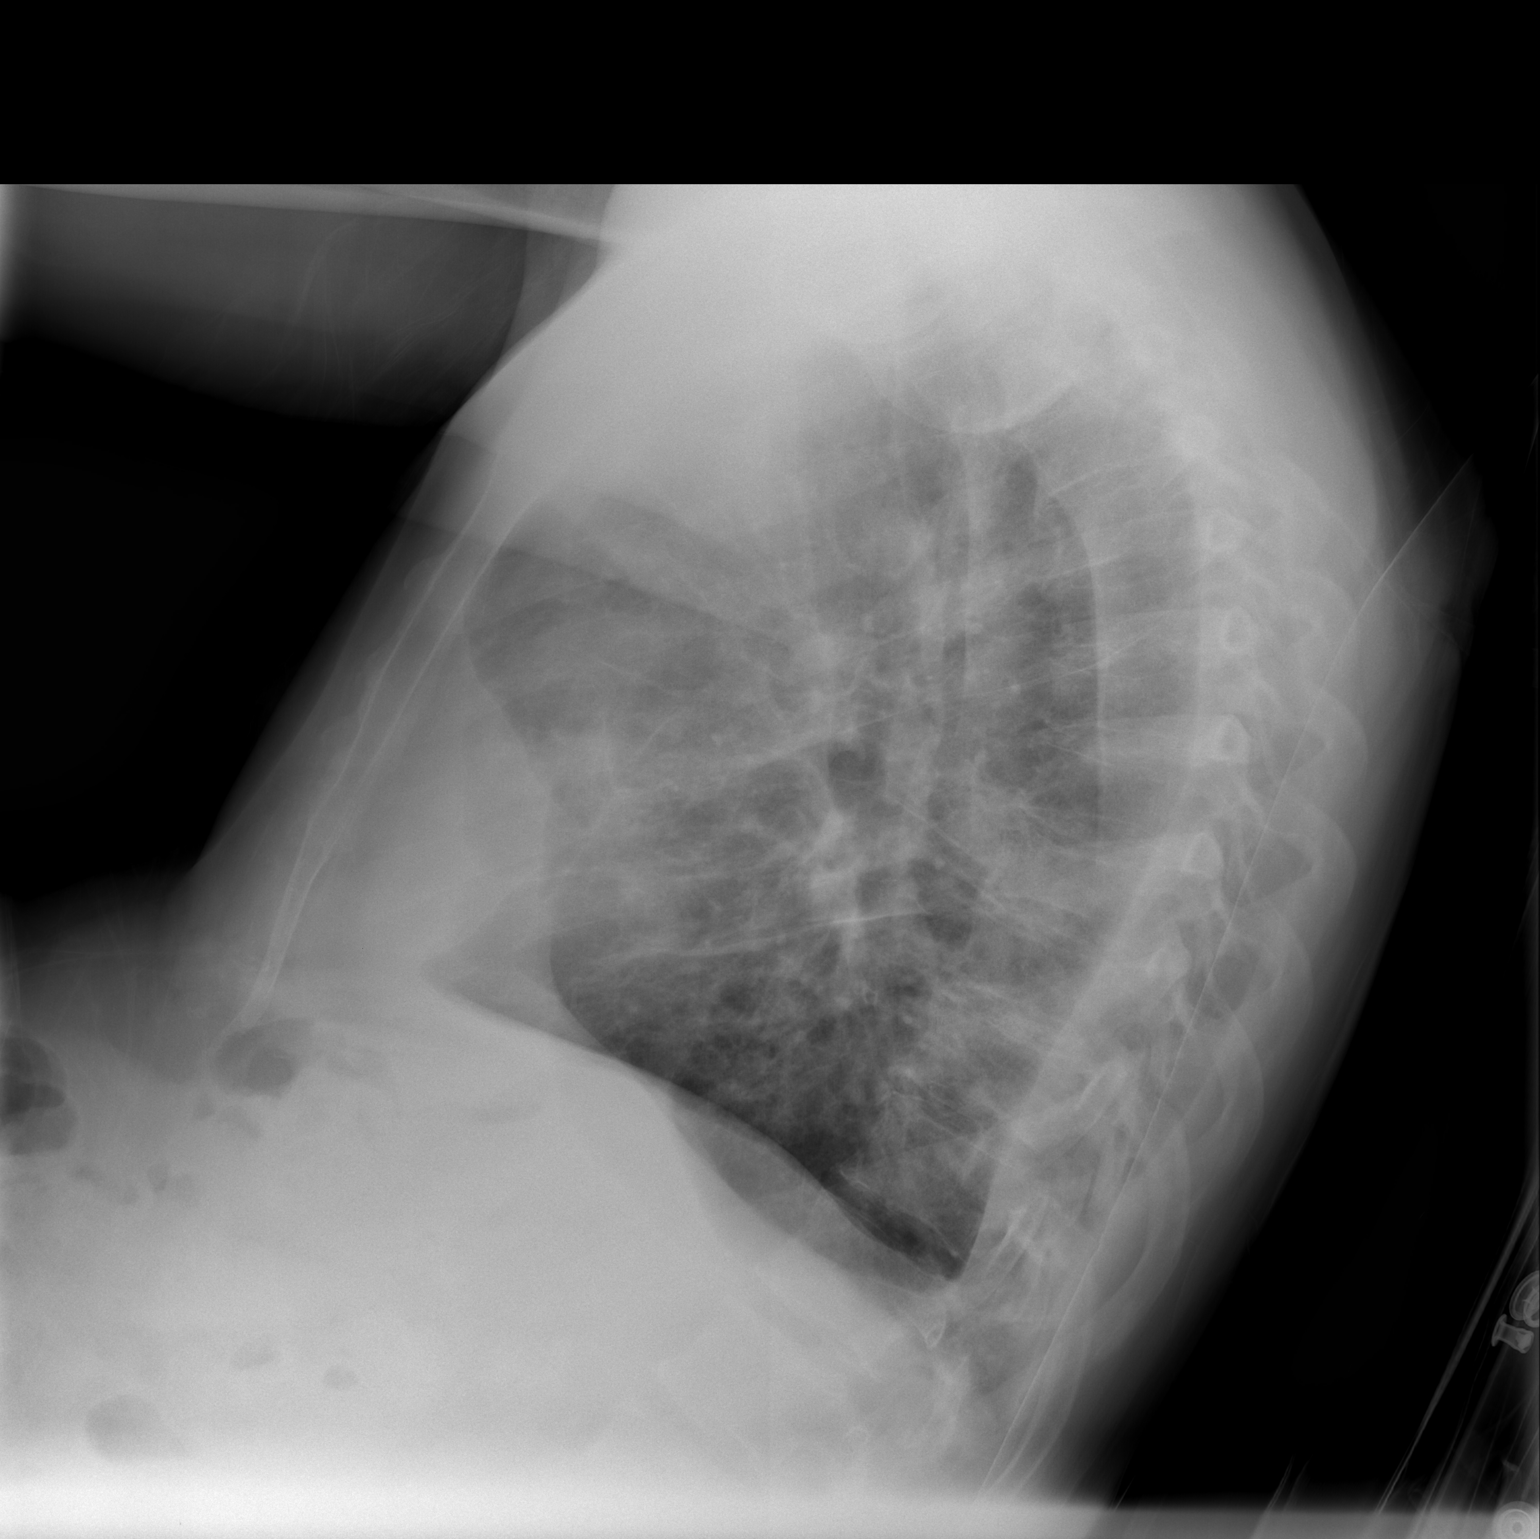

[view not recorded]
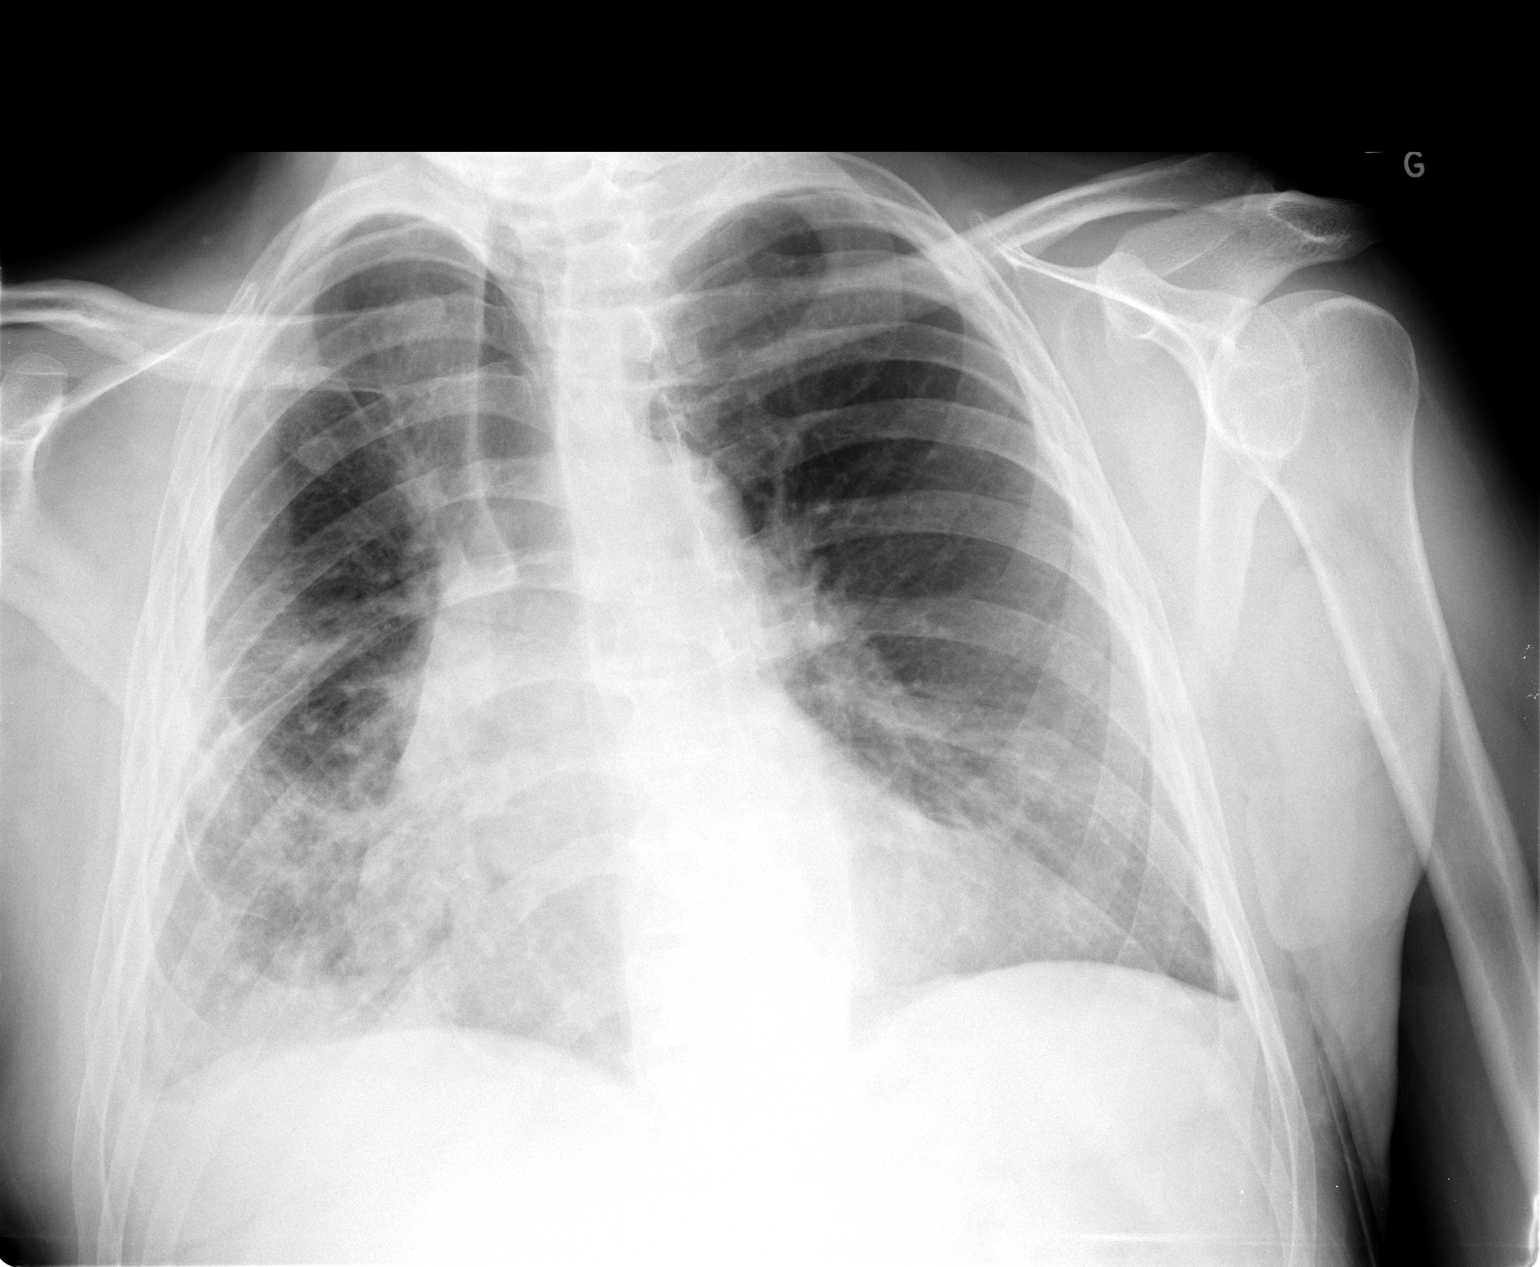

[2 of 2 positions shown; findings below may reference images not displayed]

FINDINGS: AP and lateral views of the chest.  Right lower lobe
airspace disease.  Small right greater than left pleural effusions.
Lower lung volumes.  Cardiac size and mediastinal contours are
within normal limits.  Visualized tracheal air column is within
normal limits.  No pneumothorax.  No pulmonary edema
IMPRESSION: Increased right lower lung airspace disease compatible with
pneumonia or aspiration.  Small right greater than left pleural
effusions.

## 2012-11-19 DIAGNOSIS — F259 Schizoaffective disorder, unspecified: Secondary | ICD-10-CM | POA: Diagnosis not present

## 2012-11-20 IMAGING — CR DG CHEST 1V PORT
1 series · 1 of 1 positions shown · non-contrast
Comparison: Chest x-ray 03/04/2012.

CLINICAL DATA: Shortness of breath, altered mental status.

PORTABLE CHEST - 1 VIEW

[AP]
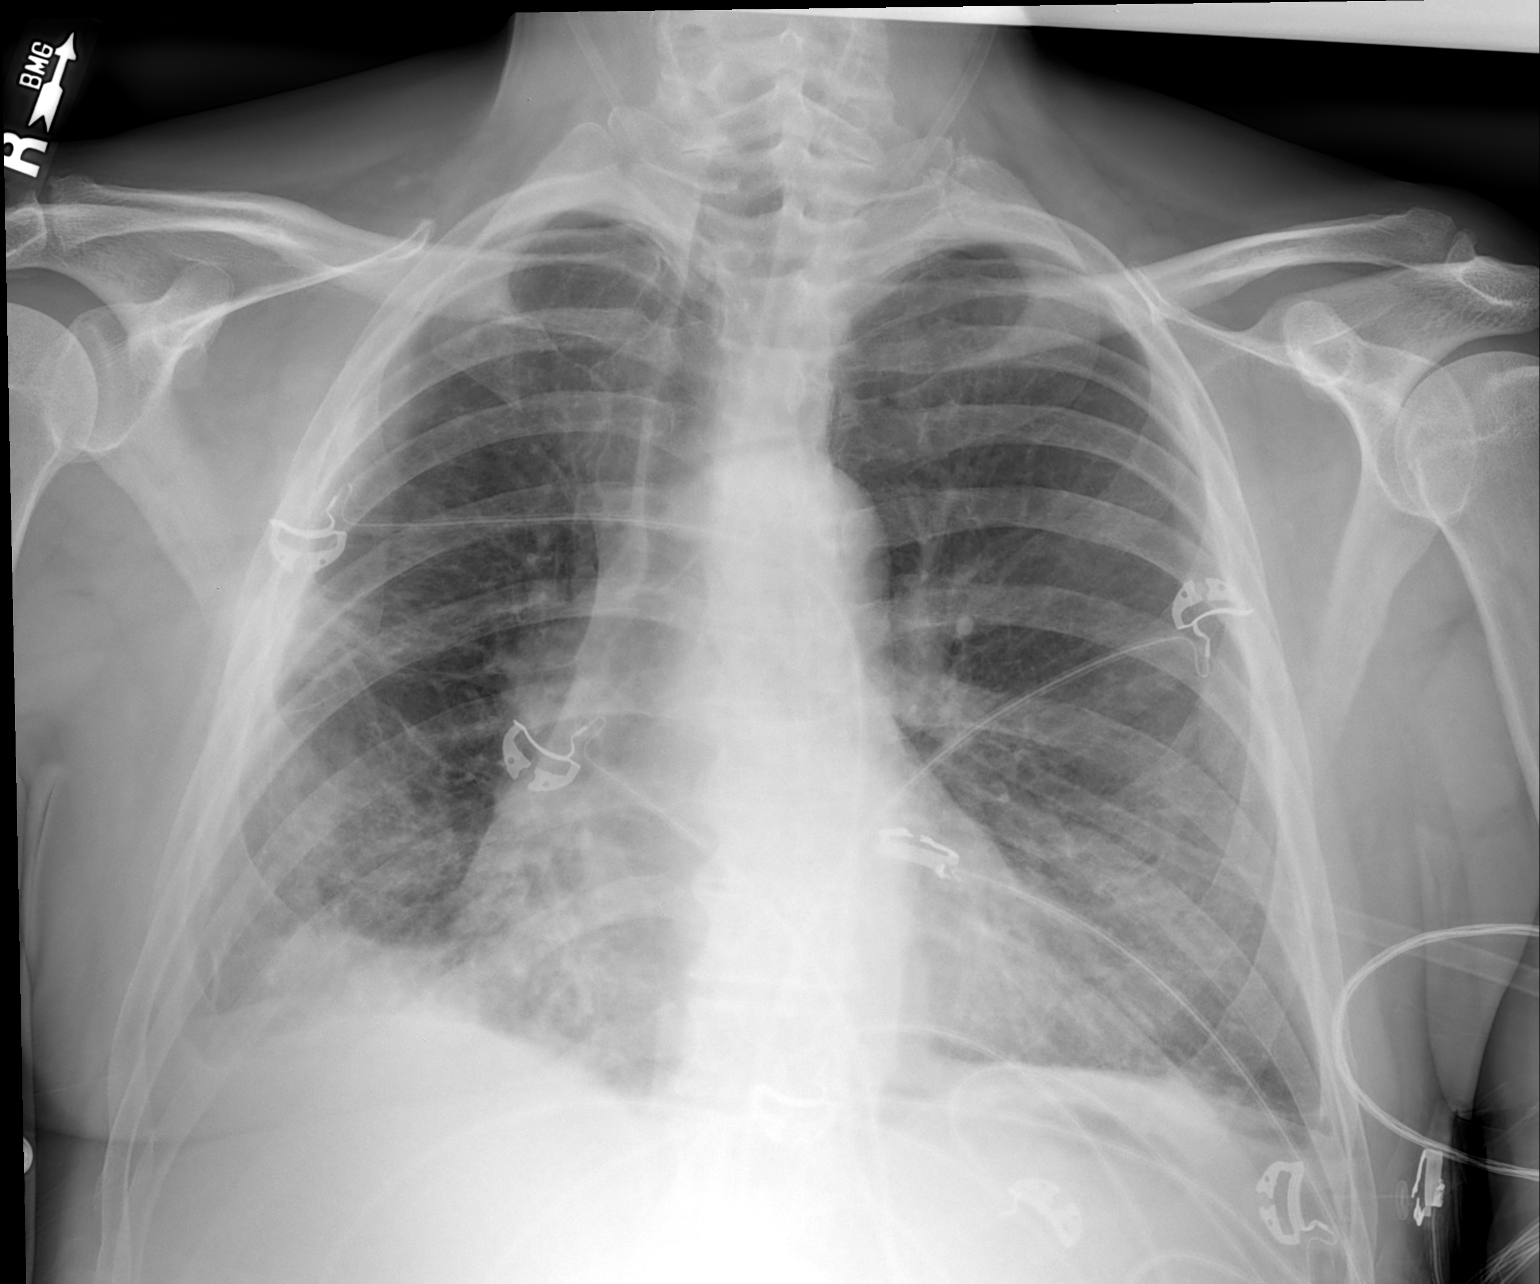

[1 of 1 positions shown; findings below may reference images not displayed]

FINDINGS: Lung volumes remain slightly low.  There are irregular
interstitial and patchy airspace opacities throughout the right mid
and lower lung, slightly improved compared to recent prior
examination, with exception of increasing blunting of the right
costophrenic sulcus, indicative of a slight increase in right-sided
pleural fluid.  Left lung appears relatively clear. Trace left
pleural effusion unchanged.  Pulmonary vasculature is normal.
Heart size is normal. The patient is rotated to the right on
today's exam, resulting in distortion of the mediastinal contours
and reduced diagnostic sensitivity and specificity for mediastinal
pathology.
IMPRESSION: 1.  Overall, compared to recent prior study, there is slight
improved aeration in the right mid and lower lung, compatible with
some clearing pneumonia.  Small right-sided pleural effusion has
slightly increased.  Trace left pleural effusion is unchanged.

## 2012-12-02 DIAGNOSIS — M542 Cervicalgia: Secondary | ICD-10-CM | POA: Diagnosis not present

## 2013-05-28 ENCOUNTER — Other Ambulatory Visit: Payer: Self-pay | Admitting: *Deleted

## 2013-05-28 MED ORDER — PANTOPRAZOLE SODIUM 40 MG PO TBEC: 40.0000 mg | DELAYED_RELEASE_TABLET | Freq: Every day | ORAL | Status: DC

## 2013-05-28 NOTE — Telephone Encounter (Signed)
LAST OV 12/13. 

## 2013-06-01 ENCOUNTER — Other Ambulatory Visit: Payer: Self-pay

## 2013-06-01 NOTE — Telephone Encounter (Signed)
Last seen 12/13  If approved call in and have nurse call patient to inform

## 2013-06-01 NOTE — Telephone Encounter (Signed)
Notified [facility] pharmacy ok to refill Protonix Ambien denied, pt NTBS

## 2013-07-01 DIAGNOSIS — F259 Schizoaffective disorder, unspecified: Secondary | ICD-10-CM | POA: Diagnosis not present

## 2013-08-03 ENCOUNTER — Other Ambulatory Visit: Payer: Self-pay | Admitting: Nurse Practitioner

## 2013-08-03 NOTE — Telephone Encounter (Signed)
Patient notified at last refill that NTBS. Please advise 

## 2013-09-01 ENCOUNTER — Other Ambulatory Visit: Payer: Self-pay | Admitting: Nurse Practitioner

## 2013-09-07 ENCOUNTER — Encounter: Payer: Self-pay | Admitting: Family Medicine

## 2013-09-07 ENCOUNTER — Ambulatory Visit (INDEPENDENT_AMBULATORY_CARE_PROVIDER_SITE_OTHER): Payer: Medicare Other | Admitting: Family Medicine

## 2013-09-07 VITALS — BP 114/74 | HR 95 | Temp 98.4°F | Ht 69.0 in | Wt 161.0 lb

## 2013-09-07 DIAGNOSIS — F489 Nonpsychotic mental disorder, unspecified: Secondary | ICD-10-CM | POA: Diagnosis not present

## 2013-09-07 DIAGNOSIS — K219 Gastro-esophageal reflux disease without esophagitis: Secondary | ICD-10-CM | POA: Diagnosis not present

## 2013-09-07 DIAGNOSIS — F99 Mental disorder, not otherwise specified: Secondary | ICD-10-CM

## 2013-09-07 MED ORDER — PANTOPRAZOLE SODIUM 40 MG PO TBEC: 40.0000 mg | DELAYED_RELEASE_TABLET | Freq: Every day | ORAL | Status: DC

## 2013-09-07 NOTE — Progress Notes (Signed)
  Subjective:    Patient ID: David [name], male    DOB: 07-27-1956, 57 y.o.   MRN: [id number]  HPI This 57 y.o. male presents for evaluation of GERD and needing a refill on his  Protonix.  He states he has been having insomnia and his psychiatrist rx's  trazadone and this is not working and he wants [place].   Review of Systems No chest pain, SOB, HA, dizziness, vision change, N/V, diarrhea, constipation, dysuria, urinary urgency or frequency, myalgias, arthralgias or rash.     Objective:   Physical Exam Vital signs noted  Well developed well nourished male.  HEENT - Head atraumatic Normocephalic                Eyes - PERRLA, Conjuctiva - clear Sclera- Clear EOMI                Ears - EAC's Wnl TM's Wnl Gross Hearing WNL                Nose - Nares patent                 Throat - oropharanx wnl Respiratory - Lungs CTA bilateral Cardiac - RRR S1 and S2 without murmur GI - Abdomen soft Nontender and bowel sounds active x 4 Extremities - No edema. Neuro - Grossly intact.       Assessment & Plan:  GERD (gastroesophageal reflux disease) - Plan: pantoprazole (PROTONIX) 40 MG tablet  Psychiatric illness - Advised patient to follow up with psychiatrist.

## 2013-09-07 NOTE — Patient Instructions (Signed)

## 2013-09-23 DIAGNOSIS — F259 Schizoaffective disorder, unspecified: Secondary | ICD-10-CM | POA: Diagnosis not present

## 2013-11-01 DIAGNOSIS — Z23 Encounter for immunization: Secondary | ICD-10-CM | POA: Diagnosis not present

## 2013-12-16 DIAGNOSIS — F259 Schizoaffective disorder, unspecified: Secondary | ICD-10-CM | POA: Diagnosis not present

## 2014-01-01 ENCOUNTER — Emergency Department (HOSPITAL_COMMUNITY)
Admission: EM | Admit: 2014-01-01 | Discharge: 2014-01-05 | Disposition: A | Discharge status: Other Acute Inpatient Hospital | Payer: Medicare Other | Attending: Emergency Medicine | Admitting: Emergency Medicine

## 2014-01-01 ENCOUNTER — Inpatient Hospital Stay (HOSPITAL_COMMUNITY): Admission: EM | Admit: 2014-01-01 | Payer: Medicare Other | Source: Intra-hospital | Admitting: Psychiatry

## 2014-01-01 ENCOUNTER — Encounter (HOSPITAL_COMMUNITY): Payer: Self-pay | Admitting: Emergency Medicine

## 2014-01-01 DIAGNOSIS — F201 Disorganized schizophrenia: Secondary | ICD-10-CM | POA: Diagnosis not present

## 2014-01-01 DIAGNOSIS — Z79899 Other long term (current) drug therapy: Secondary | ICD-10-CM | POA: Insufficient documentation

## 2014-01-01 DIAGNOSIS — J449 Chronic obstructive pulmonary disease, unspecified: Secondary | ICD-10-CM | POA: Diagnosis not present

## 2014-01-01 DIAGNOSIS — IMO0002 Reserved for concepts with insufficient information to code with codable children: Secondary | ICD-10-CM | POA: Insufficient documentation

## 2014-01-01 DIAGNOSIS — J4489 Other specified chronic obstructive pulmonary disease: Secondary | ICD-10-CM | POA: Insufficient documentation

## 2014-01-01 DIAGNOSIS — F411 Generalized anxiety disorder: Secondary | ICD-10-CM | POA: Diagnosis not present

## 2014-01-01 DIAGNOSIS — F911 Conduct disorder, childhood-onset type: Secondary | ICD-10-CM | POA: Insufficient documentation

## 2014-01-01 DIAGNOSIS — F17200 Nicotine dependence, unspecified, uncomplicated: Secondary | ICD-10-CM | POA: Insufficient documentation

## 2014-01-01 DIAGNOSIS — K219 Gastro-esophageal reflux disease without esophagitis: Secondary | ICD-10-CM | POA: Diagnosis not present

## 2014-01-01 DIAGNOSIS — Z8701 Personal history of pneumonia (recurrent): Secondary | ICD-10-CM | POA: Insufficient documentation

## 2014-01-01 DIAGNOSIS — Z008 Encounter for other general examination: Secondary | ICD-10-CM | POA: Diagnosis not present

## 2014-01-01 DIAGNOSIS — Z0000 Encounter for general adult medical examination without abnormal findings: Secondary | ICD-10-CM | POA: Diagnosis not present

## 2014-01-01 DIAGNOSIS — F200 Paranoid schizophrenia: Secondary | ICD-10-CM | POA: Insufficient documentation

## 2014-01-01 DIAGNOSIS — G4700 Insomnia, unspecified: Secondary | ICD-10-CM | POA: Diagnosis not present

## 2014-01-01 DIAGNOSIS — F29 Unspecified psychosis not due to a substance or known physiological condition: Secondary | ICD-10-CM | POA: Diagnosis not present

## 2014-01-01 LAB — COMPREHENSIVE METABOLIC PANEL
ALBUMIN: 3.7 g/dL (ref 3.5–5.2)
ALT: 25 U/L (ref 0–53)
AST: 44 U/L — ABNORMAL HIGH (ref 0–37)
Alkaline Phosphatase: 93 U/L (ref 39–117)
BUN: 9 mg/dL (ref 6–23)
CO2: 23 meq/L (ref 19–32)
Calcium: 9.4 mg/dL (ref 8.4–10.5)
Chloride: 95 mEq/L — ABNORMAL LOW (ref 96–112)
Creatinine, Ser: 1.39 mg/dL — ABNORMAL HIGH (ref 0.50–1.35)
GFR calc Af Amer: 64 mL/min — ABNORMAL LOW (ref >90)
GFR calc non Af Amer: 55 mL/min — ABNORMAL LOW (ref >90)
Glucose, Bld: 97 mg/dL (ref 70–99)
Potassium: 4.2 mEq/L (ref 3.7–5.3)
SODIUM: 133 meq/L — AB (ref 137–147)
TOTAL PROTEIN: 7.1 g/dL (ref 6.0–8.3)
Total Bilirubin: 0.6 mg/dL (ref 0.3–1.2)

## 2014-01-01 LAB — CBC
HCT: 44.2 % (ref 39.0–52.0)
Hemoglobin: 14.8 g/dL (ref 13.0–17.0)
MCH: 28.3 pg (ref 26.0–34.0)
MCHC: 33.5 g/dL (ref 30.0–36.0)
MCV: 84.5 fL (ref 78.0–100.0)
Platelets: 133 10*3/uL — ABNORMAL LOW (ref 150–400)
RBC: 5.23 MIL/uL (ref 4.22–5.81)
RDW: 15.3 % (ref 11.5–15.5)
WBC: 16 10*3/uL — ABNORMAL HIGH (ref 4.0–10.5)

## 2014-01-01 LAB — RAPID URINE DRUG SCREEN, HOSP PERFORMED
AMPHETAMINES: NOT DETECTED
BENZODIAZEPINES: NOT DETECTED
Barbiturates: NOT DETECTED
COCAINE: NOT DETECTED
OPIATES: NOT DETECTED
TETRAHYDROCANNABINOL: NOT DETECTED

## 2014-01-01 LAB — SALICYLATE LEVEL: Salicylate Lvl: <2 mg/dL — ABNORMAL LOW (ref 2.8–20.0)

## 2014-01-01 LAB — ETHANOL: Alcohol, Ethyl (B): <11 mg/dL (ref 0–11)

## 2014-01-01 LAB — ACETAMINOPHEN LEVEL: Acetaminophen (Tylenol), Serum: <15 ug/mL (ref 10–30)

## 2014-01-01 MED ORDER — AMLODIPINE BESYLATE 5 MG PO TABS
5.0000 mg | ORAL_TABLET | Freq: Once | ORAL | Status: AC
  Administered 2014-01-01: 5 mg via ORAL
  Filled 2014-01-01: qty 1

## 2014-01-01 MED ORDER — NICOTINE 21 MG/24HR TD PT24
21.0000 mg | MEDICATED_PATCH | Freq: Every day | TRANSDERMAL | Status: DC
  Administered 2014-01-01 – 2014-01-05 (×8): 21 mg via TRANSDERMAL
  Filled 2014-01-01 (×8): qty 1

## 2014-01-01 MED ORDER — ZOLPIDEM TARTRATE 5 MG PO TABS
5.0000 mg | ORAL_TABLET | Freq: Every evening | ORAL | Status: DC | PRN
  Administered 2014-01-01 – 2014-01-04 (×3): 5 mg via ORAL
  Filled 2014-01-01 (×3): qty 1

## 2014-01-01 MED ORDER — DIPHENHYDRAMINE HCL 50 MG/ML IJ SOLN
INTRAMUSCULAR | Status: AC
  Filled 2014-01-01: qty 1

## 2014-01-01 MED ORDER — HALOPERIDOL 5 MG PO TABS: 5.0000 mg | ORAL_TABLET | Freq: Once | ORAL | Status: DC

## 2014-01-01 MED ORDER — LORAZEPAM 1 MG PO TABS
2.0000 mg | ORAL_TABLET | Freq: Once | ORAL | Status: AC
  Administered 2014-01-01: 2 mg via ORAL
  Filled 2014-01-01: qty 2

## 2014-01-01 MED ORDER — DIAZEPAM 5 MG PO TABS
5.0000 mg | ORAL_TABLET | Freq: Every morning | ORAL | Status: DC
  Administered 2014-01-02 – 2014-01-04 (×3): 5 mg via ORAL
  Filled 2014-01-01 (×3): qty 1

## 2014-01-01 MED ORDER — ACETAMINOPHEN 325 MG PO TABS: 650.0000 mg | ORAL_TABLET | ORAL | Status: DC | PRN

## 2014-01-01 MED ORDER — LORAZEPAM 1 MG PO TABS: 2.0000 mg | ORAL_TABLET | Freq: Once | ORAL | Status: DC

## 2014-01-01 MED ORDER — ONDANSETRON HCL 4 MG PO TABS
4.0000 mg | ORAL_TABLET | Freq: Three times a day (TID) | ORAL | Status: DC | PRN
  Administered 2014-01-02: 4 mg via ORAL
  Filled 2014-01-01: qty 1

## 2014-01-01 MED ORDER — DIPHENHYDRAMINE HCL 50 MG/ML IJ SOLN
50.0000 mg | Freq: Once | INTRAMUSCULAR | Status: AC
  Administered 2014-01-01: 50 mg via INTRAMUSCULAR

## 2014-01-01 MED ORDER — IBUPROFEN 200 MG PO TABS
600.0000 mg | ORAL_TABLET | Freq: Three times a day (TID) | ORAL | Status: DC | PRN
  Administered 2014-01-03: 600 mg via ORAL
  Filled 2014-01-01: qty 3

## 2014-01-01 MED ORDER — LORAZEPAM 1 MG PO TABS
1.0000 mg | ORAL_TABLET | Freq: Once | ORAL | Status: AC
  Administered 2014-01-03: 1 mg via ORAL
  Filled 2014-01-01: qty 1

## 2014-01-01 MED ORDER — LORAZEPAM 2 MG/ML IJ SOLN
2.0000 mg | Freq: Once | INTRAMUSCULAR | Status: AC
  Administered 2014-01-01: 2 mg via INTRAMUSCULAR

## 2014-01-01 MED ORDER — PANTOPRAZOLE SODIUM 40 MG PO TBEC
40.0000 mg | DELAYED_RELEASE_TABLET | Freq: Every day | ORAL | Status: DC
  Administered 2014-01-02 – 2014-01-05 (×4): 40 mg via ORAL
  Filled 2014-01-01 (×4): qty 1

## 2014-01-01 MED ORDER — HALOPERIDOL LACTATE 5 MG/ML IJ SOLN
INTRAMUSCULAR | Status: AC
  Filled 2014-01-01: qty 1

## 2014-01-01 MED ORDER — OLANZAPINE 5 MG PO TBDP
5.0000 mg | ORAL_TABLET | Freq: Once | ORAL | Status: AC
  Administered 2014-01-01: 5 mg via ORAL
  Filled 2014-01-01: qty 1

## 2014-01-01 MED ORDER — HALOPERIDOL LACTATE 5 MG/ML IJ SOLN
5.0000 mg | Freq: Once | INTRAMUSCULAR | Status: AC
  Administered 2014-01-01: 5 mg via INTRAMUSCULAR

## 2014-01-01 MED ORDER — DIAZEPAM 5 MG PO TABS: 5.0000 mg | ORAL_TABLET | Freq: Every morning | ORAL | Status: DC

## 2014-01-01 MED ORDER — LORAZEPAM 2 MG/ML IJ SOLN
INTRAMUSCULAR | Status: AC
  Filled 2014-01-01: qty 1

## 2014-01-01 NOTE — ED Provider Notes (Signed)
Called about pt's BP this evening.  I reviewed his old medications.  In 2013, there are orders for amlodipine.  Suspect pt does have chronic hypertension.  Screening labs with mild renal insufficiency.  Will start norvasc, 5 mg po daily.  Kathalene Frames, MD 01/01/14 [phone]

## 2014-01-01 NOTE — Consult Note (Signed)
[facility] Face-to-Face Psychiatry Consult   Reason for Consult:  Psychosis Referring Physician:  EDP David [name] is an 58 y.o. male.  Assessment: AXIS I:  Schizophrenia, disorganized AXIS II:  Deferred AXIS [name]:   Past Medical History  Diagnosis Date  . Schizophrenia   . Pneumonia   . Anxiety   . GERD (gastroesophageal reflux disease)   . COPD (chronic obstructive pulmonary disease)    AXIS IV:  other psychosocial or environmental problems and problems related to social environment AXIS V:  21-30 behavior considerably influenced by delusions or hallucinations OR serious impairment in judgment, communication OR inability to function in almost all areas  Plan:  Recommend psychiatric Inpatient admission when medically cleared.  Subjective:   David [name] is a 58 y.o. male patient admitted with Schizophrenia, disorganized, Psychosis.  HPI:  David [name] is a 58 y.o. male brought in by his brother for disorganized and Psychotic symptoms.   Brother states that the pt has new doctor that changed his regular medicines. Brother states pt hasn't slept in about 4 days since the medicine changes. Brother also reports that pt has been yelling random thoughts and is now hoarse from all of the yelling. Brother states that pt will listen to him, but no one else. The pt mother is the POA and her number is [phone]. Brother states patient sees a Teacher, music at [employer] in [employer]. Patient lives alone and he has not been eating well.  Patient, during this interview is unable to answer any question.  He intermittently gets up and moves his legs as if he is in [employer] class.  Patient brother David [name] gave collateral information and his phone number [phone].  Patient will be medicated and allowed to rest.  We will retry interviewing patient when he can participate in the interview.    HPI Elements:   Location:  [facility]. Quality:  SEVERE, PATIENT PSYCHOTIC, DISORIENTED. Severity:   SEVERE.  Past Psychiatric History: Past Medical History  Diagnosis Date  . Schizophrenia   . Pneumonia   . Anxiety   . GERD (gastroesophageal reflux disease)   . COPD (chronic obstructive pulmonary disease)     reports that he has been smoking Cigarettes.  He has a 40 pack-year smoking history. He has never used smokeless tobacco. He reports that he does not drink alcohol or use illicit drugs. Family History  Problem Relation Age of Onset  . Arthritis Mother   . Hypertension Mother            Allergies:   Allergies  Allergen Reactions  . Lithium Other (See Comments)    "get's very sick"    ACT Assessment Complete:  No:   Past Psychiatric History: Diagnosis:  Schizophrenia, disorganized  Hospitalizations:  [facility] 2013  Outpatient Care:  [facility]  Substance Abuse Care:  In the 1970s  Self-Mutilation:  Brother denies  Suicidal Attempts:  OD IN 1970S  Homicidal Behaviors:  Brother denies   Violent Behaviors: brother denies   Place of Residence:  [place], [place] Marital Status:  Divorced Employed/Unemployed:  umemployed Education:  High school diploma Family Supports:  yes Objective: Blood pressure 185/88, pulse 98, temperature 99.1 F (37.3 C), temperature source Oral, resp. rate 20, SpO2 98.00%.There is no weight on file to calculate BMI. Results for orders placed during the hospital encounter of 01/01/14 (from the past 72 hour(s))  ACETAMINOPHEN LEVEL     Status: None   Collection Time    01/01/14  1:50  PM      Result Value Range   Acetaminophen (Tylenol), Serum <15.0  10 - 30 ug/mL   Comment:            THERAPEUTIC CONCENTRATIONS VARY     SIGNIFICANTLY. A RANGE OF 10-30     ug/mL MAY BE AN EFFECTIVE     CONCENTRATION FOR MANY PATIENTS.     HOWEVER, SOME ARE BEST TREATED     AT CONCENTRATIONS OUTSIDE THIS     RANGE.     ACETAMINOPHEN CONCENTRATIONS     >150 ug/mL AT 4 HOURS AFTER     INGESTION AND >50 ug/mL AT 12     HOURS AFTER INGESTION ARE      OFTEN ASSOCIATED WITH TOXIC     REACTIONS.  CBC     Status: Abnormal   Collection Time    01/01/14  1:50 PM      Result Value Range   WBC 16.0 (*) 4.0 - 10.5 K/uL   RBC 5.23  4.22 - 5.81 MIL/uL   Hemoglobin 14.8  13.0 - 17.0 g/dL   HCT 80.7  66.6 - 62.3 %   MCV 84.5  78.0 - 100.0 fL   MCH 28.3  26.0 - 34.0 pg   MCHC 33.5  30.0 - 36.0 g/dL   RDW 12.9  06.4 - 61.4 %   Platelets 133 (*) 150 - 400 K/uL  COMPREHENSIVE METABOLIC PANEL     Status: Abnormal   Collection Time    01/01/14  1:50 PM      Result Value Range   Sodium 133 (*) 137 - 147 mEq/L   Comment: Please note change in reference range.   Potassium 4.2  3.7 - 5.3 mEq/L   Comment: Please note change in reference range.   Chloride 95 (*) 96 - 112 mEq/L   CO2 23  19 - 32 mEq/L   Glucose, Bld 97  70 - 99 mg/dL   BUN 9  6 - 23 mg/dL   Creatinine, Ser 0.12 (*) 0.50 - 1.35 mg/dL   Calcium 9.4  8.4 - 03.5 mg/dL   Total Protein 7.1  6.0 - 8.3 g/dL   Albumin 3.7  3.5 - 5.2 g/dL   AST 44 (*) 0 - 37 U/L   ALT 25  0 - 53 U/L   Alkaline Phosphatase 93  39 - 117 U/L   Total Bilirubin 0.6  0.3 - 1.2 mg/dL   GFR calc non Af Amer 55 (*) >90 mL/min   GFR calc Af Amer 64 (*) >90 mL/min   Comment: (NOTE)     The eGFR has been calculated using the CKD EPI equation.     This calculation has not been validated in all clinical situations.     eGFR's persistently <90 mL/min signify possible Chronic Kidney     Disease.  ETHANOL     Status: None   Collection Time    01/01/14  1:50 PM      Result Value Range   Alcohol, Ethyl (B) <11  0 - 11 mg/dL   Comment:            LOWEST DETECTABLE LIMIT FOR     SERUM ALCOHOL IS 11 mg/dL     FOR MEDICAL PURPOSES ONLY  SALICYLATE LEVEL     Status: Abnormal   Collection Time    01/01/14  1:50 PM      Result Value Range   Salicylate Lvl <2.0 (*) 2.8 - 20.0 mg/dL  URINE RAPID DRUG SCREEN (HOSP PERFORMED)     Status: None   Collection Time    01/01/14  2:31 PM      Result Value Range   Opiates  NONE DETECTED  NONE DETECTED   Cocaine NONE DETECTED  NONE DETECTED   Benzodiazepines NONE DETECTED  NONE DETECTED   Amphetamines NONE DETECTED  NONE DETECTED   Tetrahydrocannabinol NONE DETECTED  NONE DETECTED   Barbiturates NONE DETECTED  NONE DETECTED   Comment:            DRUG SCREEN FOR MEDICAL PURPOSES     ONLY.  IF CONFIRMATION IS NEEDED     FOR ANY PURPOSE, NOTIFY LAB     WITHIN 5 DAYS.                LOWEST DETECTABLE LIMITS     FOR URINE DRUG SCREEN     Drug Class       Cutoff (ng/mL)     Amphetamine      1000     Barbiturate      200     Benzodiazepine   409     Tricyclics       811     Opiates          300     Cocaine          300     THC              50   Labs are reviewed and are pertinent for abnormal CBC and CMP results.  .  Current Facility-Administered Medications  Medication Dose Route Frequency Provider Last Rate Last Dose  . acetaminophen (TYLENOL) tablet 650 mg  650 mg Oral Q4H PRN Linus Mako, PA-C      . [START ON 01/02/2014] diazepam (VALIUM) tablet 5 mg  5 mg Oral q morning - 10a Houston Siren, MD      . diphenhydrAMINE (BENADRYL) 50 MG/ML injection           . diphenhydrAMINE (BENADRYL) injection 50 mg  50 mg Intramuscular Once Delfin Gant, NP      . haloperidol lactate (HALDOL) 5 MG/ML injection           . haloperidol lactate (HALDOL) injection 5 mg  5 mg Intramuscular Once Delfin Gant, NP      . ibuprofen (ADVIL,MOTRIN) tablet 600 mg  600 mg Oral Q8H PRN Linus Mako, PA-C      . LORazepam (ATIVAN) 2 MG/ML injection           . LORazepam (ATIVAN) injection 2 mg  2 mg Intramuscular Once Delfin Gant, NP      . LORazepam (ATIVAN) tablet 1 mg  1 mg Oral Once Tiffany G Greene, PA-C      . nicotine (NICODERM CQ - dosed in mg/24 hours) patch 21 mg  21 mg Transdermal Daily Tiffany Marilu Favre, PA-C      . ondansetron (ZOFRAN) tablet 4 mg  4 mg Oral Q8H PRN Linus Mako, PA-C      . pantoprazole (PROTONIX) EC tablet 40 mg   40 mg Oral Daily Tiffany Marilu Favre, PA-C      . zolpidem (AMBIEN) tablet 5 mg  5 mg Oral QHS PRN Linus Mako, PA-C       Current Outpatient Prescriptions  Medication Sig Dispense Refill  . benztropine (COGENTIN) 1 MG tablet Take 1 tablet (1 mg total) by mouth 2 (  two) times daily in the am and at bedtime.. For EPS  30 tablet  0  . diazepam (VALIUM) 5 MG tablet Take 5 mg by mouth every morning.      . haloperidol decanoate (HALDOL DECANOATE) 100 MG/ML injection Inject 150 mg into the muscle every 28 (twenty-eight) days. Give one IM injection of $RemoveBefo'100mg'DZmPHqOMqTS$ .every month.  Last given 03/07/2012, next due 04/07/2012.      . pantoprazole (PROTONIX) 40 MG tablet Take 1 tablet (40 mg total) by mouth daily.  30 tablet  11  . SILENOR 6 MG TABS       . traZODone (DESYREL) 150 MG tablet Take 1 tablet (150 mg total) by mouth at bedtime. For sleep.  30 tablet  0  . [DISCONTINUED] haloperidol lactate (HALDOL) 5 MG/ML injection Inject 150 mg into the skin every 30 (thirty) days.      . [DISCONTINUED] zolpidem (AMBIEN) 5 MG tablet Take 5 mg by mouth at bedtime as needed.        Psychiatric Specialty Exam:     Blood pressure 185/88, pulse 98, temperature 99.1 F (37.3 C), temperature source Oral, resp. rate 20, SpO2 98.00%.There is no weight on file to calculate BMI.  General Appearance: Casual and Disheveled  Eye Contact::  Absent  Speech:  Slow and not much talking noted  Volume:  Decreased  Mood:  unable to obtain, patient rambles words occassionally  Affect:  Constricted, Depressed, Flat and Restricted  Thought Process:  Disorganized and unable to obtain much information  Orientation:  Other:  unable to obtain at this time  Thought Content:  unable to obtain at this time  Suicidal Thoughts:  No  Homicidal Thoughts:  No  Memory:  unable to obtain information at this time  Judgement:  Other:  unable to obtain information at this time  Insight:  Shallow and unable to determine insight  Psychomotor  Activity:  Normal  Concentration:  Poor  Recall:  unable to obtain  Akathisia:  NA  Handed:  Right  AIMS (if indicated):     Assets:  Others:  Unable to obtain, brought in by brother for Psychiatric care  Sleep:      Treatment Plan Summary:  Consult with  Dr De Nurse Patient will be admitted to our inpatient Psychiatric unit for safety and stabilization We will obtain list of medications from his mother and [facility] Meanwhile, patient will be medicated for Psychosis with plan to re-interview when he is stable enough to participate in the interview. Daily contact with patient to assess and evaluate symptoms and progress in treatment Medication management  Delfin Gant  PMHNP-BC 01/01/2014 3:17 PM  Case discussed with physician extender and agree with the treatment plan.

## 2014-01-01 NOTE — Progress Notes (Signed)
Patient restless, agitated. Majority of speech incomprehensible. Patient continues to leave his room and pace, yell out, and go to exit and swing/kick at the doors.  Encouragement offered. Redirection attempted. Ativan given.  Patient safety maintained, Q 15 checks continue.

## 2014-01-01 NOTE — ED Notes (Signed)
Patient wandering, restlessness appear to be improving. Patient encouraged to lie down and try to sleep multiple times. Patient in bed at present, remains agitated; responding to internal stimuli, shadow boxing. Patient safety maintained, Q 15 checks continue.

## 2014-01-01 NOTE — ED Notes (Signed)
Pt brought in by brother for medical clearance since he has a new doctor and medicine change which has caused the decline in behavior and insomnia for 4 days.  Pt ben here before about year ago per family. Pt's mother has POA but she is with another family member having surgery.  Pt not eating and very irritable. Pt been screaming and now hoarse from all the yelling.

## 2014-01-01 NOTE — ED Notes (Signed)
Patient attempted to swing at another patient that was entering the unit. Redirection given but remains psychotic and  behavior is erratic and unpredictable. Provider informed of increased BP. Orders acknowledged.

## 2014-01-01 NOTE — ED Provider Notes (Signed)
CSN: [id number]     Arrival date & time 01/01/14  1315 History  This chart was scribed for non-physician practitioner, Devona Konig, working with Houston Siren, MD by Celesta Gentile, ED Scribe. This patient was seen in room [facility] and the patient's care was started at 1:40 PM.    Chief Complaint  Patient presents with  . Medical Clearance  . Insomnia   Level 5 Caveat-Patient acutely psychotic The history is provided by a relative. No language interpreter was used.   HPI Comments: David [name] is a 58 y.o. male brought in by his brother and father to the Emergency Department needing medical clearance.  Brother states that the pt has new doctor that changed his regular medicines.  Brother states pt hasn't slept in about 4 days since the medicine changes.  Brother also reports that pt has been yelling random thoughts and is now hoarse from all of the yelling.  Brother states that pt will listen to him, but no one else. The pt mother is the POA.  Brother states he tried to take him to [facility], but is wasn't a successful trip.    Past Medical History  Diagnosis Date  . Schizophrenia   . Pneumonia   . Anxiety   . GERD (gastroesophageal reflux disease)   . COPD (chronic obstructive pulmonary disease)    Past Surgical History  Procedure Laterality Date  . Tonsillectomy     Family History  Problem Relation Age of Onset  . Arthritis Mother   . Hypertension Mother    History  Substance Use Topics  . Smoking status: Current Every Day Smoker -- 1.00 packs/day for 40 years    Types: Cigarettes  . Smokeless tobacco: Never Used  . Alcohol Use: No    Review of Systems  Unable to perform ROS: Psychiatric disorder    Allergies  Lithium  Home Medications   Current Outpatient Rx  Name  Route  Sig  Dispense  Refill  . EXPIRED: benztropine (COGENTIN) 1 MG tablet   Oral   Take 1 tablet (1 mg total) by mouth 2 (two) times daily in the am and at bedtime.. For  EPS   30 tablet   0   . diazepam (VALIUM) 5 MG tablet   Oral   Take 5 mg by mouth every morning.         . haloperidol decanoate (HALDOL DECANOATE) 100 MG/ML injection   Intramuscular   Inject 150 mg into the muscle every 28 (twenty-eight) days. Give one IM injection of 100mg .every month.  Last given 03/07/2012, next due 04/07/2012.         . pantoprazole (PROTONIX) 40 MG tablet   Oral   Take 1 tablet (40 mg total) by mouth daily.   30 tablet   11   . SILENOR 6 MG TABS               . EXPIRED: traZODone (DESYREL) 150 MG tablet   Oral   Take 1 tablet (150 mg total) by mouth at bedtime. For sleep.   30 tablet   0    Triage Vitals: BP 185/88  Pulse 114  Temp(Src) 99.1 F (37.3 C) (Oral)  Resp 20  SpO2 98% Physical Exam  Nursing note and vitals reviewed. Constitutional: He appears well-developed and well-nourished. No distress.  HENT:  Head: Normocephalic and atraumatic.  Eyes: EOM are normal.  Neck: Neck supple. No tracheal deviation present.  Cardiovascular: Normal rate.   Pulmonary/Chest:  Effort normal and breath sounds normal. No respiratory distress.  Musculoskeletal: Normal range of motion.  Neurological: He is alert.  Skin: Skin is warm and dry.  Psychiatric: His mood appears anxious. His affect is angry. He is agitated. Thought content is paranoid.    ED Course  Procedures (including critical care time) DIAGNOSTIC STUDIES: Oxygen Saturation is 98% on RA, normal by my interpretation.    COORDINATION OF CARE: 2:34 PM-Screening labs pending. Telepysch ordered.  Pt will be moved to psychiatric ED.  Will order Ativan.  Patient informed of current plan of treatment and evaluation and agrees with plan.    Labs Review Labs Reviewed  CBC - Abnormal; Notable for the following:    WBC 16.0 (*)    Platelets 133 (*)    All other components within normal limits  COMPREHENSIVE METABOLIC PANEL - Abnormal; Notable for the following:    Sodium 133 (*)    Chloride  95 (*)    Creatinine, Ser 1.39 (*)    AST 44 (*)    GFR calc non Af Amer 55 (*)    GFR calc Af Amer 64 (*)    All other components within normal limits  SALICYLATE LEVEL - Abnormal; Notable for the following:    Salicylate Lvl <2.7 (*)    All other components within normal limits  ACETAMINOPHEN LEVEL  ETHANOL  URINE RAPID DRUG SCREEN (HOSP PERFORMED)   Imaging Review No results found.  EKG Interpretation   None       MDM   1. Paranoid schizophrenia    1 mg PO Ativan ordered Patient has not been eating and drinking which is mildly reflected in his labs sodium is 133, lower range of normal is 137, I do not believe that he acutely needs IV hydration or nutrition. I have ordered a fluid challenge and diet. If patient continues to refuse to eat then he may need parenteral nutrition but family would like to try doing it PO for now because pt will get very agitated. I feel this is reasonable. Vitals are stable, pulse originally 114 but after patient has had time to rest it is 98 bpm.   Telepsych ordered for medication adjustment Will most likely need inpatient for stabilization Order placed to move to Psych ED  I personally performed the services described in this documentation, which was scribed in my presence. The recorded information has been reviewed and is accurate.     Linus Mako, PA-C 01/01/14 1438

## 2014-01-01 NOTE — ED Provider Notes (Signed)
Medical screening examination/treatment/procedure(s) were performed by non-physician practitioner and as supervising physician I was immediately available for consultation/collaboration.  EKG Interpretation   None         Houston Siren, MD 01/01/14 1705

## 2014-01-02 ENCOUNTER — Emergency Department (HOSPITAL_COMMUNITY): Payer: Medicare Other

## 2014-01-02 DIAGNOSIS — F200 Paranoid schizophrenia: Secondary | ICD-10-CM | POA: Diagnosis not present

## 2014-01-02 DIAGNOSIS — F201 Disorganized schizophrenia: Secondary | ICD-10-CM | POA: Diagnosis not present

## 2014-01-02 MED ORDER — FOLIC ACID 1 MG PO TABS
1.0000 mg | ORAL_TABLET | Freq: Every day | ORAL | Status: DC
  Administered 2014-01-02 – 2014-01-05 (×4): 1 mg via ORAL
  Filled 2014-01-02 (×4): qty 1

## 2014-01-02 MED ORDER — VALPROIC ACID 250 MG/5ML PO SYRP
250.0000 mg | ORAL_SOLUTION | Freq: Two times a day (BID) | ORAL | Status: DC
  Administered 2014-01-02 – 2014-01-05 (×7): 250 mg via ORAL
  Filled 2014-01-02 (×8): qty 5

## 2014-01-02 MED ORDER — DIPHENHYDRAMINE HCL 50 MG/ML IJ SOLN
100.0000 mg | Freq: Once | INTRAMUSCULAR | Status: AC
  Administered 2014-01-02: 100 mg via INTRAMUSCULAR
  Filled 2014-01-02: qty 2

## 2014-01-02 MED ORDER — DIPHENHYDRAMINE HCL 50 MG/ML IJ SOLN
50.0000 mg | Freq: Once | INTRAMUSCULAR | Status: AC
  Administered 2014-01-02: 50 mg via INTRAMUSCULAR

## 2014-01-02 MED ORDER — HALOPERIDOL LACTATE 5 MG/ML IJ SOLN
INTRAMUSCULAR | Status: AC
  Filled 2014-01-02: qty 2

## 2014-01-02 MED ORDER — ZIPRASIDONE MESYLATE 20 MG IM SOLR
20.0000 mg | Freq: Once | INTRAMUSCULAR | Status: AC
  Administered 2014-01-02: 20 mg via INTRAMUSCULAR
  Filled 2014-01-02: qty 20

## 2014-01-02 MED ORDER — LORAZEPAM 2 MG/ML IJ SOLN
2.0000 mg | Freq: Once | INTRAMUSCULAR | Status: AC
  Administered 2014-01-02: 2 mg via INTRAMUSCULAR

## 2014-01-02 MED ORDER — HALOPERIDOL LACTATE 5 MG/ML IJ SOLN
10.0000 mg | Freq: Once | INTRAMUSCULAR | Status: AC
  Administered 2014-01-02: 10 mg via INTRAMUSCULAR

## 2014-01-02 MED ORDER — HALOPERIDOL LACTATE 5 MG/ML IJ SOLN
5.0000 mg | Freq: Once | INTRAMUSCULAR | Status: AC
  Administered 2014-01-02: 5 mg via INTRAMUSCULAR
  Filled 2014-01-02: qty 1

## 2014-01-02 MED ORDER — BENZTROPINE MESYLATE 1 MG PO TABS
1.0000 mg | ORAL_TABLET | Freq: Two times a day (BID) | ORAL | Status: DC
  Administered 2014-01-02 – 2014-01-05 (×7): 1 mg via ORAL
  Filled 2014-01-02 (×7): qty 1

## 2014-01-02 MED ORDER — LORAZEPAM 2 MG/ML IJ SOLN
INTRAMUSCULAR | Status: AC
  Administered 2014-01-02: 09:00:00 2 mg via INTRAMUSCULAR
  Filled 2014-01-02: qty 1

## 2014-01-02 MED ORDER — VITAMIN B-1 100 MG PO TABS
100.0000 mg | ORAL_TABLET | Freq: Every day | ORAL | Status: DC
  Administered 2014-01-02 – 2014-01-05 (×4): 100 mg via ORAL
  Filled 2014-01-02 (×4): qty 1

## 2014-01-02 MED ORDER — LORAZEPAM 2 MG/ML IJ SOLN
2.0000 mg | Freq: Once | INTRAMUSCULAR | Status: AC
  Administered 2014-01-02: 2 mg via INTRAMUSCULAR
  Filled 2014-01-02: qty 1

## 2014-01-02 MED ORDER — CLONIDINE HCL 0.1 MG PO TABS
0.2000 mg | ORAL_TABLET | Freq: Once | ORAL | Status: AC
  Administered 2014-01-02: 0.2 mg via ORAL
  Filled 2014-01-02: qty 2

## 2014-01-02 MED ORDER — HALOPERIDOL 5 MG PO TABS
10.0000 mg | ORAL_TABLET | Freq: Two times a day (BID) | ORAL | Status: DC
  Administered 2014-01-02 – 2014-01-03 (×4): 10 mg via ORAL
  Filled 2014-01-02 (×6): qty 2

## 2014-01-02 NOTE — ED Notes (Signed)
Dr Tanna Furry initiated involuntary at 1740 after seeing pt . Continued to be loud and disruptive requires much supervision and direction. Pt is drowsy but doesn't want to sleep.

## 2014-01-02 NOTE — ED Provider Notes (Signed)
Called to see patient regarding increased agitation.  He has attempted to destroy anything he can possibly remove her from the furniture in his room.  He is being observed in multiple officers.  He is sitting in his bed.  He has a mouth full of macaroni and cheese.  When I attempt to talk to him he will not swallow or remove the food from his mouth.  He shakes his head "no"  that he will not stop throwing and hitting things.  He does state that he is hearing things.  He points to the television.  His last  Blood Pressure was much improved at 122/.  I have ordered Ativan and Geodon IM for his agitation.  He remains a risk to hurt himself or others with his agitated, and physically violent outbursts.  I have completed and submitted an IVC.  Tanna Furry, MD 01/02/14 [phone]

## 2014-01-02 NOTE — ED Notes (Signed)
Patient alert, up and yelling. Redirected, encouraged to go back to sleep.  Patient safety maintained, Q 15 checks continue.

## 2014-01-02 NOTE — ED Notes (Signed)
Pt has been labile throughout the day, voice hoarse from yelling at staff going in and out of pt's room needing frequent redirection. Took medications without difficulty however found pt spitting up in bathroom. Denies spitting up meds.Staff needs to monitor pt continuously due to his noncompliance with following direction trying the doors, yelling for cigarettes. Pt did fall asleep at 1:30

## 2014-01-02 NOTE — ED Notes (Signed)
Pt agitated refuses to follow redirection,Screeching at staff making bowling hand gestures towards staff. Seen by Dr. Betsey Holiday Medicated with Haldol and Ativan IM.

## 2014-01-02 NOTE — Progress Notes (Signed)
Per [facility] David [name]) Patient is on a medical delay due to his high blood pressure.

## 2014-01-02 NOTE — ED Notes (Signed)
Patient presents restless, constantly pacing the halls and jumping out of bed, screaming out and irritable. Denies any pain; mildly confused, acting as if he is riding a horse down the hall, easily redirectable and compliant with medication administration. Patient still has not sleep. NAD

## 2014-01-02 NOTE — ED Notes (Signed)
Patient continued to fight sleep. Redirected to bed. Encouragement and continued redirection offered. Patient sleeping with respirations even and unlabored by 0415.  Patient safety maintained, Q 15 checks continue.

## 2014-01-02 NOTE — ED Provider Notes (Signed)
Called to evaluate the patient for increased agitation. Patient encountered in the hallway, pacing from one end to the other. He is extremely agitated. Administer Haldol 10 mg IM, Ativan 2 mg IM. Nursing staff also reported the patient's blood pressure is elevated. He did receive Norvasc yesterday for elevated blood pressure, has a history of chronic hypertension. Will hold off on treating at this point, as she may significantly improve after Haldol and Ativan. Will restart treatment if his pressure does not improve with decreased agitation.  Patient has reportedly punched a wall. Does not have any current complaints, but is noted to have some swelling of the right hand. No open wounds. Will x-ray hand.  Orpah Greek, MD 01/02/14 [phone]

## 2014-01-02 NOTE — ED Notes (Signed)
Patient remains awake, agitated. Patient yelling and directing comments to staff and appears to also be yelling to himself/internal stimuli. Continues to get up from bed/his chair and pace the halls.  Patient safety maintained, Q 15 checks continue.

## 2014-01-02 NOTE — ED Notes (Signed)
Patient appears to be sleeping, respirations even and unlabored. Q 15 checks continue.

## 2014-01-02 NOTE — Consult Note (Signed)
[facility] Face-to-Face Psychiatry Consult   Reason for Consult:  Psychosis Referring Physician:  EDP David [name] is an 58 y.o. male.  Assessment: AXIS I:  Schizophrenia, disorganized AXIS II:  Deferred AXIS [name]:   Past Medical History  Diagnosis Date  . Schizophrenia   . Pneumonia   . Anxiety   . GERD (gastroesophageal reflux disease)   . COPD (chronic obstructive pulmonary disease)    AXIS IV:  other psychosocial or environmental problems and problems related to social environment AXIS V:  21-30 behavior considerably influenced by delusions or hallucinations OR serious impairment in judgment, communication OR inability to function in almost all areas  Plan:  Recommend psychiatric Inpatient admission when medically cleared.  Subjective:   David [name] is a 58 y.o. male patient admitted with Schizophrenia, disorganized, Psychosis.  HPI:  Patient is seen and chart reviewed and case discussed with physician extender. David [name] is a 58 y.o. male brought in by his brother for disorganized and paranoid schizophrenia. Brother states that the pt has new doctor that changed his regular medicines. Brother states pt hasn't slept in about 4 days since the medicine changes. Brother also reports that pt has been yelling random thoughts and is now hoarse from all of the yelling. Brother states that pt will listen to him, but no one else. The pt mother is the POA and her number is [phone]. Brother states patient sees a Teacher, music at [employer] in [employer]. Patient lives alone and he has not been eating well.  Patient, during this interview is unable to answer any question.  He intermittently gets up and moves his legs as if he is in [employer] class.  Patient brother David [name] gave collateral information and his phone number [phone].  Patient will be medicated and allowed to rest.  We will retry interviewing patient when he can participate in the interview.    HPI Elements:    Location:  [facility]. Quality:  SEVERE, PATIENT PSYCHOTIC, DISORIENTED. Severity:  SEVERE.  Past Psychiatric History: Past Medical History  Diagnosis Date  . Schizophrenia   . Pneumonia   . Anxiety   . GERD (gastroesophageal reflux disease)   . COPD (chronic obstructive pulmonary disease)     reports that he has been smoking Cigarettes.  He has a 40 pack-year smoking history. He has never used smokeless tobacco. He reports that he does not drink alcohol or use illicit drugs. Family History  Problem Relation Age of Onset  . Arthritis Mother   . Hypertension Mother            Allergies:   Allergies  Allergen Reactions  . Lithium Nausea Only    "get's very sick"    ACT Assessment Complete:  No:   Past Psychiatric History: Diagnosis:  Schizophrenia, disorganized  Hospitalizations:  [facility] 2013  Outpatient Care:  [facility]  Substance Abuse Care:  In the 1970s  Self-Mutilation:  Brother denies  Suicidal Attempts:  OD IN 1970S  Homicidal Behaviors:  Brother denies   Violent Behaviors: brother denies   Place of Residence:  [place], [place] Marital Status:  Divorced Employed/Unemployed:  umemployed Education:  High school diploma Family Supports:  yes Objective: Blood pressure 165/95, pulse 102, temperature 97.5 F (36.4 C), temperature source Oral, resp. rate 21, SpO2 95.00%.There is no weight on file to calculate BMI. Results for orders placed during the hospital encounter of 01/01/14 (from the past 72 hour(s))  ACETAMINOPHEN LEVEL     Status: None  Collection Time    01/01/14  1:50 PM      Result Value Range   Acetaminophen (Tylenol), Serum <15.0  10 - 30 ug/mL   Comment:            THERAPEUTIC CONCENTRATIONS VARY     SIGNIFICANTLY. A RANGE OF 10-30     ug/mL MAY BE AN EFFECTIVE     CONCENTRATION FOR MANY PATIENTS.     HOWEVER, SOME ARE BEST TREATED     AT CONCENTRATIONS OUTSIDE THIS     RANGE.     ACETAMINOPHEN CONCENTRATIONS     >150 ug/mL AT 4 HOURS  AFTER     INGESTION AND >50 ug/mL AT 12     HOURS AFTER INGESTION ARE     OFTEN ASSOCIATED WITH TOXIC     REACTIONS.  CBC     Status: Abnormal   Collection Time    01/01/14  1:50 PM      Result Value Range   WBC 16.0 (*) 4.0 - 10.5 K/uL   RBC 5.23  4.22 - 5.81 MIL/uL   Hemoglobin 14.8  13.0 - 17.0 g/dL   HCT 44.2  39.0 - 52.0 %   MCV 84.5  78.0 - 100.0 fL   MCH 28.3  26.0 - 34.0 pg   MCHC 33.5  30.0 - 36.0 g/dL   RDW 15.3  11.5 - 15.5 %   Platelets 133 (*) 150 - 400 K/uL  COMPREHENSIVE METABOLIC PANEL     Status: Abnormal   Collection Time    01/01/14  1:50 PM      Result Value Range   Sodium 133 (*) 137 - 147 mEq/L   Comment: Please note change in reference range.   Potassium 4.2  3.7 - 5.3 mEq/L   Comment: Please note change in reference range.   Chloride 95 (*) 96 - 112 mEq/L   CO2 23  19 - 32 mEq/L   Glucose, Bld 97  70 - 99 mg/dL   BUN 9  6 - 23 mg/dL   Creatinine, Ser 1.39 (*) 0.50 - 1.35 mg/dL   Calcium 9.4  8.4 - 10.5 mg/dL   Total Protein 7.1  6.0 - 8.3 g/dL   Albumin 3.7  3.5 - 5.2 g/dL   AST 44 (*) 0 - 37 U/L   ALT 25  0 - 53 U/L   Alkaline Phosphatase 93  39 - 117 U/L   Total Bilirubin 0.6  0.3 - 1.2 mg/dL   GFR calc non Af Amer 55 (*) >90 mL/min   GFR calc Af Amer 64 (*) >90 mL/min   Comment: (NOTE)     The eGFR has been calculated using the CKD EPI equation.     This calculation has not been validated in all clinical situations.     eGFR's persistently <90 mL/min signify possible Chronic Kidney     Disease.  ETHANOL     Status: None   Collection Time    01/01/14  1:50 PM      Result Value Range   Alcohol, Ethyl (B) <11  0 - 11 mg/dL   Comment:            LOWEST DETECTABLE LIMIT FOR     SERUM ALCOHOL IS 11 mg/dL     FOR MEDICAL PURPOSES ONLY  SALICYLATE LEVEL     Status: Abnormal   Collection Time    01/01/14  1:50 PM      Result Value Range  Salicylate Lvl <6.6 (*) 2.8 - 20.0 mg/dL  URINE RAPID DRUG SCREEN (HOSP PERFORMED)     Status: None    Collection Time    01/01/14  2:31 PM      Result Value Range   Opiates NONE DETECTED  NONE DETECTED   Cocaine NONE DETECTED  NONE DETECTED   Benzodiazepines NONE DETECTED  NONE DETECTED   Amphetamines NONE DETECTED  NONE DETECTED   Tetrahydrocannabinol NONE DETECTED  NONE DETECTED   Barbiturates NONE DETECTED  NONE DETECTED   Comment:            DRUG SCREEN FOR MEDICAL PURPOSES     ONLY.  IF CONFIRMATION IS NEEDED     FOR ANY PURPOSE, NOTIFY LAB     WITHIN 5 DAYS.                LOWEST DETECTABLE LIMITS     FOR URINE DRUG SCREEN     Drug Class       Cutoff (ng/mL)     Amphetamine      1000     Barbiturate      200     Benzodiazepine   599     Tricyclics       357     Opiates          300     Cocaine          300     THC              50   Labs are reviewed and are pertinent for abnormal CBC and CMP results.  .  Current Facility-Administered Medications  Medication Dose Route Frequency Provider Last Rate Last Dose  . acetaminophen (TYLENOL) tablet 650 mg  650 mg Oral Q4H PRN Linus Mako, PA-C      . benztropine (COGENTIN) tablet 1 mg  1 mg Oral BID Delfin Gant, NP   1 mg at 01/02/14 1200  . diazepam (VALIUM) tablet 5 mg  5 mg Oral q morning - 10a Houston Siren, MD   5 mg at 01/02/14 0912  . folic acid (FOLVITE) tablet 1 mg  1 mg Oral Daily Delfin Gant, NP   1 mg at 01/02/14 1201  . haloperidol (HALDOL) tablet 10 mg  10 mg Oral BID Delfin Gant, NP   10 mg at 01/02/14 1153  . haloperidol lactate (HALDOL) 5 MG/ML injection           . ibuprofen (ADVIL,MOTRIN) tablet 600 mg  600 mg Oral Q8H PRN Linus Mako, PA-C      . LORazepam (ATIVAN) tablet 1 mg  1 mg Oral Once Tiffany G Greene, PA-C      . nicotine (NICODERM CQ - dosed in mg/24 hours) patch 21 mg  21 mg Transdermal Daily Linus Mako, PA-C   21 mg at 01/02/14 0913  . ondansetron (ZOFRAN) tablet 4 mg  4 mg Oral Q8H PRN Linus Mako, PA-C   4 mg at 01/02/14 1153  . pantoprazole  (PROTONIX) EC tablet 40 mg  40 mg Oral Daily Linus Mako, PA-C   40 mg at 01/02/14 0913  . thiamine (VITAMIN B-1) tablet 100 mg  100 mg Oral Daily Delfin Gant, NP   100 mg at 01/02/14 1202  . Valproic Acid (DEPAKENE) 250 MG/5ML syrup SYRP 250 mg  250 mg Oral BID Delfin Gant, NP   250 mg at 01/02/14 1159  .  zolpidem (AMBIEN) tablet 5 mg  5 mg Oral QHS PRN Linus Mako, PA-C   5 mg at 01/01/14 2159   Current Outpatient Prescriptions  Medication Sig Dispense Refill  . benztropine (COGENTIN) 1 MG tablet Take 1 mg by mouth 2 (two) times daily.      . diazepam (VALIUM) 2 MG tablet Take 2 mg by mouth every morning.      . haloperidol decanoate (HALDOL DECANOATE) 100 MG/ML injection Inject 150 mg into the muscle every 28 (twenty-eight) days. Give one IM injection of $RemoveBefo'100mg'AdZhowMOpKI$ .every month.  Last given 03/07/2012, next due 04/07/2012.      . Multiple Vitamin (MULTIVITAMIN WITH MINERALS) TABS tablet Take 1 tablet by mouth every morning.      . pantoprazole (PROTONIX) 40 MG tablet Take 40 mg by mouth daily.      [name] SILENOR 6 MG TABS Take 1 tablet by mouth at bedtime.       . [DISCONTINUED] haloperidol lactate (HALDOL) 5 MG/ML injection Inject 150 mg into the skin every 30 (thirty) days.      . [DISCONTINUED] zolpidem (AMBIEN) 5 MG tablet Take 5 mg by mouth at bedtime as needed.        Psychiatric Specialty Exam:     Blood pressure 165/95, pulse 102, temperature 97.5 F (36.4 C), temperature source Oral, resp. rate 21, SpO2 95.00%.There is no weight on file to calculate BMI.  General Appearance: Casual and Disheveled  Eye Contact::  Absent  Speech:  Slow and not much talking noted  Volume:  Decreased  Mood:  unable to obtain, patient rambles words occassionally  Affect:  Constricted, Depressed, Flat and Restricted  Thought Process:  Disorganized and unable to obtain much information  Orientation:  Other:  unable to obtain at this time  Thought Content:  unable to obtain at this time   Suicidal Thoughts:  No  Homicidal Thoughts:  No  Memory:  unable to obtain information at this time  Judgement:  Other:  unable to obtain information at this time  Insight:  Shallow and unable to determine insight  Psychomotor Activity:  Normal  Concentration:  Poor  Recall:  unable to obtain  Akathisia:  NA  Handed:  Right  AIMS (if indicated):     Assets:  Others:  Unable to obtain, brought in by brother for Psychiatric care  Sleep:      Treatment Plan Summary:   Case discussed with physician extender  Recommend admission to inpatient Psychiatric unit for safety and stabilization Will obtain list of medications from his mother who is POA and [facility] [place] current medication for Psychosis   Daily contact with patient to assess and evaluate symptoms and progress in treatment Medication management  Ardena Gangl,JANARDHAHA R. 01/02/2014 3:05 PM

## 2014-01-02 NOTE — ED Notes (Signed)
Patient up, pacing unit, entered another patient's room. Redirected, encouragement offered. Back in his room, yelling.  Patient safety maintained, Q 15 checks continue.

## 2014-01-03 ENCOUNTER — Encounter (HOSPITAL_COMMUNITY): Payer: Self-pay | Admitting: Registered Nurse

## 2014-01-03 ENCOUNTER — Emergency Department (HOSPITAL_COMMUNITY): Payer: Medicare Other

## 2014-01-03 DIAGNOSIS — F200 Paranoid schizophrenia: Secondary | ICD-10-CM | POA: Diagnosis not present

## 2014-01-03 DIAGNOSIS — Z008 Encounter for other general examination: Secondary | ICD-10-CM | POA: Diagnosis not present

## 2014-01-03 DIAGNOSIS — F201 Disorganized schizophrenia: Secondary | ICD-10-CM | POA: Diagnosis not present

## 2014-01-03 DIAGNOSIS — Z0000 Encounter for general adult medical examination without abnormal findings: Secondary | ICD-10-CM | POA: Diagnosis not present

## 2014-01-03 LAB — URINALYSIS, ROUTINE W REFLEX MICROSCOPIC
Bilirubin Urine: NEGATIVE
Glucose, UA: NEGATIVE mg/dL
HGB URINE DIPSTICK: NEGATIVE
KETONES UR: NEGATIVE mg/dL
Leukocytes, UA: NEGATIVE
Nitrite: NEGATIVE
PROTEIN: NEGATIVE mg/dL
Specific Gravity, Urine: 1.006 (ref 1.005–1.030)
UROBILINOGEN UA: 0.2 mg/dL (ref 0.0–1.0)
pH: 6 (ref 5.0–8.0)

## 2014-01-03 LAB — CBC WITH DIFFERENTIAL/PLATELET
BASOS ABS: 0.1 10*3/uL (ref 0.0–0.1)
BASOS PCT: 1 % (ref 0–1)
EOS ABS: 1.6 10*3/uL — AB (ref 0.0–0.7)
EOS PCT: 13 % — AB (ref 0–5)
HEMATOCRIT: 38.7 % — AB (ref 39.0–52.0)
Hemoglobin: 12.7 g/dL — ABNORMAL LOW (ref 13.0–17.0)
LYMPHS PCT: 20 % (ref 12–46)
Lymphs Abs: 2.4 10*3/uL (ref 0.7–4.0)
MCH: 27.8 pg (ref 26.0–34.0)
MCHC: 32.8 g/dL (ref 30.0–36.0)
MCV: 84.7 fL (ref 78.0–100.0)
MONO ABS: 1 10*3/uL (ref 0.1–1.0)
Monocytes Relative: 9 % (ref 3–12)
Neutro Abs: 6.9 10*3/uL (ref 1.7–7.7)
Neutrophils Relative %: 57 % (ref 43–77)
PLATELETS: 175 10*3/uL (ref 150–400)
RBC: 4.57 MIL/uL (ref 4.22–5.81)
RDW: 15.4 % (ref 11.5–15.5)
WBC: 12 10*3/uL — AB (ref 4.0–10.5)

## 2014-01-03 MED ORDER — ZIPRASIDONE MESYLATE 20 MG IM SOLR
20.0000 mg | Freq: Once | INTRAMUSCULAR | Status: AC
  Administered 2014-01-03: 20 mg via INTRAMUSCULAR
  Filled 2014-01-03: qty 20

## 2014-01-03 MED ORDER — LORAZEPAM 2 MG/ML IJ SOLN
2.0000 mg | Freq: Once | INTRAMUSCULAR | Status: AC
  Administered 2014-01-03: 2 mg via INTRAMUSCULAR
  Filled 2014-01-03: qty 1

## 2014-01-03 MED ORDER — DIPHENHYDRAMINE HCL 25 MG PO CAPS
50.0000 mg | ORAL_CAPSULE | Freq: Once | ORAL | Status: AC
  Administered 2014-01-04: 50 mg via ORAL
  Filled 2014-01-03: qty 2

## 2014-01-03 MED ORDER — DIPHENHYDRAMINE HCL 50 MG/ML IJ SOLN
25.0000 mg | Freq: Once | INTRAMUSCULAR | Status: AC
  Administered 2014-01-03: 25 mg via INTRAMUSCULAR
  Filled 2014-01-03: qty 1

## 2014-01-03 NOTE — BH Assessment (Signed)
[place] [place] Assessment Progress Note   Called [facility] and left message in attempt to place pt @ 1410.

## 2014-01-03 NOTE — Progress Notes (Addendum)
Placed call to [facility] and communicated with Remo Lipps the need for admission for this pt d/t his violent outbursts, aggression, and destruction of property.  Application for [facility] has been initiated.   UPDATE: [place] application has been completed and LME called for authorization number. Per [facility] auth# [id number] good 1.4.2015-1.10.2015.  Referral along with application has been faxed.  1339 Received phone call back from [place] at [facility] regarding pt's referral, referral has been received and pt is on now on wait list.  Did request paper work showing proof of mom being [place].    Rick Duff, MHT

## 2014-01-03 NOTE — ED Notes (Signed)
Patient presents pleasant and cooperative currently. Still is on a 1:1. NAD

## 2014-01-03 NOTE — BH Assessment (Signed)
Received call from [place] at [facility] who said Pt will be reviewed for admission to their facility Monday morning.  Orpah Greek Rosana Hoes, [facility] Triage Specialist

## 2014-01-03 NOTE — Progress Notes (Addendum)
The following facilities have been called in an attempt to place pt for intptx:  [facility]- per Bethel Born currently no beds but can fax and he could possibly be put on the wait list [facility]- per Tam[place] have acute beds available and can fax [facility]- per Minette Brine, at [place]- per Erasmo Downer, at capacity [facility]- per Denny Peon, at [place] per [place], at [place]- per Hinton Dyer have acute beds available and can fax [place]- per Hassan Rowan, at [employer] [facility]- per [place], at [facility]- per [place], at [employer]- per [place], at [employer]- at capacity [facility]- per Juliann Pulse, have acute beds available and can fax Linus Orn- per Sharee Pimple, at [place]- per Izora Gala, could fax for MD to review Mikel Cella- per Lonn [place], no beds available   Rick Duff, MHT

## 2014-01-03 NOTE — Progress Notes (Addendum)
Received phone call back from [place] at [facility] stating that has been declined by Dr. Franchot Mimes d/t acuity.  UPDATE: Follow-up calls were placed to the following  [facility]- per Hinton Dyer pt declined by Dr. Serena Croissant d/t aggression [facility]- per Izora Gala did not receive, will refax [facility]- per Judson Roch, referral has been received but has not been reviewed   Rick Duff, MHT

## 2014-01-03 NOTE — ED Notes (Signed)
Escalating behaviors-cursing, yelling, hollering at staff and [employer]. Punching fists on counter at nurses station. Intermittent periods of yelling then crying, laughing. Very labile. Dr. Eulis Foster notified-orders received and initiated.

## 2014-01-03 NOTE — ED Provider Notes (Signed)
David [name] is a 58 y.o. male who is being evaluated for acute psychotic behavior. He has been evaluated remotely by an outlying facility, who is requesting an antibiotic, for his elevated white blood cell count. Review of records indicate that he is frequently had elevated white blood cell counts in this range. He has been intermittently psychotic requiring IM medications. So far his evaluation has not shown a source for infection. I was contacted by the nurse at 14:30 requesting repeat. IM medication, for his agitation. Yesterday, he received IM medications for the same. We will repeat these doses and add Benadryl, IM.  I will add additional screening test of urinalysis, and chest x-ray to look for signs of infection. , And repeat his CBC.  His white blood cell count may be elevated because of agitation.   CXR, Right hand images normal.  Awaiting U/A and CBC  Richarda Blade, MD 01/03/14 [phone]

## 2014-01-03 NOTE — ED Notes (Signed)
Spoke with Dr. Eulis Foster regarding antibiotic, stated he would review the chart.

## 2014-01-03 NOTE — ED Notes (Signed)
Pt is sleeping, VS was not obtained at this time, RN notified

## 2014-01-03 NOTE — Progress Notes (Signed)
Placed call to [facility] psych ED asking if there was paper work showing that mom is indeed POA, spoke with MHT working on unit and currently there is no paper work showing that mom is POA.  Placed call to mom David [name] [phone] to request POA paper work, stated that she has brought paper work before to both [facility] and [employer].  Will check chart.   Gwendel Hanson, MHT

## 2014-01-03 NOTE — ED Notes (Addendum)
[facility] called requesting antibiotic be started for WBC of 16.0 and they will re-evaluate tomorrow.

## 2014-01-03 NOTE — Consult Note (Signed)
[facility] Face-to-Face Psychiatry Consult   Reason for Consult:  Psychosis Referring Physician:  EDP [name] is an 58 y.o. male.  Assessment: AXIS I:  Schizophrenia, disorganized AXIS II:  Deferred AXIS [name]:   Past Medical History  Diagnosis Date  . Schizophrenia   . Pneumonia   . Anxiety   . GERD (gastroesophageal reflux disease)   . COPD (chronic obstructive pulmonary disease)    AXIS IV:  other psychosocial or environmental problems and problems related to social environment AXIS V:  21-30 behavior considerably influenced by delusions or hallucinations OR serious impairment in judgment, communication OR inability to function in almost all areas  Plan:  Recommend psychiatric Inpatient admission when medically cleared.  Subjective:   David [name] is a 58 y.o. male patient admitted with Schizophrenia, disorganized, Psychosis.  HPI:  Patient is seen and chart reviewed and case discussed with physician extender. Staff reported that he has been easily getting upset and combative and required security to be called to calm him down this morning. Patient stated that he is tired of being in hospital and answering questions. He continues to be disorganized and paranoid schizophrenia. Reportedly he has medication changes at [facility] recovery center and than his behaviors changed and got worse as per his brother. Patient has hard time sleep since medication changes. Patient is known for disorganized behaviors, screenmng, yelling and lost his voice.  His mother is the POA and her number is [phone]. Patient lives alone and he has not been eating well.  Patient, during this interview is unable to answer any question.  He intermittently gets up and moves his legs as if he is in [place] class.  Patient brother Shiela Mayer gave collateral information and his phone number [phone].  Patient is not safe to be discharged at this time.     HPI Elements:   Location:  [facility]. Quality:  SEVERE,  PATIENT PSYCHOTIC, DISORIENTED. Severity:  SEVERE.  Past Psychiatric History: Past Medical History  Diagnosis Date  . Schizophrenia   . Pneumonia   . Anxiety   . GERD (gastroesophageal reflux disease)   . COPD (chronic obstructive pulmonary disease)     reports that he has been smoking Cigarettes.  He has a 40 pack-year smoking history. He has never used smokeless tobacco. He reports that he does not drink alcohol or use illicit drugs. Family History  Problem Relation Age of Onset  . Arthritis Mother   . Hypertension Mother            Allergies:   Allergies  Allergen Reactions  . Lithium Nausea Only    "get's very sick"  . Trazodone And Nefazodone Nausea And Vomiting    ACT Assessment Complete:  No:   Past Psychiatric History: Diagnosis:  Schizophrenia, disorganized  Hospitalizations:  [facility] 2013  Outpatient Care:  [facility]  Substance Abuse Care:  In the 1970s  Self-Mutilation:  Brother denies  Suicidal Attempts:  OD IN 1970S  Homicidal Behaviors:  Brother denies   Violent Behaviors: brother denies   Place of Residence:  [place], [place] Marital Status:  Divorced Employed/Unemployed:  umemployed Education:  High school diploma Family Supports:  yes Objective: Blood pressure 97/60, pulse 101, temperature 97.8 F (36.6 C), temperature source Oral, resp. rate 20, SpO2 96.00%.There is no weight on file to calculate BMI. Results for orders placed during the hospital encounter of 01/01/14 (from the past 72 hour(s))  ACETAMINOPHEN LEVEL     Status: None   Collection Time  01/01/14  1:50 PM      Result Value Range   Acetaminophen (Tylenol), Serum <15.0  10 - 30 ug/mL   Comment:            THERAPEUTIC CONCENTRATIONS VARY     SIGNIFICANTLY. A RANGE OF 10-30     ug/mL MAY BE AN EFFECTIVE     CONCENTRATION FOR MANY PATIENTS.     HOWEVER, SOME ARE BEST TREATED     AT CONCENTRATIONS OUTSIDE THIS     RANGE.     ACETAMINOPHEN CONCENTRATIONS     >150 ug/mL AT 4  HOURS AFTER     INGESTION AND >50 ug/mL AT 12     HOURS AFTER INGESTION ARE     OFTEN ASSOCIATED WITH TOXIC     REACTIONS.  CBC     Status: Abnormal   Collection Time    01/01/14  1:50 PM      Result Value Range   WBC 16.0 (*) 4.0 - 10.5 K/uL   RBC 5.23  4.22 - 5.81 MIL/uL   Hemoglobin 14.8  13.0 - 17.0 g/dL   HCT 44.2  39.0 - 52.0 %   MCV 84.5  78.0 - 100.0 fL   MCH 28.3  26.0 - 34.0 pg   MCHC 33.5  30.0 - 36.0 g/dL   RDW 15.3  11.5 - 15.5 %   Platelets 133 (*) 150 - 400 K/uL  COMPREHENSIVE METABOLIC PANEL     Status: Abnormal   Collection Time    01/01/14  1:50 PM      Result Value Range   Sodium 133 (*) 137 - 147 mEq/L   Comment: Please note change in reference range.   Potassium 4.2  3.7 - 5.3 mEq/L   Comment: Please note change in reference range.   Chloride 95 (*) 96 - 112 mEq/L   CO2 23  19 - 32 mEq/L   Glucose, Bld 97  70 - 99 mg/dL   BUN 9  6 - 23 mg/dL   Creatinine, Ser 1.39 (*) 0.50 - 1.35 mg/dL   Calcium 9.4  8.4 - 10.5 mg/dL   Total Protein 7.1  6.0 - 8.3 g/dL   Albumin 3.7  3.5 - 5.2 g/dL   AST 44 (*) 0 - 37 U/L   ALT 25  0 - 53 U/L   Alkaline Phosphatase 93  39 - 117 U/L   Total Bilirubin 0.6  0.3 - 1.2 mg/dL   GFR calc non Af Amer 55 (*) >90 mL/min   GFR calc Af Amer 64 (*) >90 mL/min   Comment: (NOTE)     The eGFR has been calculated using the CKD EPI equation.     This calculation has not been validated in all clinical situations.     eGFR's persistently <90 mL/min signify possible Chronic Kidney     Disease.  ETHANOL     Status: None   Collection Time    01/01/14  1:50 PM      Result Value Range   Alcohol, Ethyl (B) <11  0 - 11 mg/dL   Comment:            LOWEST DETECTABLE LIMIT FOR     SERUM ALCOHOL IS 11 mg/dL     FOR MEDICAL PURPOSES ONLY  SALICYLATE LEVEL     Status: Abnormal   Collection Time    01/01/14  1:50 PM      Result Value Range   Salicylate Lvl <3.4 (*) 2.8 -  20.0 mg/dL  URINE RAPID DRUG SCREEN (HOSP PERFORMED)     Status:  None   Collection Time    01/01/14  2:31 PM      Result Value Range   Opiates NONE DETECTED  NONE DETECTED   Cocaine NONE DETECTED  NONE DETECTED   Benzodiazepines NONE DETECTED  NONE DETECTED   Amphetamines NONE DETECTED  NONE DETECTED   Tetrahydrocannabinol NONE DETECTED  NONE DETECTED   Barbiturates NONE DETECTED  NONE DETECTED   Comment:            DRUG SCREEN FOR MEDICAL PURPOSES     ONLY.  IF CONFIRMATION IS NEEDED     FOR ANY PURPOSE, NOTIFY LAB     WITHIN 5 DAYS.                LOWEST DETECTABLE LIMITS     FOR URINE DRUG SCREEN     Drug Class       Cutoff (ng/mL)     Amphetamine      1000     Barbiturate      200     Benzodiazepine   254     Tricyclics       270     Opiates          300     Cocaine          300     THC              50   Labs are reviewed and are pertinent for abnormal CBC and CMP results.  .  Current Facility-Administered Medications  Medication Dose Route Frequency Provider Last Rate Last Dose  . acetaminophen (TYLENOL) tablet 650 mg  650 mg Oral Q4H PRN Linus Mako, PA-C      . benztropine (COGENTIN) tablet 1 mg  1 mg Oral BID Delfin Gant, NP   1 mg at 01/03/14 1004  . diazepam (VALIUM) tablet 5 mg  5 mg Oral q morning - 10a Houston Siren, MD   5 mg at 01/03/14 1004  . folic acid (FOLVITE) tablet 1 mg  1 mg Oral Daily Delfin Gant, NP   1 mg at 01/03/14 1004  . haloperidol (HALDOL) tablet 10 mg  10 mg Oral BID Delfin Gant, NP   10 mg at 01/03/14 1004  . ibuprofen (ADVIL,MOTRIN) tablet 600 mg  600 mg Oral Q8H PRN Linus Mako, PA-C   600 mg at 01/03/14 1043  . LORazepam (ATIVAN) tablet 1 mg  1 mg Oral Once Tiffany G Greene, PA-C      . nicotine (NICODERM CQ - dosed in mg/24 hours) patch 21 mg  21 mg Transdermal Daily Linus Mako, PA-C   21 mg at 01/03/14 1005  . ondansetron (ZOFRAN) tablet 4 mg  4 mg Oral Q8H PRN Linus Mako, PA-C   4 mg at 01/02/14 1153  . pantoprazole (PROTONIX) EC tablet 40 mg  40 mg Oral  Daily Linus Mako, PA-C   40 mg at 01/03/14 1004  . thiamine (VITAMIN B-1) tablet 100 mg  100 mg Oral Daily Delfin Gant, NP   100 mg at 01/03/14 1005  . Valproic Acid (DEPAKENE) 250 MG/5ML syrup SYRP 250 mg  250 mg Oral BID Delfin Gant, NP   250 mg at 01/03/14 1005  . zolpidem (AMBIEN) tablet 5 mg  5 mg Oral QHS PRN Linus Mako, PA-C   5 mg  at 01/01/14 2159   Current Outpatient Prescriptions  Medication Sig Dispense Refill  . benztropine (COGENTIN) 1 MG tablet Take 1 mg by mouth 2 (two) times daily.      . diazepam (VALIUM) 2 MG tablet Take 2 mg by mouth every morning.      . haloperidol decanoate (HALDOL DECANOATE) 100 MG/ML injection Inject 150 mg into the muscle every 28 (twenty-eight) days. Give one IM injection of $RemoveBefo'100mg'aoetVfovZWo$ .every month.  Last given 03/07/2012, next due 04/07/2012.      . Multiple Vitamin (MULTIVITAMIN WITH MINERALS) TABS tablet Take 1 tablet by mouth every morning.      . pantoprazole (PROTONIX) 40 MG tablet Take 40 mg by mouth daily.      [name] SILENOR 6 MG TABS Take 1 tablet by mouth at bedtime.       . [DISCONTINUED] haloperidol lactate (HALDOL) 5 MG/ML injection Inject 150 mg into the skin every 30 (thirty) days.      . [DISCONTINUED] zolpidem (AMBIEN) 5 MG tablet Take 5 mg by mouth at bedtime as needed.        Psychiatric Specialty Exam:     Blood pressure 97/60, pulse 101, temperature 97.8 F (36.6 C), temperature source Oral, resp. rate 20, SpO2 96.00%.There is no weight on file to calculate BMI.  General Appearance: Casual and Disheveled  Eye Contact::  Absent  Speech:  Slow and not much talking noted  Volume:  Decreased  Mood:  unable to obtain, patient rambles words occassionally  Affect:  Constricted, Depressed, Flat and Restricted  Thought Process:  Disorganized and unable to obtain much information  Orientation:  Other:  unable to obtain at this time  Thought Content:  unable to obtain at this time  Suicidal Thoughts:  No  Homicidal  Thoughts:  No  Memory:  unable to obtain information at this time  Judgement:  Other:  unable to obtain information at this time  Insight:  Shallow and unable to determine insight  Psychomotor Activity:  Normal  Concentration:  Poor  Recall:  unable to obtain  Akathisia:  NA  Handed:  Right  AIMS (if indicated):     Assets:  Others:  Unable to obtain, brought in by brother for Psychiatric care  Sleep:      Treatment Plan Summary:   Case discussed with physician extender and continue current medication managment Recommend admission to inpatient Psychiatric unit for safety and stabilization Will obtain list of medications from his mother who is POA and [facility]   Daily contact with patient to assess and evaluate symptoms and progress in treatment Medication management  Asad Keeven,JANARDHAHA R. 01/03/2014 11:53 AM

## 2014-01-04 DIAGNOSIS — F29 Unspecified psychosis not due to a substance or known physiological condition: Secondary | ICD-10-CM

## 2014-01-04 DIAGNOSIS — F200 Paranoid schizophrenia: Secondary | ICD-10-CM | POA: Diagnosis not present

## 2014-01-04 MED ORDER — LORAZEPAM 1 MG PO TABS
1.0000 mg | ORAL_TABLET | Freq: Once | ORAL | Status: AC
  Administered 2014-01-04: 1 mg via ORAL
  Filled 2014-01-04: qty 1

## 2014-01-04 MED ORDER — HALOPERIDOL 5 MG PO TABS
10.0000 mg | ORAL_TABLET | Freq: Two times a day (BID) | ORAL | Status: DC
  Administered 2014-01-04 – 2014-01-05 (×3): 10 mg via ORAL
  Filled 2014-01-04 (×3): qty 2

## 2014-01-04 NOTE — BHH Counselor (Signed)
Per Roderic Palau at [facility], they have no adult beds at this time.  Arnold Long, [place] Assessment Counselor

## 2014-01-04 NOTE — BH Assessment (Signed)
Gwenette Greet at [facility] [phone]) reports patient declined 01/04/2014 at 9:50 AM  due to no bed availability. Sheilah Pigeon, LCSW

## 2014-01-04 NOTE — Consult Note (Signed)
  Pt is awaiting inpatient admission. Patient is bizarre, saying the "building is leaning," having poor sleep which which may be exacerbating his psychosis.      Psychiatric Specialty Exam: Physical Exam  ROS  Blood pressure 167/93, pulse 96, temperature 97.8 F (36.6 C), temperature source Oral, resp. rate 16, SpO2 99.00%.There is no weight on file to calculate BMI.  General Appearance: Bizarre  Eye Contact::  Poor  Speech:  Garbled  Volume:  Decreased  Mood:  Depressed, Dysphoric and Irritable  Affect:  Constricted  Thought Process:  Circumstantial, Disorganized and Tangential  Orientation:  Full (Time, Place, and Person)  Thought Content:  Ideas of Reference:   Paranoia, Paranoid Ideation and Rumination  Suicidal Thoughts:  No  Homicidal Thoughts:  No  Memory:  Negative Immediate;   Fair  Judgement:  Impaired  Insight:  Lacking  Psychomotor Activity:  Decreased  Concentration:  Fair  Recall:  Fair  Akathisia:  No  Handed:  Right  AIMS (if indicated):   0  Assets:  Communication Skills Desire for Improvement Physical Health Resilience Social Support  SleepErasmo Leventhal, [place] 5:44 PM 01/04/2014

## 2014-01-04 NOTE — ED Notes (Signed)
Pt is resting comfortably, VS has not been obtained.  RN is notified.

## 2014-01-04 NOTE — ED Notes (Signed)
Patient came to desk to ask what was the date. Patient appears mildly confused. Patient reoriented to time, date and place. Safety maintain.

## 2014-01-04 NOTE — BH Assessment (Signed)
01/04/14-Robinette confirmed that patient is on the [facility] waitlist 1005.

## 2014-01-05 DIAGNOSIS — F200 Paranoid schizophrenia: Secondary | ICD-10-CM | POA: Diagnosis not present

## 2014-01-05 DIAGNOSIS — F29 Unspecified psychosis not due to a substance or known physiological condition: Secondary | ICD-10-CM | POA: Diagnosis not present

## 2014-01-05 MED ORDER — DIAZEPAM 5 MG PO TABS
5.0000 mg | ORAL_TABLET | Freq: Three times a day (TID) | ORAL | Status: DC | PRN
  Administered 2014-01-05 (×2): 5 mg via ORAL
  Filled 2014-01-05 (×2): qty 1

## 2014-01-05 MED ORDER — DIPHENHYDRAMINE HCL 25 MG PO CAPS
50.0000 mg | ORAL_CAPSULE | Freq: Once | ORAL | Status: AC
  Administered 2014-01-05: 50 mg via ORAL
  Filled 2014-01-05: qty 2

## 2014-01-05 NOTE — Progress Notes (Signed)
Patient appears disorganized and tangential. The patient has a labile mood and affect. 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 The patient remains cooperative at this time. Will continue to monitor patient q15 minutes for safety.

## 2014-01-05 NOTE — ED Notes (Signed)
Patient left the unit ambulatory in the custody of [facility] for transport to [facility].

## 2014-01-05 NOTE — BHH Counselor (Addendum)
Marlowe Kays from [facility] requested past few days' MARs and most recent vitals for pt. Writer faxed requested info. Marlowe Kays reviewed info and reported to Probation officer that pt has a bed at [facility] and can be transferred as soon as can be arranged.  Pt's RN will arrange transport.   Arnold Long, [place] Assessment Counselor

## 2014-01-05 NOTE — Consult Note (Signed)
  Mr David [name] is scheduled to be transferred to [facility] today.  He remains psychotic and talking to people who are not there.  He makes threatening moves, shouts, talks about things that make no sense such as the electric chair and that it is his private business, all done staring at someone as he yells at them.  I still agree with the plan to transfer to [facility].

## 2014-01-05 NOTE — Consult Note (Signed)
Patient seen, needs inpatient admission

## 2014-01-05 NOTE — Progress Notes (Signed)
Patient has elevated blood pressure and heart rate (BP 168/99 and HR 103). RN rechecked patient's pressure and it remained elevated (BP 160s/90s). Patient denies any dizziness, headaches, etc. RN notified MD Lovena Le. No new orders were given. Will continue to monitor patient for safety.

## 2014-01-05 NOTE — BHH Counselor (Signed)
Dr. Lovena Le signed renewal of IVC for 10 days starting today. His original IVC paperwork was good for only 3 days. Copy placed in pt's chart and in IVC log.   Arnold Long, [place] Assessment Counselor

## 2014-01-06 NOTE — ED Notes (Signed)
Received call from pt's mother stating that pt was at [facility] and had left his wallet here at [facility] ED.  Informed her that per the belongings sheet, all belongings had been given to pt's father prior to pt coming to Psych ED.  Per belongings sheet, pt only locked up boxers, which he had worn onto the unit initially.  Mother states that she will call pt's father and have his call us, because father states that he does not belongings.

## 2014-01-06 NOTE — ED Notes (Signed)
Pt's mother

## 2014-01-10 DIAGNOSIS — S0230XA Fracture of orbital floor, unspecified side, initial encounter for closed fracture: Secondary | ICD-10-CM | POA: Diagnosis not present

## 2014-01-10 DIAGNOSIS — W010XXA Fall on same level from slipping, tripping and stumbling without subsequent striking against object, initial encounter: Secondary | ICD-10-CM | POA: Diagnosis not present

## 2014-01-10 DIAGNOSIS — S0280XA Fracture of other specified skull and facial bones, unspecified side, initial encounter for closed fracture: Secondary | ICD-10-CM | POA: Diagnosis not present

## 2014-01-10 DIAGNOSIS — F209 Schizophrenia, unspecified: Secondary | ICD-10-CM | POA: Diagnosis not present

## 2014-01-10 DIAGNOSIS — S0990XA Unspecified injury of head, initial encounter: Secondary | ICD-10-CM | POA: Diagnosis not present

## 2014-01-11 DIAGNOSIS — S0230XA Fracture of orbital floor, unspecified side, initial encounter for closed fracture: Secondary | ICD-10-CM | POA: Diagnosis not present

## 2014-01-18 DIAGNOSIS — F411 Generalized anxiety disorder: Secondary | ICD-10-CM | POA: Diagnosis present

## 2014-01-18 DIAGNOSIS — J320 Chronic maxillary sinusitis: Secondary | ICD-10-CM | POA: Diagnosis present

## 2014-01-18 DIAGNOSIS — R4182 Altered mental status, unspecified: Secondary | ICD-10-CM | POA: Diagnosis not present

## 2014-01-18 DIAGNOSIS — F209 Schizophrenia, unspecified: Secondary | ICD-10-CM | POA: Diagnosis not present

## 2014-01-18 DIAGNOSIS — K219 Gastro-esophageal reflux disease without esophagitis: Secondary | ICD-10-CM | POA: Diagnosis not present

## 2014-01-18 DIAGNOSIS — G9340 Encephalopathy, unspecified: Secondary | ICD-10-CM | POA: Diagnosis not present

## 2014-01-18 DIAGNOSIS — J449 Chronic obstructive pulmonary disease, unspecified: Secondary | ICD-10-CM | POA: Diagnosis present

## 2014-01-18 DIAGNOSIS — J90 Pleural effusion, not elsewhere classified: Secondary | ICD-10-CM | POA: Diagnosis not present

## 2014-01-18 DIAGNOSIS — F17200 Nicotine dependence, unspecified, uncomplicated: Secondary | ICD-10-CM | POA: Diagnosis present

## 2014-01-18 DIAGNOSIS — F200 Paranoid schizophrenia: Secondary | ICD-10-CM | POA: Diagnosis not present

## 2014-01-18 DIAGNOSIS — R918 Other nonspecific abnormal finding of lung field: Secondary | ICD-10-CM | POA: Diagnosis not present

## 2014-01-18 DIAGNOSIS — I10 Essential (primary) hypertension: Secondary | ICD-10-CM | POA: Diagnosis not present

## 2014-01-18 DIAGNOSIS — J61 Pneumoconiosis due to asbestos and other mineral fibers: Secondary | ICD-10-CM | POA: Diagnosis present

## 2014-02-20 DIAGNOSIS — F200 Paranoid schizophrenia: Secondary | ICD-10-CM | POA: Diagnosis not present

## 2014-02-20 DIAGNOSIS — K219 Gastro-esophageal reflux disease without esophagitis: Secondary | ICD-10-CM | POA: Diagnosis not present

## 2014-02-20 DIAGNOSIS — J449 Chronic obstructive pulmonary disease, unspecified: Secondary | ICD-10-CM | POA: Diagnosis present

## 2014-02-20 DIAGNOSIS — F17200 Nicotine dependence, unspecified, uncomplicated: Secondary | ICD-10-CM | POA: Diagnosis present

## 2014-02-20 DIAGNOSIS — R195 Other fecal abnormalities: Secondary | ICD-10-CM | POA: Diagnosis not present

## 2014-02-20 DIAGNOSIS — I10 Essential (primary) hypertension: Secondary | ICD-10-CM | POA: Diagnosis not present

## 2014-02-20 DIAGNOSIS — R6889 Other general symptoms and signs: Secondary | ICD-10-CM | POA: Diagnosis not present

## 2014-02-20 DIAGNOSIS — R000 Tachycardia, unspecified: Secondary | ICD-10-CM | POA: Diagnosis present

## 2014-02-20 DIAGNOSIS — N400 Enlarged prostate without lower urinary tract symptoms: Secondary | ICD-10-CM | POA: Diagnosis present

## 2014-02-20 DIAGNOSIS — IMO0002 Reserved for concepts with insufficient information to code with codable children: Secondary | ICD-10-CM | POA: Diagnosis not present

## 2014-02-20 DIAGNOSIS — K920 Hematemesis: Secondary | ICD-10-CM | POA: Diagnosis not present

## 2014-02-20 DIAGNOSIS — R1110 Vomiting, unspecified: Secondary | ICD-10-CM | POA: Diagnosis not present

## 2014-02-20 DIAGNOSIS — K921 Melena: Secondary | ICD-10-CM | POA: Diagnosis present

## 2014-02-20 DIAGNOSIS — K922 Gastrointestinal hemorrhage, unspecified: Secondary | ICD-10-CM | POA: Diagnosis not present

## 2014-02-20 DIAGNOSIS — D696 Thrombocytopenia, unspecified: Secondary | ICD-10-CM | POA: Diagnosis present

## 2014-02-20 DIAGNOSIS — T189XXA Foreign body of alimentary tract, part unspecified, initial encounter: Secondary | ICD-10-CM | POA: Diagnosis not present

## 2014-02-20 DIAGNOSIS — I498 Other specified cardiac arrhythmias: Secondary | ICD-10-CM | POA: Diagnosis not present

## 2014-02-20 DIAGNOSIS — E871 Hypo-osmolality and hyponatremia: Secondary | ICD-10-CM | POA: Diagnosis present

## 2014-02-20 DIAGNOSIS — D649 Anemia, unspecified: Secondary | ICD-10-CM | POA: Diagnosis not present

## 2014-02-20 DIAGNOSIS — F209 Schizophrenia, unspecified: Secondary | ICD-10-CM | POA: Diagnosis not present

## 2014-02-22 DIAGNOSIS — K2100 Gastro-esophageal reflux disease with esophagitis, without bleeding: Secondary | ICD-10-CM | POA: Diagnosis not present

## 2014-02-22 DIAGNOSIS — K209 Esophagitis, unspecified without bleeding: Secondary | ICD-10-CM | POA: Diagnosis not present

## 2014-02-22 DIAGNOSIS — F209 Schizophrenia, unspecified: Secondary | ICD-10-CM | POA: Diagnosis not present

## 2014-02-22 DIAGNOSIS — F29 Unspecified psychosis not due to a substance or known physiological condition: Secondary | ICD-10-CM | POA: Diagnosis not present

## 2014-02-22 DIAGNOSIS — D62 Acute posthemorrhagic anemia: Secondary | ICD-10-CM | POA: Diagnosis not present

## 2014-02-22 DIAGNOSIS — K922 Gastrointestinal hemorrhage, unspecified: Secondary | ICD-10-CM | POA: Diagnosis not present

## 2014-02-22 DIAGNOSIS — K449 Diaphragmatic hernia without obstruction or gangrene: Secondary | ICD-10-CM | POA: Diagnosis not present

## 2014-02-23 DIAGNOSIS — G40909 Epilepsy, unspecified, not intractable, without status epilepticus: Secondary | ICD-10-CM | POA: Diagnosis not present

## 2014-02-23 DIAGNOSIS — R569 Unspecified convulsions: Secondary | ICD-10-CM | POA: Diagnosis not present

## 2014-02-23 DIAGNOSIS — R0609 Other forms of dyspnea: Secondary | ICD-10-CM | POA: Diagnosis not present

## 2014-02-23 DIAGNOSIS — F209 Schizophrenia, unspecified: Secondary | ICD-10-CM | POA: Diagnosis not present

## 2014-02-23 DIAGNOSIS — K922 Gastrointestinal hemorrhage, unspecified: Secondary | ICD-10-CM | POA: Diagnosis not present

## 2014-02-23 DIAGNOSIS — I6789 Other cerebrovascular disease: Secondary | ICD-10-CM | POA: Diagnosis not present

## 2014-02-23 DIAGNOSIS — I10 Essential (primary) hypertension: Secondary | ICD-10-CM | POA: Diagnosis not present

## 2014-02-23 DIAGNOSIS — R0989 Other specified symptoms and signs involving the circulatory and respiratory systems: Secondary | ICD-10-CM | POA: Diagnosis not present

## 2014-02-24 DIAGNOSIS — I10 Essential (primary) hypertension: Secondary | ICD-10-CM | POA: Diagnosis not present

## 2014-02-24 DIAGNOSIS — R4182 Altered mental status, unspecified: Secondary | ICD-10-CM | POA: Diagnosis not present

## 2014-02-24 DIAGNOSIS — F068 Other specified mental disorders due to known physiological condition: Secondary | ICD-10-CM | POA: Diagnosis not present

## 2014-02-24 DIAGNOSIS — F17200 Nicotine dependence, unspecified, uncomplicated: Secondary | ICD-10-CM | POA: Diagnosis not present

## 2014-02-24 DIAGNOSIS — K922 Gastrointestinal hemorrhage, unspecified: Secondary | ICD-10-CM | POA: Diagnosis not present

## 2014-02-24 DIAGNOSIS — F209 Schizophrenia, unspecified: Secondary | ICD-10-CM | POA: Diagnosis not present

## 2014-02-24 DIAGNOSIS — I4581 Long QT syndrome: Secondary | ICD-10-CM | POA: Diagnosis not present

## 2014-02-24 DIAGNOSIS — R569 Unspecified convulsions: Secondary | ICD-10-CM | POA: Diagnosis not present

## 2014-02-25 DIAGNOSIS — I10 Essential (primary) hypertension: Secondary | ICD-10-CM | POA: Diagnosis not present

## 2014-02-25 DIAGNOSIS — R4182 Altered mental status, unspecified: Secondary | ICD-10-CM | POA: Diagnosis not present

## 2014-02-25 DIAGNOSIS — R569 Unspecified convulsions: Secondary | ICD-10-CM | POA: Diagnosis not present

## 2014-02-25 DIAGNOSIS — F17200 Nicotine dependence, unspecified, uncomplicated: Secondary | ICD-10-CM | POA: Diagnosis not present

## 2014-02-25 DIAGNOSIS — K922 Gastrointestinal hemorrhage, unspecified: Secondary | ICD-10-CM | POA: Diagnosis not present

## 2014-02-25 DIAGNOSIS — F209 Schizophrenia, unspecified: Secondary | ICD-10-CM | POA: Diagnosis not present

## 2014-02-25 DIAGNOSIS — F068 Other specified mental disorders due to known physiological condition: Secondary | ICD-10-CM | POA: Diagnosis not present

## 2014-02-26 DIAGNOSIS — R569 Unspecified convulsions: Secondary | ICD-10-CM | POA: Diagnosis not present

## 2014-02-26 DIAGNOSIS — F209 Schizophrenia, unspecified: Secondary | ICD-10-CM | POA: Diagnosis not present

## 2014-02-26 DIAGNOSIS — R4182 Altered mental status, unspecified: Secondary | ICD-10-CM | POA: Diagnosis not present

## 2014-02-26 DIAGNOSIS — I10 Essential (primary) hypertension: Secondary | ICD-10-CM | POA: Diagnosis not present

## 2014-02-26 DIAGNOSIS — F068 Other specified mental disorders due to known physiological condition: Secondary | ICD-10-CM | POA: Diagnosis not present

## 2014-02-26 DIAGNOSIS — K922 Gastrointestinal hemorrhage, unspecified: Secondary | ICD-10-CM | POA: Diagnosis not present

## 2014-02-27 DIAGNOSIS — I10 Essential (primary) hypertension: Secondary | ICD-10-CM | POA: Diagnosis not present

## 2014-02-27 DIAGNOSIS — F068 Other specified mental disorders due to known physiological condition: Secondary | ICD-10-CM | POA: Diagnosis not present

## 2014-02-27 DIAGNOSIS — R4182 Altered mental status, unspecified: Secondary | ICD-10-CM | POA: Diagnosis not present

## 2014-02-27 DIAGNOSIS — F209 Schizophrenia, unspecified: Secondary | ICD-10-CM | POA: Diagnosis not present

## 2014-02-27 DIAGNOSIS — K922 Gastrointestinal hemorrhage, unspecified: Secondary | ICD-10-CM | POA: Diagnosis not present

## 2014-02-28 DIAGNOSIS — F209 Schizophrenia, unspecified: Secondary | ICD-10-CM | POA: Diagnosis not present

## 2014-02-28 DIAGNOSIS — I10 Essential (primary) hypertension: Secondary | ICD-10-CM | POA: Diagnosis not present

## 2014-02-28 DIAGNOSIS — G40909 Epilepsy, unspecified, not intractable, without status epilepticus: Secondary | ICD-10-CM | POA: Diagnosis not present

## 2014-02-28 DIAGNOSIS — F068 Other specified mental disorders due to known physiological condition: Secondary | ICD-10-CM | POA: Diagnosis not present

## 2014-03-30 DIAGNOSIS — D649 Anemia, unspecified: Secondary | ICD-10-CM | POA: Diagnosis not present

## 2014-03-30 DIAGNOSIS — D696 Thrombocytopenia, unspecified: Secondary | ICD-10-CM | POA: Diagnosis not present

## 2014-03-30 DIAGNOSIS — K920 Hematemesis: Secondary | ICD-10-CM | POA: Diagnosis not present

## 2014-03-30 DIAGNOSIS — D62 Acute posthemorrhagic anemia: Secondary | ICD-10-CM | POA: Diagnosis not present

## 2014-03-30 DIAGNOSIS — J449 Chronic obstructive pulmonary disease, unspecified: Secondary | ICD-10-CM | POA: Diagnosis present

## 2014-03-30 DIAGNOSIS — K219 Gastro-esophageal reflux disease without esophagitis: Secondary | ICD-10-CM | POA: Diagnosis present

## 2014-03-30 DIAGNOSIS — Z87891 Personal history of nicotine dependence: Secondary | ICD-10-CM | POA: Diagnosis not present

## 2014-03-30 DIAGNOSIS — K209 Esophagitis, unspecified without bleeding: Secondary | ICD-10-CM | POA: Diagnosis not present

## 2014-03-30 DIAGNOSIS — K449 Diaphragmatic hernia without obstruction or gangrene: Secondary | ICD-10-CM | POA: Diagnosis present

## 2014-03-30 DIAGNOSIS — N183 Chronic kidney disease, stage 3 unspecified: Secondary | ICD-10-CM | POA: Diagnosis present

## 2014-03-30 DIAGNOSIS — F200 Paranoid schizophrenia: Secondary | ICD-10-CM | POA: Diagnosis present

## 2014-03-30 DIAGNOSIS — I129 Hypertensive chronic kidney disease with stage 1 through stage 4 chronic kidney disease, or unspecified chronic kidney disease: Secondary | ICD-10-CM | POA: Diagnosis present

## 2014-04-02 DIAGNOSIS — F200 Paranoid schizophrenia: Secondary | ICD-10-CM | POA: Diagnosis not present

## 2014-04-02 DIAGNOSIS — K319 Disease of stomach and duodenum, unspecified: Secondary | ICD-10-CM | POA: Diagnosis not present

## 2014-04-02 DIAGNOSIS — N183 Chronic kidney disease, stage 3 unspecified: Secondary | ICD-10-CM | POA: Diagnosis not present

## 2014-04-02 DIAGNOSIS — K2960 Other gastritis without bleeding: Secondary | ICD-10-CM | POA: Diagnosis not present

## 2014-04-02 DIAGNOSIS — F209 Schizophrenia, unspecified: Secondary | ICD-10-CM | POA: Diagnosis not present

## 2014-04-02 DIAGNOSIS — D649 Anemia, unspecified: Secondary | ICD-10-CM | POA: Diagnosis not present

## 2014-04-02 DIAGNOSIS — K922 Gastrointestinal hemorrhage, unspecified: Secondary | ICD-10-CM | POA: Diagnosis not present

## 2014-04-02 DIAGNOSIS — M7020 Olecranon bursitis, unspecified elbow: Secondary | ICD-10-CM | POA: Diagnosis not present

## 2014-04-02 DIAGNOSIS — K2100 Gastro-esophageal reflux disease with esophagitis, without bleeding: Secondary | ICD-10-CM | POA: Diagnosis not present

## 2014-04-02 DIAGNOSIS — Z87891 Personal history of nicotine dependence: Secondary | ICD-10-CM | POA: Diagnosis not present

## 2014-04-02 DIAGNOSIS — K229 Disease of esophagus, unspecified: Secondary | ICD-10-CM | POA: Diagnosis not present

## 2014-04-02 DIAGNOSIS — D721 Eosinophilia, unspecified: Secondary | ICD-10-CM | POA: Diagnosis not present

## 2014-04-02 DIAGNOSIS — K209 Esophagitis, unspecified without bleeding: Secondary | ICD-10-CM | POA: Diagnosis not present

## 2014-04-02 DIAGNOSIS — K449 Diaphragmatic hernia without obstruction or gangrene: Secondary | ICD-10-CM | POA: Diagnosis not present

## 2014-04-02 DIAGNOSIS — J449 Chronic obstructive pulmonary disease, unspecified: Secondary | ICD-10-CM | POA: Diagnosis not present

## 2014-04-02 DIAGNOSIS — H1130 Conjunctival hemorrhage, unspecified eye: Secondary | ICD-10-CM | POA: Diagnosis not present

## 2014-04-02 DIAGNOSIS — I129 Hypertensive chronic kidney disease with stage 1 through stage 4 chronic kidney disease, or unspecified chronic kidney disease: Secondary | ICD-10-CM | POA: Diagnosis not present

## 2014-04-02 DIAGNOSIS — D696 Thrombocytopenia, unspecified: Secondary | ICD-10-CM | POA: Diagnosis not present

## 2014-04-02 DIAGNOSIS — D693 Immune thrombocytopenic purpura: Secondary | ICD-10-CM | POA: Diagnosis not present

## 2014-04-02 DIAGNOSIS — D500 Iron deficiency anemia secondary to blood loss (chronic): Secondary | ICD-10-CM | POA: Diagnosis not present

## 2014-04-23 DIAGNOSIS — D696 Thrombocytopenia, unspecified: Secondary | ICD-10-CM | POA: Diagnosis not present

## 2014-04-23 DIAGNOSIS — D649 Anemia, unspecified: Secondary | ICD-10-CM | POA: Diagnosis not present

## 2014-04-26 DIAGNOSIS — K229 Disease of esophagus, unspecified: Secondary | ICD-10-CM | POA: Diagnosis not present

## 2014-04-26 DIAGNOSIS — K209 Esophagitis, unspecified without bleeding: Secondary | ICD-10-CM | POA: Diagnosis not present

## 2014-04-26 DIAGNOSIS — K319 Disease of stomach and duodenum, unspecified: Secondary | ICD-10-CM | POA: Diagnosis not present

## 2014-04-26 DIAGNOSIS — K449 Diaphragmatic hernia without obstruction or gangrene: Secondary | ICD-10-CM | POA: Diagnosis not present

## 2014-04-26 DIAGNOSIS — K2100 Gastro-esophageal reflux disease with esophagitis, without bleeding: Secondary | ICD-10-CM | POA: Diagnosis not present

## 2014-04-26 DIAGNOSIS — K922 Gastrointestinal hemorrhage, unspecified: Secondary | ICD-10-CM | POA: Diagnosis not present

## 2014-05-11 DIAGNOSIS — D693 Immune thrombocytopenic purpura: Secondary | ICD-10-CM | POA: Diagnosis not present

## 2014-06-21 DIAGNOSIS — D696 Thrombocytopenia, unspecified: Secondary | ICD-10-CM | POA: Diagnosis not present

## 2014-06-21 DIAGNOSIS — D500 Iron deficiency anemia secondary to blood loss (chronic): Secondary | ICD-10-CM | POA: Diagnosis not present

## 2014-06-22 DIAGNOSIS — D500 Iron deficiency anemia secondary to blood loss (chronic): Secondary | ICD-10-CM | POA: Diagnosis not present

## 2014-06-22 DIAGNOSIS — D696 Thrombocytopenia, unspecified: Secondary | ICD-10-CM | POA: Diagnosis not present

## 2014-07-22 DIAGNOSIS — D693 Immune thrombocytopenic purpura: Secondary | ICD-10-CM | POA: Diagnosis not present

## 2014-07-28 DIAGNOSIS — L0291 Cutaneous abscess, unspecified: Secondary | ICD-10-CM | POA: Diagnosis not present

## 2014-07-29 DIAGNOSIS — R509 Fever, unspecified: Secondary | ICD-10-CM | POA: Diagnosis not present

## 2014-07-29 DIAGNOSIS — L0291 Cutaneous abscess, unspecified: Secondary | ICD-10-CM | POA: Diagnosis not present

## 2014-07-29 DIAGNOSIS — R42 Dizziness and giddiness: Secondary | ICD-10-CM | POA: Diagnosis not present

## 2014-07-29 DIAGNOSIS — L0390 Cellulitis, unspecified: Secondary | ICD-10-CM | POA: Diagnosis not present

## 2014-07-30 DIAGNOSIS — R42 Dizziness and giddiness: Secondary | ICD-10-CM | POA: Diagnosis not present

## 2014-07-30 DIAGNOSIS — L0291 Cutaneous abscess, unspecified: Secondary | ICD-10-CM | POA: Diagnosis not present

## 2014-07-30 DIAGNOSIS — L0390 Cellulitis, unspecified: Secondary | ICD-10-CM | POA: Diagnosis not present

## 2014-07-30 DIAGNOSIS — R509 Fever, unspecified: Secondary | ICD-10-CM | POA: Diagnosis not present

## 2014-08-04 DIAGNOSIS — D693 Immune thrombocytopenic purpura: Secondary | ICD-10-CM | POA: Diagnosis not present

## 2014-08-11 DIAGNOSIS — D693 Immune thrombocytopenic purpura: Secondary | ICD-10-CM | POA: Diagnosis not present

## 2014-08-18 DIAGNOSIS — D693 Immune thrombocytopenic purpura: Secondary | ICD-10-CM | POA: Diagnosis not present

## 2014-08-24 DIAGNOSIS — D693 Immune thrombocytopenic purpura: Secondary | ICD-10-CM | POA: Diagnosis not present

## 2014-08-31 DIAGNOSIS — N183 Chronic kidney disease, stage 3 unspecified: Secondary | ICD-10-CM | POA: Diagnosis not present

## 2014-08-31 DIAGNOSIS — J449 Chronic obstructive pulmonary disease, unspecified: Secondary | ICD-10-CM | POA: Diagnosis not present

## 2014-08-31 DIAGNOSIS — Z79899 Other long term (current) drug therapy: Secondary | ICD-10-CM | POA: Diagnosis not present

## 2014-08-31 DIAGNOSIS — I129 Hypertensive chronic kidney disease with stage 1 through stage 4 chronic kidney disease, or unspecified chronic kidney disease: Secondary | ICD-10-CM | POA: Diagnosis not present

## 2014-08-31 DIAGNOSIS — K219 Gastro-esophageal reflux disease without esophagitis: Secondary | ICD-10-CM | POA: Diagnosis not present

## 2014-08-31 DIAGNOSIS — D693 Immune thrombocytopenic purpura: Secondary | ICD-10-CM | POA: Diagnosis not present

## 2014-08-31 DIAGNOSIS — Z5181 Encounter for therapeutic drug level monitoring: Secondary | ICD-10-CM | POA: Diagnosis not present

## 2014-08-31 DIAGNOSIS — F200 Paranoid schizophrenia: Secondary | ICD-10-CM | POA: Diagnosis not present

## 2014-09-07 DIAGNOSIS — D696 Thrombocytopenia, unspecified: Secondary | ICD-10-CM | POA: Diagnosis not present

## 2014-09-07 DIAGNOSIS — D693 Immune thrombocytopenic purpura: Secondary | ICD-10-CM | POA: Diagnosis not present

## 2014-09-16 DIAGNOSIS — D693 Immune thrombocytopenic purpura: Secondary | ICD-10-CM | POA: Diagnosis not present

## 2014-09-16 DIAGNOSIS — Z79899 Other long term (current) drug therapy: Secondary | ICD-10-CM | POA: Diagnosis not present

## 2014-09-21 DIAGNOSIS — D693 Immune thrombocytopenic purpura: Secondary | ICD-10-CM | POA: Diagnosis not present

## 2014-09-21 DIAGNOSIS — D696 Thrombocytopenia, unspecified: Secondary | ICD-10-CM | POA: Diagnosis not present

## 2014-09-28 DIAGNOSIS — D693 Immune thrombocytopenic purpura: Secondary | ICD-10-CM | POA: Diagnosis not present

## 2014-10-05 DIAGNOSIS — D693 Immune thrombocytopenic purpura: Secondary | ICD-10-CM | POA: Diagnosis not present

## 2014-10-12 DIAGNOSIS — D693 Immune thrombocytopenic purpura: Secondary | ICD-10-CM | POA: Diagnosis not present

## 2014-10-12 DIAGNOSIS — D696 Thrombocytopenia, unspecified: Secondary | ICD-10-CM | POA: Diagnosis not present

## 2014-10-14 DIAGNOSIS — F200 Paranoid schizophrenia: Secondary | ICD-10-CM | POA: Diagnosis not present

## 2014-10-14 DIAGNOSIS — K2961 Other gastritis with bleeding: Secondary | ICD-10-CM | POA: Diagnosis not present

## 2014-10-14 DIAGNOSIS — J9601 Acute respiratory failure with hypoxia: Secondary | ICD-10-CM | POA: Diagnosis not present

## 2014-10-14 DIAGNOSIS — J438 Other emphysema: Secondary | ICD-10-CM | POA: Diagnosis not present

## 2014-10-14 DIAGNOSIS — I10 Essential (primary) hypertension: Secondary | ICD-10-CM | POA: Diagnosis not present

## 2014-10-14 DIAGNOSIS — K922 Gastrointestinal hemorrhage, unspecified: Secondary | ICD-10-CM | POA: Diagnosis not present

## 2014-10-14 DIAGNOSIS — R6511 Systemic inflammatory response syndrome (SIRS) of non-infectious origin with acute organ dysfunction: Secondary | ICD-10-CM | POA: Diagnosis not present

## 2014-10-14 DIAGNOSIS — D693 Immune thrombocytopenic purpura: Secondary | ICD-10-CM | POA: Diagnosis not present

## 2014-10-14 DIAGNOSIS — F17200 Nicotine dependence, unspecified, uncomplicated: Secondary | ICD-10-CM | POA: Diagnosis present

## 2014-10-14 DIAGNOSIS — D72828 Other elevated white blood cell count: Secondary | ICD-10-CM | POA: Diagnosis not present

## 2014-10-14 DIAGNOSIS — J441 Chronic obstructive pulmonary disease with (acute) exacerbation: Secondary | ICD-10-CM | POA: Diagnosis present

## 2014-10-14 DIAGNOSIS — N183 Chronic kidney disease, stage 3 (moderate): Secondary | ICD-10-CM | POA: Diagnosis not present

## 2014-10-14 DIAGNOSIS — Z79899 Other long term (current) drug therapy: Secondary | ICD-10-CM | POA: Diagnosis not present

## 2014-10-14 DIAGNOSIS — I129 Hypertensive chronic kidney disease with stage 1 through stage 4 chronic kidney disease, or unspecified chronic kidney disease: Secondary | ICD-10-CM | POA: Diagnosis present

## 2014-10-14 DIAGNOSIS — D72820 Lymphocytosis (symptomatic): Secondary | ICD-10-CM | POA: Diagnosis not present

## 2014-10-14 DIAGNOSIS — D62 Acute posthemorrhagic anemia: Secondary | ICD-10-CM | POA: Diagnosis not present

## 2014-10-14 DIAGNOSIS — R6889 Other general symptoms and signs: Secondary | ICD-10-CM | POA: Diagnosis not present

## 2014-10-14 DIAGNOSIS — R05 Cough: Secondary | ICD-10-CM | POA: Diagnosis not present

## 2014-10-14 DIAGNOSIS — K2270 Barrett's esophagus without dysplasia: Secondary | ICD-10-CM | POA: Diagnosis present

## 2014-10-14 DIAGNOSIS — K219 Gastro-esophageal reflux disease without esophagitis: Secondary | ICD-10-CM | POA: Diagnosis present

## 2014-10-14 DIAGNOSIS — N179 Acute kidney failure, unspecified: Secondary | ICD-10-CM | POA: Diagnosis not present

## 2014-10-14 DIAGNOSIS — F202 Catatonic schizophrenia: Secondary | ICD-10-CM | POA: Diagnosis present

## 2014-10-16 DIAGNOSIS — Z5181 Encounter for therapeutic drug level monitoring: Secondary | ICD-10-CM | POA: Diagnosis not present

## 2014-10-16 DIAGNOSIS — D693 Immune thrombocytopenic purpura: Secondary | ICD-10-CM | POA: Diagnosis not present

## 2014-10-16 DIAGNOSIS — K922 Gastrointestinal hemorrhage, unspecified: Secondary | ICD-10-CM | POA: Diagnosis not present

## 2014-10-16 DIAGNOSIS — R109 Unspecified abdominal pain: Secondary | ICD-10-CM | POA: Diagnosis not present

## 2014-10-16 DIAGNOSIS — J438 Other emphysema: Secondary | ICD-10-CM | POA: Diagnosis not present

## 2014-10-16 DIAGNOSIS — Z23 Encounter for immunization: Secondary | ICD-10-CM | POA: Diagnosis not present

## 2014-10-16 DIAGNOSIS — D62 Acute posthemorrhagic anemia: Secondary | ICD-10-CM | POA: Diagnosis not present

## 2014-10-16 DIAGNOSIS — D696 Thrombocytopenia, unspecified: Secondary | ICD-10-CM | POA: Diagnosis not present

## 2014-10-16 DIAGNOSIS — F209 Schizophrenia, unspecified: Secondary | ICD-10-CM | POA: Diagnosis not present

## 2014-10-16 DIAGNOSIS — Z79899 Other long term (current) drug therapy: Secondary | ICD-10-CM | POA: Diagnosis not present

## 2014-11-17 DIAGNOSIS — Z79899 Other long term (current) drug therapy: Secondary | ICD-10-CM | POA: Diagnosis not present

## 2014-11-17 DIAGNOSIS — D693 Immune thrombocytopenic purpura: Secondary | ICD-10-CM | POA: Diagnosis not present

## 2014-11-18 DIAGNOSIS — K922 Gastrointestinal hemorrhage, unspecified: Secondary | ICD-10-CM | POA: Diagnosis not present

## 2014-11-18 DIAGNOSIS — R109 Unspecified abdominal pain: Secondary | ICD-10-CM | POA: Diagnosis not present

## 2014-11-18 DIAGNOSIS — D696 Thrombocytopenia, unspecified: Secondary | ICD-10-CM | POA: Diagnosis not present

## 2015-01-05 DIAGNOSIS — D693 Immune thrombocytopenic purpura: Secondary | ICD-10-CM | POA: Diagnosis not present

## 2015-01-19 DIAGNOSIS — D693 Immune thrombocytopenic purpura: Secondary | ICD-10-CM | POA: Diagnosis not present

## 2015-02-02 DIAGNOSIS — D693 Immune thrombocytopenic purpura: Secondary | ICD-10-CM | POA: Diagnosis not present

## 2015-02-02 DIAGNOSIS — Z79899 Other long term (current) drug therapy: Secondary | ICD-10-CM | POA: Diagnosis not present

## 2015-02-08 ENCOUNTER — Telehealth: Payer: Self-pay | Admitting: Family Medicine

## 2015-02-08 NOTE — Telephone Encounter (Signed)
Pt's mother notified no pneumonia shot on file at this office

## 2015-02-09 DIAGNOSIS — D693 Immune thrombocytopenic purpura: Secondary | ICD-10-CM | POA: Diagnosis not present

## 2015-02-16 DIAGNOSIS — D693 Immune thrombocytopenic purpura: Secondary | ICD-10-CM | POA: Diagnosis not present

## 2015-02-22 DIAGNOSIS — D696 Thrombocytopenia, unspecified: Secondary | ICD-10-CM | POA: Diagnosis not present

## 2015-02-22 DIAGNOSIS — K828 Other specified diseases of gallbladder: Secondary | ICD-10-CM | POA: Diagnosis not present

## 2015-02-24 DIAGNOSIS — D693 Immune thrombocytopenic purpura: Secondary | ICD-10-CM | POA: Diagnosis not present

## 2015-03-03 DIAGNOSIS — K621 Rectal polyp: Secondary | ICD-10-CM | POA: Diagnosis not present

## 2015-03-03 DIAGNOSIS — K5730 Diverticulosis of large intestine without perforation or abscess without bleeding: Secondary | ICD-10-CM | POA: Diagnosis not present

## 2015-03-03 DIAGNOSIS — K2270 Barrett's esophagus without dysplasia: Secondary | ICD-10-CM | POA: Diagnosis not present

## 2015-03-03 DIAGNOSIS — D696 Thrombocytopenia, unspecified: Secondary | ICD-10-CM | POA: Diagnosis not present

## 2015-03-03 DIAGNOSIS — F209 Schizophrenia, unspecified: Secondary | ICD-10-CM | POA: Diagnosis not present

## 2015-03-03 DIAGNOSIS — Z79899 Other long term (current) drug therapy: Secondary | ICD-10-CM | POA: Diagnosis not present

## 2015-03-03 DIAGNOSIS — N183 Chronic kidney disease, stage 3 (moderate): Secondary | ICD-10-CM | POA: Diagnosis not present

## 2015-03-03 DIAGNOSIS — K228 Other specified diseases of esophagus: Secondary | ICD-10-CM | POA: Diagnosis not present

## 2015-03-03 DIAGNOSIS — D128 Benign neoplasm of rectum: Secondary | ICD-10-CM | POA: Diagnosis not present

## 2015-03-03 DIAGNOSIS — M1380 Other specified arthritis, unspecified site: Secondary | ICD-10-CM | POA: Diagnosis not present

## 2015-03-03 DIAGNOSIS — Z09 Encounter for follow-up examination after completed treatment for conditions other than malignant neoplasm: Secondary | ICD-10-CM | POA: Diagnosis not present

## 2015-03-03 DIAGNOSIS — J449 Chronic obstructive pulmonary disease, unspecified: Secondary | ICD-10-CM | POA: Diagnosis not present

## 2015-03-03 DIAGNOSIS — K2930 Chronic superficial gastritis without bleeding: Secondary | ICD-10-CM | POA: Diagnosis not present

## 2015-03-03 DIAGNOSIS — I129 Hypertensive chronic kidney disease with stage 1 through stage 4 chronic kidney disease, or unspecified chronic kidney disease: Secondary | ICD-10-CM | POA: Diagnosis not present

## 2015-03-03 DIAGNOSIS — K2940 Chronic atrophic gastritis without bleeding: Secondary | ICD-10-CM | POA: Diagnosis not present

## 2015-03-03 DIAGNOSIS — D509 Iron deficiency anemia, unspecified: Secondary | ICD-10-CM | POA: Diagnosis not present

## 2015-03-03 DIAGNOSIS — F17200 Nicotine dependence, unspecified, uncomplicated: Secondary | ICD-10-CM | POA: Diagnosis not present

## 2015-03-03 DIAGNOSIS — K219 Gastro-esophageal reflux disease without esophagitis: Secondary | ICD-10-CM | POA: Diagnosis not present

## 2015-03-03 DIAGNOSIS — K449 Diaphragmatic hernia without obstruction or gangrene: Secondary | ICD-10-CM | POA: Diagnosis not present

## 2015-03-09 DIAGNOSIS — D693 Immune thrombocytopenic purpura: Secondary | ICD-10-CM | POA: Diagnosis not present

## 2015-03-23 DIAGNOSIS — D693 Immune thrombocytopenic purpura: Secondary | ICD-10-CM | POA: Diagnosis not present

## 2015-03-28 ENCOUNTER — Encounter: Payer: Self-pay | Admitting: Hematology & Oncology

## 2015-04-14 ENCOUNTER — Telehealth: Payer: Self-pay | Admitting: Hematology & Oncology

## 2015-04-14 NOTE — Telephone Encounter (Signed)
I spoke w NEW PATIENT today to remind them of their appointment with Dr. Ennever. Also, advised them to bring all medication bottles and insurance card information. ° °

## 2015-04-15 ENCOUNTER — Ambulatory Visit: Payer: Medicare Other

## 2015-04-15 ENCOUNTER — Ambulatory Visit (HOSPITAL_BASED_OUTPATIENT_CLINIC_OR_DEPARTMENT_OTHER): Payer: Medicare Other | Admitting: Hematology & Oncology

## 2015-04-15 ENCOUNTER — Encounter: Payer: Self-pay | Admitting: Hematology & Oncology

## 2015-04-15 ENCOUNTER — Ambulatory Visit: Payer: Self-pay

## 2015-04-15 ENCOUNTER — Other Ambulatory Visit (HOSPITAL_BASED_OUTPATIENT_CLINIC_OR_DEPARTMENT_OTHER): Payer: Medicare Other

## 2015-04-15 VITALS — BP 149/75 | HR 89 | Temp 98.0°F | Resp 20 | Ht 70.0 in | Wt 206.0 lb

## 2015-04-15 DIAGNOSIS — D693 Immune thrombocytopenic purpura: Secondary | ICD-10-CM

## 2015-04-15 LAB — CBC WITH DIFFERENTIAL (CANCER CENTER ONLY)
BASO#: 0.1 10*3/uL (ref 0.0–0.2)
BASO%: 0.7 % (ref 0.0–2.0)
EOS%: 6.6 % (ref 0.0–7.0)
Eosinophils Absolute: 1.1 10*3/uL — ABNORMAL HIGH (ref 0.0–0.5)
HCT: 35 % — ABNORMAL LOW (ref 38.7–49.9)
HGB: 11.3 g/dL — ABNORMAL LOW (ref 13.0–17.1)
LYMPH#: 1.5 10*3/uL (ref 0.9–3.3)
LYMPH%: 9.2 % — AB (ref 14.0–48.0)
MCH: 26.2 pg — ABNORMAL LOW (ref 28.0–33.4)
MCHC: 32.3 g/dL (ref 32.0–35.9)
MCV: 81 fL — AB (ref 82–98)
MONO#: 1 10*3/uL — ABNORMAL HIGH (ref 0.1–0.9)
MONO%: 6 % (ref 0.0–13.0)
NEUT#: 12.8 10*3/uL — ABNORMAL HIGH (ref 1.5–6.5)
NEUT%: 77.5 % (ref 40.0–80.0)
Platelets: 379 10*3/uL (ref 145–400)
RBC: 4.31 10*6/uL (ref 4.20–5.70)
RDW: 19.9 % — AB (ref 11.1–15.7)
WBC: 16.6 10*3/uL — ABNORMAL HIGH (ref 4.0–10.0)

## 2015-04-15 LAB — CHCC SATELLITE - SMEAR

## 2015-04-15 LAB — TECHNOLOGIST REVIEW CHCC SATELLITE

## 2015-04-15 NOTE — Patient Instructions (Signed)
Smoking Cessation Quitting smoking is important to your health and has many advantages. However, it is not always easy to quit since nicotine is a very addictive drug. Oftentimes, people try 3 times or more before being able to quit. This document explains the best ways for you to prepare to quit smoking. Quitting takes hard work and a lot of effort, but you can do it. ADVANTAGES OF QUITTING SMOKING  You will live longer, feel better, and live better.  Your body will feel the impact of quitting smoking almost immediately.  Within 20 minutes, blood pressure decreases. Your pulse returns to its normal level.  After 8 hours, carbon monoxide levels in the blood return to normal. Your oxygen level increases.  After 24 hours, the chance of having a heart attack starts to decrease. Your breath, hair, and body stop smelling like smoke.  After 48 hours, damaged nerve endings begin to recover. Your sense of taste and smell improve.  After 72 hours, the body is virtually free of nicotine. Your bronchial tubes relax and breathing becomes easier.  After 2 to 12 weeks, lungs can hold more air. Exercise becomes easier and circulation improves.  The risk of having a heart attack, stroke, cancer, or lung disease is greatly reduced.  After 1 year, the risk of coronary heart disease is cut in half.  After 5 years, the risk of stroke falls to the same as a nonsmoker.  After 10 years, the risk of lung cancer is cut in half and the risk of other cancers decreases significantly.  After 15 years, the risk of coronary heart disease drops, usually to the level of a nonsmoker.  If you are pregnant, quitting smoking will improve your chances of having a healthy baby.  The people you live with, especially any children, will be healthier.  You will have extra money to spend on things other than cigarettes. QUESTIONS TO THINK ABOUT BEFORE ATTEMPTING TO QUIT You may want to talk about your answers with your  health care provider.  Why do you want to quit?  If you tried to quit in the past, what helped and what did not?  What will be the most difficult situations for you after you quit? How will you plan to handle them?  Who can help you through the tough times? Your family? Friends? A health care provider?  What pleasures do you get from smoking? What ways can you still get pleasure if you quit? Here are some questions to ask your health care provider:  How can you help me to be successful at quitting?  What medicine do you think would be best for me and how should I take it?  What should I do if I need more help?  What is smoking withdrawal like? How can I get information on withdrawal? GET READY  Set a quit date.  Change your environment by getting rid of all cigarettes, ashtrays, matches, and lighters in your home, car, or work. Do not let people smoke in your home.  Review your past attempts to quit. Think about what worked and what did not. GET SUPPORT AND ENCOURAGEMENT You have a better chance of being successful if you have help. You can get support in many ways.  Tell your family, friends, and coworkers that you are going to quit and need their support. Ask them not to smoke around you.  Get individual, group, or telephone counseling and support. Programs are available at local hospitals and health centers. Call   your local health department for information about programs in your area.  Spiritual beliefs and practices may help some smokers quit.  Download a "quit meter" on your computer to keep track of quit statistics, such as how long you have gone without smoking, cigarettes not smoked, and money saved.  Get a self-help book about quitting smoking and staying off tobacco. LEARN NEW SKILLS AND BEHAVIORS  Distract yourself from urges to smoke. Talk to someone, go for a walk, or occupy your time with a task.  Change your normal routine. Take a different route to work.  Drink tea instead of coffee. Eat breakfast in a different place.  Reduce your stress. Take a hot bath, exercise, or read a book.  Plan something enjoyable to do every day. Reward yourself for not smoking.  Explore interactive web-based programs that specialize in helping you quit. GET MEDICINE AND USE IT CORRECTLY Medicines can help you stop smoking and decrease the urge to smoke. Combining medicine with the above behavioral methods and support can greatly increase your chances of successfully quitting smoking.  Nicotine replacement therapy helps deliver nicotine to your body without the negative effects and risks of smoking. Nicotine replacement therapy includes nicotine gum, lozenges, inhalers, nasal sprays, and skin patches. Some may be available over-the-counter and others require a prescription.  Antidepressant medicine helps people abstain from smoking, but how this works is unknown. This medicine is available by prescription.  Nicotinic receptor partial agonist medicine simulates the effect of nicotine in your brain. This medicine is available by prescription. Ask your health care provider for advice about which medicines to use and how to use them based on your health history. Your health care provider will tell you what side effects to look out for if you choose to be on a medicine or therapy. Carefully read the information on the package. Do not use any other product containing nicotine while using a nicotine replacement product.  RELAPSE OR DIFFICULT SITUATIONS Most relapses occur within the first 3 months after quitting. Do not be discouraged if you start smoking again. Remember, most people try several times before finally quitting. You may have symptoms of withdrawal because your body is used to nicotine. You may crave cigarettes, be irritable, feel very hungry, cough often, get headaches, or have difficulty concentrating. The withdrawal symptoms are only temporary. They are strongest  when you first quit, but they will go away within 10-14 days. To reduce the chances of relapse, try to:  Avoid drinking alcohol. Drinking lowers your chances of successfully quitting.  Reduce the amount of caffeine you consume. Once you quit smoking, the amount of caffeine in your body increases and can give you symptoms, such as a rapid heartbeat, sweating, and anxiety.  Avoid smokers because they can make you want to smoke.  Do not let weight gain distract you. Many smokers will gain weight when they quit, usually less than 10 pounds. Eat a healthy diet and stay active. You can always lose the weight gained after you quit.  Find ways to improve your mood other than smoking. FOR MORE INFORMATION  www.smokefree.gov  Document Released: 12/11/2001 Document Revised: 05/03/2014 Document Reviewed: 03/27/2012 Exit[employer] Patient Information 2015 [employer]. This information is not intended to replace advice given to you by your health care provider. Make sure you discuss any questions you have with your health care provider.  

## 2015-04-19 DIAGNOSIS — F250 Schizoaffective disorder, bipolar type: Secondary | ICD-10-CM | POA: Diagnosis not present

## 2015-04-28 ENCOUNTER — Telehealth: Payer: Self-pay | Admitting: *Deleted

## 2015-04-28 NOTE — Telephone Encounter (Signed)
Recd call from patients mother that she would like patients records sent to Dr. Tressie Stalker office since it is closer.  All records sent with exception of April 15 visit which was not in chart.  Spoke with dr Marin Olp to dictate note on 04/15/15 so I could send.

## 2015-04-28 NOTE — Progress Notes (Signed)
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 NEW PATIENT  : I'm here for a shot  HPI: David [name] is a nice 59 year old white male. He has been hospitalized because of paranoid schizophrenia. 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 It seems as if things started back in February 2015 when he had an upper GI bleed. He was found to have thrombocytopenia. He was given IVIG. Because of his psychological condition, he was not thought to be a candidate for steroids.  He subsequently has been getting Nplate.  He now lives up in [place]. As such, his care was transferred to our clinic.  He's done quite well. He's had no bleeding. He's had no bruising. There's been no rashes.  He's had no change in medications.  He had been followed by Dr. Sterling Big, at [facility]. Again, she has been recommending  Nplate for him. This has work quite nicely. His plan account really has come up well.  He has had no cough. He has had no shortness of breath.  He has not had any recent tobacco use. There is no alcohol use.  He's had no change in his medication.    Past Medical History  Diagnosis Date  . Schizophrenia   . Pneumonia   . Anxiety   . GERD (gastroesophageal reflux disease)   . COPD (chronic obstructive pulmonary disease)   :  Past Surgical History  Procedure Laterality Date  . Tonsillectomy    :   Current outpatient prescriptions:  .  acetaminophen (TYLENOL) 325 MG tablet, Take 650 mg by mouth as needed. , Disp: , Rfl:  .  albuterol (PROVENTIL HFA;VENTOLIN HFA) 108 (90 BASE) MCG/ACT inhaler, Inhale 2 puffs into the lungs every 6 (six) hours as needed. , Disp: , Rfl:  .  benztropine (COGENTIN) 1 MG tablet, Take 1 mg by mouth 2 (two) times daily., Disp: , Rfl:  .  ferrous sulfate 325 (65 FE) MG tablet, Take 325 mg by mouth., Disp: , Rfl:  .  fluticasone-salmeterol (ADVAIR HFA) 230-21 MCG/ACT inhaler, Inhale 2 puffs into the  lungs 2 (two) times daily., Disp: , Rfl:  .  gabapentin (NEURONTIN) 300 MG capsule, Take 300 mg by mouth at bedtime., Disp: , Rfl:  .  haloperidol decanoate (HALDOL DECANOATE) 100 MG/ML injection, Inject 150 mg into the muscle every 28 (twenty-eight) days. Give one IM injection of 100mg .every month.  Last given 03/07/2012, next due 04/07/2012., Disp: , Rfl:  .  hydrochlorothiazide (HYDRODIURIL) 25 MG tablet, Take 25 mg by mouth daily., Disp: , Rfl:  .  lansoprazole (PREVACID) 30 MG capsule, Take 30 mg by mouth as needed. , Disp: , Rfl:  .  LORazepam (ATIVAN) 2 MG tablet, Take 2 mg by mouth every 8 (eight) hours as needed. , Disp: , Rfl:  .  losartan (COZAAR) 50 MG tablet, Take 50 mg by mouth daily., Disp: , Rfl:  .  Magnesium Hydroxide 2400 MG/10ML SUSP, Take 3,600 mg by mouth as needed. , Disp: , Rfl:  .  Multiple Vitamin (MULTIVITAMIN WITH MINERALS) TABS tablet, Take 1 tablet by mouth every morning., Disp: , Rfl:  .  OLANZapine (ZYPREXA) 10 MG tablet, Take 10 mg by mouth at bedtime., Disp: , Rfl:  .  omeprazole (PRILOSEC) 20 MG capsule, Take 20 mg by mouth daily., Disp: , Rfl:  .  ondansetron (ZOFRAN) 4 MG tablet, Take by mouth., Disp: , Rfl:  .  polyethylene glycol (MIRALAX / GLYCOLAX) packet, Take by mouth., Disp: , Rfl:  .  tiotropium (SPIRIVA) 18 MCG inhalation capsule, Place 18 mcg into inhaler and inhale daily., Disp: , Rfl:  .  [DISCONTINUED] haloperidol lactate (HALDOL) 5 MG/ML injection, Inject 150 mg into the skin every 30 (thirty) days., Disp: , Rfl:  .  [DISCONTINUED] zolpidem (AMBIEN) 5 MG tablet, Take 5 mg by mouth at bedtime as needed., Disp: , Rfl: :  :  Allergies  Allergen Reactions  . Trazodone Nausea And Vomiting  . Trazodone And Nefazodone Nausea And Vomiting  . Lithium Nausea Only, Nausea And Vomiting and Other (See Comments)    "get's very sick"  :  Family History  Problem Relation Age of Onset  . Arthritis Mother   . Hypertension Mother   :  History   Social  History  . Marital Status: Legally Separated    Spouse Name: N/A  . Number of Children: N/A  . Years of Education: N/A   Occupational History  . Not on file.   Social History Main Topics  . Smoking status: Current Every Day Smoker -- 1.00 packs/day for 40 years    Types: Cigarettes    Start date: 04/14/1974  . Smokeless tobacco: Never Used  . Alcohol Use: No  . Drug Use: No  . Sexual Activity: No   Other Topics Concern  . Not on file   Social History Narrative  :  Pertinent items are noted in HPI.  Exam: @IPVITALS @ Well-developed and well-nourished white gentleman in no obvious distress. Head and neck exam shows no ocular or oral lesions. There are no palpable cervical or supraclavicular lymph nodes. His vital signs show a temperature of 98.6. Pulse 89. Blood pressure 149/75. Weight is 206 pounds.lungs are clear bilaterally. Cardiac exam regular rate and rhythm. There are no murmurs, rubs or bruits. Abdomen is soft. He has good bowel sounds. There is no fluid wave. There is no palpable liver or spleen tip. Back exam shows no tenderness over the spine, ribs or hips. Extremities shows no clubbing, cyanosis or edema. Skin exam shows no rashes, ecchymoses or petechia. Neurological exam shows no focal neurological deficits.  No results for input(s): WBC, HGB, HCT, PLT in the last 72 hours. No results for input(s): NA, K, CL, CO2, GLUCOSE, BUN, CREATININE, CALCIUM in the last 72 hours.  Blood smear review: normochromic and normocytic population of red blood cells. There are no nucleated blood cells. She has no rouleau formation. There is no teardrop cells. I see no target cells. He has no schistocytes or spherocytes. White cells show no hypersegmented polys. There is no immature myeloid cells. There is no atypical lymphocytes. Platelets are adequate in number and size.he has a few large platelets.  Pathology:none    Assessment and Plan: David [name] is a 59 year old gentleman. He has  chronic ITP. His platelet count looks fantastic today. He does not need any Nplate from my point of view.  I am still not sure how the diagnosis of chronic ITP was made.  We will transfer his care up to the [facility] in [place].. This will be so much easier for him and his mom. I just want to do whatever we can to make it easier for him.  I would like to think that he will not need much in the way of  Nplate.  I would only give Nplate if his platelet count is below 100,000.  I spent about 45 minutes with he  and his mom.

## 2015-05-03 ENCOUNTER — Telehealth: Payer: Self-pay | Admitting: Family Medicine

## 2015-05-03 NOTE — Telephone Encounter (Signed)
Appointment given for 5/13 @1 :3 with [facility].

## 2015-05-05 DIAGNOSIS — F329 Major depressive disorder, single episode, unspecified: Secondary | ICD-10-CM | POA: Diagnosis not present

## 2015-05-05 DIAGNOSIS — R5383 Other fatigue: Secondary | ICD-10-CM | POA: Diagnosis not present

## 2015-05-05 DIAGNOSIS — R531 Weakness: Secondary | ICD-10-CM | POA: Diagnosis not present

## 2015-05-05 DIAGNOSIS — F2089 Other schizophrenia: Secondary | ICD-10-CM | POA: Diagnosis not present

## 2015-05-05 DIAGNOSIS — R69 Illness, unspecified: Secondary | ICD-10-CM | POA: Diagnosis not present

## 2015-05-05 DIAGNOSIS — Z87898 Personal history of other specified conditions: Secondary | ICD-10-CM | POA: Diagnosis not present

## 2015-05-05 DIAGNOSIS — R740 Nonspecific elevation of levels of transaminase and lactic acid dehydrogenase [LDH]: Secondary | ICD-10-CM | POA: Diagnosis not present

## 2015-05-05 DIAGNOSIS — Z862 Personal history of diseases of the blood and blood-forming organs and certain disorders involving the immune mechanism: Secondary | ICD-10-CM | POA: Diagnosis not present

## 2015-05-05 DIAGNOSIS — D721 Eosinophilia: Secondary | ICD-10-CM | POA: Diagnosis not present

## 2015-05-05 DIAGNOSIS — D693 Immune thrombocytopenic purpura: Secondary | ICD-10-CM | POA: Diagnosis not present

## 2015-05-13 ENCOUNTER — Encounter: Payer: Self-pay | Admitting: Family Medicine

## 2015-05-13 ENCOUNTER — Ambulatory Visit (INDEPENDENT_AMBULATORY_CARE_PROVIDER_SITE_OTHER): Payer: Medicare Other | Admitting: Family Medicine

## 2015-05-13 VITALS — BP 133/93 | HR 90 | Temp 99.0°F | Ht 70.0 in | Wt 202.2 lb

## 2015-05-13 DIAGNOSIS — K219 Gastro-esophageal reflux disease without esophagitis: Secondary | ICD-10-CM | POA: Diagnosis not present

## 2015-05-13 LAB — POCT CBC
Granulocyte percent: 81.9 %G — AB (ref 37–80)
HEMATOCRIT: 38 % — AB (ref 43.5–53.7)
HEMOGLOBIN: 11.3 g/dL — AB (ref 14.1–18.1)
LYMPH, POC: 2.3 (ref 0.6–3.4)
MCH: 25.4 pg — AB (ref 27–31.2)
MCHC: 29.7 g/dL — AB (ref 31.8–35.4)
MCV: 85.4 fL (ref 80–97)
MPV: 8.3 fL (ref 0–99.8)
POC GRANULOCYTE: 12.9 — AB (ref 2–6.9)
POC LYMPH PERCENT: 14.5 %L (ref 10–50)
Platelet Count, POC: 48 10*3/uL — AB (ref 142–424)
RBC: 4.45 M/uL — AB (ref 4.69–6.13)
RDW, POC: 19.3 %
WBC: 15.8 10*3/uL — AB (ref 4.6–10.2)

## 2015-05-13 MED ORDER — ALBUTEROL SULFATE HFA 108 (90 BASE) MCG/ACT IN AERS: 2.0000 | INHALATION_SPRAY | Freq: Four times a day (QID) | RESPIRATORY_TRACT | Status: DC | PRN

## 2015-05-13 MED ORDER — FERROUS SULFATE 325 (65 FE) MG PO TABS: 325.0000 mg | ORAL_TABLET | Freq: Two times a day (BID) | ORAL | Status: DC

## 2015-05-13 MED ORDER — OMEPRAZOLE 20 MG PO CPDR: 20.0000 mg | DELAYED_RELEASE_CAPSULE | Freq: Every day | ORAL | Status: DC

## 2015-05-13 MED ORDER — LOSARTAN POTASSIUM 50 MG PO TABS: 50.0000 mg | ORAL_TABLET | Freq: Every day | ORAL | Status: DC

## 2015-05-13 NOTE — Progress Notes (Signed)
Subjective:  Patient ID: David [name], male    DOB: 1956-05-23  Age: 59 y.o. MRN: [id number]  CC: Hospitalization Follow-up   HPI David [name] presents for recheck of his GI tract after recent hospitalization. He  has been hospitalized recently for paranoid schizophrenia. He has moderate epigastric pain and substernal discomfort. No nausea. There has been heartburn and cramping sensation. This radiates somewhat to the right upper quadrant. He denies constipation and diarrhea. Symptoms are intermittent and increasing in frequency and severity.  History David [name] has a past medical history of Schizophrenia; Pneumonia; Anxiety; GERD (gastroesophageal reflux disease); and COPD (chronic obstructive pulmonary disease).   He has past surgical history that includes Tonsillectomy.   His family history includes Arthritis in his mother; Hypertension in his mother.He reports that he has been smoking Cigarettes.  He started smoking about 41 years ago. He has a 40 pack-year smoking history. He has never used smokeless tobacco. He reports that he does not drink alcohol or use illicit drugs.  Current Outpatient Prescriptions on File Prior to Visit  Medication Sig Dispense Refill  . acetaminophen (TYLENOL) 325 MG tablet Take 650 mg by mouth as needed.     . gabapentin (NEURONTIN) 300 MG capsule Take 300 mg by mouth 2 (two) times daily.     . Multiple Vitamin (MULTIVITAMIN WITH MINERALS) TABS tablet Take 1 tablet by mouth every morning.    [name] OLANZapine (ZYPREXA) 10 MG tablet Take 10 mg by mouth at bedtime.    . [DISCONTINUED] zolpidem (AMBIEN) 5 MG tablet Take 5 mg by mouth at bedtime as needed.     No current facility-administered medications on file prior to visit.    ROS Review of Systems  Constitutional: Negative for fever, chills and diaphoresis.  HENT: Negative for congestion, rhinorrhea and sore throat.   Respiratory: Negative for cough, shortness of breath and wheezing.   Cardiovascular:  Negative for chest pain.  Gastrointestinal: Negative for nausea, vomiting, abdominal pain, diarrhea, constipation and abdominal distention.  Genitourinary: Negative for dysuria and frequency.  Musculoskeletal: Negative for joint swelling and arthralgias.  Skin: Negative for rash.  Neurological: Negative for headaches.    Objective:  BP 133/93 mmHg  Pulse 90  Temp(Src) 99 F (37.2 C) (Oral)  Ht $R'5\' 10"'xY$  (1.778 m)  Wt 202 lb 3.2 oz (91.717 kg)  BMI 29.01 kg/m2  BP Readings from Last 3 Encounters:  05/13/15 133/93  04/15/15 149/75  01/05/14 181/93    Wt Readings from Last 3 Encounters:  05/13/15 202 lb 3.2 oz (91.717 kg)  04/15/15 206 lb (93.441 kg)  09/07/13 161 lb (73.029 kg)     Physical Exam  Constitutional: He is oriented to person, place, and time. He appears well-developed and well-nourished. No distress.  HENT:  Head: Normocephalic and atraumatic.  Right Ear: External ear normal.  Left Ear: External ear normal.  Nose: Nose normal.  Mouth/Throat: Oropharynx is clear and moist.  Eyes: Conjunctivae and EOM are normal. Pupils are equal, round, and reactive to light.  Neck: Normal range of motion. Neck supple. No thyromegaly present.  Cardiovascular: Normal rate, regular rhythm and normal heart sounds.   No murmur heard. Pulmonary/Chest: Effort normal and breath sounds normal. No respiratory distress. He has no wheezes. He has no rales.  Abdominal: Soft. Bowel sounds are normal. He exhibits distension. He exhibits no mass. There is tenderness (epigastrum). There is no rebound and no guarding.  Lymphadenopathy:    He has no cervical adenopathy.  Neurological: He  is alert and oriented to person, place, and time. He has normal reflexes.  Skin: Skin is warm and dry.  Psychiatric: He has a normal mood and affect. His behavior is normal. Judgment and thought content normal.    Lab Results  Component Value Date   HGBA1C 5.2 02/23/2012    Lab Results  Component Value  Date   WBC 15.8* 05/13/2015   HGB 11.3* 05/13/2015   HCT 38.0* 05/13/2015   PLT 379 04/15/2015   GLUCOSE 100* 05/13/2015   CHOL 163 02/23/2012   TRIG 132 02/23/2012   HDL 29* 02/23/2012   LDLCALC 108* 02/23/2012   ALT 18 05/13/2015   AST 13 05/13/2015   NA 139 05/13/2015   K 4.4 05/13/2015   CL 99 05/13/2015   CREATININE 1.47* 05/13/2015   BUN 22 05/13/2015   CO2 24 05/13/2015   HGBA1C 5.2 02/23/2012    No results found.  Assessment & Plan:   David [name] was seen today for hospitalization follow-up.  Diagnoses and all orders for this visit:  Gastroesophageal reflux disease without esophagitis Orders: -     POCT CBC -     CMP14+EGFR  Other orders -     ferrous sulfate 325 (65 FE) MG tablet; Take 1 tablet (325 mg total) by mouth 2 (two) times daily with a meal. -     albuterol (PROVENTIL HFA;VENTOLIN HFA) 108 (90 BASE) MCG/ACT inhaler; Inhale 2 puffs into the lungs every 6 (six) hours as needed. -     losartan (COZAAR) 50 MG tablet; Take 1 tablet (50 mg total) by mouth daily. -     omeprazole (PRILOSEC) 20 MG capsule; Take 1 capsule (20 mg total) by mouth daily.   I have discontinued David [name] haloperidol decanoate, LORazepam, Magnesium Hydroxide, polyethylene glycol, ondansetron, lansoprazole, ferrous sulfate, omeprazole, tiotropium, losartan, hydrochlorothiazide, fluticasone-salmeterol, hydrochlorothiazide, and losartan. I have also changed his ferrous sulfate, losartan, and omeprazole. Additionally, I am having him maintain his multivitamin with minerals, acetaminophen, OLANZapine, gabapentin, haloperidol, benztropine, haloperidol lactate, and albuterol.  Meds ordered this encounter  Medications  . haloperidol (HALDOL) 2 MG tablet    Sig: Take 2 mg by mouth. Take 2tablets at bedtime  . benztropine (COGENTIN) 0.5 MG tablet    Sig: Take 0.5 mg by mouth.  . DISCONTD: ferrous sulfate 325 (65 FE) MG tablet    Sig: Take 325 mg by mouth.  . DISCONTD: gabapentin (NEURONTIN)  300 MG capsule    Sig: Take 300 mg by mouth.  . DISCONTD: hydrochlorothiazide (HYDRODIURIL) 25 MG tablet    Sig: Take 25 mg by mouth.  . DISCONTD: losartan (COZAAR) 25 MG tablet    Sig: Take 25 mg by mouth.  . DISCONTD: OLANZapine (ZYPREXA) 10 MG tablet    Sig: Take 10 mg by mouth.  . DISCONTD: omeprazole (PRILOSEC) 20 MG capsule    Sig: Take 20 mg by mouth.  . DISCONTD: losartan (COZAAR) 50 MG tablet    Sig: Take 50 mg by mouth daily.  . haloperidol lactate (HALDOL) 5 MG/ML injection    Sig: 150 mg every 28 (twenty-eight) days.  . ferrous sulfate 325 (65 FE) MG tablet    Sig: Take 1 tablet (325 mg total) by mouth 2 (two) times daily with a meal.    Dispense:  60 tablet    Refill:  5  . albuterol (PROVENTIL HFA;VENTOLIN HFA) 108 (90 BASE) MCG/ACT inhaler    Sig: Inhale 2 puffs into the lungs every 6 (six) hours as needed.  Dispense:  1 Inhaler    Refill:  11  . losartan (COZAAR) 50 MG tablet    Sig: Take 1 tablet (50 mg total) by mouth daily.    Dispense:  30 tablet    Refill:  5  . omeprazole (PRILOSEC) 20 MG capsule    Sig: Take 1 capsule (20 mg total) by mouth daily.    Dispense:  30 capsule    Refill:  11     Follow-up: Return in about 3 months (around 08/13/2015).  Claretta Fraise, M.D.

## 2015-05-14 LAB — CMP14+EGFR
ALT: 18 IU/L (ref 0–44)
AST: 13 IU/L (ref 0–40)
Albumin/Globulin Ratio: 2 (ref 1.1–2.5)
Albumin: 4.1 g/dL (ref 3.5–5.5)
Alkaline Phosphatase: 92 IU/L (ref 39–117)
BUN/Creatinine Ratio: 15 (ref 9–20)
BUN: 22 mg/dL (ref 6–24)
Bilirubin Total: 0.2 mg/dL (ref 0.0–1.2)
CHLORIDE: 99 mmol/L (ref 97–108)
CO2: 24 mmol/L (ref 18–29)
Calcium: 8.8 mg/dL (ref 8.7–10.2)
Creatinine, Ser: 1.47 mg/dL — ABNORMAL HIGH (ref 0.76–1.27)
GFR calc Af Amer: 60 mL/min/{1.73_m2} (ref >59)
GFR, EST NON AFRICAN AMERICAN: 52 mL/min/{1.73_m2} — AB (ref >59)
GLUCOSE: 100 mg/dL — AB (ref 65–99)
Globulin, Total: 2.1 g/dL (ref 1.5–4.5)
POTASSIUM: 4.4 mmol/L (ref 3.5–5.2)
Sodium: 139 mmol/L (ref 134–144)
TOTAL PROTEIN: 6.2 g/dL (ref 6.0–8.5)

## 2015-05-17 ENCOUNTER — Telehealth: Payer: Self-pay | Admitting: *Deleted

## 2015-05-17 NOTE — Telephone Encounter (Signed)
-----   Message from Claretta Fraise, MD sent at 05/16/2015  7:06 PM EDT ----- The WBC is chronically high. The platelets are very low. Based on chart review he is a candidate for Nplate injection. He is apparently transferring his care for his chronic ITP to the [facility] in [place]. He will need an appointment there very soon because of the low platelets. Please arrange expedited referral. Thanks. Claretta Fraise, M.D..

## 2015-05-17 NOTE — Telephone Encounter (Signed)
Pt notified of results Verbalizes understanding Pt is seeing Dr Vaughan Basta at [facility] Last appt was on 05/05/2015 Next appt is 06/02/2015 Labs forwarded to [facility]

## 2015-05-23 DIAGNOSIS — D693 Immune thrombocytopenic purpura: Secondary | ICD-10-CM | POA: Diagnosis not present

## 2015-05-23 DIAGNOSIS — R69 Illness, unspecified: Secondary | ICD-10-CM | POA: Diagnosis not present

## 2015-05-23 DIAGNOSIS — F2089 Other schizophrenia: Secondary | ICD-10-CM | POA: Diagnosis not present

## 2015-05-23 DIAGNOSIS — R740 Nonspecific elevation of levels of transaminase and lactic acid dehydrogenase [LDH]: Secondary | ICD-10-CM | POA: Diagnosis not present

## 2015-05-23 DIAGNOSIS — Z87898 Personal history of other specified conditions: Secondary | ICD-10-CM | POA: Diagnosis not present

## 2015-06-02 DIAGNOSIS — Z862 Personal history of diseases of the blood and blood-forming organs and certain disorders involving the immune mechanism: Secondary | ICD-10-CM | POA: Diagnosis not present

## 2015-06-02 DIAGNOSIS — D693 Immune thrombocytopenic purpura: Secondary | ICD-10-CM | POA: Diagnosis not present

## 2015-06-16 DIAGNOSIS — F2089 Other schizophrenia: Secondary | ICD-10-CM | POA: Diagnosis not present

## 2015-06-16 DIAGNOSIS — R69 Illness, unspecified: Secondary | ICD-10-CM | POA: Diagnosis not present

## 2015-06-16 DIAGNOSIS — R531 Weakness: Secondary | ICD-10-CM | POA: Diagnosis not present

## 2015-06-16 DIAGNOSIS — D721 Eosinophilia: Secondary | ICD-10-CM | POA: Diagnosis not present

## 2015-06-16 DIAGNOSIS — Z87898 Personal history of other specified conditions: Secondary | ICD-10-CM | POA: Diagnosis not present

## 2015-06-16 DIAGNOSIS — D693 Immune thrombocytopenic purpura: Secondary | ICD-10-CM | POA: Diagnosis not present

## 2015-06-16 LAB — PROTIME-INR: INR: 0.9 (ref 0.9–1.1)

## 2015-06-23 DIAGNOSIS — D693 Immune thrombocytopenic purpura: Secondary | ICD-10-CM | POA: Diagnosis not present

## 2015-06-30 DIAGNOSIS — D693 Immune thrombocytopenic purpura: Secondary | ICD-10-CM | POA: Diagnosis not present

## 2015-07-07 DIAGNOSIS — D693 Immune thrombocytopenic purpura: Secondary | ICD-10-CM | POA: Diagnosis not present

## 2015-07-15 DIAGNOSIS — Z720 Tobacco use: Secondary | ICD-10-CM | POA: Diagnosis not present

## 2015-07-15 DIAGNOSIS — Z862 Personal history of diseases of the blood and blood-forming organs and certain disorders involving the immune mechanism: Secondary | ICD-10-CM | POA: Diagnosis not present

## 2015-07-15 DIAGNOSIS — F2089 Other schizophrenia: Secondary | ICD-10-CM | POA: Diagnosis not present

## 2015-07-15 DIAGNOSIS — Z87898 Personal history of other specified conditions: Secondary | ICD-10-CM | POA: Diagnosis not present

## 2015-07-25 NOTE — Patient Outreach (Signed)
[facility]) Care Management  07/25/2015  David [name] 1956/07/24 [id number]   Referral from [place] [facility], assigned Burgess Amor, RN to outreach.  David [name]. [place], [place] Management [place] Assistant Phone: [phone] Fax: [phone]

## 2015-07-28 ENCOUNTER — Other Ambulatory Visit: Payer: Self-pay | Admitting: *Deleted

## 2015-07-28 NOTE — Patient Outreach (Addendum)
Call for [facility] program services screening. Unable to reach, left VM with RNCM contact. Will await call back this week. Plan to call again next week. Royetta Crochet. Laymond Purser, RN, BSN, [place] [phone]

## 2015-08-02 ENCOUNTER — Other Ambulatory Visit: Payer: Self-pay | Admitting: *Deleted

## 2015-08-02 NOTE — Patient Outreach (Signed)
[facility] screening call. Call to patient, unable to reach, left voicemail.  Plan to await call back from patient this week If no call back will attempt again x1 next week. Royetta Crochet. Laymond Purser, RN, BSN, [place] [phone]

## 2015-08-04 DIAGNOSIS — D693 Immune thrombocytopenic purpura: Secondary | ICD-10-CM | POA: Diagnosis not present

## 2015-08-04 DIAGNOSIS — Z862 Personal history of diseases of the blood and blood-forming organs and certain disorders involving the immune mechanism: Secondary | ICD-10-CM | POA: Diagnosis not present

## 2015-08-04 DIAGNOSIS — R740 Nonspecific elevation of levels of transaminase and lactic acid dehydrogenase [LDH]: Secondary | ICD-10-CM | POA: Diagnosis not present

## 2015-08-04 DIAGNOSIS — Z87898 Personal history of other specified conditions: Secondary | ICD-10-CM | POA: Diagnosis not present

## 2015-08-04 DIAGNOSIS — R69 Illness, unspecified: Secondary | ICD-10-CM | POA: Diagnosis not present

## 2015-08-04 DIAGNOSIS — F2089 Other schizophrenia: Secondary | ICD-10-CM | POA: Diagnosis not present

## 2015-08-10 ENCOUNTER — Other Ambulatory Visit: Payer: Self-pay | Admitting: *Deleted

## 2015-08-10 NOTE — Patient Outreach (Signed)
Call to patient for [facility] HRL screening. No answer, left VM w/RNCM contact. Plan  This was third outreach, third message left, will mail letter to patient and close referral after 10 days if no return contact. Royetta Crochet. Laymond Purser, RN, BSN, [place] [phone]

## 2015-08-15 ENCOUNTER — Ambulatory Visit (INDEPENDENT_AMBULATORY_CARE_PROVIDER_SITE_OTHER): Payer: Medicare Other | Admitting: Family Medicine

## 2015-08-15 ENCOUNTER — Encounter: Payer: Self-pay | Admitting: Family Medicine

## 2015-08-15 VITALS — BP 106/64 | HR 106 | Temp 98.9°F | Ht 70.0 in | Wt 199.0 lb

## 2015-08-15 DIAGNOSIS — K219 Gastro-esophageal reflux disease without esophagitis: Secondary | ICD-10-CM | POA: Diagnosis not present

## 2015-08-15 DIAGNOSIS — I10 Essential (primary) hypertension: Secondary | ICD-10-CM

## 2015-08-15 DIAGNOSIS — G4700 Insomnia, unspecified: Secondary | ICD-10-CM | POA: Diagnosis not present

## 2015-08-15 DIAGNOSIS — N182 Chronic kidney disease, stage 2 (mild): Secondary | ICD-10-CM

## 2015-08-15 MED ORDER — LOSARTAN POTASSIUM 50 MG PO TABS: 50.0000 mg | ORAL_TABLET | Freq: Every day | ORAL | Status: DC

## 2015-08-15 MED ORDER — OMEPRAZOLE 20 MG PO CPDR: 20.0000 mg | DELAYED_RELEASE_CAPSULE | Freq: Every day | ORAL | Status: DC

## 2015-08-15 MED ORDER — FERROUS SULFATE 325 (65 FE) MG PO TABS: 325.0000 mg | ORAL_TABLET | Freq: Two times a day (BID) | ORAL | Status: DC

## 2015-08-15 NOTE — Progress Notes (Signed)
Subjective:  Patient ID: David [name], male    DOB: 08/09/1956  Age: 59 y.o. MRN: [id number]  CC: Follow-up   HPI David [name] presents for  follow-up of hypertension. Patient has no history of headache chest pain or shortness of breath or recent cough. Patient also denies symptoms of TIA such as numbness weakness lateralizing. Patient checks  blood pressure at home and has not had any elevated readings recently. Patient denies side effects from his medication. States taking it regularly.  Vomited once since starting the trazodone on 08/03/15. Concerned med was the cause. It is helping with sleep.   History David [name] has a past medical history of Schizophrenia; Pneumonia; Anxiety; GERD (gastroesophageal reflux disease); and COPD (chronic obstructive pulmonary disease).   He has past surgical history that includes Tonsillectomy.   His family history includes Arthritis in his mother; Hypertension in his mother.He reports that he has been smoking Cigarettes.  He started smoking about 41 years ago. He has a 40 pack-year smoking history. He has never used smokeless tobacco. He reports that he does not drink alcohol or use illicit drugs.  Current Outpatient Prescriptions on File Prior to Visit  Medication Sig Dispense Refill  . acetaminophen (TYLENOL) 325 MG tablet Take 650 mg by mouth as needed.     [name] albuterol (PROVENTIL HFA;VENTOLIN HFA) 108 (90 BASE) MCG/ACT inhaler Inhale 2 puffs into the lungs every 6 (six) hours as needed. 1 Inhaler 11  . benztropine (COGENTIN) 0.5 MG tablet Take 0.5 mg by mouth.    . gabapentin (NEURONTIN) 300 MG capsule Take 300 mg by mouth 2 (two) times daily.     . haloperidol (HALDOL) 2 MG tablet Take 2 mg by mouth. Take 2tablets at bedtime    . haloperidol lactate (HALDOL) 5 MG/ML injection 150 mg every 28 (twenty-eight) days.    . Multiple Vitamin (MULTIVITAMIN WITH MINERALS) TABS tablet Take 1 tablet by mouth every morning.    [name] OLANZapine (ZYPREXA) 10 MG  tablet Take 10 mg by mouth at bedtime.    . [DISCONTINUED] zolpidem (AMBIEN) 5 MG tablet Take 5 mg by mouth at bedtime as needed.     No current facility-administered medications on file prior to visit.    ROS Review of Systems  Constitutional: Negative for fever, chills and diaphoresis.  HENT: Negative for congestion, rhinorrhea and sore throat.   Respiratory: Negative for cough, shortness of breath and wheezing.   Cardiovascular: Negative for chest pain.  Gastrointestinal: Negative for nausea, vomiting, abdominal pain, diarrhea, constipation and abdominal distention.  Genitourinary: Negative for dysuria and frequency.  Musculoskeletal: Negative for joint swelling and arthralgias.  Skin: Negative for rash.  Neurological: Negative for headaches.    Objective:  BP 106/64 mmHg  Pulse 106  Temp(Src) 98.9 F (37.2 C) (Oral)  Ht 5\' 10"  (1.778 m)  Wt 199 lb (90.266 kg)  BMI 28.55 kg/m2  BP Readings from Last 3 Encounters:  08/15/15 106/64  05/13/15 133/93  04/15/15 149/75    Wt Readings from Last 3 Encounters:  08/15/15 199 lb (90.266 kg)  05/13/15 202 lb 3.2 oz (91.717 kg)  04/15/15 206 lb (93.441 kg)     Physical Exam  Constitutional: He is oriented to person, place, and time. He appears well-developed and well-nourished. No distress.  HENT:  Head: Normocephalic and atraumatic.  Right Ear: External ear normal.  Left Ear: External ear normal.  Nose: Nose normal.  Mouth/Throat: Oropharynx is clear and moist.  Eyes: Conjunctivae and EOM are  normal. Pupils are equal, round, and reactive to light.  Neck: Normal range of motion. Neck supple. No thyromegaly present.  Cardiovascular: Normal rate, regular rhythm and normal heart sounds.   No murmur heard. Pulmonary/Chest: Effort normal and breath sounds normal. No respiratory distress. He has no wheezes. He has no rales.  Abdominal: Soft. Bowel sounds are normal. He exhibits no distension. There is no tenderness.    Lymphadenopathy:    He has no cervical adenopathy.  Neurological: He is alert and oriented to person, place, and time. He has normal reflexes.  Skin: Skin is warm and dry.  Psychiatric: He has a normal mood and affect. His behavior is normal. Judgment and thought content normal.    Lab Results  Component Value Date   HGBA1C 5.2 02/23/2012    Lab Results  Component Value Date   WBC 15.8* 05/13/2015   HGB 11.3* 05/13/2015   HCT 38.0* 05/13/2015   PLT 379 04/15/2015   GLUCOSE 100* 05/13/2015   CHOL 163 02/23/2012   TRIG 132 02/23/2012   HDL 29* 02/23/2012   LDLCALC 108* 02/23/2012   ALT 18 05/13/2015   AST 13 05/13/2015   NA 139 05/13/2015   K 4.4 05/13/2015   CL 99 05/13/2015   CREATININE 1.47* 05/13/2015   BUN 22 05/13/2015   CO2 24 05/13/2015   HGBA1C 5.2 02/23/2012    No results found.  Assessment & Plan:   David [name] was seen today for follow-up.  Diagnoses and all orders for this visit:  Gastroesophageal reflux disease without esophagitis  Essential hypertension  Insomnia  CKD (chronic kidney disease) stage 2, GFR 60-89 ml/min  Other orders -     losartan (COZAAR) 50 MG tablet; Take 1 tablet (50 mg total) by mouth daily. -     omeprazole (PRILOSEC) 20 MG capsule; Take 1 capsule (20 mg total) by mouth daily. -     ferrous sulfate 325 (65 FE) MG tablet; Take 1 tablet (325 mg total) by mouth 2 (two) times daily with a meal.   I am having David [name] maintain his multivitamin with minerals, acetaminophen, OLANZapine, gabapentin, haloperidol, benztropine, haloperidol lactate, albuterol, traZODone, losartan, omeprazole, and ferrous sulfate.  Meds ordered this encounter  Medications  . traZODone (DESYREL) 100 MG tablet    Sig: Take 1 tablet by mouth at bedtime.  [name] losartan (COZAAR) 50 MG tablet    Sig: Take 1 tablet (50 mg total) by mouth daily.    Dispense:  30 tablet    Refill:  5  . omeprazole (PRILOSEC) 20 MG capsule    Sig: Take 1 capsule (20 mg total)  by mouth daily.    Dispense:  30 capsule    Refill:  11  . ferrous sulfate 325 (65 FE) MG tablet    Sig: Take 1 tablet (325 mg total) by mouth 2 (two) times daily with a meal.    Dispense:  60 tablet    Refill:  5     Follow-up: Return in about 6 months (around 02/15/2016).  Claretta Fraise, M.D.

## 2015-08-22 ENCOUNTER — Encounter: Payer: Self-pay | Admitting: *Deleted

## 2015-08-30 NOTE — Patient Outreach (Signed)
[facility]) Care Management  08/30/2015  David [name] 1956/03/16 [id number]   Notification from Burgess Amor, RN to close case due to unable to contact patient for [place] Management services.  Thanks, Ronnell Freshwater. [place], [place] Assistant Phone: [phone] Fax: [phone]

## 2015-10-13 DIAGNOSIS — Z23 Encounter for immunization: Secondary | ICD-10-CM | POA: Diagnosis not present

## 2015-11-04 DIAGNOSIS — Z862 Personal history of diseases of the blood and blood-forming organs and certain disorders involving the immune mechanism: Secondary | ICD-10-CM | POA: Diagnosis not present

## 2015-11-04 DIAGNOSIS — Z87898 Personal history of other specified conditions: Secondary | ICD-10-CM | POA: Diagnosis not present

## 2015-11-04 DIAGNOSIS — R740 Nonspecific elevation of levels of transaminase and lactic acid dehydrogenase [LDH]: Secondary | ICD-10-CM | POA: Diagnosis not present

## 2015-11-04 DIAGNOSIS — R69 Illness, unspecified: Secondary | ICD-10-CM | POA: Diagnosis not present

## 2015-11-04 DIAGNOSIS — D729 Disorder of white blood cells, unspecified: Secondary | ICD-10-CM | POA: Diagnosis not present

## 2015-11-04 DIAGNOSIS — F2089 Other schizophrenia: Secondary | ICD-10-CM | POA: Diagnosis not present

## 2015-11-04 DIAGNOSIS — D693 Immune thrombocytopenic purpura: Secondary | ICD-10-CM | POA: Diagnosis not present

## 2015-12-01 ENCOUNTER — Encounter (HOSPITAL_COMMUNITY): Payer: Self-pay | Admitting: Cardiology

## 2015-12-01 ENCOUNTER — Emergency Department (HOSPITAL_COMMUNITY): Payer: Medicare Other

## 2015-12-01 ENCOUNTER — Emergency Department (HOSPITAL_COMMUNITY)
Admission: EM | Admit: 2015-12-01 | Discharge: 2015-12-03 | Disposition: A | Discharge status: Other Acute Inpatient Hospital | Payer: Medicare Other | Attending: Emergency Medicine | Admitting: Emergency Medicine

## 2015-12-01 DIAGNOSIS — F17210 Nicotine dependence, cigarettes, uncomplicated: Secondary | ICD-10-CM | POA: Diagnosis not present

## 2015-12-01 DIAGNOSIS — Z8701 Personal history of pneumonia (recurrent): Secondary | ICD-10-CM | POA: Insufficient documentation

## 2015-12-01 DIAGNOSIS — J449 Chronic obstructive pulmonary disease, unspecified: Secondary | ICD-10-CM | POA: Insufficient documentation

## 2015-12-01 DIAGNOSIS — K219 Gastro-esophageal reflux disease without esophagitis: Secondary | ICD-10-CM | POA: Diagnosis not present

## 2015-12-01 DIAGNOSIS — F200 Paranoid schizophrenia: Secondary | ICD-10-CM | POA: Insufficient documentation

## 2015-12-01 DIAGNOSIS — F419 Anxiety disorder, unspecified: Secondary | ICD-10-CM | POA: Diagnosis not present

## 2015-12-01 DIAGNOSIS — Z79899 Other long term (current) drug therapy: Secondary | ICD-10-CM | POA: Insufficient documentation

## 2015-12-01 DIAGNOSIS — R4182 Altered mental status, unspecified: Secondary | ICD-10-CM | POA: Diagnosis not present

## 2015-12-01 LAB — ACETAMINOPHEN LEVEL

## 2015-12-01 LAB — CBC WITH DIFFERENTIAL/PLATELET
BASOS PCT: 1 %
Basophils Absolute: 0.1 10*3/uL (ref 0.0–0.1)
EOS ABS: 1.5 10*3/uL — AB (ref 0.0–0.7)
EOS PCT: 10 %
HCT: 45 % (ref 39.0–52.0)
HEMOGLOBIN: 15.2 g/dL (ref 13.0–17.0)
LYMPHS ABS: 1.7 10*3/uL (ref 0.7–4.0)
Lymphocytes Relative: 12 %
MCH: 31.9 pg (ref 26.0–34.0)
MCHC: 33.8 g/dL (ref 30.0–36.0)
MCV: 94.3 fL (ref 78.0–100.0)
MONO ABS: 1.2 10*3/uL — AB (ref 0.1–1.0)
MONOS PCT: 8 %
NEUTROS PCT: 69 %
Neutro Abs: 9.8 10*3/uL — ABNORMAL HIGH (ref 1.7–7.7)
Platelets: 146 10*3/uL — ABNORMAL LOW (ref 150–400)
RBC: 4.77 MIL/uL (ref 4.22–5.81)
RDW: 14 % (ref 11.5–15.5)
WBC: 14.2 10*3/uL — ABNORMAL HIGH (ref 4.0–10.5)

## 2015-12-01 LAB — COMPREHENSIVE METABOLIC PANEL
ALK PHOS: 93 U/L (ref 38–126)
ALT: 23 U/L (ref 17–63)
ANION GAP: 6 (ref 5–15)
AST: 18 U/L (ref 15–41)
Albumin: 3.3 g/dL — ABNORMAL LOW (ref 3.5–5.0)
BUN: 12 mg/dL (ref 6–20)
CALCIUM: 8.5 mg/dL — AB (ref 8.9–10.3)
CO2: 28 mmol/L (ref 22–32)
Chloride: 103 mmol/L (ref 101–111)
Creatinine, Ser: 1.25 mg/dL — ABNORMAL HIGH (ref 0.61–1.24)
GFR calc non Af Amer: >60 mL/min (ref >60)
Glucose, Bld: 96 mg/dL (ref 65–99)
POTASSIUM: 4.3 mmol/L (ref 3.5–5.1)
SODIUM: 137 mmol/L (ref 135–145)
TOTAL PROTEIN: 6.1 g/dL — AB (ref 6.5–8.1)
Total Bilirubin: 0.4 mg/dL (ref 0.3–1.2)

## 2015-12-01 LAB — RAPID URINE DRUG SCREEN, HOSP PERFORMED
Amphetamines: NOT DETECTED
BARBITURATES: NOT DETECTED
Benzodiazepines: NOT DETECTED
Cocaine: NOT DETECTED
Opiates: NOT DETECTED
Tetrahydrocannabinol: NOT DETECTED

## 2015-12-01 LAB — ETHANOL: Alcohol, Ethyl (B): <5 mg/dL (ref <5)

## 2015-12-01 LAB — SALICYLATE LEVEL

## 2015-12-01 MED ORDER — ONDANSETRON HCL 4 MG PO TABS: 4.0000 mg | ORAL_TABLET | Freq: Three times a day (TID) | ORAL | Status: DC | PRN

## 2015-12-01 MED ORDER — NICOTINE 21 MG/24HR TD PT24
21.0000 mg | MEDICATED_PATCH | Freq: Every day | TRANSDERMAL | Status: DC
  Administered 2015-12-02 – 2015-12-03 (×2): 21 mg via TRANSDERMAL
  Filled 2015-12-01 (×2): qty 1

## 2015-12-01 MED ORDER — LORAZEPAM 1 MG PO TABS
1.0000 mg | ORAL_TABLET | Freq: Once | ORAL | Status: AC
  Administered 2015-12-01: 1 mg via ORAL
  Filled 2015-12-01: qty 1

## 2015-12-01 MED ORDER — LOSARTAN POTASSIUM 50 MG PO TABS
50.0000 mg | ORAL_TABLET | Freq: Every day | ORAL | Status: DC
  Administered 2015-12-02 – 2015-12-03 (×2): 50 mg via ORAL
  Filled 2015-12-01 (×4): qty 1

## 2015-12-01 MED ORDER — OLANZAPINE 5 MG PO TABS
10.0000 mg | ORAL_TABLET | Freq: Every day | ORAL | Status: DC
  Administered 2015-12-01 – 2015-12-02 (×2): 10 mg via ORAL
  Filled 2015-12-01 (×2): qty 2

## 2015-12-01 MED ORDER — BENZTROPINE MESYLATE 1 MG PO TABS
0.5000 mg | ORAL_TABLET | Freq: Every day | ORAL | Status: DC
  Administered 2015-12-01 – 2015-12-03 (×3): 0.5 mg via ORAL
  Filled 2015-12-01 (×3): qty 1

## 2015-12-01 MED ORDER — HALOPERIDOL 2 MG PO TABS
ORAL_TABLET | ORAL | Status: AC
  Filled 2015-12-01: qty 1

## 2015-12-01 MED ORDER — GABAPENTIN 300 MG PO CAPS
300.0000 mg | ORAL_CAPSULE | Freq: Two times a day (BID) | ORAL | Status: DC
  Administered 2015-12-01 – 2015-12-03 (×4): 300 mg via ORAL
  Filled 2015-12-01 (×4): qty 1

## 2015-12-01 MED ORDER — PANTOPRAZOLE SODIUM 40 MG PO TBEC
40.0000 mg | DELAYED_RELEASE_TABLET | Freq: Every day | ORAL | Status: DC
  Administered 2015-12-01 – 2015-12-03 (×3): 40 mg via ORAL
  Filled 2015-12-01 (×3): qty 1

## 2015-12-01 MED ORDER — FERROUS SULFATE 325 (65 FE) MG PO TABS
325.0000 mg | ORAL_TABLET | Freq: Two times a day (BID) | ORAL | Status: DC
  Administered 2015-12-02 – 2015-12-03 (×3): 325 mg via ORAL
  Filled 2015-12-01 (×7): qty 1

## 2015-12-01 MED ORDER — NICOTINE 21 MG/24HR TD PT24
21.0000 mg | MEDICATED_PATCH | Freq: Once | TRANSDERMAL | Status: AC
  Administered 2015-12-01: 21 mg via TRANSDERMAL

## 2015-12-01 MED ORDER — ALBUTEROL SULFATE HFA 108 (90 BASE) MCG/ACT IN AERS: 2.0000 | INHALATION_SPRAY | Freq: Four times a day (QID) | RESPIRATORY_TRACT | Status: DC | PRN

## 2015-12-01 MED ORDER — HALOPERIDOL 2 MG PO TABS
2.0000 mg | ORAL_TABLET | Freq: Every day | ORAL | Status: DC
  Administered 2015-12-02 (×2): 2 mg via ORAL
  Filled 2015-12-01 (×4): qty 1

## 2015-12-01 NOTE — ED Notes (Signed)
Patient states he wants to rest for a while. Warm blanket provided and lights dimmed for comfort. Patient much calmer and cooperative at present.

## 2015-12-01 NOTE — BH Assessment (Signed)
Tele Assessment Note   David [name] is an 59 y.o. male. Pt was actively psychotic. Writer was unable to gather necessary information for the assessment. The Pt's speech was tangential, and he had flight of ideas. Pt consistently shouted "I need to go back to [place]." Pt was also shouting obscenities. Pt stated "I them I would kill somebody." When the writer further inquired about who the Pt wanted to kill he became very agitated. Pt constantly told the writer to "get back."  Per Epic notes: The Pt was hospitalized in 2013 at [facility].  Per ED RN: Pt had to be restrained and sedated because of his aggressive behavior.   Pt was irritated and agitated throughout the assessment.  Writer consulted with the May, NP. Per May the Pt meets inpatient criteria. TTS to seek placement.  Diagnosis:  F20.9 Schizophrenia  Past Medical History:  Past Medical History  Diagnosis Date  . Schizophrenia ([place])   . Pneumonia   . Anxiety   . GERD (gastroesophageal reflux disease)   . COPD (chronic obstructive pulmonary disease) [facility])     Past Surgical History  Procedure Laterality Date  . Tonsillectomy      Family History:  Family History  Problem Relation Age of Onset  . Arthritis Mother   . Hypertension Mother     Social History:  reports that he has been smoking Cigarettes.  He started smoking about 41 years ago. He has a 40 pack-year smoking history. He has never used smokeless tobacco. He reports that he does not drink alcohol or use illicit drugs.  Additional Social History:  Alcohol / Drug Use Pain Medications: UTA Prescriptions: UTA Over the Counter: UTA Longest period of sobriety (when/how long): UTA  CIWA: CIWA-Ar BP: 163/86 mmHg Pulse Rate: 110 COWS:    PATIENT STRENGTHS: (choose at least two) Active sense of humor Communication skills  Allergies:  Allergies  Allergen Reactions  . Lithium Nausea Only, Nausea And Vomiting and Other (See Comments)    "get's very sick"  .  Trazodone And Nefazodone Nausea And Vomiting  . Trazodone Nausea And Vomiting    Home Medications:  (Not in a hospital admission)  OB/GYN Status:  No LMP for male patient.  General Assessment Data Location of Assessment: [facility] TTS Assessment: In system Is this a Tele or Face-to-Face Assessment?: Tele Assessment Is this an Initial Assessment or a Re-assessment for this encounter?: Initial Assessment Marital status: Other (comment) Maiden name: NA Is patient pregnant?: No Pregnancy Status: No Living Arrangements: Non-relatives/Friends Can pt return to current living arrangement?: Yes Admission Status: Involuntary Is patient capable of signing voluntary admission?: No Referral Source: Self/Family/Friend Insurance type: Medicare     Crisis Care Plan Living Arrangements: Non-relatives/Friends Name of Psychiatrist: NA Name of Therapist: NA  Education Status Is patient currently in school?: No Current Grade: NA Highest grade of school patient has completed: [employer] Name of school: NA Contact person: NA  Risk to self with the past 6 months Suicidal Ideation:  (UTA) Has patient been a risk to self within the past 6 months prior to admission? : Other (comment) Suicidal Intent:  (UTA) Has patient had any suicidal intent within the past 6 months prior to admission? :  (UTA) Is patient at risk for suicide?:  (UTA) Suicidal Plan?:  (UTA) Has patient had any suicidal plan within the past 6 months prior to admission? :  (UTA) Access to Means:  (UTA) What has been your use of drugs/alcohol within the last 12  months?: UTA Previous Attempts/Gestures:  (UTA) How many times?: 0 Other Self Harm Risks: NA Triggers for Past Attempts:  (UTA) Intentional Self Injurious Behavior: None Family Suicide History: Unable to assess Recent stressful life event(s):  (UTA) Persecutory voices/beliefs?:  Pincus Badder) Depression:  (UTA) Depression Symptoms:  (UTA) Substance abuse history and/or treatment for  substance abuse?:  (UTA) Suicide prevention information given to non-admitted patients:  (UTA)  Risk to Others within the past 6 months Homicidal Ideation: Yes-Currently Present (UTA) Does patient have any lifetime risk of violence toward others beyond the six months prior to admission? : Unknown Thoughts of Harm to Others: Yes-Currently Present Comment - Thoughts of Harm to Others: UTA Current Homicidal Intent:  (UTA) Current Homicidal Plan:  (UTA) Access to Homicidal Means:  (UTA) Identified Victim: UTA History of harm to others?:  (UTA) Assessment of Violence:  (UTA) Violent Behavior Description: UTA Does patient have access to weapons?:  (UTA) Criminal Charges Pending?:  (UTA) Does patient have a court date:  (UTA) Is patient on probation?: Unknown (UTa)  Psychosis Hallucinations:  (UTA) Delusions:  (UTA)  Mental Status Report Appearance/Hygiene: Disheveled Eye Contact: Poor Motor Activity: Freedom of movement Speech: Incoherent Level of Consciousness: Alert Mood: Angry Affect: Angry Anxiety Level: Severe Thought Processes: Tangential, Flight of Ideas Judgement: Impaired Orientation: Not oriented Obsessive Compulsive Thoughts/Behaviors: Unable to Assess  Cognitive Functioning Concentration: Unable to Assess Memory: Unable to Assess IQ: Average Insight: Unable to Assess Impulse Control: Poor Appetite:  (UTA) Weight Loss: 0 Weight Gain: 0 Sleep: Unable to Assess Total Hours of Sleep: 5 Vegetative Symptoms: Unable to Assess  ADLScreening [facility] Assessment Services) Patient's cognitive ability adequate to safely complete daily activities?: Yes Patient able to express need for assistance with ADLs?: Yes Independently performs ADLs?: Yes (appropriate for developmental age)  Prior Inpatient Therapy Prior Inpatient Therapy: Yes Prior Therapy Dates: 2013 Prior Therapy Facilty/Provider(s): [facility] Reason for Treatment: Schizophrenia  Prior Outpatient Therapy Prior  Outpatient Therapy:  (UTA) Prior Therapy Dates: NA Prior Therapy Facilty/Provider(s): NA Reason for Treatment: NA Does patient have an ACCT team?: Unknown Does patient have Intensive In-House Services?  : Unknown Does patient have [facility] services? : Unknown Does patient have P4CC services?: Unknown  ADL Screening (condition at time of admission) Patient's cognitive ability adequate to safely complete daily activities?: Yes Is the patient deaf or have difficulty hearing?: Yes Does the patient have difficulty seeing, even when wearing glasses/contacts?: No Does the patient have difficulty concentrating, remembering, or making decisions?: Yes Patient able to express need for assistance with ADLs?: Yes Does the patient have difficulty dressing or bathing?: No Independently performs ADLs?: Yes (appropriate for developmental age) Does the patient have difficulty walking or climbing stairs?: No       Abuse/Neglect Assessment (Assessment to be complete while patient is alone) Physical Abuse: Denies Verbal Abuse: Denies Sexual Abuse: Denies Exploitation of patient/patient's resources: Denies Self-Neglect: Denies     Regulatory affairs officer (For Healthcare) Does patient have an advance directive?: No Would patient like information on creating an advanced directive?: No - patient declined information    Additional Information 1:1 In Past 12 Months?: Yes CIRT Risk: Yes Elopement Risk: Yes Does patient have medical clearance?: Yes     Disposition:  Disposition Initial Assessment Completed for this Encounter: Yes Disposition of Patient: Inpatient treatment program Type of inpatient treatment program: Adult  Cyndia Bent 12/01/2015 5:49 PM

## 2015-12-01 NOTE — ED Provider Notes (Signed)
CSN: [id number]     Arrival date & time 12/01/15  1342 History   First MD Initiated Contact with Patient 12/01/15 1420     Chief Complaint  Patient presents with  . Altered Mental Status     (Consider location/radiation/quality/duration/timing/severity/associated sxs/prior Treatment) HPI Comments: Patient brought in by sheriff's deputy. He states he called 911 himself because he "needs a full exam [facility]". Patient states his name is "David [name]". He thinks he is at [facility]. He denies any suicidal or homicidal thoughts. It is unclear if he is hearing voices but seems to be responding to internal stimuli. States she's been taking his medications. Denies any alcohol or drug use. Patient uncooperative with tangential speech and paranoid thoughts.  Level V caveat for psychiatric illness.  The history is provided by the patient and the police. The history is limited by the condition of the patient.    Past Medical History  Diagnosis Date  . Schizophrenia ([place])   . Pneumonia   . Anxiety   . GERD (gastroesophageal reflux disease)   . COPD (chronic obstructive pulmonary disease) [facility])    Past Surgical History  Procedure Laterality Date  . Tonsillectomy     Family History  Problem Relation Age of Onset  . Arthritis Mother   . Hypertension Mother    Social History  Substance Use Topics  . Smoking status: Current Every Day Smoker -- 1.00 packs/day for 40 years    Types: Cigarettes    Start date: 04/14/1974  . Smokeless tobacco: Never Used  . Alcohol Use: No    Review of Systems  Unable to perform ROS: Psychiatric disorder      Allergies  Lithium; Trazodone and nefazodone; and Trazodone  Home Medications   Prior to Admission medications   Medication Sig Start Date End Date Taking? Authorizing Provider  acetaminophen (TYLENOL) 325 MG tablet Take 650 mg by mouth as needed.     Historical Provider, MD  albuterol (PROVENTIL HFA;VENTOLIN HFA) 108 (90  BASE) MCG/ACT inhaler Inhale 2 puffs into the lungs every 6 (six) hours as needed. 05/13/15   Claretta Fraise, MD  benztropine (COGENTIN) 0.5 MG tablet Take 0.5 mg by mouth.    Historical Provider, MD  ferrous sulfate 325 (65 FE) MG tablet Take 1 tablet (325 mg total) by mouth 2 (two) times daily with a meal. 08/15/15   Claretta Fraise, MD  gabapentin (NEURONTIN) 300 MG capsule Take 300 mg by mouth 2 (two) times daily.     Historical Provider, MD  haloperidol (HALDOL) 2 MG tablet Take 2 mg by mouth. Take 2tablets at bedtime    Historical Provider, MD  haloperidol lactate (HALDOL) 5 MG/ML injection 150 mg every 28 (twenty-eight) days.    Historical Provider, MD  losartan (COZAAR) 50 MG tablet Take 1 tablet (50 mg total) by mouth daily. 08/15/15   Claretta Fraise, MD  Multiple Vitamin (MULTIVITAMIN WITH MINERALS) TABS tablet Take 1 tablet by mouth every morning.    Historical Provider, MD  OLANZapine (ZYPREXA) 10 MG tablet Take 10 mg by mouth at bedtime.    Historical Provider, MD  omeprazole (PRILOSEC) 20 MG capsule Take 1 capsule (20 mg total) by mouth daily. 08/15/15   Claretta Fraise, MD  traZODone (DESYREL) 100 MG tablet Take 1 tablet by mouth at bedtime. 08/03/15   Historical Provider, MD   BP 167/87 mmHg  Pulse 94  Temp(Src) 98.2 F (36.8 C) (Oral)  Resp 19  Ht 6' (1.829 m)  [employer]  194 lb (87.998 kg)  BMI 26.31 kg/m2  SpO2 98% Physical Exam  Constitutional: He is oriented to person, place, and time. He appears well-developed and well-nourished. No distress.  Disheveled.  HENT:  Head: Normocephalic and atraumatic.  Mouth/Throat: Oropharynx is clear and moist. No oropharyngeal exudate.  Poor dentition.  Eyes: Conjunctivae and EOM are normal. Pupils are equal, round, and reactive to light.  Neck: Normal range of motion. Neck supple.  No meningismus.  Cardiovascular: Normal rate, regular rhythm, normal heart sounds and intact distal pulses.   No murmur heard. Pulmonary/Chest: Effort normal and breath  sounds normal. No respiratory distress.  Abdominal: Soft. There is no tenderness. There is no rebound and no guarding.  Musculoskeletal: Normal range of motion. He exhibits no edema or tenderness.  Neurological: He is alert and oriented to person, place, and time. No cranial nerve deficit. He exhibits normal muscle tone. Coordination normal.  No ataxia on finger to nose bilaterally. No pronator drift. 5/5 strength throughout. CN 2-12 intact.Equal grip strength. Sensation intact.   States name is "David [name]".  Knows he is at [facility].  Thinks it is April 2016.  Skin: Skin is warm.  Psychiatric: He has a normal mood and affect. His behavior is normal.  Nursing note and vitals reviewed.   ED Course  Procedures (including critical care time) Labs Review Labs Reviewed  CBC WITH DIFFERENTIAL/PLATELET - Abnormal; Notable for the following:    WBC 14.2 (*)    Platelets 146 (*)    Neutro Abs 9.8 (*)    Monocytes Absolute 1.2 (*)    Eosinophils Absolute 1.5 (*)    All other components within normal limits  COMPREHENSIVE METABOLIC PANEL - Abnormal; Notable for the following:    Creatinine, Ser 1.25 (*)    Calcium 8.5 (*)    Total Protein 6.1 (*)    Albumin 3.3 (*)    All other components within normal limits  ACETAMINOPHEN LEVEL - Abnormal; Notable for the following:    Acetaminophen (Tylenol), Serum <10 (*)    All other components within normal limits  ETHANOL  URINE RAPID DRUG SCREEN, HOSP PERFORMED  SALICYLATE LEVEL    Imaging Review Ct Head Wo Contrast  12/01/2015  CLINICAL DATA:  59 year old male with altered mental status EXAM: CT HEAD WITHOUT CONTRAST TECHNIQUE: Contiguous axial images were obtained from the base of the skull through the vertex without intravenous contrast. COMPARISON:  None. FINDINGS: The ventricles and sulci are appropriate in size for patient's age. Mild periventricular and deep white matter hypodensities represent chronic microvascular ischemic changes. Small old  right basal ganglia lacunar infarct. There is no intracranial hemorrhage. No mass effect or midline shift identified. The visualized paranasal sinuses and mastoid air cells are well aerated. The calvarium is intact. IMPRESSION: No acute intracranial pathology. Mild age-related atrophy and chronic microvascular ischemic disease. If symptoms persist and there are no contraindications, MRI may provide better evaluation if clinically indicated Electronically Signed   By: Anner Crete M.D.   On: 12/01/2015 23:01   I have personally reviewed and evaluated these images and lab results as part of my medical decision-making.   EKG Interpretation None      MDM   Final diagnoses:  None   level V caveat for psychiatric illness. Patient uncooperative. He seems to responding to internal stimuli. IVC paperwork is completed.  Labs show leukocytosis which is chronic appearing.  Labs otherwise appear to be at baseline. Patient was evaluated by TTS and meets criteria for  inpatient criteria for active psychosis. Home medications ordered and holding orders placed.       Ezequiel Essex, MD 12/01/15 [phone]

## 2015-12-01 NOTE — ED Notes (Signed)
Patient ate dinner. Patient calmer at this time.

## 2015-12-01 NOTE — ED Notes (Signed)
Pt become very aggressive and agitated.  Refusing to cooperate with staff instructions or be redirected.  Security and Event organiser at bedside.

## 2015-12-01 NOTE — ED Notes (Signed)
In to assess patient. Unable to tell me why he was brought here besides "[facility]." Then asked nurse about rugby and then on to baseball. States its April of 2016 wheen asked orientation questions. Pt calm and speaking with nurse until he was asked to change into paper scrubs per protocol. Patient then raised his voice and stated that he is not "putting any damn scrubs on". Flat affect. Uncooperative with phlebotomy as well, gave a different name/birthday with them.

## 2015-12-01 NOTE — ED Notes (Addendum)
Pt brought here by sheriff's deputy.  Pt talking out of his head in triage.  Pt denies SI or HI.

## 2015-12-01 NOTE — ED Notes (Signed)
TTS completed. 

## 2015-12-01 NOTE — ED Notes (Signed)
Pt return from CT.

## 2015-12-01 NOTE — ED Notes (Addendum)
Patient with multiple outbursts of yelling and agitiation. MD notified and order for 1 mg Ativan obtained. [employer] at bedside to serve IVC paperwork and patient took medication without difficulty.

## 2015-12-01 NOTE — ED Notes (Signed)
Patient stood suddenly in room, yelling about being hot and attempting to take scrub pants off. In to room and patient states he is hot. RN asked patient to leave pants on and will try to adjust the thermostat for room. Patient agreeable and returned to bed.

## 2015-12-01 NOTE — ED Notes (Signed)
Attempted again to have patient change and to draw blood. Patient at first states to be agreeable but then becomes agitated and refusing to cooperate. MD aware and IVC paperwork in progress.

## 2015-12-01 NOTE — Progress Notes (Signed)
Patient referred at the following facilities: [facility] - per Gerald Stabs, fax referral. [facility] per Olivia Mackie, fax referral. [facility] - per intake, fax referral for the waitlist. [facility] - per Karna Christmas, fax referral for review. [facility] - per intake, fax referral. [facility] - per intake, fax it.  At capacity: [facility] - per Fritz Pickerel, male geriatric only, call back Darlene at 8:30am.  CSW will continue to seek placement.  Verlon Setting, [place] Disposition staff 12/01/2015 6:13 PM

## 2015-12-01 NOTE — ED Notes (Signed)
Spoke with [employer]. They are seeking placement at [facility], but will also seek other placement as well.

## 2015-12-01 NOTE — ED Notes (Signed)
[employer] called to bedside. Patient IVC's, uncooperative with staff. Patient changed into paper scrubs after speaking with officer. 1 pair of brown boots, 1 pair of blue jeans, 1 pair of socks, 1 t-shirt, 1 black jacket, 1 belt, 1 wedding ring, 1 "class" ring, 1 watch. Belongings labeled and locked into locker. Patient allowed phlebotomy to collect blood work with [place] at bedside. Patient calmer, states he will cooperate with staff. [employer] left shortly after assisting RN with patient to change/obtain blood work.

## 2015-12-02 ENCOUNTER — Emergency Department (HOSPITAL_COMMUNITY): Payer: Medicare Other

## 2015-12-02 ENCOUNTER — Inpatient Hospital Stay (HOSPITAL_COMMUNITY): Admission: AD | Admit: 2015-12-02 | Payer: Medicare Other | Source: Intra-hospital | Admitting: Psychiatry

## 2015-12-02 DIAGNOSIS — R4182 Altered mental status, unspecified: Secondary | ICD-10-CM | POA: Diagnosis not present

## 2015-12-02 DIAGNOSIS — F200 Paranoid schizophrenia: Secondary | ICD-10-CM | POA: Diagnosis not present

## 2015-12-02 NOTE — Progress Notes (Signed)
Patient accepted at [facility], per [place], to 503-1, arrival time - after 7pm, call report at [phone]. RN Wells Guiles informed.  Verlon Setting, [place] Disposition staff 12/02/2015 6:08 PM

## 2015-12-02 NOTE — ED Notes (Signed)
Report given to Judson Roch, RN at [facility] for room 503-1.

## 2015-12-02 NOTE — ED Notes (Signed)
ACT team nurse: Susa Griffins [phone] ext. [phone]).

## 2015-12-02 NOTE — ED Notes (Signed)
PT talking and cussing to himself and looking at the wall at this time. PT agitated with this nurse at this time because I referred to him by [facility] because pt states his name is "David [name]". Another nurse assisted me at this time to safely removed pt from hallway back to his room. Sitter present at bedside at this time.

## 2015-12-02 NOTE — Progress Notes (Signed)
RN Opal Sidles at [facility] requested patient's UA. RN Samanatha informed and provided with [facility] fax#: [phone].  Verlon Setting, [place] Disposition staff 12/02/2015 9:55 PM

## 2015-12-02 NOTE — ED Notes (Signed)
Fabrica called at this time and stated be had been accepted at [facility] 503-1 and pt could come after 1900 today.

## 2015-12-02 NOTE — ED Notes (Signed)
Spoke with Catia , SW, stated she has reached out to [facility] regarding placement; weight requested from them, patient weighed and Catia informed.

## 2015-12-02 NOTE — Progress Notes (Addendum)
RN Opal Sidles from [facility] requested patient's vitals, labs, IVC, and weight. Weight and labs obtained and faxed to [facility]. Updated vitals and medication list (from 12/02) had been faxed to [facility].  Verlon Setting, [place] Disposition staff 12/02/2015 8:00 PM

## 2015-12-02 NOTE — Progress Notes (Signed)
Seeking inpatient placement for pt to receive psychiatric treatment. Also considered for admission to [facility] upon bed availability on appropriate unit.  Referred to: [facility]- per Reinaldo Berber- per Shirlee Limerick (will send EKG and chest x-ray results to accompany referral when available) [facility]- left voicemail inquiring as to status of referral faxed 12/01/15 [place]- made referral to [facility] with authorization # [id number] obtained from [facility]. Completed verbal referral with Pamala Hurry, [facility] intake RN, and faxed regional referral form to [facility] for review.   On waiting list: [facility]- per Angie  Declined: [facility]- per Cletus Gash, due to aggression [facility]- per Gerald Stabs due to aggression and overall acuity of illness [facility]- per Ivin Booty due to aggression  At capacity for high acuity units: [facility]- per [facility]- per Presley Raddle- per Sumner Boast, MSW, LCSW Clinical Social Work, Disposition  12/02/2015 [phone]

## 2015-12-02 NOTE — ED Notes (Signed)
Spoke with [place], SW per her [place] is requesting a UA be done and the results sent to them at [phone]

## 2015-12-02 NOTE — ED Notes (Signed)
Spoke with [facility] at [facility] she stated that after reviewing the patients case they are unable to accept patient at this time. She will speak with the person in charge of seeking placement for patient.

## 2015-12-02 NOTE — ED Notes (Signed)
Patient carrying on a two part conversation with himself, one side as himself and another side as a friend.

## 2015-12-02 NOTE — Progress Notes (Addendum)
Patient under review at [facility].  IVC papers to be faxed to [facility] at fax#: [phone].  RN informed.  Verlon Setting, [place] Disposition staff 12/02/2015 3:29 PM

## 2015-12-02 NOTE — ED Notes (Signed)
Vital signs and EMAR for the last 24 hours faxed to SW

## 2015-12-03 ENCOUNTER — Encounter (HOSPITAL_COMMUNITY): Payer: Self-pay | Admitting: *Deleted

## 2015-12-03 ENCOUNTER — Inpatient Hospital Stay (HOSPITAL_COMMUNITY)
Admission: AD | Admit: 2015-12-03 | Discharge: 2015-12-30 | DRG: 885 | Disposition: A | Discharge status: Home / Self Care | Payer: Medicare Other | Source: Intra-hospital | Attending: Psychiatry | Admitting: Psychiatry

## 2015-12-03 DIAGNOSIS — D693 Immune thrombocytopenic purpura: Secondary | ICD-10-CM | POA: Diagnosis present

## 2015-12-03 DIAGNOSIS — Z23 Encounter for immunization: Secondary | ICD-10-CM | POA: Diagnosis not present

## 2015-12-03 DIAGNOSIS — Z8249 Family history of ischemic heart disease and other diseases of the circulatory system: Secondary | ICD-10-CM | POA: Diagnosis not present

## 2015-12-03 DIAGNOSIS — F200 Paranoid schizophrenia: Principal | ICD-10-CM | POA: Diagnosis present

## 2015-12-03 DIAGNOSIS — G4700 Insomnia, unspecified: Secondary | ICD-10-CM | POA: Diagnosis present

## 2015-12-03 DIAGNOSIS — Z79899 Other long term (current) drug therapy: Secondary | ICD-10-CM | POA: Diagnosis not present

## 2015-12-03 DIAGNOSIS — Z9114 Patient's other noncompliance with medication regimen: Secondary | ICD-10-CM

## 2015-12-03 DIAGNOSIS — Z862 Personal history of diseases of the blood and blood-forming organs and certain disorders involving the immune mechanism: Secondary | ICD-10-CM

## 2015-12-03 DIAGNOSIS — N182 Chronic kidney disease, stage 2 (mild): Secondary | ICD-10-CM | POA: Diagnosis present

## 2015-12-03 DIAGNOSIS — F17210 Nicotine dependence, cigarettes, uncomplicated: Secondary | ICD-10-CM | POA: Diagnosis present

## 2015-12-03 DIAGNOSIS — F419 Anxiety disorder, unspecified: Secondary | ICD-10-CM | POA: Diagnosis not present

## 2015-12-03 DIAGNOSIS — J449 Chronic obstructive pulmonary disease, unspecified: Secondary | ICD-10-CM | POA: Diagnosis present

## 2015-12-03 DIAGNOSIS — I129 Hypertensive chronic kidney disease with stage 1 through stage 4 chronic kidney disease, or unspecified chronic kidney disease: Secondary | ICD-10-CM | POA: Diagnosis present

## 2015-12-03 DIAGNOSIS — E221 Hyperprolactinemia: Secondary | ICD-10-CM | POA: Diagnosis present

## 2015-12-03 DIAGNOSIS — J45909 Unspecified asthma, uncomplicated: Secondary | ICD-10-CM | POA: Diagnosis present

## 2015-12-03 DIAGNOSIS — K219 Gastro-esophageal reflux disease without esophagitis: Secondary | ICD-10-CM | POA: Diagnosis present

## 2015-12-03 LAB — URINALYSIS, ROUTINE W REFLEX MICROSCOPIC
Bilirubin Urine: NEGATIVE
Glucose, UA: NEGATIVE mg/dL
Hgb urine dipstick: NEGATIVE
Ketones, ur: NEGATIVE mg/dL
Leukocytes, UA: NEGATIVE
Nitrite: NEGATIVE
Protein, ur: NEGATIVE mg/dL
Specific Gravity, Urine: <1.005 — ABNORMAL LOW (ref 1.005–1.030)
pH: 6 (ref 5.0–8.0)

## 2015-12-03 MED ORDER — ALBUTEROL SULFATE HFA 108 (90 BASE) MCG/ACT IN AERS
2.0000 | INHALATION_SPRAY | Freq: Four times a day (QID) | RESPIRATORY_TRACT | Status: DC | PRN
  Administered 2015-12-03 – 2015-12-23 (×13): 2 via RESPIRATORY_TRACT
  Filled 2015-12-03: qty 6.7

## 2015-12-03 MED ORDER — NICOTINE POLACRILEX 2 MG MT GUM
2.0000 mg | CHEWING_GUM | OROMUCOSAL | Status: DC | PRN
  Administered 2015-12-04 – 2015-12-12 (×11): 2 mg via ORAL
  Filled 2015-12-03 (×8): qty 1

## 2015-12-03 MED ORDER — MAGNESIUM HYDROXIDE 400 MG/5ML PO SUSP
30.0000 mL | Freq: Every day | ORAL | Status: DC | PRN
  Administered 2015-12-05: 30 mL via ORAL
  Filled 2015-12-03: qty 30

## 2015-12-03 MED ORDER — LORAZEPAM 1 MG PO TABS
1.0000 mg | ORAL_TABLET | ORAL | Status: DC | PRN
  Filled 2015-12-03: qty 1

## 2015-12-03 MED ORDER — PANTOPRAZOLE SODIUM 40 MG PO TBEC
40.0000 mg | DELAYED_RELEASE_TABLET | Freq: Every day | ORAL | Status: DC
  Administered 2015-12-04 – 2015-12-30 (×27): 40 mg via ORAL
  Filled 2015-12-03 (×30): qty 1

## 2015-12-03 MED ORDER — MIRTAZAPINE 15 MG PO TABS
15.0000 mg | ORAL_TABLET | Freq: Every evening | ORAL | Status: DC | PRN
  Administered 2015-12-03 – 2015-12-29 (×24): 15 mg via ORAL
  Filled 2015-12-03 (×24): qty 1

## 2015-12-03 MED ORDER — OLANZAPINE 10 MG PO TABS
10.0000 mg | ORAL_TABLET | Freq: Three times a day (TID) | ORAL | Status: DC
  Administered 2015-12-03 – 2015-12-05 (×5): 10 mg via ORAL
  Filled 2015-12-03 (×9): qty 1

## 2015-12-03 MED ORDER — GABAPENTIN 300 MG PO CAPS
300.0000 mg | ORAL_CAPSULE | Freq: Two times a day (BID) | ORAL | Status: DC
  Administered 2015-12-03 – 2015-12-28 (×51): 300 mg via ORAL
  Filled 2015-12-03 (×56): qty 1

## 2015-12-03 MED ORDER — BENZTROPINE MESYLATE 1 MG PO TABS
1.0000 mg | ORAL_TABLET | Freq: Four times a day (QID) | ORAL | Status: DC | PRN
  Administered 2015-12-04 (×3): 1 mg via ORAL
  Filled 2015-12-03 (×3): qty 1

## 2015-12-03 MED ORDER — BENZTROPINE MESYLATE 0.5 MG PO TABS
0.5000 mg | ORAL_TABLET | Freq: Every day | ORAL | Status: DC
  Administered 2015-12-03 – 2015-12-29 (×27): 0.5 mg via ORAL
  Filled 2015-12-03 (×32): qty 1

## 2015-12-03 MED ORDER — ALUM & MAG HYDROXIDE-SIMETH 200-200-20 MG/5ML PO SUSP
30.0000 mL | ORAL | Status: DC | PRN
  Administered 2015-12-04 – 2015-12-22 (×4): 30 mL via ORAL
  Filled 2015-12-03 (×4): qty 30

## 2015-12-03 MED ORDER — PNEUMOCOCCAL VAC POLYVALENT 25 MCG/0.5ML IJ INJ
0.5000 mL | INJECTION | INTRAMUSCULAR | Status: AC
  Administered 2015-12-04: 0.5 mL via INTRAMUSCULAR

## 2015-12-03 MED ORDER — ZIPRASIDONE MESYLATE 20 MG IM SOLR: 20.0000 mg | Freq: Two times a day (BID) | INTRAMUSCULAR | Status: DC | PRN

## 2015-12-03 MED ORDER — ACETAMINOPHEN 325 MG PO TABS
650.0000 mg | ORAL_TABLET | Freq: Four times a day (QID) | ORAL | Status: DC | PRN
  Administered 2015-12-04 – 2015-12-29 (×32): 650 mg via ORAL
  Filled 2015-12-03 (×33): qty 2

## 2015-12-03 MED ORDER — LOSARTAN POTASSIUM 50 MG PO TABS
50.0000 mg | ORAL_TABLET | Freq: Every day | ORAL | Status: DC
  Administered 2015-12-04 – 2015-12-28 (×25): 50 mg via ORAL
  Filled 2015-12-03 (×28): qty 1

## 2015-12-03 MED ORDER — HALOPERIDOL 5 MG PO TABS
5.0000 mg | ORAL_TABLET | Freq: Four times a day (QID) | ORAL | Status: DC | PRN
  Administered 2015-12-03 – 2015-12-04 (×3): 5 mg via ORAL
  Filled 2015-12-03 (×3): qty 1

## 2015-12-03 NOTE — ED Notes (Signed)
Pt mother Melene Plan, [place], notified of pt transfer to [facility]

## 2015-12-03 NOTE — ED Notes (Signed)
Per Frankey Poot at [facility] accepted to 503-1, Dr. Shea Evans. Call report to [phone].

## 2015-12-03 NOTE — Progress Notes (Signed)
59 year old male admitted on involuntary basis. On admission, pt presents as paranoid, delusional and appears to be responding to internal stimuli. Pt was cooperative with admission process. Pt reports that he thinks that he is here for a party. Pt also reports that he has been taking his medications as prescribed and reports that he goes to the doctors and does what he is suppose to do. Pt denies any alcohol or drug usage. Pt also denies any SI and able to contract for safety on the unit. Pt reports that he remembers being in this facility and does appear alert and oriented. Pt was oriented to the unit and safety maintained.

## 2015-12-03 NOTE — Progress Notes (Signed)
D: David [name] is very loud, confused, easily agitated and exhibiting bizarre behavior. 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 He initially said he was here for a cookout and then he said a party. He stated he was in [facility]. Is able to be redirected after several attempts. He goes from pleasant to loud and angry within a few minutes. He talks about his friends and giving them Quaaludes. States if anyone lies to him while he is here he is going to [employer] their heads off. 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 Then he changes topic and states "I'm only looking forward. I'm not looking in the past".

## 2015-12-03 NOTE — Tx Team (Signed)
Initial Interdisciplinary Treatment Plan   PATIENT STRESSORS: psychosis   PATIENT STRENGTHS: Ability for insight Active sense of humor Average or above average intelligence Capable of independent living General fund of knowledge   PROBLEM LIST: Problem List/Patient Goals Date to be addressed Date deferred Reason deferred Estimated date of resolution  Delusions 12/03/15     Paranoia 12/03/15     "Blacks and whites finally getting along" 12/03/15                                          DISCHARGE CRITERIA:  Ability to meet basic life and health needs Improved stabilization in mood, thinking, and/or behavior Verbal commitment to aftercare and medication compliance  PRELIMINARY DISCHARGE PLAN: Attend aftercare/continuing care group Return to previous living arrangement  PATIENT/FAMIILY INVOLVEMENT: This treatment plan has been presented to and reviewed with the patient, David [name], and/or family member, .  The patient and family have been given the opportunity to ask questions and make suggestions.  [place], [address] 12/03/2015, 2:27 PM

## 2015-12-03 NOTE — ED Notes (Signed)
Pt alert and cooperative at present. Responding to internal stimuli (talking to self), but easily redirected to tasks.

## 2015-12-04 ENCOUNTER — Encounter (HOSPITAL_COMMUNITY): Payer: Self-pay | Admitting: Psychiatry

## 2015-12-04 ENCOUNTER — Other Ambulatory Visit: Payer: Self-pay

## 2015-12-04 DIAGNOSIS — F200 Paranoid schizophrenia: Principal | ICD-10-CM

## 2015-12-04 MED ORDER — LIDOCAINE 5 % EX PTCH
1.0000 | MEDICATED_PATCH | CUTANEOUS | Status: DC
  Administered 2015-12-04 – 2015-12-29 (×25): 1 via TRANSDERMAL
  Filled 2015-12-04 (×29): qty 1

## 2015-12-04 MED ORDER — LORAZEPAM 1 MG PO TABS
1.0000 mg | ORAL_TABLET | Freq: Two times a day (BID) | ORAL | Status: DC
  Administered 2015-12-04 – 2015-12-05 (×2): 1 mg via ORAL
  Filled 2015-12-04 (×2): qty 1

## 2015-12-04 MED ORDER — GI COCKTAIL ~~LOC~~
30.0000 mL | Freq: Once | ORAL | Status: AC
  Administered 2015-12-04: 30 mL via ORAL
  Filled 2015-12-04: qty 30

## 2015-12-04 MED ORDER — CLONIDINE HCL 0.1 MG PO TABS
0.1000 mg | ORAL_TABLET | Freq: Once | ORAL | Status: AC
  Administered 2015-12-04: 0.1 mg via ORAL
  Filled 2015-12-04 (×2): qty 1

## 2015-12-04 NOTE — BHH Suicide Risk Assessment (Signed)
[facility] Admission Suicide Risk Assessment   Nursing information obtained from:    Demographic factors:    Current Mental Status:    Loss Factors:    Historical Factors:    Risk Reduction Factors:    Total Time spent with patient: 1 hour Principal Problem: Paranoid schizophrenia ([place]) Diagnosis:   Patient Active Problem List   Diagnosis Date Noted  . Paranoid schizophrenia ([place]) [F20.0] 12/03/2015  . HTN (hypertension) [I10] 03/04/2012  . GERD (gastroesophageal reflux disease) [K21.9] 03/04/2012  . Insomnia [G47.00] 03/04/2012  . CKD (chronic kidney disease) stage 2, GFR 60-89 ml/min [N18.2] 03/04/2012  . Schizophrenia, paranoid, chronic ([place]) [F20.0] 02/18/2012     Continued Clinical Symptoms:  Alcohol Use Disorder Identification Test Final Score (AUDIT): 1 The "Alcohol Use Disorders Identification Test", Guidelines for Use in Primary Care, Second Edition.  World Pharmacologist [facility]). Score between 0-7:  no or low risk or alcohol related problems. Score between 8-15:  moderate risk of alcohol related problems. Score between 16-19:  high risk of alcohol related problems. Score 20 or above:  warrants further diagnostic evaluation for alcohol dependence and treatment.   CLINICAL FACTORS:   Schizophrenia:   Paranoid or undifferentiated type Currently Psychotic    Psychiatric Specialty Exam: see H&P for MSE Physical Exam  ROS  Blood pressure 167/88, pulse 112, temperature 98.4 F (36.9 C), temperature source Oral, resp. rate 18, height 5\' 10"  (1.778 m), weight 86.183 kg (190 lb).Body mass index is 27.26 kg/(m^2).       COGNITIVE FEATURES THAT CONTRIBUTE TO RISK:  Closed-mindedness, Loss of executive function, Polarized thinking and Thought constriction (tunnel vision)    SUICIDE RISK:   Moderate:  Frequent suicidal ideation with limited intensity, and duration, some specificity in terms of plans, no associated intent, good self-control, limited dysphoria/symptomatology,  some risk factors present, and identifiable protective factors, including available and accessible social support.  PLAN OF CARE: see H&P   Medical Decision Making:  Review of Psycho-Social Stressors (1), Review or order clinical lab tests (1), Review and summation of old records (2), Established Problem, Worsening (2), Review of Medication Regimen & Side Effects (2) and Review of New Medication or Change in Dosage (2)  I certify that inpatient services furnished can reasonably be expected to improve the patient's condition.   Sparkles Mcneely, [place] 12/04/2015, 9:07 AM

## 2015-12-04 NOTE — H&P (Signed)
Psychiatric Admission Assessment Adult  Patient Identification: David [name] MRN:  [id number] Date of Evaluation:  12/04/2015 Chief Complaint:  Schizophrenia Principal Diagnosis: Paranoid schizophrenia ([place]) Diagnosis:   Patient Active Problem List   Diagnosis Date Noted  . Paranoid schizophrenia ([place]) [F20.0] 12/03/2015  . HTN (hypertension) [I10] 03/04/2012  . GERD (gastroesophageal reflux disease) [K21.9] 03/04/2012  . Insomnia [G47.00] 03/04/2012  . CKD (chronic kidney disease) stage 2, GFR 60-89 ml/min [N18.2] 03/04/2012  . Schizophrenia, paranoid, chronic ([place]) [F20.0] 02/18/2012   History of Present Illness:: Pt is psychotic and labile. He is a poor historian and unable to verify symptoms, meds, allergies, medical problems or substance abuse.   States it is a Mining engineer of emergency in all the tiny worlds but don't worry we are safe".  Denies depression. States he had SI several months ago and today denies it. Denies HI. States he has AVH all the time. When asked for details he states "it depends on who I am talking to". He asked for 10mg  Valium. He stated his back was hurting and asked writer several times to rub it.   Pt appears to be delusional, grandiose, hypersexual, labile and disorganized.  Associated Signs/Symptoms: Depression Symptoms:  insomnia, (Hypo) Manic Symptoms:  Distractibility, Flight of Ideas, Grandiosity, Impulsivity, Irritable Mood, Labiality of Mood, Sexually Inapproprite Behavior, Anxiety Symptoms:  negative Psychotic Symptoms:  Delusions, Hallucinations: Auditory Visual Ideas of Reference, Paranoia, PTSD Symptoms: NA Total Time spent with patient: 45 minutes  Past Psychiatric History: reports he has been hospitalized multiple times.   Risk to Self: Is patient at risk for suicide?: No Risk to Others:   Prior Inpatient Therapy:   Prior Outpatient Therapy:    Alcohol Screening: 1. How often do you have a drink containing alcohol?: Monthly or  less 2. How many drinks containing alcohol do you have on a typical day when you are drinking?: 1 or 2 3. How often do you have six or more drinks on one occasion?: Never Preliminary Score: 0 9. Have you or someone else been injured as a result of your drinking?: No 10. Has a relative or friend or a doctor or another health worker been concerned about your drinking or suggested you cut down?: No Alcohol Use Disorder Identification Test Final Score (AUDIT): 1 Brief Intervention: AUDIT score less than 7 or less-screening does not suggest unhealthy drinking-brief intervention not indicated Substance Abuse History in the last 12 months:  Unable to assess Consequences of Substance Abuse: unable to assess Previous Psychotropic Medications: Yes  Psychological Evaluations: Yes  Past Medical History:  Past Medical History  Diagnosis Date  . Schizophrenia ([place])   . Pneumonia   . Anxiety   . GERD (gastroesophageal reflux disease)   . COPD (chronic obstructive pulmonary disease) [facility])     Past Surgical History  Procedure Laterality Date  . Tonsillectomy     Family History:  Family History  Problem Relation Age of Onset  . Arthritis Mother   . Hypertension Mother    Family Psychiatric  History: unable to assess Social History:  History  Alcohol Use No     History  Drug Use No    Comment: denies    Social History   Social History  . Marital Status: Legally Separated    Spouse Name: N/A  . Number of Children: N/A  . Years of Education: N/A   Social History Main Topics  . Smoking status: Current Every Day Smoker -- 1.00 packs/day for 40 years  Types: Cigarettes    Start date: 04/14/1974  . Smokeless tobacco: Never Used  . Alcohol Use: No  . Drug Use: No     Comment: denies  . Sexual Activity: No   Other Topics Concern  . None   Social History Narrative   Additional Social History: states he lives alone in [place], [place]                         Allergies:   Unable to verify allergies Allergies  Allergen Reactions  . Lithium Nausea Only, Nausea And Vomiting and Other (See Comments)    "get's very sick"  . Trazodone And Nefazodone Nausea And Vomiting  . Trazodone Nausea And Vomiting   Lab Results: No results found for this or any previous visit (from the past 48 hour(s)).  Metabolic Disorder Labs:  Lab Results  Component Value Date   HGBA1C 5.2 02/23/2012   MPG 103 02/23/2012   No results found for: PROLACTIN Lab Results  Component Value Date   CHOL 163 02/23/2012   TRIG 132 02/23/2012   HDL 29* 02/23/2012   CHOLHDL 5.6 02/23/2012   VLDL 26 02/23/2012   LDLCALC 108* 02/23/2012    Current Medications: Current Facility-Administered Medications  Medication Dose Route Frequency Provider Last Rate Last Dose  . acetaminophen (TYLENOL) tablet 650 mg  650 mg Oral Q6H PRN Derrill Center, NP   650 mg at 12/04/15 0645  . albuterol (PROVENTIL HFA;VENTOLIN HFA) 108 (90 BASE) MCG/ACT inhaler 2 puff  2 puff Inhalation Q6H PRN Derrill Center, NP   2 puff at 12/04/15 0227  . alum & mag hydroxide-simeth (MAALOX/MYLANTA) 200-200-20 MG/5ML suspension 30 mL  30 mL Oral Q4H PRN Derrill Center, NP   30 mL at 12/04/15 0225  . benztropine (COGENTIN) tablet 0.5 mg  0.5 mg Oral QHS Derrill Center, NP   0.5 mg at 12/03/15 2157  . haloperidol (HALDOL) tablet 5 mg  5 mg Oral Q6H PRN Derrill Center, NP   5 mg at 12/04/15 0905   And  . benztropine (COGENTIN) tablet 1 mg  1 mg Oral Q6H PRN Derrill Center, NP   1 mg at 12/04/15 0905  . gabapentin (NEURONTIN) capsule 300 mg  300 mg Oral BID Derrill Center, NP   300 mg at 12/04/15 0816  . lidocaine (LIDODERM) 5 % 1 patch  1 patch Transdermal Q24H [name], MD      . ziprasidone (GEODON) injection 20 mg  20 mg Intramuscular Q12H PRN Derrill Center, NP       And  . LORazepam (ATIVAN) tablet 1 mg  1 mg Oral PRN Derrill Center, NP      . LORazepam (ATIVAN) tablet 1 mg  1 mg Oral BID [name], MD      .  losartan (COZAAR) tablet 50 mg  50 mg Oral Daily Derrill Center, NP   50 mg at 12/04/15 0816  . magnesium hydroxide (MILK OF MAGNESIA) suspension 30 mL  30 mL Oral Daily PRN Derrill Center, NP      . mirtazapine (REMERON) tablet 15 mg  15 mg Oral QHS PRN Derrill Center, NP   15 mg at 12/03/15 2157  . nicotine polacrilex (NICORETTE) gum 2 mg  2 mg Oral PRN [name], MD   2 mg at 12/04/15 0821  . OLANZapine (ZYPREXA) tablet 10 mg  10 mg Oral TID Derrill Center,  NP   10 mg at 12/04/15 0816  . pantoprazole (PROTONIX) EC tablet 40 mg  40 mg Oral Daily Derrill Center, NP   40 mg at 12/04/15 [id number]   PTA Medications: Prescriptions prior to admission  Medication Sig Dispense Refill Last Dose  . albuterol (PROVENTIL HFA;VENTOLIN HFA) 108 (90 BASE) MCG/ACT inhaler Inhale 2 puffs into the lungs every 6 (six) hours as needed. 1 Inhaler 11 unknown  . benztropine (COGENTIN) 0.5 MG tablet Take 0.5 mg by mouth at bedtime.    unknown  . ferrous sulfate 325 (65 FE) MG tablet Take 1 tablet (325 mg total) by mouth 2 (two) times daily with a meal. 60 tablet 5 unknown  . gabapentin (NEURONTIN) 300 MG capsule Take 300 mg by mouth 2 (two) times daily.    unknown  . haloperidol (HALDOL) 2 MG tablet Take 2 mg by mouth at bedtime.    unknown  . haloperidol lactate (HALDOL) 5 MG/ML injection Inject 150 mg into the muscle every 30 (thirty) days.    unknown  . losartan (COZAAR) 50 MG tablet Take 1 tablet (50 mg total) by mouth daily. 30 tablet 5 unknown  . mirtazapine (REMERON) 15 MG tablet Take 15 mg by mouth at bedtime as needed (sleep).   unknown  . OLANZapine (ZYPREXA) 10 MG tablet Take 10 mg by mouth 3 (three) times daily.    unknown  . omeprazole (PRILOSEC) 20 MG capsule Take 1 capsule (20 mg total) by mouth daily. 30 capsule 11 unknown    Musculoskeletal: Strength & Muscle Tone: within normal limits Gait & Station: normal Patient leans: N/A  Psychiatric Specialty Exam: Physical Exam I concur with PE done in  ED  Review of Systems  Unable to perform ROS   Blood pressure 167/88, pulse 112, temperature 98.4 F (36.9 C), temperature source Oral, resp. rate 18, height 5\' 10"  (1.778 m), weight 86.183 kg (190 lb).Body mass index is 27.26 kg/(m^2).  General Appearance: Disheveled  Eye Sport and exercise psychologist::  Fair  Speech:  Garbled  Volume:  variable  Mood:  Irritable  Affect:  Inappropriate and Labile  Thought Process:  Disorganized, Irrelevant and Loose  Orientation:  Negative  Thought Content:  Delusions, Hallucinations: Auditory Visual and Paranoid Ideation  Suicidal Thoughts:  No  Homicidal Thoughts:  No  Memory:  Immediate;   Poor Recent;   Poor Remote;   Poor  Judgement:  Impaired  Insight:  Lacking  Psychomotor Activity:  Increased  Concentration:  Poor  Recall:  Poor  Fund of Knowledge:Poor  Language: Poor  Akathisia:  No  Handed:  Right  AIMS (if indicated):     Assets:  Financial Resources/Insurance  ADL's:  Impaired  Cognition: Impaired,  Mild  Sleep:  Number of Hours: 5.75     Treatment Plan Summary: Daily contact with patient to assess and evaluate symptoms and progress in treatment and Medication management  Agitation protocol Remeron 15mg  po qHS prn insomnia Zyprexa 10mg  po TID Start Ativan 1mg  BID  Cogentin 0.5mg  po qHS Neurontin 300mg  BID  Lidocaine patch for pain Losartan 50mg  qD for HTN  Labs: creat 1.25, WBC 14.2, Platelets 146, UDS neg Head CT: No acute intracranial pathology. Mild age-related atrophy and chronic microvascular ischemic disease.  EKG QTc 423, normal sinus rhythm    Observation Level/Precautions:  15 minute checks  Laboratory:  Labs resulted, reviewed.  Psychotherapy:  Group therapy, individual therapy, psychoeducation  Medications:  See MAR above  Consultations: consider Hospitalist consult    Discharge  Concerns: None    Estimated LOS: 5-7 days  Other:  N/A     I certify that inpatient services furnished can reasonably be expected to  improve the patient's condition.   Aylee Littrell 12/4/201610:59 AM

## 2015-12-04 NOTE — Progress Notes (Signed)
Pt was invited but stayed in his room for group.

## 2015-12-04 NOTE — BHH Group Notes (Signed)
[place] Group Notes:  (Clinical Social Work)  12/04/2015  [place] Group Notes:  (Clinical Social Work)  12/04/2015  11:00AM-12:00PM  Summary of Progress/Problems:  The main focus of today's process group was to listen to a variety of genres of music and to identify that different types of music provoke different responses.  The patient then was able to identify personally what was soothing for them, as well as energizing.  Handouts were used to record feelings evoked, as well as how patient can personally use this knowledge in sleep habits, with depression, and with other symptoms.  The patient talked throughout group almost continuously, looking directly at [place] as though he could be heard over the music.  He was labile, alternating between tears, anger, and smiles.    Type of Therapy:  Music Therapy   Participation Level:  Active  Participation Quality:  Attentive and Distracting  Affect:  Flat and Labile and Tearful  Cognitive:  Disorganized, Hallucinating  Insight:  Poor  Engagement in Therapy:  Limited  Modes of Intervention:   Activity, Exploration  Selmer Dominion, LCSW 12/04/2015

## 2015-12-04 NOTE — BHH Group Notes (Signed)
[place] Group Notes:  (Nursing/MHT/Case Management/Adjunct)  Date:  12/04/2015  Time: 0930  Type of Therapy:  Nurse Education  Participation Level:  Active  Participation Quality:  Intrusive and Inattentive  Affect:  Anxious and Not Congruent  Cognitive:  Disorganized, Confused and Delusional  Insight:  Limited  Engagement in Group:  Distracting  Modes of Intervention:  Activity, Clarification, Discussion and Education  Summary of Progress/Problems:  Forrestine Him 12/04/2015, 12:48 PM

## 2015-12-04 NOTE — Progress Notes (Signed)
Pt up at nurses station c/o chest pain requesting tylenol. Pt pointing to center of chest. Pt denies pain radiation. Pt unable to describe pain. "This is bone pain." Pt psychotic. VS obtained, FNP notified. EKG obtained per MD order. Will continue to monitor.

## 2015-12-04 NOTE — BHH Counselor (Signed)
CSW could not do Psychosocial Assessment with pt due to his labile mood and bizarre behavior/speech.  To be tried again tomorrow.

## 2015-12-04 NOTE — Progress Notes (Signed)
D:Per patient self inventory form pt reports he slept good with the use of sleep medication. He reports a good appetite, hyper energy, good concentration. He rates depression -2/10, hopelessness -1/10, anxiety 9/10- all on 0-10 scale, 10 being the worse. Pt disorganized. Loose associations. Tangential. Denies SI/HI. Appears to be responding to internal stimuli at this time. Tearful at times. Speech pressured, psychotic. Pt disruptive during nursing group, intrusive.  A:Special checks q 15 mins in place for safety. Medication administered per MD order(see eMAR) Encouragement and support provided.  R:Safety maintained. Compliant with medication regimen. Will continue to monitor.

## 2015-12-04 NOTE — Progress Notes (Signed)
D: Patient in the hallway on approach.  Patient disheveled and is hard to understand.  Patient also has tangential speech.  Patient states he had an ok day he has been sleeping.  Patient did not engage in conversation with Probation officer.  Patient denies SI/HI and denies AVH. A: Staff to monitor Q 15 mins for safety.  Encouragement and support offered.  Scheduled medications administered per orders.  Tylenol administered prn tonight. R: Patient remains safe on the unit.  Patient did not attend group tonight.  Patient only visible on the unit for medications tonight.  Patient taking administered medications.

## 2015-12-05 DIAGNOSIS — Z862 Personal history of diseases of the blood and blood-forming organs and certain disorders involving the immune mechanism: Secondary | ICD-10-CM

## 2015-12-05 MED ORDER — OLANZAPINE 10 MG PO TABS
10.0000 mg | ORAL_TABLET | Freq: Three times a day (TID) | ORAL | Status: DC | PRN
  Administered 2015-12-05 – 2015-12-23 (×13): 10 mg via ORAL
  Filled 2015-12-05 (×13): qty 1

## 2015-12-05 MED ORDER — OLANZAPINE 10 MG IM SOLR: 10.0000 mg | Freq: Three times a day (TID) | INTRAMUSCULAR | Status: DC | PRN

## 2015-12-05 MED ORDER — OLANZAPINE 5 MG PO TABS
5.0000 mg | ORAL_TABLET | Freq: Three times a day (TID) | ORAL | Status: DC
  Administered 2015-12-05: 5 mg via ORAL
  Filled 2015-12-05 (×3): qty 1

## 2015-12-05 MED ORDER — LORAZEPAM 1 MG PO TABS
1.0000 mg | ORAL_TABLET | Freq: Two times a day (BID) | ORAL | Status: DC | PRN
  Administered 2015-12-05 – 2015-12-22 (×25): 1 mg via ORAL
  Filled 2015-12-05 (×26): qty 1

## 2015-12-05 MED ORDER — PALIPERIDONE ER 3 MG PO TB24
3.0000 mg | ORAL_TABLET | Freq: Every day | ORAL | Status: DC
  Administered 2015-12-05: 3 mg via ORAL
  Filled 2015-12-05 (×2): qty 1

## 2015-12-05 MED ORDER — OLANZAPINE 5 MG PO TABS
5.0000 mg | ORAL_TABLET | Freq: Every day | ORAL | Status: DC
  Administered 2015-12-06: 5 mg via ORAL
  Filled 2015-12-05 (×2): qty 1

## 2015-12-05 NOTE — Progress Notes (Signed)
This AM patient was standing in the hallway talking to a peer and patient pulled $12.00 out of his pocket.  Patient offered to give his peer 5 dollars if peer brought him back 10 dollars.  Writer explained that this could not happen and patient insisted but kept his money in his pocket.  Writer did get patient to give 6 dollars to place back in his locker and patient kept 6 dollars on him.  Writer saw patient whispering to other patient unsure of what they were talking about but both patient and his peers were told that giving put money was not allowed.

## 2015-12-05 NOTE — Progress Notes (Signed)
[facility] MD Progress Note  12/05/2015 1:14 PM David [name]  MRN:  [id number] Subjective: Patient states " I am fine - [facility]."  Objective: Patient is a 20 y old CM , who has a hx of schizophrenia , who presented to [facility]- for hearing [place] as well as asking to go to [facility]. Patient seen and chart reviewed.Discussed patient with treatment team. I have reviewed H&P as well as previous notes in ED. Pt today continues to be disorganized. Pt is alert, oriented to person and time - states it is christmas time and the year as 2016. Pt reports the place as [facility] and was told it is not, but [place]. Pt is a very limited historian- is unable to give any hx about his medical or psychiatric hx. Chart review suggests that pt was admitted to [facility] in the past - 03/21/12 and was referred to [facility]. Pt has been tried on Haldol, haldol decanoate IM, tegretol as well as Trazodone, ambien in the past. Pt currently is on Zyprexa 30 mg ( max dose) - unknown how long he has been on it. CSW to obtain collateral information regarding his medications as well as treatment hx. Per nursing pt continues to be disorganized- continues to need encouragement and support. Pt also had ? Exophthalmos - no TSH noted in EHR - will order the same.     Principal Problem: Paranoid schizophrenia ([place]) Diagnosis:   Patient Active Problem List   Diagnosis Date Noted  . History of idiopathic thrombocytopenic purpura [Z86.2] 12/05/2015  . Paranoid schizophrenia ([place]) [F20.0] 12/03/2015  . HTN (hypertension) [I10] 03/04/2012  . GERD (gastroesophageal reflux disease) [K21.9] 03/04/2012  . Insomnia [G47.00] 03/04/2012  . CKD (chronic kidney disease) stage 2, GFR 60-89 ml/min [N18.2] 03/04/2012  . Schizophrenia, paranoid, chronic ([place]) [F20.0] 02/18/2012   Total Time spent with patient: 30 minutes  Past Psychiatric History: Pt was admitted to [facility] - schizophrenia - in 2013, was discharged on Haldol decanoate IM - and was referred  to [facility].  Past Medical History:  Past Medical History  Diagnosis Date  . Schizophrenia ([place])   . Pneumonia   . Anxiety   . GERD (gastroesophageal reflux disease)   . COPD (chronic obstructive pulmonary disease) [facility])     Past Surgical History  Procedure Laterality Date  . Tonsillectomy     Family History:  Family History  Problem Relation Age of Onset  . Arthritis Mother   . Hypertension Mother    Family Psychiatric  History: Pt is unable to give information regarding family- csw working on obtaining collateral. Social History:  History  Alcohol Use No     History  Drug Use No    Comment: denies    Social History   Social History  . Marital Status: Legally Separated    Spouse Name: N/A  . Number of Children: N/A  . Years of Education: N/A   Social History Main Topics  . Smoking status: Current Every Day Smoker -- 1.00 packs/day for 40 years    Types: Cigarettes    Start date: 04/14/1974  . Smokeless tobacco: Never Used  . Alcohol Use: No  . Drug Use: No     Comment: denies  . Sexual Activity: No   Other Topics Concern  . None   Social History Narrative   Additional Social History:  Sleep: Fair  Appetite:  Fair  Current Medications: Current Facility-Administered Medications  Medication Dose Route Frequency Provider Last Rate Last Dose  . acetaminophen (TYLENOL) tablet 650 mg  650 mg Oral Q6H PRN Derrill Center, NP   650 mg at 12/05/15 0305  . albuterol (PROVENTIL HFA;VENTOLIN HFA) 108 (90 BASE) MCG/ACT inhaler 2 puff  2 puff Inhalation Q6H PRN Derrill Center, NP   2 puff at 12/04/15 0227  . alum & mag hydroxide-simeth (MAALOX/MYLANTA) 200-200-20 MG/5ML suspension 30 mL  30 mL Oral Q4H PRN Derrill Center, NP   30 mL at 12/04/15 0225  . benztropine (COGENTIN) tablet 0.5 mg  0.5 mg Oral QHS Derrill Center, NP   0.5 mg at 12/04/15 2026  . gabapentin (NEURONTIN) capsule 300 mg  300 mg Oral BID Derrill Center, NP   300 mg  at 12/05/15 0810  . lidocaine (LIDODERM) 5 % 1 patch  1 patch Transdermal Q24H [name], MD   1 patch at 12/05/15 1058  . LORazepam (ATIVAN) tablet 1 mg  1 mg Oral BID PRN [name], MD      . losartan (COZAAR) tablet 50 mg  50 mg Oral Daily Derrill Center, NP   50 mg at 12/05/15 0810  . magnesium hydroxide (MILK OF MAGNESIA) suspension 30 mL  30 mL Oral Daily PRN Derrill Center, NP   30 mL at 12/05/15 0239  . mirtazapine (REMERON) tablet 15 mg  15 mg Oral QHS PRN Derrill Center, NP   15 mg at 12/04/15 2025  . nicotine polacrilex (NICORETTE) gum 2 mg  2 mg Oral PRN [name], MD   2 mg at 12/04/15 1324  . OLANZapine (ZYPREXA) tablet 10 mg  10 mg Oral TID PRN [name], MD       Or  . OLANZapine (ZYPREXA) injection 10 mg  10 mg Intramuscular TID PRN [name], MD      . Derrill Memo ON 12/06/2015] OLANZapine (ZYPREXA) tablet 5 mg  5 mg Oral Daily Amarilys Lyles, MD      . paliperidone (INVEGA) 24 hr tablet 3 mg  3 mg Oral QHS Rishika Mccollom, MD      . pantoprazole (PROTONIX) EC tablet 40 mg  40 mg Oral Daily Derrill Center, NP   40 mg at 12/05/15 [id number]    Lab Results: No results found for this or any previous visit (from the past 47 hour(s)).  Physical Findings: AIMS: Facial and Oral Movements Muscles of Facial Expression: None, normal Lips and Perioral Area: None, normal Jaw: None, normal Tongue: None, normal,Extremity Movements Upper (arms, wrists, hands, fingers): None, normal Lower (legs, knees, ankles, toes): None, normal, Trunk Movements Neck, shoulders, hips: None, normal, Overall Severity Severity of abnormal movements (highest score from questions above): None, normal Incapacitation due to abnormal movements: None, normal Patient's awareness of abnormal movements (rate only patient's report): No Awareness, Dental Status Current problems with teeth and/or dentures?: No Does patient usually wear dentures?: No  CIWA:  CIWA-Ar Total: 10 COWS:  COWS Total Score:  8  Musculoskeletal: Strength & Muscle Tone: within normal limits Gait & Station: normal Patient leans: N/A  Psychiatric Specialty Exam: Review of Systems  Psychiatric/Behavioral: The patient is nervous/anxious.   All other systems reviewed and are negative.   Blood pressure 149/74, pulse 98, temperature 98.4 F (36.9 C), temperature source Oral, resp. rate 18, height 5\' 10"  (1.778 m), weight 86.183 kg (190 lb).Body mass index is 27.26 kg/(m^2).  General Appearance: Disheveled  Eye Contact::  Good staring  Speech:  Normal Rate  Volume:  Decreased talks in muffled voice  Mood:  Euphoric  Affect:  Restricted  Thought Process:  Disorganized, Irrelevant and Loose  Orientation:  Other:  person, time  Thought Content:  Rumination and seen as responding to internal stimuli - reports VH - Unable to elaborate  Suicidal Thoughts:  No  Homicidal Thoughts:  No  Memory:  Immediate;   Poor Recent;   Poor Remote;   Poor  Judgement:  Impaired  Insight:  Shallow  Psychomotor Activity:  Restlessness  Concentration:  Poor  Recall:  Poor  Fund of Knowledge:Poor  Language: Fair  Akathisia:  No  Handed:  Right  AIMS (if indicated):     Assets:  Others:  access to health care  ADL's:  Impaired  Cognition: Impaired,  Moderate pt is disorganized and is currently unable to participate fully.  Sleep:  Number of Hours: 4.5   Treatment Plan Summary:Patient presented disorganized , has limited insight, is a poor historian- will need to expand collateral information. In the mean time - will readjust medications and continue treatment.  Daily contact with patient to assess and evaluate symptoms and progress in treatment and Medication management   Will cross taper Zyprexa with Invega . Plan to DC pt on Invega sustenna IM - if he tolerates it well. Will continue Cogentin 0.5 mg po qhs for EPS. Will continue Gabapentin  300 mg po bid for anxiety sx. Will continue Remeron 15 mg po qhs for affective sx  as well as sleep. Will continue home medications as indicated. Reviewed past medical records,treatment plan.  Will continue to monitor vitals ,medication compliance and treatment side effects while patient is here.  Will monitor for medical issues as well as call consult as needed.  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 Pt also has a hx of CKD- Creatinine chronically elevated. Will order TSH - ?exophthalmos , also will order lipid panel, hba1c, pl . EKG - REVIEWED- WNL. UA- wnl. CSW will start working on disposition. CSW to obtain collateral information from family as well as past medical records. Patient to participate in therapeutic milieu .       Daleyza Gadomski MD 12/05/2015, 1:14 PM

## 2015-12-05 NOTE — Progress Notes (Signed)
PATIENT LACKS CAPACITY TO PARTICIPATE IN DISPOSITION PLANNING.  [name] ,MD Attending [facility]

## 2015-12-05 NOTE — Progress Notes (Signed)
Patient ID: David [name], male   DOB: 18-May-1956, 59 y.o.   MRN: [id number] PER STATE REGULATIONS 482.30  THIS CHART WAS REVIEWED FOR MEDICAL NECESSITY WITH RESPECT TO THE PATIENT'S ADMISSION/DURATION OF STAY.  NEXT REVIEW DATE:12/07/15  David Schanz, RN, BSN CASE MANAGER

## 2015-12-05 NOTE — Progress Notes (Signed)
DAR Note: Patient remained anxious and irritable.  Denies pain, auditory and visual hallucinations.  Rates depression at 10/10, hopelessness at 0/10, and anxiety at 10/10.  Reports withdrawal symptoms of tremors, diarrhea, sedation, chilling, runny nose and irritability on self inventory form.  Medications given as prescribed. Maintained on routine safety checks per protocol.  Attended groups and participated.  Support and encouragement offered as needed.

## 2015-12-05 NOTE — BHH Group Notes (Signed)
[place] Group Notes:  (Counselor/Nursing/MHT/Case Management/Adjunct)  12/05/2015 1:15PM  Type of Therapy:  Group Therapy  Participation Level:  Active  Participation Quality:  Appropriate  Affect:  Flat  Cognitive:  Oriented  Insight:  Improving  Engagement in Group:  Limited  Engagement in Therapy:  Limited  Modes of Intervention:  Discussion, Exploration and Socialization  Summary of Progress/Problems: The topic for group was balance in life.  Pt participated in the discussion about when their life was in balance and out of balance and how this feels.  Pt discussed ways to get back in balance and short term goals they can work on to get where they want to be. Stayed the whole time, unfortunately. Disorganized. Carried on an on-going conversation with another patient.  Would stop periodically when reminded, but then continue.  Blurted out random thoughts periodically.  "[facility]"  We worked it into the topic of the day.    Roque Lias B 12/05/2015 3:06 PM

## 2015-12-05 NOTE — Progress Notes (Signed)
Patient got up around 3:30am asking for his shoes.  When Writer explained to patient the time and that they were in his locker and he would not be able to retrieve his shoes at this time patient became angry with Probation officer.  Patient used profanity stating, "I should be able to have my F----ing shoes."  Patient then walked away back to his room.  Patient came back out approximately 10 minutes later and asked for something to drink.

## 2015-12-05 NOTE — BHH Counselor (Incomplete)
Adult Comprehensive Assessment  Patient ID: David [name], male DOB: 1956-08-22, 59 y.o. MRN: [id number]  Information Source:    Current Stressors:  Educational / Learning stressors: no problems reported Employment / Job issues: unemployed, disability Family Relationships: no problems reported Museum/gallery curator / Lack of resources (include bankruptcy): fixed income Housing / Lack of housing: lives with brother Physical health (include injuries & life threatening diseases): no problems reported Social relationships: lacks social support Substance abuse: unknown Bereavement / Loss: death of aunt  Living/Environment/Situation:  Living Arrangements: Family members (lives with his brother) Living conditions (as described by patient or guardian): small How long has patient lived in current situation?: 2 years What is atmosphere in current home: Comfortable  Family History:  Marital status: Divorced Does patient have children?: Yes How many children?: 3  How is patient's relationship with their children?: they are in the sky, don't know if they are boys or girls.  Childhood History:  By whom was/is the patient raised?: Mother/father and step-parent (mother and step-father) Description of patient's relationship with caregiver when they were a child: "tight" Patient's description of current relationship with people who raised him/her: step-father deceased Does patient have siblings?: Yes Number of Siblings: 1 (at least one brother) Description of patient's current relationship with siblings: good  Education:  Highest grade of school patient has completed: graduated high school  Currently a student?: No Learning disability?: No  Employment/Work Situation:  Employment situation: On disability Why is patient on disability: mental illness Has patient ever been in the [employer]?: No Has patient ever served in Recruitment consultant?: No  Financial Resources:     Alcohol/Substance Abuse:      Social Support System:  Type of faith/religion: Horticulturist, commercial:  Leisure and Hobbies: ping pong, tennis, crafts  Strengths/Needs:     Discharge Plan:  Will patient be returning to same living situation after discharge?: Yes  Summary/Recommendations:  Summary and Recommendations (to be completed by the evaluator): Several attempts were made to complet assessment, however patient was too psychotic, or herefused to answer. As of today, he continues to be a poor historian as evidenced by his answers. Patient is a 59 year old male with diagnosis of Psychotic Disorder, NOS, Schizophrenia. He was admitted due to talking to the TV, stating that he is the king, not sleeping, pacing, and increased aggression. He has not been able to get medications. His trigger mya have been deaths of aunt and step-father. Patient will benefit from crisis stabilization, medication evaluation, group therapy and psychoeducation groups for coping skills, case managemnet for referrals and counselor to talk to family fro collateral and discharge planning.  Hartis, Cynthia. 02/22/2012

## 2015-12-06 LAB — LIPID PANEL
CHOLESTEROL: 170 mg/dL (ref 0–200)
HDL: 37 mg/dL — ABNORMAL LOW (ref >40)
LDL Cholesterol: 111 mg/dL — ABNORMAL HIGH (ref 0–99)
TRIGLYCERIDES: 108 mg/dL (ref <150)
Total CHOL/HDL Ratio: 4.6 RATIO
VLDL: 22 mg/dL (ref 0–40)

## 2015-12-06 LAB — VITAMIN B12: Vitamin B-12: 630 pg/mL (ref 180–914)

## 2015-12-06 LAB — TSH: TSH: 1.396 u[IU]/mL (ref 0.350–4.500)

## 2015-12-06 LAB — FOLATE: Folate: 24.6 ng/mL (ref >5.9)

## 2015-12-06 MED ORDER — ENSURE ENLIVE PO LIQD
237.0000 mL | Freq: Two times a day (BID) | ORAL | Status: DC
  Administered 2015-12-06 – 2015-12-29 (×40): 237 mL via ORAL

## 2015-12-06 MED ORDER — OLANZAPINE 2.5 MG PO TABS
2.5000 mg | ORAL_TABLET | Freq: Every day | ORAL | Status: DC
  Administered 2015-12-07 – 2015-12-08 (×2): 2.5 mg via ORAL
  Filled 2015-12-06 (×4): qty 1

## 2015-12-06 MED ORDER — PALIPERIDONE ER 6 MG PO TB24
6.0000 mg | ORAL_TABLET | Freq: Every day | ORAL | Status: DC
  Administered 2015-12-06: 6 mg via ORAL
  Filled 2015-12-06 (×3): qty 1

## 2015-12-06 NOTE — Tx Team (Signed)
Interdisciplinary Treatment Plan Update (Adult)  Date:  12/06/2015   Time Reviewed:  2:54 PM   Progress in Treatment: Attending groups: Yes. Participating in groups:  Yes. Taking medication as prescribed:  Yes. Tolerating medication:  Yes. Family/Significant other contact made: Yes  ACT team  Patient understands diagnosis:  No  Limited insight Discussing patient identified problems/goals with staff:  Yes, see initial care plan. Medical problems stabilized or resolved:  Yes. Denies suicidal/homicidal ideation: Yes. Issues/concerns per patient self-inventory:  No. Other:  New problem(s) identified:  Discharge Plan or Barriers: see below  Reason for Continuation of Hospitalization: Medication stabilization Other; describe Disorganization, Flight of ideas  Comments:  Patient presents disorganized , has limited insight, is a poor historian. Collateral information was obtained from ACTT. Will continue medication readjustment.    Will cross taper Zyprexa with Invega . Plan to DC pt on Invega sustenna IM - if he tolerates it well. Will not restart Haldol at this time. Will continue Cogentin 0.5 mg po qhs for EPS. Will continue Gabapentin 300 mg po bid for anxiety sx. Will continue Remeron 15 mg po qhs for affective sx as well as sleep.  Estimated length of stay: 4-5 days  New goal(s):  Review of initial/current patient goals per problem list:   Review of initial/current patient goals per problem list:  1. Goal(s): Patient will participate in aftercare plan   Met: Yes   Target date: 3-5 days post admission date   As evidenced by: Patient will participate within aftercare plan AEB aftercare provider and housing plan at discharge being identified.  12/06/2015: Return home, follow up outpt        5. Goal(s): Patient will demonstrate decreased signs of psychosis  * Met: No  * Target date: 3-5 days post admission date  * As evidenced by: Patient will demonstrate  decreased frequency of AVH or return to baseline function 12/06/15:  Bizarre, disorganized, poor historian           Attendees: Patient:  12/06/2015 2:54 PM   Family:   12/06/2015 2:54 PM   Physician:  [name], MD 12/06/2015 2:54 PM   Nursing:   Manuella Ghazi, RN 12/06/2015 2:54 PM   CSW:    Roque Lias, LCSW   12/06/2015 2:54 PM   Other:  12/06/2015 2:54 PM   Other:   12/06/2015 2:54 PM   Other:  Lars Pinks, Nurse CM 12/06/2015 2:54 PM   Other:   12/06/2015 2:54 PM   Other:  Norberto Sorenson, [place]  12/06/2015 2:54 PM   Other:  12/06/2015 2:54 PM   Other:  12/06/2015 2:54 PM   Other:  12/06/2015 2:54 PM   Other:  12/06/2015 2:54 PM   Other:  12/06/2015 2:54 PM   Other:   12/06/2015 2:54 PM    Scribe for Treatment Team:   Trish Mage, 12/06/2015 2:54 PM

## 2015-12-06 NOTE — BHH Group Notes (Signed)
[place] LCSW Group Therapy  12/06/2015 , 1:31 PM   Type of Therapy:  Group Therapy  Participation Level:  Active  Participation Quality:  Attentive  Affect:  Appropriate  Cognitive:  Alert  Insight:  Improving  Engagement in Therapy:  Engaged  Modes of Intervention:  Discussion, Exploration and Socialization  Summary of Progress/Problems: Today's group focused on the term Diagnosis.  Participants were asked to define the term, and then pronounce whether it is a negative, positive or neutral term.  Disorganized accompanied by a stare.  "Can I go home to get my shoes?  Let's go to paternity court to get this whole thing settled."  [place] B 12/06/2015 , 1:31 PM

## 2015-12-06 NOTE — BHH Group Notes (Signed)
[place] Group Notes:  (Nursing/MHT/Case Management/Adjunct)  Date:  12/06/2015  Time:  2:15 PM  Type of Therapy:  Nurse Education  Participation Level:  Active  Participation Quality:  Intrusive and Monopolizing  Affect:  Flat and Irritable  Cognitive:  Disorganized and Delusional  Insight:  None  Engagement in Group:  Distracting and Off Topic  Modes of Intervention:  Discussion and Education  Summary of Progress/Problems:  Group topic was 'Recovery.'  Discussed healthy coping skills and appropriate goal setting.  Antonie was unable to stay on topic.  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  He was able to be redirected but needed multiple redirections during the group.     [place] 12/06/2015, 2:15 PM

## 2015-12-06 NOTE — Progress Notes (Signed)
D: Pt is very confused, paranoid and delusional. Pt with rapid and gabbled speech is very difficult to understand. Pt could also be instructive and easily irritable. Pt denies SI, HI, anxiety, depression and pain. Pt remained calm, cooperative, and nonviolent through the shift assessment.  A: Medications offered as prescribed.  Support, encouragement, and safe environment provided.  15-minute safety checks continue.  R: Pt was med compliant.  Pt attended wrap-up group. Safety checks continue

## 2015-12-06 NOTE — Clinical Social Work Note (Signed)
Unable to conduct PSA.  Pt too disorganized.

## 2015-12-06 NOTE — Progress Notes (Addendum)
[facility] MD Progress Note  12/06/2015 11:27 AM David [name]  MRN:  [id number] Subjective: Patient states " I am OK.'    Objective: Patient is a 64 y old CM , who has a hx of schizophrenia , who presented to [place]- for hearing AH as well as asking to go to [facility]. Patient seen and chart reviewed today .Discussed patient with treatment team.  Pt today continues to be disorganized , although his speech is easier to understand . Pt is alert , oriented to person, place and time. Pt is a very limited historian- and hence collateral information was obtained from ACTT per CSW- " Per ACTT- Pt has a chronic hx of schizophrenia , was at [facility] in April 2016. Pt was doing well for a while , but then started decompensating. Pt was noncompliant on his PO medications - but he is due for his Haldol decanoate IM 12/16.   Per nursing - pt continues to be bizarre, his speech loose , tangential . He is compliant on his medications . Denies ADRs.      Principal Problem: Paranoid schizophrenia ([place]) Diagnosis:   Patient Active Problem List   Diagnosis Date Noted  . History of idiopathic thrombocytopenic purpura [Z86.2] 12/05/2015  . Paranoid schizophrenia ([place]) [F20.0] 12/03/2015  . HTN (hypertension) [I10] 03/04/2012  . GERD (gastroesophageal reflux disease) [K21.9] 03/04/2012  . Insomnia [G47.00] 03/04/2012  . CKD (chronic kidney disease) stage 2, GFR 60-89 ml/min [N18.2] 03/04/2012  . Schizophrenia, paranoid, chronic ([place]) [F20.0] 02/18/2012   Total Time spent with patient: 30 minutes  Past Psychiatric History: Pt was admitted to [facility] - schizophrenia - in 2013, was discharged on Haldol decanoate IM - and was referred to [facility].Pt also had recent hospitalization at [facility] - April 2016. Pt has an ACTT.  Past Medical History:  Past Medical History  Diagnosis Date  . Schizophrenia ([place])   . Pneumonia   . Anxiety   . GERD (gastroesophageal reflux disease)   . COPD (chronic obstructive pulmonary disease) [facility])      Past Surgical History  Procedure Laterality Date  . Tonsillectomy     Family History:  Family History  Problem Relation Age of Onset  . Arthritis Mother   . Hypertension Mother    Family Psychiatric  History: Pt did not express any hx of mental illness in family. Social History: Pt lives by self in [place], [place]. Pt is single. History  Alcohol Use No     History  Drug Use No    Comment: denies    Social History   Social History  . Marital Status: Legally Separated    Spouse Name: N/A  . Number of Children: N/A  . Years of Education: N/A   Social History Main Topics  . Smoking status: Current Every Day Smoker -- 1.00 packs/day for 40 years    Types: Cigarettes    Start date: 04/14/1974  . Smokeless tobacco: Never Used  . Alcohol Use: No  . Drug Use: No     Comment: denies  . Sexual Activity: No   Other Topics Concern  . None   Social History Narrative   Additional Social History:                         Sleep: Fair  Appetite:  Fair  Current Medications: Current Facility-Administered Medications  Medication Dose Route Frequency Provider Last Rate Last Dose  . acetaminophen (TYLENOL) tablet 650 mg  650 mg Oral Q6H  PRN Derrill Center, NP   650 mg at 12/05/15 0305  . albuterol (PROVENTIL HFA;VENTOLIN HFA) 108 (90 BASE) MCG/ACT inhaler 2 puff  2 puff Inhalation Q6H PRN Derrill Center, NP   2 puff at 12/04/15 0227  . alum & mag hydroxide-simeth (MAALOX/MYLANTA) 200-200-20 MG/5ML suspension 30 mL  30 mL Oral Q4H PRN Derrill Center, NP   30 mL at 12/04/15 0225  . benztropine (COGENTIN) tablet 0.5 mg  0.5 mg Oral QHS Derrill Center, NP   0.5 mg at 12/05/15 2108  . feeding supplement (ENSURE ENLIVE) (ENSURE ENLIVE) liquid 237 mL  237 mL Oral BID BM Alberto Schoch, MD   237 mL at 12/06/15 1016  . gabapentin (NEURONTIN) capsule 300 mg  300 mg Oral BID Derrill Center, NP   300 mg at 12/06/15 0829  . lidocaine (LIDODERM) 5 % 1 patch  1 patch Transdermal Q24H  [name], MD   1 patch at 12/06/15 1052  . LORazepam (ATIVAN) tablet 1 mg  1 mg Oral BID PRN [name], MD   1 mg at 12/05/15 2308  . losartan (COZAAR) tablet 50 mg  50 mg Oral Daily Derrill Center, NP   50 mg at 12/06/15 0829  . magnesium hydroxide (MILK OF MAGNESIA) suspension 30 mL  30 mL Oral Daily PRN Derrill Center, NP   30 mL at 12/05/15 0239  . mirtazapine (REMERON) tablet 15 mg  15 mg Oral QHS PRN Derrill Center, NP   15 mg at 12/05/15 2108  . nicotine polacrilex (NICORETTE) gum 2 mg  2 mg Oral PRN [name], MD   2 mg at 12/06/15 1056  . OLANZapine (ZYPREXA) tablet 10 mg  10 mg Oral TID PRN [name], MD   10 mg at 12/05/15 1513   Or  . OLANZapine (ZYPREXA) injection 10 mg  10 mg Intramuscular TID PRN [name], MD      . Derrill Memo ON 12/07/2015] OLANZapine (ZYPREXA) tablet 2.5 mg  2.5 mg Oral Daily Nazair Fortenberry, MD      . paliperidone (INVEGA) 24 hr tablet 6 mg  6 mg Oral QHS Ruchel Brandenburger, MD      . pantoprazole (PROTONIX) EC tablet 40 mg  40 mg Oral Daily Derrill Center, NP   40 mg at 12/06/15 [id number]    Lab Results:  Results for orders placed or performed during the hospital encounter of 12/03/15 (from the past 48 hour(s))  TSH     Status: None   Collection Time: 12/06/15  7:10 AM  Result Value Ref Range   TSH 1.396 0.350 - 4.500 uIU/mL    Comment: Performed at [facility]  Lipid panel     Status: Abnormal   Collection Time: 12/06/15  7:40 AM  Result Value Ref Range   Cholesterol 170 0 - 200 mg/dL   Triglycerides 108 <150 mg/dL   HDL 37 (L) >40 mg/dL   Total CHOL/HDL Ratio 4.6 RATIO   VLDL 22 0 - 40 mg/dL   LDL Cholesterol 111 (H) 0 - 99 mg/dL    Comment:        Total Cholesterol/HDL:CHD Risk Coronary Heart Disease Risk Table                     Men   Women  1/2 Average Risk   3.4   3.3  Average Risk       5.0   4.4  2 X  Average Risk   9.6   7.1  3 X Average Risk  23.4   11.0        Use the calculated Patient Ratio above  and the CHD Risk Table to determine the patient's CHD Risk.        ATP III CLASSIFICATION (LDL):  <100     mg/dL   Optimal  100-129  mg/dL   Near or Above                    Optimal  130-159  mg/dL   Borderline  160-189  mg/dL   High  >190     mg/dL   Very High Performed at [facility]     Physical Findings: AIMS: Facial and Oral Movements Muscles of Facial Expression: None, normal Lips and Perioral Area: None, normal Jaw: None, normal Tongue: None, normal,Extremity Movements Upper (arms, wrists, hands, fingers): None, normal Lower (legs, knees, ankles, toes): None, normal, Trunk Movements Neck, shoulders, hips: None, normal, Overall Severity Severity of abnormal movements (highest score from questions above): None, normal Incapacitation due to abnormal movements: None, normal Patient's awareness of abnormal movements (rate only patient's report): No Awareness, Dental Status Current problems with teeth and/or dentures?: No Does patient usually wear dentures?: No  CIWA:  CIWA-Ar Total: 10 COWS:  COWS Total Score: 8  Musculoskeletal: Strength & Muscle Tone: within normal limits Gait & Station: normal Patient leans: N/A  Psychiatric Specialty Exam: Review of Systems  Psychiatric/Behavioral: The patient is nervous/anxious.   All other systems reviewed and are negative.   Blood pressure 118/77, pulse 109, temperature 98.3 F (36.8 C), temperature source Oral, resp. rate 20, height 5\' 10"  (1.778 m), weight 86.183 kg (190 lb).Body mass index is 27.26 kg/(m^2).  General Appearance: Disheveled  Eye Contact::  Good staring  Speech:  Normal Rate  Volume:  Decreased talks in muffled voice  Mood:  Euphoric  Affect:  Restricted  Thought Process:  Disorganized, Irrelevant and Loose  Orientation:  Full (Time, Place, and Person)  Thought Content:  Hallucinations: Auditory and Rumination unable to elaborate on his AH.  Suicidal Thoughts:  No  Homicidal Thoughts:  No   Memory:  Immediate;   Poor Recent;   Poor Remote;   Poor  Judgement:  Impaired  Insight:  Shallow  Psychomotor Activity:  Restlessness  Concentration:  Poor  Recall:  Poor  Fund of Knowledge:Poor  Language: Fair  Akathisia:  No  Handed:  Right  AIMS (if indicated):     Assets:  Others:  access to health care  ADL's:  Impaired  Cognition: Impaired,  Moderate pt is disorganized and is currently unable to participate fully.  Sleep:  Number of Hours: 5.5   Treatment Plan Summary:Patient presented disorganized , has limited insight, is a poor historian. Collateral information was obtained from ACTT. Will continue medication readjustment.  Daily contact with patient to assess and evaluate symptoms and progress in treatment and Medication management   Will cross taper Zyprexa with Invega . Plan to DC pt on Invega sustenna IM - if he tolerates it well. Will not restart Haldol at this time. Will continue Cogentin 0.5 mg po qhs for EPS. Will continue Gabapentin  300 mg po bid for anxiety sx. Will continue Remeron 15 mg po qhs for affective sx as well as sleep. Will continue home medications as indicated. Reviewed past medical records,treatment plan.  Will continue to monitor vitals ,medication compliance and treatment side effects while patient is  here.  Will monitor for medical issues as well as call consult as needed.  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 Pt also has a hx of CKD- Creatinine chronically elevated. Will order TSH - wnl ,lipid panel- wnl , pending hba1c, pl.  CSW will start working on disposition.  Patient to participate in therapeutic milieu .       Nijae Doyel MD 12/06/2015, 11:27 AM

## 2015-12-06 NOTE — Progress Notes (Signed)
D:Affect is angry at times,easily irritated,loud and intrusive. Continues with disorganized thinking and is tangential in speech at times.Argunentative with staff and peers but is usually easily redirected. Seems focused on aftercare and D/C . A:Support and encouragement offered. R:Continues with intrusiveness with occasional loud outbursts towards others.

## 2015-12-07 ENCOUNTER — Encounter (HOSPITAL_COMMUNITY): Payer: Self-pay | Admitting: Psychiatry

## 2015-12-07 DIAGNOSIS — E221 Hyperprolactinemia: Secondary | ICD-10-CM | POA: Clinically undetermined

## 2015-12-07 LAB — HEMOGLOBIN A1C
HEMOGLOBIN A1C: 5.5 % (ref 4.8–5.6)
MEAN PLASMA GLUCOSE: 111 mg/dL

## 2015-12-07 LAB — PROLACTIN: Prolactin: 69.1 ng/mL — ABNORMAL HIGH (ref 4.0–15.2)

## 2015-12-07 MED ORDER — PALIPERIDONE ER 3 MG PO TB24
9.0000 mg | ORAL_TABLET | Freq: Every day | ORAL | Status: DC
  Administered 2015-12-07 – 2015-12-11 (×5): 9 mg via ORAL
  Filled 2015-12-07 (×6): qty 3

## 2015-12-07 MED ORDER — PALIPERIDONE PALMITATE 234 MG/1.5ML IM SUSP
234.0000 mg | Freq: Once | INTRAMUSCULAR | Status: AC
  Administered 2015-12-08: 234 mg via INTRAMUSCULAR

## 2015-12-07 NOTE — Progress Notes (Signed)
D:Pt up in mileu talking out loud with random disorganized thoughts. Patient dishevled and has a strong body odor. Remains very labile at times verbally aggresive to some of his peers and to staff.but patient is easily redirectable. Patient has a  non-productive cough. "If you would let me smoke my cough would go away". Denies SI/HI/AVH. A:Emotional support given to patient.  Safety checks continue every 15 minutes. Scheduled meds given per MD orders. Prn's given see MAR. R: Patient remains safe on the unit. Taking meds as ordered. Patient appears calmer.

## 2015-12-07 NOTE — Progress Notes (Signed)
Patient ID: David [name], male   DOB: 1956/05/30, 59 y.o.   MRN: [id number] PER STATE REGULATIONS 482.30  THIS CHART WAS REVIEWED FOR MEDICAL NECESSITY WITH RESPECT TO THE PATIENT'S ADMISSION/ DURATION OF STAY.  NEXT REVIEW DATE: 12/11/2015   Chauncy Lean, RN, BSN CASE MANAGER

## 2015-12-07 NOTE — BHH Group Notes (Signed)
[facility] LCSW Aftercare Discharge Planning Group Note   12/07/2015 10:41 AM  Participation Quality: Active   Mood/Affect:  Appropriate  Depression Rating:  Denies   Anxiety Rating:  Denies  Thoughts of Suicide:  No Will you contract for safety?   No  Current AVH:  No  Plan for Discharge/Comments:  Pt reports that he is "doing great" and is ready to leave. Pt exhibits tangential thinking. CSW asked about the ACT team and pt went on to speak about how one of his ACT team workers is retiring and advice for her retirement.  Transportation Means:   Supports:  Georga Kaufmann

## 2015-12-07 NOTE — Progress Notes (Signed)
D: Pt completed self inventory form with help from this nurse. Pt reports he slept good last night with the use of sleep medication. He reports a fair appetite, normal energy level, good concentration. He rates depression 3/10, hopelessness 3/10, anxiety 2/10- all on 0-10 scale, 10 being the worse. Pt reports his goal is "go to the house and back here!" Pt reports he will "got to call Erica from Act team 1 or 2" to help meet his goal. Labile, bizarre. Denies SI/HI. Disorganized. Loud outburst at times.  A:Special checks q 15 mins in place for safety. Medication administered per MD order. (see eMAR). Encouragement and support provided.  R:safety maintained. Compliant with medication regimen. Will continue to monitor.

## 2015-12-07 NOTE — Plan of Care (Signed)
Problem: Alteration in thought process Goal: LTG-Patient behavior demonstrates decreased signs psychosis (Patient behavior demonstrates decreased signs of psychosis to the point the patient is safe to return home and continue treatment in an outpatient setting.)  Outcome: Not Progressing Patient continues to state he is having the police to take him home and then will bring him back.

## 2015-12-07 NOTE — Progress Notes (Signed)
[facility] MD Progress Note  12/07/2015 2:37 PM Samay Burritt  MRN:  [id number] Subjective: Patient states " I am fine , I want to go home, take care of some stuff and then come back."    Objective: Patient is a 81 y old CM , who has a hx of schizophrenia , who presented to [place]- for hearing [place] as well as asking to go to [facility]. Patient seen and chart reviewed today .Discussed patient with treatment team.  Pt today continues to be disorganized , delusional and labile. Pt seen as being loud on the unit on and off and requires redirection from staff. Pt is alert , oriented to person, place and time. Per nursing pt is compliant on medications - continues to need a lot of encouragement and support.       Pt is a very limited historian- and hence collateral information was obtained from ACTT per CSW- " Per ACTT- Pt has a chronic hx of schizophrenia , was at [facility] in April 2016. Pt was doing well for a while , but then started decompensating. Pt was noncompliant on his PO medications - but he is due for his Haldol decanoate IM 12/16.        Principal Problem: Paranoid schizophrenia ([place]) Diagnosis:   Patient Active Problem List   Diagnosis Date Noted  . 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 History of idiopathic thrombocytopenic purpura [Z86.2] 12/05/2015  . Paranoid schizophrenia ([place]) [F20.0] 12/03/2015  . HTN (hypertension) [I10] 03/04/2012  . GERD (gastroesophageal reflux disease) [K21.9] 03/04/2012  . Insomnia [G47.00] 03/04/2012  . CKD (chronic kidney disease) stage 2, GFR 60-89 ml/min [N18.2] 03/04/2012  . Schizophrenia, paranoid, chronic ([place]) [F20.0] 02/18/2012   Total Time spent with patient: 30 minutes  Past Psychiatric History: Pt was admitted to [facility] - schizophrenia - in 2013, was discharged on Haldol decanoate IM - and was referred to [facility].Pt also had recent hospitalization at [facility] - April 2016. Pt has an ACTT.  Past Medical History:  Past Medical History  Diagnosis  Date  . Schizophrenia ([place])   . Pneumonia   . Anxiety   . GERD (gastroesophageal reflux disease)   . COPD (chronic obstructive pulmonary disease) [facility])     Past Surgical History  Procedure Laterality Date  . Tonsillectomy     Family History:  Family History  Problem Relation Age of Onset  . Arthritis Mother   . Hypertension Mother    Family Psychiatric  History: Pt did not express any hx of mental illness in family. Social History: Pt lives by self in [place], [place]. Pt is single. History  Alcohol Use No     History  Drug Use No    Comment: denies    Social History   Social History  . Marital Status: Legally Separated    Spouse Name: N/A  . Number of Children: N/A  . Years of Education: N/A   Social History Main Topics  . Smoking status: Current Every Day Smoker -- 1.00 packs/day for 40 years    Types: Cigarettes    Start date: 04/14/1974  . Smokeless tobacco: Never Used  . Alcohol Use: No  . Drug Use: No     Comment: denies  . Sexual Activity: No   Other Topics Concern  . None   Social History Narrative   Additional Social History:                         Sleep: Fair  Appetite:  Fair  Current Medications: Current Facility-Administered Medications  Medication Dose Route Frequency Provider Last Rate Last Dose  . acetaminophen (TYLENOL) tablet 650 mg  650 mg Oral Q6H PRN Derrill Center, NP   650 mg at 12/07/15 1037  . albuterol (PROVENTIL HFA;VENTOLIN HFA) 108 (90 BASE) MCG/ACT inhaler 2 puff  2 puff Inhalation Q6H PRN Derrill Center, NP   2 puff at 12/04/15 0227  . alum & mag hydroxide-simeth (MAALOX/MYLANTA) 200-200-20 MG/5ML suspension 30 mL  30 mL Oral Q4H PRN Derrill Center, NP   30 mL at 12/04/15 0225  . benztropine (COGENTIN) tablet 0.5 mg  0.5 mg Oral QHS Derrill Center, NP   0.5 mg at 12/06/15 2126  . feeding supplement (ENSURE ENLIVE) (ENSURE ENLIVE) liquid 237 mL  237 mL Oral BID BM Dajon Rowe, MD   237 mL at 12/07/15 1435  .  gabapentin (NEURONTIN) capsule 300 mg  300 mg Oral BID Derrill Center, NP   300 mg at 12/07/15 0800  . lidocaine (LIDODERM) 5 % 1 patch  1 patch Transdermal Q24H [name], MD   1 patch at 12/07/15 1134  . LORazepam (ATIVAN) tablet 1 mg  1 mg Oral BID PRN [name], MD   1 mg at 12/07/15 1022  . losartan (COZAAR) tablet 50 mg  50 mg Oral Daily Derrill Center, NP   50 mg at 12/07/15 0800  . magnesium hydroxide (MILK OF MAGNESIA) suspension 30 mL  30 mL Oral Daily PRN Derrill Center, NP   30 mL at 12/05/15 0239  . mirtazapine (REMERON) tablet 15 mg  15 mg Oral QHS PRN Derrill Center, NP   15 mg at 12/06/15 2225  . nicotine polacrilex (NICORETTE) gum 2 mg  2 mg Oral PRN [name], MD   2 mg at 12/07/15 1137  . OLANZapine (ZYPREXA) tablet 10 mg  10 mg Oral TID PRN [name], MD   10 mg at 12/07/15 1205   Or  . OLANZapine (ZYPREXA) injection 10 mg  10 mg Intramuscular TID PRN [name], MD      . OLANZapine (ZYPREXA) tablet 2.5 mg  2.5 mg Oral Daily Bianco Cange, MD   2.5 mg at 12/07/15 0800  . paliperidone (INVEGA) 24 hr tablet 9 mg  9 mg Oral QHS Brandun Pinn, MD      . pantoprazole (PROTONIX) EC tablet 40 mg  40 mg Oral Daily Derrill Center, NP   40 mg at 12/07/15 0800    Lab Results:  Results for orders placed or performed during the hospital encounter of 12/03/15 (from the past 48 hour(s))  TSH     Status: None   Collection Time: 12/06/15  7:10 AM  Result Value Ref Range   TSH 1.396 0.350 - 4.500 uIU/mL    Comment: Performed at [facility]  Hemoglobin A1c     Status: None   Collection Time: 12/06/15  7:10 AM  Result Value Ref Range   Hgb A1c MFr Bld 5.5 4.8 - 5.6 %    Comment: (NOTE)         Pre-diabetes: 5.7 - 6.4         Diabetes: >6.4         Glycemic control for adults with diabetes: <7.0    Mean Plasma Glucose 111 mg/dL    Comment: (NOTE) Performed At: [facility] [place], [place] [id number] Lindon Romp MD [id number] Performed  at [facility]   Lipid panel     Status: Abnormal   Collection Time: 12/06/15  7:40 AM  Result Value Ref Range   Cholesterol 170 0 - 200 mg/dL   Triglycerides 108 <150 mg/dL   HDL 37 (L) >40 mg/dL   Total CHOL/HDL Ratio 4.6 RATIO   VLDL 22 0 - 40 mg/dL   LDL Cholesterol 111 (H) 0 - 99 mg/dL    Comment:        Total Cholesterol/HDL:CHD Risk Coronary Heart Disease Risk Table                     Men   Women  1/2 Average Risk   3.4   3.3  Average Risk       5.0   4.4  2 X Average Risk   9.6   7.1  3 X Average Risk  23.4   11.0        Use the calculated Patient Ratio above and the CHD Risk Table to determine the patient's CHD Risk.        ATP III CLASSIFICATION (LDL):  <100     mg/dL   Optimal  100-129  mg/dL   Near or Above                    Optimal  130-159  mg/dL   Borderline  160-189  mg/dL   High  >190     mg/dL   Very High Performed at [facility]   Prolactin     Status: Abnormal   Collection Time: 12/06/15  7:40 AM  Result Value Ref Range   Prolactin 69.1 (H) 4.0 - 15.2 ng/mL    Comment: (NOTE) Performed At: [facility] [place], [place] [id number] Lindon Romp MD [id number] Performed at [facility]   Vitamin B12     Status: None   Collection Time: 12/06/15  6:24 PM  Result Value Ref Range   Vitamin B-12 630 180 - 914 pg/mL    Comment: (NOTE) This assay is not validated for testing neonatal or myeloproliferative syndrome specimens for Vitamin B12 levels. Performed at [facility]   Folate     Status: None   Collection Time: 12/06/15  6:24 PM  Result Value Ref Range   Folate 24.6 >5.9 ng/mL    Comment: Performed at [facility]    Physical Findings: AIMS: Facial and Oral Movements Muscles of Facial Expression: None, normal Lips and Perioral Area: None, normal Jaw: None, normal Tongue: None, normal,Extremity Movements Upper (arms,  wrists, hands, fingers): None, normal Lower (legs, knees, ankles, toes): None, normal, Trunk Movements Neck, shoulders, hips: None, normal, Overall Severity Severity of abnormal movements (highest score from questions above): None, normal Incapacitation due to abnormal movements: None, normal Patient's awareness of abnormal movements (rate only patient's report): No Awareness, Dental Status Current problems with teeth and/or dentures?: No Does patient usually wear dentures?: No  CIWA:  CIWA-Ar Total: 10 COWS:  COWS Total Score: 8  Musculoskeletal: Strength & Muscle Tone: within normal limits Gait & Station: normal Patient leans: N/A  Psychiatric Specialty Exam: Review of Systems  Psychiatric/Behavioral: The patient is nervous/anxious.   All other systems reviewed and are negative.   Blood pressure 132/56, pulse 93, temperature 98.4 F (36.9 C), temperature source Oral, resp. rate 20, height 5\' 10"  (1.778 m), weight 86.183 kg (190 lb).Body mass index is 27.26 kg/(m^2).  General  Appearance: Disheveled  Eye Contact::  Good staring  Speech:  Normal Rate  Volume:  Increased loud , yelling  Mood:  Angry, Anxious and Irritable  Affect:  Labile  Thought Process:  Disorganized and Loose with some improvement  Orientation:  Full (Time, Place, and Person)  Thought Content:  Delusions, Paranoid Ideation and Rumination   Suicidal Thoughts:  No  Homicidal Thoughts:  No  Memory:  Immediate;   Fair Recent;   Poor Remote;   Poor  Judgement:  Impaired  Insight:  Shallow  Psychomotor Activity:  Restlessness  Concentration:  Poor  Recall:  Poor  Fund of Knowledge:Poor  Language: Fair  Akathisia:  No  Handed:  Right  AIMS (if indicated):     Assets:  Others:  access to health care  ADL's:  Impaired  Cognition: WNL   Sleep:  Number of Hours: 6.25   Treatment Plan Summary:Patient presented disorganized ,delusional. Pt also has labile mood, requires a lot of redirection on the unit. Will  continue medication readjustment.  Daily contact with patient to assess and evaluate symptoms and progress in treatment and Medication management   Will increase Invega to 9 mg po qhs. Plan to DC pt on Invega sustenna IM - if he tolerates it well. Will provide Invega sustenna 234 mg IM tomorrow. Will not restart Haldol at this time. Will continue Cogentin 0.5 mg po qhs for EPS. Will continue Gabapentin  300 mg po bid for anxiety sx. Will continue Remeron 15 mg po qhs for affective sx as well as sleep. Will continue home medications as indicated. Reviewed past medical records,treatment plan.  Will continue to monitor vitals ,medication compliance and treatment side effects while patient is here.  Will monitor for medical issues as well as call consult as needed.  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 Pt also has a hx of CKD- Creatinine chronically elevated.  TSH - wnl ,lipid panel- wnl ,  hba1c- wnl , PL is elevated - likely 2/2 neuroleptics- will monitor - will consider adding Abilify. Vistamin b12- wnl, folate - wnl. CSW will start working on disposition.  Patient to participate in therapeutic milieu .       Narda Fundora MD 12/07/2015, 2:37 PM

## 2015-12-07 NOTE — BHH Group Notes (Signed)
[place] LCSW Group Therapy  12/07/2015 2:09 PM  Type of Therapy: Group Therapy  Participation Level: Active  Participation Quality: Attentive  Affect: Flat  Cognitive: Oriented  Insight: Limited  Engagement in Therapy: Engaged  Modes of Intervention: Discussion and Socialization  Summary of Progress/Problems: Shanon Brow from the [place] was here to tell his story of recovery and play his guitar. Pt stayed the entire time and was pleasant. Engaged in the discussion when appropriate.  Kara Mead. Marshell Levan 12/07/2015 2:09 PM

## 2015-12-07 NOTE — Progress Notes (Signed)
Adult Psychoeducational Group Note  Date:  12/07/2015 Time:  9:43 PM  Group Topic/Focus:  Wrap-Up Group:   The focus of this group is to help patients review their daily goal of treatment and discuss progress on daily workbooks.  Participation Level:  Active  Participation Quality:  Intrusive  Affect:  Labile  Cognitive:  Lacking  Insight: None  Engagement in Group:  Distracting  Modes of Intervention:  Socialization and Support  Additional Comments:  Patient attended group, however, was very distracting. He had to be redirected several time. He did report that he had a little trouble in the cafeteria. When he came back to the unit he regrouped.  Salley Scarlet [facility] 12/07/2015, 9:43 PM

## 2015-12-08 MED ORDER — PALIPERIDONE PALMITATE 156 MG/ML IM SUSP: 156.0000 mg | INTRAMUSCULAR | Status: DC

## 2015-12-08 MED ORDER — PALIPERIDONE PALMITATE 156 MG/ML IM SUSP
156.0000 mg | Freq: Once | INTRAMUSCULAR | Status: AC
  Administered 2015-12-12: 156 mg via INTRAMUSCULAR

## 2015-12-08 NOTE — BHH Group Notes (Signed)
[place] Group Notes:  (Nursing/MHT/Case Management/Adjunct)  Date:  12/08/2015  Time: 0930  Type of Therapy:  Nurse Education  Participation Level:  Active  Participation Quality:  Intrusive  Affect:  Labile and Not Congruent  Cognitive:  Disorganized  Insight:  Limited  Engagement in Group:  Distracting  Modes of Intervention:  Activity, Clarification and Socialization  Summary of Progress/Problems:  Forrestine Him 12/08/2015, 10:46 AM

## 2015-12-08 NOTE — BHH Group Notes (Signed)
[place] Group Notes:  (Counselor/Nursing/MHT/Case Management/Adjunct)  12/08/2015 1:15PM  Type of Therapy:  Group Therapy  Participation Level:  Active  Participation Quality:  Appropriate  Affect:  Flat  Cognitive:  Oriented  Insight:  Improving  Engagement in Group:  Limited  Engagement in Therapy:  Limited  Modes of Intervention:  Discussion, Exploration and Socialization  Summary of Progress/Problems: The topic for group was balance in life.  Pt participated in the discussion about when their life was in balance and out of balance and how this feels.  Pt discussed ways to get back in balance and short term goals they can work on to get where they want to be.  Had a difficult time staying on track.  Would blurt out non sequitors periodically.  "When do we get out flu shots?"  "What about the fence along [place]?"   Roque Lias B 12/08/2015 1:48 PM

## 2015-12-08 NOTE — Progress Notes (Signed)
Adult Psychoeducational Group Note  Date:  12/08/2015 Time:  8:48 PM  Group Topic/Focus:  Wrap-Up Group:   The focus of this group is to help patients review their daily goal of treatment and discuss progress on daily workbooks.  Participation Level:  Active  Participation Quality:  Intrusive, Inattentive and Redirectable  Affect:  Flat and Labile  Cognitive:  Disorganized  Insight: None  Engagement in Group:  Distracting, Lacking and Limited  Modes of Intervention:  Discussion  Additional Comments:  Pt stated that he had a pretty good day. He wants to get on a schedule while here. He does not have a goal for tomorrow.  Sharmon Revere 12/08/2015, 8:48 PM

## 2015-12-08 NOTE — Progress Notes (Signed)
[facility] MD Progress Note  12/08/2015 2:18 PM David [name]  MRN:  [id number] Subjective: Patient states " I am fine , sweet darling.'     Objective: Patient is a 43 y old CM , who has a hx of schizophrenia , who presented to [place]- for hearing AH as well as asking to go to [facility]. Patient seen and chart reviewed today .Discussed patient with treatment team.   Pt today seen as less irritable than previous days , is more cooperative during evaluation. Pt however continues to have periods when he appears to be talking about irrelevant stuff as well as has tangential thought process. Pt is alert, O X3. Pt continues to be focussed on getting discharged from the unit - stating he has things to take care of and that he will return soon. Pt lacks insight in to his illness and continues to need encouragement to take his medications and participate in milieu.     Pt is a very limited historian- and hence collateral information was obtained from ACTT per CSW- " Per ACTT- Pt has a chronic hx of schizophrenia , was at [facility] in April 2016. Pt was doing well for a while , but then started decompensating. Pt was noncompliant on his PO medications - but he is due for his Haldol decanoate IM 12/16.        Principal Problem: Paranoid schizophrenia ([place]) Diagnosis:   Patient Active Problem List   Diagnosis Date Noted  . 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 History of idiopathic thrombocytopenic purpura [Z86.2] 12/05/2015  . Paranoid schizophrenia ([place]) [F20.0] 12/03/2015  . HTN (hypertension) [I10] 03/04/2012  . GERD (gastroesophageal reflux disease) [K21.9] 03/04/2012  . Insomnia [G47.00] 03/04/2012  . CKD (chronic kidney disease) stage 2, GFR 60-89 ml/min [N18.2] 03/04/2012  . Schizophrenia, paranoid, chronic ([place]) [F20.0] 02/18/2012   Total Time spent with patient: 25 minutes  Past Psychiatric History: Pt was admitted to [facility] - schizophrenia - in 2013, was discharged on Haldol decanoate IM -  and was referred to [facility].Pt also had recent hospitalization at [facility] - April 2016. Pt has an ACTT.  Past Medical History:  Past Medical History  Diagnosis Date  . Schizophrenia ([place])   . Pneumonia   . Anxiety   . GERD (gastroesophageal reflux disease)   . COPD (chronic obstructive pulmonary disease) [facility])     Past Surgical History  Procedure Laterality Date  . Tonsillectomy     Family History:  Family History  Problem Relation Age of Onset  . Arthritis Mother   . Hypertension Mother    Family Psychiatric  History: Pt did not express any hx of mental illness in family. Social History: Pt lives by self in [place], [place]. Pt is single. History  Alcohol Use No     History  Drug Use No    Comment: denies    Social History   Social History  . Marital Status: Legally Separated    Spouse Name: N/A  . Number of Children: N/A  . Years of Education: N/A   Social History Main Topics  . Smoking status: Current Every Day Smoker -- 1.00 packs/day for 40 years    Types: Cigarettes    Start date: 04/14/1974  . Smokeless tobacco: Never Used  . Alcohol Use: No  . Drug Use: No     Comment: denies  . Sexual Activity: No   Other Topics Concern  . None   Social History Narrative   Additional Social History:  Sleep: Fair  Appetite:  Fair  Current Medications: Current Facility-Administered Medications  Medication Dose Route Frequency Provider Last Rate Last Dose  . acetaminophen (TYLENOL) tablet 650 mg  650 mg Oral Q6H PRN Derrill Center, NP   650 mg at 12/08/15 0919  . albuterol (PROVENTIL HFA;VENTOLIN HFA) 108 (90 BASE) MCG/ACT inhaler 2 puff  2 puff Inhalation Q6H PRN Derrill Center, NP   2 puff at 12/07/15 2127  . alum & mag hydroxide-simeth (MAALOX/MYLANTA) 200-200-20 MG/5ML suspension 30 mL  30 mL Oral Q4H PRN Derrill Center, NP   30 mL at 12/04/15 0225  . benztropine (COGENTIN) tablet 0.5 mg  0.5 mg Oral QHS Derrill Center, NP   0.5 mg  at 12/07/15 2123  . feeding supplement (ENSURE ENLIVE) (ENSURE ENLIVE) liquid 237 mL  237 mL Oral BID BM Jonny Dearden, MD   237 mL at 12/08/15 1302  . gabapentin (NEURONTIN) capsule 300 mg  300 mg Oral BID Derrill Center, NP   300 mg at 12/08/15 0750  . lidocaine (LIDODERM) 5 % 1 patch  1 patch Transdermal Q24H [name], MD   1 patch at 12/08/15 1103  . LORazepam (ATIVAN) tablet 1 mg  1 mg Oral BID PRN [name], MD   1 mg at 12/07/15 2123  . losartan (COZAAR) tablet 50 mg  50 mg Oral Daily Derrill Center, NP   50 mg at 12/08/15 0750  . magnesium hydroxide (MILK OF MAGNESIA) suspension 30 mL  30 mL Oral Daily PRN Derrill Center, NP   30 mL at 12/05/15 0239  . mirtazapine (REMERON) tablet 15 mg  15 mg Oral QHS PRN Derrill Center, NP   15 mg at 12/07/15 2123  . nicotine polacrilex (NICORETTE) gum 2 mg  2 mg Oral PRN [name], MD   2 mg at 12/07/15 1137  . OLANZapine (ZYPREXA) tablet 10 mg  10 mg Oral TID PRN [name], MD   10 mg at 12/07/15 2226   Or  . OLANZapine (ZYPREXA) injection 10 mg  10 mg Intramuscular TID PRN [name], MD      . Derrill Memo ON 12/12/2015] paliperidone (INVEGA SUSTENNA) injection 156 mg  156 mg Intramuscular Once Maily Debarge, MD      . paliperidone (INVEGA) 24 hr tablet 9 mg  9 mg Oral QHS [name], MD   9 mg at 12/07/15 2123  . pantoprazole (PROTONIX) EC tablet 40 mg  40 mg Oral Daily Derrill Center, NP   40 mg at 12/08/15 [id number]    Lab Results:  Results for orders placed or performed during the hospital encounter of 12/03/15 (from the past 48 hour(s))  Vitamin B12     Status: None   Collection Time: 12/06/15  6:24 PM  Result Value Ref Range   Vitamin B-12 630 180 - 914 pg/mL    Comment: (NOTE) This assay is not validated for testing neonatal or myeloproliferative syndrome specimens for Vitamin B12 levels. Performed at [facility]   Folate     Status: None   Collection Time: 12/06/15  6:24 PM  Result Value Ref Range    Folate 24.6 >5.9 ng/mL    Comment: Performed at [facility]    Physical Findings: AIMS: Facial and Oral Movements Muscles of Facial Expression: None, normal Lips and Perioral Area: None, normal Jaw: None, normal Tongue: None, normal,Extremity Movements Upper (arms, wrists, hands, fingers): None, normal Lower (legs, knees, ankles, toes): None, normal,  Trunk Movements Neck, shoulders, hips: None, normal, Overall Severity Severity of abnormal movements (highest score from questions above): None, normal Incapacitation due to abnormal movements: None, normal Patient's awareness of abnormal movements (rate only patient's report): No Awareness, Dental Status Current problems with teeth and/or dentures?: No Does patient usually wear dentures?: No  CIWA:  CIWA-Ar Total: 10 COWS:  COWS Total Score: 8  Musculoskeletal: Strength & Muscle Tone: within normal limits Gait & Station: normal Patient leans: N/A  Psychiatric Specialty Exam: Review of Systems  Psychiatric/Behavioral: The patient is nervous/anxious.   All other systems reviewed and are negative.   Blood pressure 135/79, pulse 106, temperature 97.9 F (36.6 C), temperature source Oral, resp. rate 17, height 5\' 10"  (1.778 m), weight 86.183 kg (190 lb).Body mass index is 27.26 kg/(m^2).  General Appearance: Disheveled  Eye Contact::  Good staring  Speech:  Normal Rate  Volume:  varies   Mood:  Anxious , is less irritable  Affect:  Labile  Thought Process:  Disorganized and Loose with some improvement  Orientation:  Full (Time, Place, and Person)  Thought Content:  Delusions, Paranoid Ideation and Rumination   Suicidal Thoughts:  No  Homicidal Thoughts:  No  Memory:  Immediate;   Fair Recent;   Poor Remote;   Poor  Judgement:  Impaired  Insight:  Shallow  Psychomotor Activity:  Restlessness  Concentration:  Poor  Recall:  Poor  Fund of Knowledge:Poor  Language: Fair  Akathisia:  No  Handed:  Right  AIMS (if  indicated):     Assets:  Others:  access to health care  ADL's:  Impaired  Cognition: WNL   Sleep:  Number of Hours: 6.25   Treatment Plan Summary:Patient presented disorganized ,delusional. Pt also has labile mood, requires a lot of redirection on the unit. Will continue medication readjustment.  Daily contact with patient to assess and evaluate symptoms and progress in treatment and Medication management   Will continue Invega to 9 mg po qhs.  Invega sustenna IM 234 mg x 1 dose given today 12/08/15. Next dose of Invega sustenna 156 mg IM  to be given in 4-7 days. Will not restart Haldol at this time. Will continue Cogentin 0.5 mg po qhs for EPS. Will continue Gabapentin  300 mg po bid for anxiety sx. Will continue Remeron 15 mg po qhs for affective sx as well as sleep.  Will continue home medications as indicated. Reviewed past medical records,treatment plan.   Will continue to monitor vitals ,medication compliance and treatment side effects while patient is here.  Will monitor for medical issues as well as call consult as needed.   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 Pt also has a hx of CKD- Creatinine chronically elevated.  TSH - wnl ,lipid panel- wnl ,  hba1c- wnl , PL is elevated - likely 2/2 neuroleptics- will monitor - will consider adding Abilify. Vistamin b12- wnl, folate - wnl.  CSW will start working on disposition. Pt to be referred back to ACTT - once stable. Patient to participate in therapeutic milieu .       Janeli Lewison MD 12/08/2015, 2:18 PM

## 2015-12-08 NOTE — Progress Notes (Signed)
D:Per patient self inventory form pt reports he slept good last night with the use of medication. He reports a fair appetite, normal energy level, good concentration. He rates depresison 3/10, hopelesness 3/10, anxiety 3/10- all on 0-10 scale, 10 being the worse. Pt denies SI/HI, C/O back pain. Intrusive. Loose associations, tangential. No insight in regards to his behavior. Psychotic Pt reports his goal is "working with the [employer] [facility]." Labile.  A:Special checks q 15 mins in place for safety. Medication administered per MD order(see eMAR) Encouragement and support provided.  R:Safety maintained. Compliant with medication regimen. Will continue to monitor.

## 2015-12-08 NOTE — Tx Team (Signed)
Interdisciplinary Treatment Plan Update (Adult)  Date:  12/08/2015   Time Reviewed:  8:43 AM   Progress in Treatment: Attending groups: Yes. Participating in groups:  Yes. Taking medication as prescribed:  Yes. Tolerating medication:  Yes. Family/Significant other contact made: Yes  ACT team  Patient understands diagnosis:  No  Limited insight Discussing patient identified problems/goals with staff:  Yes, see initial care plan. Medical problems stabilized or resolved:  Yes. Denies suicidal/homicidal ideation: Yes. Issues/concerns per patient self-inventory:  No. Other:  New problem(s) identified:  Discharge Plan or Barriers: see below  Reason for Continuation of Hospitalization: Medication stabilization Disorganization, Flight of ideas  Comments:  Patient presents disorganized , has limited insight, is a poor historian. Collateral information was obtained from ACTT. Will continue medication readjustment. Will cross taper Zyprexa with Invega . Plan to DC pt on Invega sustenna IM - if he tolerates it well. Will not restart Haldol at this time. Will continue Cogentin 0.5 mg po qhs for EPS. Will continue Gabapentin 300 mg po bid for anxiety sx. Will continue Remeron 15 mg po qhs for affective sx as well as sleep.  12/08/15: Patient continues to present as disorganized ,delusional. Pt also has labile mood, requires a lot of redirection on the unit. Will continue medication readjustment.  Will increase Invega to 9 mg po qhs. Plan to DC pt on Invega sustenna IM - if he tolerates it well. Will provide Invega sustenna 234 mg IM tomorrow. Will not restart Haldol at this time. Will continue Cogentin 0.5 mg po qhs for EPS. Will continue Gabapentin 300 mg po bid for anxiety sx. Will continue Remeron 15 mg po qhs for affective sx as well as sleep.  Estimated length of stay: 4-5 days  New goal(s):  Review of initial/current patient goals per problem list:   Review of initial/current  patient goals per problem list:  1. Goal(s): Patient will participate in aftercare plan   Met: Yes   Target date: 3-5 days post admission date   As evidenced by: Patient will participate within aftercare plan AEB aftercare provider and housing plan at discharge being identified.  12/06/15: Return home, follow up outpt    5. Goal(s): Patient will demonstrate decreased signs of psychosis  * Met: No  * Target date: 3-5 days post admission date  * As evidenced by: Patient will demonstrate decreased frequency of AVH or return to baseline function 12/06/15:  Bizarre, disorganized, poor historian 12/08/15:  Speech is now clearer, intelligible, and pt is more goal directed.  However, remains disorganized, lacking insight and displaying mood lability.  Upset tha he cannot go home on pass to get his shoes           Attendees: Patient:  12/08/2015 8:43 AM   Family:   12/08/2015 8:43 AM   Physician:  [name], MD 12/08/2015 8:43 AM   Nursing:   Manuella Ghazi, RN 12/08/2015 8:43 AM   CSW:    Roque Lias, LCSW   12/08/2015 8:43 AM   Other:  12/08/2015 8:43 AM   Other:   12/08/2015 8:43 AM   Other:  Lars Pinks, Nurse CM 12/08/2015 8:43 AM   Other:   12/08/2015 8:43 AM   Other:  Norberto Sorenson, [place]  12/08/2015 8:43 AM   Other:  12/08/2015 8:43 AM   Other:  12/08/2015 8:43 AM   Other:  12/08/2015 8:43 AM   Other:  12/08/2015 8:43 AM   Other:  12/08/2015 8:43 AM   Other:   12/08/2015  8:43 AM    Scribe for Treatment Team:   Trish Mage, 12/08/2015 8:43 AM

## 2015-12-08 NOTE — Progress Notes (Signed)
Pt mother on unit to visit. Per patient verbal and written consent pt mother picked up pt's personal belongings out of locker. This nurse and Lyda Jester present. Will continue to monitor.

## 2015-12-08 NOTE — Progress Notes (Signed)
D: Patient alert and oriented x 4. Patient denies SI/HI/AVH. Patient denied pain at the time of assessment but at 2226 complained of a headache and requested tylenol. PRN dose was given. During reassessment patient was asleep. Patient was getting agitated stating he needed to get the police to take him home so he could get some belongings and come back, patient then called 911 asking for transportation. This Probation officer redirected patient and advised it was late and right now would not be the best time to try to leave and he needs to speak with the doctor about his discharge date.  A: Staff to monitor Q 15 mins for safety. Encouragement and support offered. Scheduled medications administered per orders. R: Patient remains safe on the unit. Patient attended group tonight. Patient visible on hte unit and interacting with peers. Patient taking administered medications.

## 2015-12-09 MED ORDER — GUAIFENESIN 100 MG/5ML PO SOLN
5.0000 mL | Freq: Three times a day (TID) | ORAL | Status: AC
  Administered 2015-12-09 (×3): 100 mg via ORAL
  Administered 2015-12-10: 5 mg via ORAL
  Administered 2015-12-10 – 2015-12-11 (×6): 100 mg via ORAL

## 2015-12-09 NOTE — Progress Notes (Signed)
David Monte' ( as Kasi asks to be called) has had a good day today. He started the day off getting to trim and shave his beard / mustache/ scalp and he was happy to get that done. He is compliant with his meds and he completed hisd aily assessment and on it he wrote  He deneid SI and he rated his depression, hopelessness and anxiety " 0/0/0", respectively.   A He cont to have mood lability, tangential, disorganized thinking.    R Safety is in place and poc cont.

## 2015-12-09 NOTE — BHH Counselor (Signed)
Adult Comprehensive Assessment  Patient ID: David [name], male   DOB: 1956/10/15, 59 y.o.   MRN: [id number]  Information Source: Information source: Patient (with some collateral from mother)  Current Stressors:  Employment / Job issues: Disability Family Relationships: Good with both parents Financial / Lack of resources (include bankruptcy): Fixed income Housing / Lack of housing: Able to pay the bills at the house he is living in-it's his grandparents home who are both deceased.  Living/Environment/Situation:  Living Arrangements: Alone Living conditions (as described by patient or guardian): good How long has patient lived in current situation?: about 10 years, since grandparents died  [place] What is atmosphere in current home: Comfortable  Family History:  Marital status: Divorced Divorced, when?: "long time ago, after being married for 10 years" Are you sexually active?: No Does patient have children?: No  Childhood History:  By whom was/is the patient raised?: Mother Additional childhood history information: Parents split when he was young.  Mother remarried and he was raised by she and step father. Description of patient's relationship with caregiver when they were a child: Good Patient's description of current relationship with people who raised him/her: Good Does patient have siblings?: Yes Number of Siblings: 2 Description of patient's current relationship with siblings: 50 brothers with whom he gets along Did patient suffer any verbal/emotional/physical/sexual abuse as a child?: No Did patient suffer from severe childhood neglect?: No Has patient ever been sexually abused/assaulted/raped as an adolescent or adult?: No Was the patient ever a victim of a crime or a disaster?: No Witnessed domestic violence?: No Has patient been effected by domestic violence as an adult?: No  Education:  Highest grade of school patient has completed: 12 plus  years, plus certification in pipefitting Currently a student?: No Learning disability?: No  Employment/Work Situation:   Employment situation: On disability Why is patient on disability: Mental illness How long has patient been on disability: unable to say Patient's job has been impacted by current illness: No What is the longest time patient has a held a job?: 5 years, some of it in [place], [place] 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 Has patient ever been in the [employer]?: No Has patient ever served in Recruitment consultant?: No Are There Guns or Other Weapons in [place]?: No  Financial Resources:   Museum/gallery curator resources: Receives SSI Does patient have a Programmer, applications or guardian?: No  Alcohol/Substance Abuse:   What has been your use of drugs/alcohol within the last 12 months?: History of extensive drug use/abuse Has alcohol/substance abuse ever caused legal problems?: Yes (Past drug charges)  Social Support System:   Patient's Community Support System: Good Describe Community Support System: family Type of faith/religion: N/A How does patient's faith help to cope with current illness?: N/A  Leisure/Recreation:   Leisure and Hobbies: "I mostly stay to myself"  Strengths/Needs:   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  He spent 15 months at [facility] last year and earlier this  year.  He returned home with  the support of the [facility] ACT team,  and did well until recently when the effects of not taking his PO meds  began to  take effect.  He can benefit from crises stabilization,medicaiton management, therapeutic milieu and coordination with his ACT team.  Roque Lias B. 12/09/2015

## 2015-12-09 NOTE — Progress Notes (Addendum)
[facility] MD Progress Note  12/09/2015 12:34 PM David [name]  MRN:  [id number] Subjective: Patient states " I am fine , I can take care of myself , I am not a child, I am the 'King'.     Objective: Patient is a 89 y old CM , who has a hx of schizophrenia , who presented to [facility]- for hearing [place] as well as asking to go to [facility]. Patient seen and chart reviewed today .Discussed patient with treatment team.   Pt today seen as delusional , however is more organized than on admission. Pt continues to have VH - states he sees stuff that cannot be quiet explained. Pt has been tolerating his medications well- he received his Invega sustenna IM yesterday - AIMS - 0 . Denies any side effects. Pt denies any SI/HI. Per staff- pt continues to have periods when he is intrusive , inappropriate on the unit.     Pt is a very limited historian- and hence collateral information was obtained from ACTT per CSW- " Per ACTT- Pt has a chronic hx of schizophrenia , was at [facility] recently - stayed there for 15 month and was very depressed, ?catatonic during that admission . Pt was doing well for a while after discharge  , but then started decompensating. Pt was noncompliant on his PO medications - was discharged on Haldol decanoate IM.       Principal Problem: Paranoid schizophrenia ([place]) Diagnosis:   Patient Active Problem List   Diagnosis Date Noted  . 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 History of idiopathic thrombocytopenic purpura [Z86.2] 12/05/2015  . Paranoid schizophrenia ([place]) [F20.0] 12/03/2015  . HTN (hypertension) [I10] 03/04/2012  . GERD (gastroesophageal reflux disease) [K21.9] 03/04/2012  . Insomnia [G47.00] 03/04/2012  . CKD (chronic kidney disease) stage 2, GFR 60-89 ml/min [N18.2] 03/04/2012  . Schizophrenia, paranoid, chronic ([place]) [F20.0] 02/18/2012   Total Time spent with patient: 25 minutes  Past Psychiatric History: Pt was admitted to [facility] - schizophrenia - in 2013, was  discharged on Haldol decanoate IM - and was referred to [facility].Pt also had recent hospitalization at [facility] - April 2016. Pt has an ACTT.  Past Medical History:  Past Medical History  Diagnosis Date  . Schizophrenia ([place])   . Pneumonia   . Anxiety   . GERD (gastroesophageal reflux disease)   . COPD (chronic obstructive pulmonary disease) [facility])     Past Surgical History  Procedure Laterality Date  . Tonsillectomy     Family History:  Family History  Problem Relation Age of Onset  . Arthritis Mother   . Hypertension Mother    Family Psychiatric  History: Pt did not express any hx of mental illness in family. Social History: Pt lives by self in [place], [place]. Pt is single. History  Alcohol Use No     History  Drug Use No    Comment: denies    Social History   Social History  . Marital Status: Legally Separated    Spouse Name: N/A  . Number of Children: N/A  . Years of Education: N/A   Social History Main Topics  . Smoking status: Current Every Day Smoker -- 1.00 packs/day for 40 years    Types: Cigarettes    Start date: 04/14/1974  . Smokeless tobacco: Never Used  . Alcohol Use: No  . Drug Use: No     Comment: denies  . Sexual Activity: No   Other Topics Concern  . None   Social History Narrative  Additional Social History:                         Sleep: Fair  Appetite:  Fair  Current Medications: Current Facility-Administered Medications  Medication Dose Route Frequency Provider Last Rate Last Dose  . acetaminophen (TYLENOL) tablet 650 mg  650 mg Oral Q6H PRN Derrill Center, NP   650 mg at 12/08/15 1654  . albuterol (PROVENTIL HFA;VENTOLIN HFA) 108 (90 BASE) MCG/ACT inhaler 2 puff  2 puff Inhalation Q6H PRN Derrill Center, NP   2 puff at 12/09/15 0829  . alum & mag hydroxide-simeth (MAALOX/MYLANTA) 200-200-20 MG/5ML suspension 30 mL  30 mL Oral Q4H PRN Derrill Center, NP   30 mL at 12/04/15 0225  . benztropine (COGENTIN) tablet 0.5 mg  0.5 mg  Oral QHS Derrill Center, NP   0.5 mg at 12/08/15 2132  . feeding supplement (ENSURE ENLIVE) (ENSURE ENLIVE) liquid 237 mL  237 mL Oral BID BM Domenique Southers, MD   237 mL at 12/09/15 1000  . gabapentin (NEURONTIN) capsule 300 mg  300 mg Oral BID Derrill Center, NP   300 mg at 12/09/15 0829  . guaiFENesin (ROBITUSSIN) 100 MG/5ML solution 100 mg  5 mL Oral TID AC & HS Orbin Mayeux, MD      . lidocaine (LIDODERM) 5 % 1 patch  1 patch Transdermal Q24H [name], MD   1 patch at 12/09/15 1203  . LORazepam (ATIVAN) tablet 1 mg  1 mg Oral BID PRN [name], MD   1 mg at 12/09/15 0830  . losartan (COZAAR) tablet 50 mg  50 mg Oral Daily Derrill Center, NP   50 mg at 12/09/15 0829  . magnesium hydroxide (MILK OF MAGNESIA) suspension 30 mL  30 mL Oral Daily PRN Derrill Center, NP   30 mL at 12/05/15 0239  . mirtazapine (REMERON) tablet 15 mg  15 mg Oral QHS PRN Derrill Center, NP   15 mg at 12/08/15 2132  . nicotine polacrilex (NICORETTE) gum 2 mg  2 mg Oral PRN [name], MD   2 mg at 12/09/15 0044  . OLANZapine (ZYPREXA) tablet 10 mg  10 mg Oral TID PRN [name], MD   10 mg at 12/08/15 2244   Or  . OLANZapine (ZYPREXA) injection 10 mg  10 mg Intramuscular TID PRN [name], MD      . Derrill Memo ON 12/12/2015] paliperidone (INVEGA SUSTENNA) injection 156 mg  156 mg Intramuscular Once [name], MD      . Derrill Memo ON 01/10/2016] paliperidone (INVEGA SUSTENNA) injection 156 mg  156 mg Intramuscular Q28 days Maylin Freeburg, MD      . paliperidone (INVEGA) 24 hr tablet 9 mg  9 mg Oral QHS [name], MD   9 mg at 12/08/15 2132  . pantoprazole (PROTONIX) EC tablet 40 mg  40 mg Oral Daily Derrill Center, NP   40 mg at 12/09/15 [id number]    Lab Results:  No results found for this or any previous visit (from the past 48 hour(s)).  Physical Findings: AIMS: Facial and Oral Movements Muscles of Facial Expression: None, normal Lips and Perioral Area: None, normal Jaw: None,  normal Tongue: None, normal,Extremity Movements Upper (arms, wrists, hands, fingers): None, normal Lower (legs, knees, ankles, toes): None, normal, Trunk Movements Neck, shoulders, hips: None, normal, Overall Severity Severity of abnormal movements (highest score from questions above): None, normal Incapacitation due to abnormal  movements: None, normal Patient's awareness of abnormal movements (rate only patient's report): No Awareness, Dental Status Current problems with teeth and/or dentures?: No Does patient usually wear dentures?: No  CIWA:  CIWA-Ar Total: 10 COWS:  COWS Total Score: 8  Musculoskeletal: Strength & Muscle Tone: within normal limits Gait & Station: normal Patient leans: N/A  Psychiatric Specialty Exam: Review of Systems  Psychiatric/Behavioral: Positive for hallucinations. The patient is nervous/anxious.   All other systems reviewed and are negative.   Blood pressure 134/80, pulse 112, temperature 98.4 F (36.9 C), temperature source Oral, resp. rate 16, height 5\' 10"  (1.778 m), weight 86.183 kg (190 lb).Body mass index is 27.26 kg/(m^2).  General Appearance: Disheveled  Eye Contact::  Good staring  Speech:  Normal Rate  Volume:  varies   Mood:  Anxious , is less irritable  Affect:  Labile  Thought Process:  Disorganized and Loose with some improvement  Orientation:  Full (Time, Place, and Person)  Thought Content:  Delusions, Hallucinations: Visual, Paranoid Ideation and Rumination With improvement    Suicidal Thoughts:  No  Homicidal Thoughts:  No  Memory:  Immediate;   Fair Recent;   Poor Remote;   Poor  Judgement:  Impaired  Insight:  Shallow  Psychomotor Activity:  Restlessness  Concentration:  Poor  Recall:  Poor  Fund of Knowledge:Poor  Language: Fair  Akathisia:  No  Handed:  Right  AIMS (if indicated):   0  Assets:  Others:  access to health care  ADL's:  Impaired  Cognition: WNL   Sleep:  Number of Hours: 5.5   Treatment Plan  Summary:Patient presented disorganized ,delusional.Pt with some improvement , is more organized than on admission. Will continue medication readjustment.  Daily contact with patient to assess and evaluate symptoms and progress in treatment and Medication management   Will continue Invega  9 mg po qhs.  Invega sustenna IM 234 mg x 1 dose given 12/08/15. Next dose of Invega sustenna 156 mg IM  to be given in 4-7 days. AIMS - 0 ( 12/09/15) Will not restart Haldol at this time. Will continue Cogentin 0.5 mg po qhs for EPS. Will continue Gabapentin  300 mg po bid for anxiety sx. Will continue Remeron 15 mg po qhs for affective sx as well as sleep.  Will continue home medications as indicated. Reviewed past medical records,treatment plan.   Will continue to monitor vitals ,medication compliance and treatment side effects while patient is here.  Will monitor for medical issues as well as call consult as needed.   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 Pt also has a hx of CKD- Creatinine chronically elevated.  TSH - wnl ,lipid panel- wnl ,  hba1c- wnl , PL is elevated - likely 2/2 neuroleptics- will monitor - will consider adding Abilify. Vistamin b12- wnl, folate - wnl.  CSW will start working on disposition. Pt to be referred back to ACTT - once stable. Patient to participate in therapeutic milieu .       Sheng Pritz MD 12/09/2015, 12:34 PM

## 2015-12-09 NOTE — BHH Group Notes (Addendum)
[place] LCSW Group Therapy  12/09/2015  1:05 PM  Type of Therapy:  Group therapy  Participation Level:  Active  Participation Quality:  Attentive  Affect:  Flat  Cognitive:  Oriented  Insight:  Limited  Engagement in Therapy:  Limited  Modes of Intervention:  Discussion, Socialization  Summary of Progress/Problems:  Chaplain was here to lead a group on themes of hope and courage. Was in group initially.  Left after 8 minutes and did not return.  Roque Lias B 12/09/2015 2:25 PM

## 2015-12-09 NOTE — Progress Notes (Signed)
Adult Psychoeducational Group Note  Date:  12/09/2015 Time:  9:43 PM  Group Topic/Focus:  Wrap-Up Group:   The focus of this group is to help patients review their daily goal of treatment and discuss progress on daily workbooks.  Participation Level:  Active  Participation Quality:  Inattentive  Affect:  Not Congruent  Cognitive:  Disorganized and Delusional  Insight: Limited  Engagement in Group:  Off Topic  Modes of Intervention:  Activity  Additional Comments:  Patient rated his day a 1, expressed he had a good day. Does not have a goal.  David [name] 12/09/2015, 9:43 PM

## 2015-12-09 NOTE — BHH Group Notes (Signed)
[facility] LCSW Aftercare Discharge Planning Group Note   12/09/2015 10:04 AM  Participation Quality: Invited. Chose not to attend.   David [name]

## 2015-12-09 NOTE — Progress Notes (Signed)
D: Patient alert and oriented x 4. Patient denies pain/SI/HI/AVH. Patient was very intrusive and had to be redirected during shift. Patient would state around other patient he was wanting to have sex. During assessment patient wanting to talk about circumcisions and how doctors should wait until baby boys are a month old to preform. This Probation officer again redirected patient that conversation was not appropriate but I would let the doctor know. Patient stated during shift multiple times he was wanting the sheriff to take him home and then would come back. This Probation officer and other staff advised patient that he would have to speak with his doctor about discharge. Patient refused to let this writer remove the lidocaine patch that is on patient's back.  A: Staff to monitor Q 15 mins for safety. Encouragement and support offered. Scheduled medications administered per orders. R: Patient remains safe on the unit. Patient attended group tonight. Patient visible on hte unit and interacting with peers. Patient taking administered medications.

## 2015-12-09 NOTE — BHH Group Notes (Signed)
[place] LCSW Group Therapy   12/09/2015 1:20 PM  Type of Therapy: Group Therapy  Participation Level:  Active  Participation Quality:  Attentive  Affect:  Flat  Cognitive:  Oriented  Insight:  Limited  Engagement in Therapy:  Engaged  Modes of Intervention:  Discussion and Socialization  Summary of Progress/Problems: Chaplain was here to lead a group on themes of hope and/or courage.  "Hope is a better tomorrow". Pt explained that this is the first definition that came to mind and that it seemed like a good idea to share it with others. Pt later dismissed himself from group because he said he was feeling tired. Did not return.  Georga Kaufmann 12/09/2015 1:20 PM

## 2015-12-10 NOTE — Progress Notes (Signed)
D Monte" ( as he asks staff to call him) is assisted to wash and dry his dirty clothes. Again , this writer changed pt's  bed linens and made his bed for him, per his request.  Pt completed his daily assessment and on it he wrote he denied SI today and he rated his depression, hopelessness and anxiety " 3/2//2", respectively.    A  His behavior and his conversation is disorganized, loose and tangential. He is often sexually inappropriate with his conversation and sometimes resists staff redirection but reigns himself in when male employees are present.    R Safety in place.

## 2015-12-10 NOTE — Progress Notes (Signed)
[facility] MD Progress Note  12/10/2015 12:39 PM Curlie Koza  MRN:  [id number]   Subjective: Patient states "I don't have any problem. '.  Objective: Patient is a 35 y old CM , who has a hx of schizophrenia , who presented to [place]- for hearing [place] as well as asking to go to [facility].  Patient seen and chart reviewed today .patient remains very disorganized, delusional and psychotic.  He is very labile , intrusive and easily irritable.  Though he is taking his medication but requires constant redirection and verbal encouragement to take his medication.  He continued to endorse auditory hallucination, paranoia but he does not believe he has any psychiatric illness.  He denies any side effects of medication.  He is seen walking in the hallway and at times disruptive .  Though he denies any suicidal thoughts but remains inappropriate and disorganized.  He is a poor historian and most of the information was obtained from electronic medical record.  Pt has a chronic hx of schizophrenia , was at [facility] recently - stayed there for 15 month and was very depressed, catatonic during that admission . Pt was doing well for a while after discharge  , but then started decompensating. Pt was noncompliant on his PO medications - was discharged on Haldol decanoate IM.  Principal Problem: Paranoid schizophrenia ([place]) Diagnosis:   Patient Active Problem List   Diagnosis Date Noted  . 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 History of idiopathic thrombocytopenic purpura [Z86.2] 12/05/2015  . Paranoid schizophrenia ([place]) [F20.0] 12/03/2015  . HTN (hypertension) [I10] 03/04/2012  . GERD (gastroesophageal reflux disease) [K21.9] 03/04/2012  . Insomnia [G47.00] 03/04/2012  . CKD (chronic kidney disease) stage 2, GFR 60-89 ml/min [N18.2] 03/04/2012  . Schizophrenia, paranoid, chronic ([place]) [F20.0] 02/18/2012   Total Time spent with patient: 25 minutes  Past Psychiatric History: Pt was admitted to [facility] - schizophrenia - in  2013, was discharged on Haldol decanoate IM - and was referred to [facility].Pt also had recent hospitalization at [facility] - April 2016. Pt has an ACTT.  Past Medical History:  Past Medical History  Diagnosis Date  . Schizophrenia ([place])   . Pneumonia   . Anxiety   . GERD (gastroesophageal reflux disease)   . COPD (chronic obstructive pulmonary disease) [facility])     Past Surgical History  Procedure Laterality Date  . Tonsillectomy     Family History:  Family History  Problem Relation Age of Onset  . Arthritis Mother   . Hypertension Mother    Family Psychiatric  History: Pt did not express any hx of mental illness in family. Social History: Pt lives by self in [place], [place]. Pt is single. History  Alcohol Use No     History  Drug Use No    Comment: denies    Social History   Social History  . Marital Status: Legally Separated    Spouse Name: N/A  . Number of Children: N/A  . Years of Education: N/A   Social History Main Topics  . Smoking status: Current Every Day Smoker -- 1.00 packs/day for 40 years    Types: Cigarettes    Start date: 04/14/1974  . Smokeless tobacco: Never Used  . Alcohol Use: No  . Drug Use: No     Comment: denies  . Sexual Activity: No   Other Topics Concern  . None   Social History Narrative   Additional Social History:   Sleep: Fair  Appetite:  Fair  Current Medications: Current  Facility-Administered Medications  Medication Dose Route Frequency Provider Last Rate Last Dose  . acetaminophen (TYLENOL) tablet 650 mg  650 mg Oral Q6H PRN Derrill Center, NP   650 mg at 12/09/15 2118  . albuterol (PROVENTIL HFA;VENTOLIN HFA) 108 (90 BASE) MCG/ACT inhaler 2 puff  2 puff Inhalation Q6H PRN Derrill Center, NP   2 puff at 12/10/15 0758  . alum & mag hydroxide-simeth (MAALOX/MYLANTA) 200-200-20 MG/5ML suspension 30 mL  30 mL Oral Q4H PRN Derrill Center, NP   30 mL at 12/04/15 0225  . benztropine (COGENTIN) tablet 0.5 mg  0.5 mg Oral QHS Derrill Center, NP   0.5 mg at 12/09/15 2118  . feeding supplement (ENSURE ENLIVE) (ENSURE ENLIVE) liquid 237 mL  237 mL Oral BID BM Saramma Eappen, MD   237 mL at 12/10/15 0758  . gabapentin (NEURONTIN) capsule 300 mg  300 mg Oral BID Derrill Center, NP   300 mg at 12/10/15 0758  . guaiFENesin (ROBITUSSIN) 100 MG/5ML solution 100 mg  5 mL Oral TID AC & HS Saramma Eappen, MD   100 mg at 12/10/15 0636  . lidocaine (LIDODERM) 5 % 1 patch  1 patch Transdermal Q24H [name], MD   1 patch at 12/09/15 1203  . LORazepam (ATIVAN) tablet 1 mg  1 mg Oral BID PRN [name], MD   1 mg at 12/10/15 0801  . losartan (COZAAR) tablet 50 mg  50 mg Oral Daily Derrill Center, NP   50 mg at 12/10/15 0758  . magnesium hydroxide (MILK OF MAGNESIA) suspension 30 mL  30 mL Oral Daily PRN Derrill Center, NP   30 mL at 12/05/15 0239  . mirtazapine (REMERON) tablet 15 mg  15 mg Oral QHS PRN Derrill Center, NP   15 mg at 12/09/15 2118  . nicotine polacrilex (NICORETTE) gum 2 mg  2 mg Oral PRN [name], MD   2 mg at 12/10/15 0758  . OLANZapine (ZYPREXA) tablet 10 mg  10 mg Oral TID PRN [name], MD   10 mg at 12/09/15 2118   Or  . OLANZapine (ZYPREXA) injection 10 mg  10 mg Intramuscular TID PRN [name], MD      . Derrill Memo ON 12/12/2015] paliperidone (INVEGA SUSTENNA) injection 156 mg  156 mg Intramuscular Once [name], MD      . Derrill Memo ON 01/10/2016] paliperidone (INVEGA SUSTENNA) injection 156 mg  156 mg Intramuscular Q28 days Saramma Eappen, MD      . paliperidone (INVEGA) 24 hr tablet 9 mg  9 mg Oral QHS [name], MD   9 mg at 12/09/15 2118  . pantoprazole (PROTONIX) EC tablet 40 mg  40 mg Oral Daily Derrill Center, NP   40 mg at 12/10/15 [id number]    Lab Results:  No results found for this or any previous visit (from the past 48 hour(s)).  Physical Findings: AIMS: Facial and Oral Movements Muscles of Facial Expression: None, normal Lips and Perioral Area: None, normal Jaw: None,  normal Tongue: None, normal,Extremity Movements Upper (arms, wrists, hands, fingers): None, normal Lower (legs, knees, ankles, toes): None, normal, Trunk Movements Neck, shoulders, hips: None, normal, Overall Severity Severity of abnormal movements (highest score from questions above): None, normal Incapacitation due to abnormal movements: None, normal Patient's awareness of abnormal movements (rate only patient's report): No Awareness, Dental Status Current problems with teeth and/or dentures?: No Does patient usually wear dentures?: No  CIWA:  CIWA-Ar Total:  10 COWS:  COWS Total Score: 8  Musculoskeletal: Strength & Muscle Tone: within normal limits Gait & Station: normal Patient leans: N/A  Psychiatric Specialty Exam: Review of Systems  Psychiatric/Behavioral: Positive for hallucinations. The patient is nervous/anxious.   All other systems reviewed and are negative.   Blood pressure 134/75, pulse 108, temperature 98.1 F (36.7 C), temperature source Oral, resp. rate 18, height 5\' 10"  (1.778 m), weight 86.183 kg (190 lb).Body mass index is 27.26 kg/(m^2).  General Appearance: Disheveled  Eye Contact::  Good staring  Speech:  Normal Rate  Volume:  varies   Mood:  Anxious , is less irritable  Affect:  Labile  Thought Process:  Disorganized and Loose with some improvement  Orientation:  Full (Time, Place, and Person)  Thought Content:  Delusions, Hallucinations: Visual, Paranoid Ideation and Rumination With improvement    Suicidal Thoughts:  No  Homicidal Thoughts:  No  Memory:  Immediate;   Fair Recent;   Poor Remote;   Poor  Judgement:  Impaired  Insight:  Shallow  Psychomotor Activity:  Restlessness  Concentration:  Poor  Recall:  Poor  Fund of Knowledge:Poor  Language: Fair  Akathisia:  No  Handed:  Right  AIMS (if indicated):   0  Assets:  Others:  access to health care  ADL's:  Impaired  Cognition: WNL   Sleep:  Number of Hours: 6   Treatment Plan  Summary:Patient presented disorganized ,delusional.Pt with some improvement , is more organized than on admission. Will continue medication readjustment.  Daily contact with patient to assess and evaluate symptoms and progress in treatment and Medication management   Will continue Invega  9 mg po qhs.  Invega sustenna IM 234 mg x 1 dose given 12/08/15. Next dose of Invega sustenna 156 mg IM  to be given in 4-7 days. AIMS - 0 ( 12/09/15) Will continue Cogentin 0.5 mg po qhs for EPS. Will continue Gabapentin  300 mg po bid for anxiety sx. Will continue Remeron 15 mg po qhs for affective sx as well as sleep. Will continue to monitor vitals ,medication compliance and treatment side effects while patient is here.  Will monitor for medical issues as well as call consult as needed.   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 Pt also has a hx of CKD- Creatinine chronically elevated.  TSH - wnl ,lipid panel- wnl ,  hba1c- wnl , PL is elevated - likely 2/2 neuroleptics- will monitor - will consider adding Abilify. Vistamin b12- wnl, folate - wnl.  CSW will start working on disposition. Pt to be referred back to ACTT - once stable. Patient to participate in therapeutic milieu .       Amilliana Hayworth T. MD 12/10/2015, 12:39 PM

## 2015-12-10 NOTE — Progress Notes (Signed)
Adult Psychoeducational Group Note  Date:  12/10/2015 Time:  8:30 PM  Group Topic/Focus:  Wrap-Up Group:   The focus of this group is to help patients review their daily goal of treatment and discuss progress on daily workbooks.  Participation Level:  Active  Participation Quality:  Appropriate  Affect:  Appropriate  Cognitive:  Appropriate  Insight: Appropriate  Engagement in Group:  Engaged  Modes of Intervention:  Discussion  Additional Comments:  The patient expressed that he attended group.The patient also said that he rates his day a 8 which is a good day.  Nash Shearer 12/10/2015, 8:30 PM

## 2015-12-10 NOTE — Progress Notes (Signed)
D: Patient alert and oriented x 4. Patient denies SI/HI/AVH. Patient states he is having some mid back pain 4/10. PRN dose tylenol was given with effective results. Patient continues to have inappropriate talks about sex and tried to kiss one of the MHT on staff. Patient was able to be redirected. Patient refused to let this nurse remove lidocaine patch from back. Patient kept asking for a patch and not a lidocaine patch to be applied to his back, after multiple times asking a heating patch was applied. PRN Remeron was given for insomnia with effective results. Zyprexa was given for agitation with effective results.   A: Staff to monitor Q 15 mins for safety. Encouragement and support offered. Scheduled medications administered per orders. R: Patient remains safe on the unit. Patient attended group tonight. Patient visible on hte unit and interacting with peers. Patient taking administered medications.

## 2015-12-10 NOTE — BHH Group Notes (Signed)
[place] Group Notes:  (Clinical Social Work)  12/10/2015  11:15-12:00PM  Summary of Progress/Problems:   Today's process group involved patients discussing their feelings related to being hospitalized, as well as how they want to feel in order to be ready to discharge.  It was agreed in general by the group that it would be preferable to avoid future hospitalizations, and there was a discussion about healthy coping techniques that will help with that goal and unhealthy coping techniques that will hinder it. The patient expressed nothing during group that was not delusional in nature and disorganized, completely off topic.  He kept talking about wanting to go to [facility] to have a cookout.  He started to insist several times on talking about politics, building a wall, having guns, and was cut off by CSW.  He started to discuss wildlife at one point as a healthy coping technique.  Type of Therapy:  Group Therapy - Process  Participation Level:  Active  Participation Quality:  Intrusive and Inattentive  Affect:  Flat  Cognitive:  Disorganized, Confused and Delusional  Insight:  Poor  Engagement in Therapy:  Poor  Modes of Intervention:  Exploration, Discussion  Selmer Dominion, LCSW 12/10/2015, 12:53 PM

## 2015-12-10 NOTE — Progress Notes (Signed)
Patient complained of cold symptoms of coughing and congestion. PRN Albuterol inhaler was used at [phone] with effective results. Scheduled robitussin also given at that time.

## 2015-12-11 NOTE — Progress Notes (Signed)
[facility] MD Progress Note  12/11/2015 2:17 PM David [name]  MRN:  [id number]   Subjective: Patient states "leave me alone'.  Objective: Patient is a 19 y old CM , who has a hx of schizophrenia , who presented to [place]- for hearing [place] as well as asking to go to [facility].  Patient seen and chart reviewed today .patient is still delusional, paranoid, labile and irritable.  He continued to endorse hallucination and easily intrusive.  However he is taking his medication as prescribed.  He like to go back [facility].  Though he denies any suicidal thoughts but remains inappropriate and disorganized.  His thought processes remains very disorganized and tangential.  He denies any side effects of medication. He is a poor historian and most of the information was obtained from electronic medical record.  Pt has a chronic hx of schizophrenia , was at [facility] recently - stayed there for 15 month and was very depressed, catatonic during that admission . Pt was doing well for a while after discharge  , but then started decompensating. Pt was noncompliant on his PO medications - was discharged on Haldol decanoate IM.  Principal Problem: Paranoid schizophrenia ([place]) Diagnosis:   Patient Active Problem List   Diagnosis Date Noted  . 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 History of idiopathic thrombocytopenic purpura [Z86.2] 12/05/2015  . Paranoid schizophrenia ([place]) [F20.0] 12/03/2015  . HTN (hypertension) [I10] 03/04/2012  . GERD (gastroesophageal reflux disease) [K21.9] 03/04/2012  . Insomnia [G47.00] 03/04/2012  . CKD (chronic kidney disease) stage 2, GFR 60-89 ml/min [N18.2] 03/04/2012  . Schizophrenia, paranoid, chronic ([place]) [F20.0] 02/18/2012   Total Time spent with patient: 25 minutes  Past Psychiatric History: Pt was admitted to [facility] - schizophrenia - in 2013, was discharged on Haldol decanoate IM - and was referred to [facility].Pt also had recent hospitalization at [facility] - April 2016. Pt has  an ACTT.  Past Medical History:  Past Medical History  Diagnosis Date  . Schizophrenia ([place])   . Pneumonia   . Anxiety   . GERD (gastroesophageal reflux disease)   . COPD (chronic obstructive pulmonary disease) [facility])     Past Surgical History  Procedure Laterality Date  . Tonsillectomy     Family History:  Family History  Problem Relation Age of Onset  . Arthritis Mother   . Hypertension Mother    Family Psychiatric  History: Pt did not express any hx of mental illness in family. Social History: Pt lives by self in [place], [place]. Pt is single. History  Alcohol Use No     History  Drug Use No    Comment: denies    Social History   Social History  . Marital Status: Legally Separated    Spouse Name: N/A  . Number of Children: N/A  . Years of Education: N/A   Social History Main Topics  . Smoking status: Current Every Day Smoker -- 1.00 packs/day for 40 years    Types: Cigarettes    Start date: 04/14/1974  . Smokeless tobacco: Never Used  . Alcohol Use: No  . Drug Use: No     Comment: denies  . Sexual Activity: No   Other Topics Concern  . None   Social History Narrative   Additional Social History:   Sleep: Fair  Appetite:  Fair  Current Medications: Current Facility-Administered Medications  Medication Dose Route Frequency Provider Last Rate Last Dose  . acetaminophen (TYLENOL) tablet 650 mg  650 mg Oral Q6H PRN Tanika  Malachy Chamber, NP   650 mg at 12/10/15 1609  . albuterol (PROVENTIL HFA;VENTOLIN HFA) 108 (90 BASE) MCG/ACT inhaler 2 puff  2 puff Inhalation Q6H PRN Derrill Center, NP   2 puff at 12/10/15 0758  . alum & mag hydroxide-simeth (MAALOX/MYLANTA) 200-200-20 MG/5ML suspension 30 mL  30 mL Oral Q4H PRN Derrill Center, NP   30 mL at 12/04/15 0225  . benztropine (COGENTIN) tablet 0.5 mg  0.5 mg Oral QHS Derrill Center, NP   0.5 mg at 12/10/15 2116  . feeding supplement (ENSURE ENLIVE) (ENSURE ENLIVE) liquid 237 mL  237 mL Oral BID BM Saramma Eappen,  MD   237 mL at 12/11/15 1400  . gabapentin (NEURONTIN) capsule 300 mg  300 mg Oral BID Derrill Center, NP   300 mg at 12/11/15 0911  . guaiFENesin (ROBITUSSIN) 100 MG/5ML solution 100 mg  5 mL Oral TID AC & HS Saramma Eappen, MD   100 mg at 12/11/15 1200  . lidocaine (LIDODERM) 5 % 1 patch  1 patch Transdermal Q24H [name], MD   1 patch at 12/11/15 1100  . LORazepam (ATIVAN) tablet 1 mg  1 mg Oral BID PRN [name], MD   1 mg at 12/11/15 0032  . losartan (COZAAR) tablet 50 mg  50 mg Oral Daily Derrill Center, NP   50 mg at 12/11/15 0911  . magnesium hydroxide (MILK OF MAGNESIA) suspension 30 mL  30 mL Oral Daily PRN Derrill Center, NP   30 mL at 12/05/15 0239  . mirtazapine (REMERON) tablet 15 mg  15 mg Oral QHS PRN Derrill Center, NP   15 mg at 12/10/15 2308  . nicotine polacrilex (NICORETTE) gum 2 mg  2 mg Oral PRN [name], MD   2 mg at 12/10/15 2145  . OLANZapine (ZYPREXA) tablet 10 mg  10 mg Oral TID PRN [name], MD   10 mg at 12/10/15 1454   Or  . OLANZapine (ZYPREXA) injection 10 mg  10 mg Intramuscular TID PRN [name], MD      . Derrill Memo ON 12/12/2015] paliperidone (INVEGA SUSTENNA) injection 156 mg  156 mg Intramuscular Once [name], MD      . Derrill Memo ON 01/10/2016] paliperidone (INVEGA SUSTENNA) injection 156 mg  156 mg Intramuscular Q28 days [name], MD      . paliperidone (INVEGA) 24 hr tablet 9 mg  9 mg Oral QHS [name], MD   9 mg at 12/10/15 2116  . pantoprazole (PROTONIX) EC tablet 40 mg  40 mg Oral Daily Derrill Center, NP   40 mg at 12/11/15 [id number]    Lab Results:  No results found for this or any previous visit (from the past 48 hour(s)).  Physical Findings: AIMS: Facial and Oral Movements Muscles of Facial Expression: None, normal Lips and Perioral Area: None, normal Jaw: None, normal Tongue: None, normal,Extremity Movements Upper (arms, wrists, hands, fingers): None, normal Lower (legs, knees, ankles, toes): None, normal,  Trunk Movements Neck, shoulders, hips: None, normal, Overall Severity Severity of abnormal movements (highest score from questions above): None, normal Incapacitation due to abnormal movements: None, normal Patient's awareness of abnormal movements (rate only patient's report): No Awareness, Dental Status Current problems with teeth and/or dentures?: No Does patient usually wear dentures?: No  CIWA:  CIWA-Ar Total: 10 COWS:  COWS Total Score: 8  Musculoskeletal: Strength & Muscle Tone: within normal limits Gait & Station: normal Patient leans: N/A  Psychiatric Specialty  Exam: Review of Systems  Musculoskeletal: Negative.   Neurological: Negative for dizziness and tingling.  Psychiatric/Behavioral: Positive for hallucinations. The patient is nervous/anxious.        Paranoia and delusions  All other systems reviewed and are negative.   Blood pressure 134/75, pulse 108, temperature 98.1 F (36.7 C), temperature source Oral, resp. rate 18, height 5\' 10"  (1.778 m), weight 86.183 kg (190 lb).Body mass index is 27.26 kg/(m^2).  General Appearance: Disheveled  Eye Contact::  Good staring  Speech:  Normal Rate  Volume:  varies   Mood:  Anxious , is less irritable  Affect:  Labile  Thought Process:  Disorganized and Loose with some improvement  Orientation:  Full (Time, Place, and Person)  Thought Content:  Delusions, Hallucinations: Visual, Paranoid Ideation and Rumination With improvement    Suicidal Thoughts:  No  Homicidal Thoughts:  No  Memory:  Immediate;   Fair Recent;   Poor Remote;   Poor  Judgement:  Impaired  Insight:  Shallow  Psychomotor Activity:  Restlessness  Concentration:  Poor  Recall:  Poor  Fund of Knowledge:Poor  Language: Fair  Akathisia:  No  Handed:  Right  AIMS (if indicated):   0  Assets:  Others:  access to health care  ADL's:  Impaired  Cognition: WNL   Sleep:  Number of Hours: 5.5   Treatment Plan Summary:Patient presented disorganized  ,delusional.Pt with some improvement , is more organized than on admission. Will continue medication readjustment.  Daily contact with patient to assess and evaluate symptoms and progress in treatment and Medication management   Will continue Invega  9 mg po qhs.  Invega sustenna IM 234 mg x 1 dose given 12/08/15. Next dose of Invega sustenna 156 mg IM  to be given in 4-7 days. AIMS - 0 ( 12/09/15) Will continue Cogentin 0.5 mg po qhs for EPS. Will continue Gabapentin  300 mg po bid for anxiety sx. Will continue Remeron 15 mg po qhs for affective sx as well as sleep. Will continue to monitor vitals ,medication compliance and treatment side effects while patient is here.  Will monitor for medical issues as well as [name] consult as needed.   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 Pt also has a hx of CKD- Creatinine chronically elevated.  TSH - wnl ,lipid panel- wnl ,  hba1c- wnl , PL is elevated - likely 2/2 neuroleptics- will monitor - will consider adding Abilify. Vistamin b12- wnl, folate - wnl.  CSW will start working on disposition. Pt to be referred back to ACTT - once stable. Patient to participate in therapeutic milieu .       Chynna Buerkle T. MD 12/11/2015, 2:17 PM

## 2015-12-11 NOTE — Progress Notes (Signed)
Adult Psychoeducational Group Note  Date:  12/11/2015 Time:  9:22 PM  Group Topic/Focus:  Wrap-Up Group:   The focus of this group is to help patients review their daily goal of treatment and discuss progress on daily workbooks.  Participation Level:  Active  Participation Quality:  Inattentive  Affect:  Not Congruent  Cognitive:  Disorganized and Confused  Insight: Limited  Engagement in Group:  Limited  Modes of Intervention:  Activity  Additional Comments:  Patient rated his day a 7. He did not have a goal and was not congruent with answering questions.  Gloriajean Dell Jakai Onofre 12/11/2015, 9:22 PM

## 2015-12-11 NOTE — BHH Group Notes (Signed)
[place] Group Notes:  (Nursing/MHT/Case Management/Adjunct)  Date:  12/11/2015  Time:  1015  Type of Therapy:  Nurse Education / Life Skills : The group is focused on teaching patients how to identify unhealthy behaviors and also how to modify / develop healthier ways to get your needs met.  Participation Level:  Minimal  Participation Quality:  Intrusive  Affect:  Labile  Cognitive:  Alert  Insight:  Limited  Engagement in Group:  Lacking  Modes of Intervention:  Discussion and Education  Summary of Progress/Problems:  Lauralyn Primes 12/11/2015, 12:43 PM

## 2015-12-11 NOTE — Progress Notes (Signed)
D: Patient alert and oriented. Patient denies SI/HI/AVH at this time. Patient redirected tonight in conversation about sex. Patient taking medications as ordered.  A: Patient attended group and participated. Q 15 minute checks in progress and maintained. Patient taking medications as ordered. PRN Remeron for sleep and Ativan for anxiety administered with good results noted.  R: Patient remains safe on unit. Patient observed resting in bed with eyes closed. Respirations even and non labored. No distress noted. Patient taking medications as ordered and communicating needs to staff.

## 2015-12-11 NOTE — Progress Notes (Signed)
David [name]) is irritable, intrusive and impatient today. He took his morning meds late and then he completed his daily assessment and on it he wrote he deneid SI and he rated his depression, hopelessness and anxiety " 2/ /2", respectively.   A HE talks about his family often and he wrote on his daily assessment that he wanted to " talk to my nannie who just turned 82"!   R Safety is in place and poc cont.

## 2015-12-11 NOTE — BHH Group Notes (Signed)
[place] Group Notes:  (Clinical Social Work)  12/11/2015  [place] Group Notes:  (Clinical Social Work)  12/11/2015  11:00AM-12:00PM  Summary of Progress/Problems:  The main focus of today's process group was to listen to a variety of genres of music and to identify that different types of music provoke different responses.  The patient then was able to identify personally what was soothing for them, as well as energizing.   The patient was very disorganized and labile, at times crying and at other times angry, throughout group.   Type of Therapy:  Music Therapy   Participation Level:  Active  Participation Quality:  Attentive and Inattentive  Affect:  Labile  Cognitive:  Confused and Delusional and Disorganized  Insight:  Poor  Engagement in Therapy:  Improving  Modes of Intervention:   Activity, Exploration  Selmer Dominion, LCSW 12/11/2015

## 2015-12-12 MED ORDER — HALOPERIDOL 5 MG PO TABS
5.0000 mg | ORAL_TABLET | Freq: Every day | ORAL | Status: DC
  Administered 2015-12-12: 5 mg via ORAL
  Filled 2015-12-12 (×3): qty 1

## 2015-12-12 NOTE — Progress Notes (Addendum)
[facility] MD Progress Note  12/12/2015 2:29 PM David [name]  MRN:  [id number]   Subjective: Patient states "I am doing well."   Objective: Patient is a 39 y old CM , who has a hx of schizophrenia , who presented to [place]- for hearing [facility] as well as asking to go to [facility].  Patient seen and chart reviewed.Discussed patient with treatment team.  Pt today appears disorganized, paranoid , labile, delusional. Pt has been making some progress , but continues to need a lot of redirection on the unit. Pt per staff is intrusive and needs encouragement and support , to take his medications as well as participate in milieu.  Per collateral information obtained from ACTT- Pt has a chronic hx of schizophrenia , was at [facility] recently - stayed there for 15 month and was very depressed, catatonic during that admission . Pt was doing well for a while after discharge  , but then started decompensating. Pt was noncompliant on his PO medications - was discharged on Haldol decanoate IM.  Principal Problem: Paranoid schizophrenia ([place]) Diagnosis:   Patient Active Problem List   Diagnosis Date Noted  . 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 History of idiopathic thrombocytopenic purpura [Z86.2] 12/05/2015  . Paranoid schizophrenia ([place]) [F20.0] 12/03/2015  . HTN (hypertension) [I10] 03/04/2012  . GERD (gastroesophageal reflux disease) [K21.9] 03/04/2012  . Insomnia [G47.00] 03/04/2012  . CKD (chronic kidney disease) stage 2, GFR 60-89 ml/min [N18.2] 03/04/2012  . Schizophrenia, paranoid, chronic ([place]) [F20.0] 02/18/2012   Total Time spent with patient: 25 minutes  Past Psychiatric History: Pt was admitted to [facility] - schizophrenia - in 2013, was discharged on Haldol decanoate IM - and was referred to [facility].Pt also had recent hospitalization at [facility] - April 2016. Pt has an ACTT.  Past Medical History:  Past Medical History  Diagnosis Date  . Schizophrenia ([place])   . Pneumonia   . Anxiety   . GERD  (gastroesophageal reflux disease)   . COPD (chronic obstructive pulmonary disease) [facility])     Past Surgical History  Procedure Laterality Date  . Tonsillectomy     Family History:  Family History  Problem Relation Age of Onset  . Arthritis Mother   . Hypertension Mother    Family Psychiatric  History: Pt did not express any hx of mental illness in family. Social History: Pt lives by self in [place], [place]. Pt is single. History  Alcohol Use No     History  Drug Use No    Comment: denies    Social History   Social History  . Marital Status: Legally Separated    Spouse Name: N/A  . Number of Children: N/A  . Years of Education: N/A   Social History Main Topics  . Smoking status: Current Every Day Smoker -- 1.00 packs/day for 40 years    Types: Cigarettes    Start date: 04/14/1974  . Smokeless tobacco: Never Used  . Alcohol Use: No  . Drug Use: No     Comment: denies  . Sexual Activity: No   Other Topics Concern  . None   Social History Narrative   Additional Social History:   Sleep: Fair  Appetite:  Fair  Current Medications: Current Facility-Administered Medications  Medication Dose Route Frequency Provider Last Rate Last Dose  . acetaminophen (TYLENOL) tablet 650 mg  650 mg Oral Q6H PRN Derrill Center, NP   650 mg at 12/11/15 2048  . albuterol (PROVENTIL HFA;VENTOLIN HFA) 108 (90 BASE) MCG/ACT inhaler  2 puff  2 puff Inhalation Q6H PRN Derrill Center, NP   2 puff at 12/10/15 0758  . alum & mag hydroxide-simeth (MAALOX/MYLANTA) 200-200-20 MG/5ML suspension 30 mL  30 mL Oral Q4H PRN Derrill Center, NP   30 mL at 12/04/15 0225  . benztropine (COGENTIN) tablet 0.5 mg  0.5 mg Oral QHS Derrill Center, NP   0.5 mg at 12/11/15 2044  . feeding supplement (ENSURE ENLIVE) (ENSURE ENLIVE) liquid 237 mL  237 mL Oral BID BM Mohmed Farver, MD   237 mL at 12/12/15 1400  . gabapentin (NEURONTIN) capsule 300 mg  300 mg Oral BID Derrill Center, NP   300 mg at 12/12/15 [id number]  .  haloperidol (HALDOL) tablet 5 mg  5 mg Oral QHS Gelisa Tieken, MD      . lidocaine (LIDODERM) 5 % 1 patch  1 patch Transdermal Q24H [name], MD   1 patch at 12/12/15 1047  . LORazepam (ATIVAN) tablet 1 mg  1 mg Oral BID PRN [name], MD   1 mg at 12/11/15 2302  . losartan (COZAAR) tablet 50 mg  50 mg Oral Daily Derrill Center, NP   50 mg at 12/12/15 0809  . magnesium hydroxide (MILK OF MAGNESIA) suspension 30 mL  30 mL Oral Daily PRN Derrill Center, NP   30 mL at 12/05/15 0239  . mirtazapine (REMERON) tablet 15 mg  15 mg Oral QHS PRN Derrill Center, NP   15 mg at 12/11/15 2044  . nicotine polacrilex (NICORETTE) gum 2 mg  2 mg Oral PRN [name], MD   2 mg at 12/11/15 1848  . OLANZapine (ZYPREXA) tablet 10 mg  10 mg Oral TID PRN [name], MD   10 mg at 12/11/15 2044   Or  . OLANZapine (ZYPREXA) injection 10 mg  10 mg Intramuscular TID PRN [name], MD      . Derrill Memo ON 01/10/2016] paliperidone (INVEGA SUSTENNA) injection 156 mg  156 mg Intramuscular Q28 days [name], MD      . pantoprazole (PROTONIX) EC tablet 40 mg  40 mg Oral Daily Derrill Center, NP   40 mg at 12/12/15 [id number]    Lab Results:  No results found for this or any previous visit (from the past 46 hour(s)).  Physical Findings: AIMS: Facial and Oral Movements Muscles of Facial Expression: None, normal Lips and Perioral Area: None, normal Jaw: None, normal Tongue: None, normal,Extremity Movements Upper (arms, wrists, hands, fingers): None, normal Lower (legs, knees, ankles, toes): None, normal, Trunk Movements Neck, shoulders, hips: None, normal, Overall Severity Severity of abnormal movements (highest score from questions above): None, normal Incapacitation due to abnormal movements: None, normal Patient's awareness of abnormal movements (rate only patient's report): No Awareness, Dental Status Current problems with teeth and/or dentures?: No Does patient usually wear dentures?: No  CIWA:   CIWA-Ar Total: 10 COWS:  COWS Total Score: 8  Musculoskeletal: Strength & Muscle Tone: within normal limits Gait & Station: normal Patient leans: N/A  Psychiatric Specialty Exam: Review of Systems  Musculoskeletal: Negative.   Neurological: Negative for dizziness and tingling.  Psychiatric/Behavioral: Positive for hallucinations. The patient is nervous/anxious.        Paranoia and delusions  All other systems reviewed and are negative.   Blood pressure 148/86, pulse 110, temperature 98.5 F (36.9 C), temperature source Oral, resp. rate 16, height 5\' 10"  (1.778 m), weight 86.183 kg (190 lb).Body mass index is 27.26 kg/(m^2).  General Appearance: Fairly Groomed  Engineer, water::  Good   Speech:  Normal Rate  Volume:  varies   Mood:  Anxious , is less irritable  Affect:  Labile  Thought Process:  Disorganized and Loose with some improvement  Orientation:  Full (Time, Place, and Person)  Thought Content:  Delusions, Hallucinations: Visual, Paranoid Ideation and Rumination With improvement    Suicidal Thoughts:  No  Homicidal Thoughts:  No  Memory:  Immediate;   Fair Recent;   Poor Remote;   Poor  Judgement:  Impaired  Insight:  Shallow  Psychomotor Activity:  Restlessness  Concentration:  Poor  Recall:  Poor  Fund of Knowledge:Poor  Language: Fair  Akathisia:  No  Handed:  Right  AIMS (if indicated):   0  Assets:  Others:  access to health care  ADL's:  Impaired  Cognition: WNL   Sleep:  Number of Hours: 5.5   Treatment Plan Summary:Patient presented disorganized ,delusional.Pt with some improvement , is more organized than on admission. Will continue medication readjustment.  Daily contact with patient to assess and evaluate symptoms and progress in treatment and Medication management   Will continue  Invega sustenna IM-  234 mg x 1 dose given 12/08/15,  Invega sustenna 156 mg IM  to be given on 12/12/15. AIMS - 0 ( 12/12/15) Will discontinue Invega po . Will restart  Haldol - start with a low dose of 5 mg po qhs for augmenting the effect of Invega sustenna . Will continue Cogentin 0.5 mg po qhs for EPS. Will continue Gabapentin  300 mg po bid for anxiety sx. Will continue Remeron 15 mg po qhs for affective sx as well as sleep. Will continue to monitor vitals ,medication compliance and treatment side effects while patient is here.  Will monitor for medical issues as well as call consult as needed.   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 Pt also has a hx of CKD- Creatinine chronically elevated.  TSH - wnl ,lipid panel- wnl ,  hba1c- wnl , PL is elevated - likely 2/2 neuroleptics- will monitor. Vitamin b12- wnl, folate - wnl.  CSW will start working on disposition. Pt to be referred back to ACTT - once stable. Patient to participate in therapeutic milieu .       Jamahl Lemmons MD 12/12/2015, 2:29 PM

## 2015-12-12 NOTE — Progress Notes (Signed)
Nursing Note: 0700-1900  D:  Pt presents anxious, paces the unit interacting with others in the milieu.    Pt tells this RN, "I  voted yes for an emergency evacuation today.  I would like to see how this is done here." Pt disruptive in group, needing to be redirected several times, pt was off-topic and would state random and tangential statements, "What is wrong with this damn government?  What is really wrong about communism?"  A:  Encouraged to verbalize needs and concerns, active listening and support provided.  Continued Q 15 minute safety checks.  Observed active participation in group settings. Pt taking meds as prescribed, Invega monthly injection given as ordered today."  R:  Pt. denies A/V hallucinations and is currently able to verbally contract for safety.

## 2015-12-12 NOTE — Progress Notes (Signed)
Patient ID: David [name], male   DOB: [birth year], 59 y.o.   MRN: [id number]  D: Patient pleasant and cooperative with care this shift. Pt interacting well with peers/staff and participated in the HS wrap up group. Pt allowed to shave and afterwards stated "I feel much better when I look presentable".  A: Q 15 minute safety checks, encourage nutrition and fluid intake, encourage staff/peer interaction, administer medications as ordered. R: Pt denies SI/HI or plans to harm himself. No s/s of distress noted.

## 2015-12-12 NOTE — BHH Group Notes (Signed)
[facility] Group Notes:  (Counselor/Nursing/MHT/Case Management/Adjunct)  12/12/2015 1:15PM  Type of Therapy:  Group Therapy  Participation Level:  Active  Participation Quality:  Appropriate  Affect:  Flat  Cognitive:  Oriented  Insight:  Improving  Engagement in Group:  Limited  Engagement in Therapy:  Limited  Modes of Intervention:  Discussion, Exploration and Socialization  Summary of Progress/Problems: The topic for group was balance in life.  Pt participated in the discussion about when their life was in balance and out of balance and how this feels.  Pt discussed ways to get back in balance and short term goals they can work on to get where they want to be. Stayed the entire time, unfortunately.  Pressured speech. Impulsive.  Unable to sit quietly for any period of time.  Had to be reminded many times.  Comments were unrelated to the topic.  They ranged from [facility] to communism to smoking locust tree leaves to the imminent destruction of the universe.     Roque Lias B 12/12/2015 2:45 PM

## 2015-12-12 NOTE — Progress Notes (Signed)
Patient ID: David [name], male   DOB: [birth year], 59 y.o.   MRN: [id number] PER STATE REGULATIONS 482.30  THIS CHART WAS REVIEWED FOR MEDICAL NECESSITY WITH RESPECT TO THE PATIENT'S ADMISSION/ DURATION OF STAY.  NEXT REVIEW DATE: 12/15/2015  Chauncy Lean, RN, BSN CASE MANAGER'

## 2015-12-13 DIAGNOSIS — Z862 Personal history of diseases of the blood and blood-forming organs and certain disorders involving the immune mechanism: Secondary | ICD-10-CM

## 2015-12-13 MED ORDER — HALOPERIDOL 5 MG PO TABS
7.5000 mg | ORAL_TABLET | Freq: Every day | ORAL | Status: DC
  Administered 2015-12-13: 7.5 mg via ORAL
  Filled 2015-12-13 (×3): qty 2

## 2015-12-13 MED ORDER — NICOTINE 14 MG/24HR TD PT24
14.0000 mg | MEDICATED_PATCH | Freq: Every day | TRANSDERMAL | Status: DC
  Administered 2015-12-13 – 2015-12-23 (×11): 14 mg via TRANSDERMAL
  Filled 2015-12-13 (×16): qty 1

## 2015-12-13 NOTE — Progress Notes (Signed)
[facility] MD Progress Note  12/13/2015 2:01 PM David [name]  MRN:  [id number]   Subjective: Patient states "I want to go home and take care of some stuff. I will come back. I want to tell you about David [name] cooper who is in this tower.'    Objective: Patient is a 33 y old CM , who has a hx of schizophrenia , who presented to [facility]- for hearing AH as well as asking to go to [facility].  Patient seen and chart reviewed.Discussed patient with treatment team.  Pt today appears to be irritable , came lashing at [employer] , agitated , since Probation officer would not let patient return home today. Pt per staff continues to be labile , delusional , tangential - talks about random thoughts , cannot participate in groups well . Pt required PRN Zyprexa to calm him down. Pt per [facility] continues to make use of prn zyprexa as well as ativan daily.   Per collateral information obtained from ACTT- Pt has a chronic hx of schizophrenia , was at [facility] recently - stayed there for 15 month and was very depressed, catatonic during that admission . Pt was doing well for a while after discharge  , but then started decompensating. Pt was noncompliant on his PO medications - was discharged on Haldol decanoate IM.  Principal Problem: Paranoid schizophrenia ([place]) Diagnosis:   Patient Active Problem List   Diagnosis Date Noted  . 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 History of idiopathic thrombocytopenic purpura [Z86.2] 12/05/2015  . Paranoid schizophrenia ([place]) [F20.0] 12/03/2015  . HTN (hypertension) [I10] 03/04/2012  . GERD (gastroesophageal reflux disease) [K21.9] 03/04/2012  . Insomnia [G47.00] 03/04/2012  . CKD (chronic kidney disease) stage 2, GFR 60-89 ml/min [N18.2] 03/04/2012  . Schizophrenia, paranoid, chronic ([place]) [F20.0] 02/18/2012   Total Time spent with patient: 25 minutes  Past Psychiatric History: Pt was admitted to [facility] - schizophrenia - in 2013, was discharged on Haldol decanoate IM - and was referred to [facility].Pt  also had recent hospitalization at [facility] - April 2016. Pt has an ACTT.  Past Medical History:  Past Medical History  Diagnosis Date  . Schizophrenia ([place])   . Pneumonia   . Anxiety   . GERD (gastroesophageal reflux disease)   . COPD (chronic obstructive pulmonary disease) [facility])     Past Surgical History  Procedure Laterality Date  . Tonsillectomy     Family History:  Family History  Problem Relation Age of Onset  . Arthritis Mother   . Hypertension Mother    Family Psychiatric  History: Pt did not express any hx of mental illness in family. Social History: Pt lives by self in [place], [place]. Pt is single. History  Alcohol Use No     History  Drug Use No    Comment: denies    Social History   Social History  . Marital Status: Legally Separated    Spouse Name: N/A  . Number of Children: N/A  . Years of Education: N/A   Social History Main Topics  . Smoking status: Current Every Day Smoker -- 1.00 packs/day for 40 years    Types: Cigarettes    Start date: 04/14/1974  . Smokeless tobacco: Never Used  . Alcohol Use: No  . Drug Use: No     Comment: denies  . Sexual Activity: No   Other Topics Concern  . None   Social History Narrative   Additional Social History:   Sleep: Fair  Appetite:  Fair  Current Medications:  Current Facility-Administered Medications  Medication Dose Route Frequency Provider Last Rate Last Dose  . acetaminophen (TYLENOL) tablet 650 mg  650 mg Oral Q6H PRN Derrill Center, NP   650 mg at 12/13/15 1118  . albuterol (PROVENTIL HFA;VENTOLIN HFA) 108 (90 BASE) MCG/ACT inhaler 2 puff  2 puff Inhalation Q6H PRN Derrill Center, NP   2 puff at 12/10/15 0758  . alum & mag hydroxide-simeth (MAALOX/MYLANTA) 200-200-20 MG/5ML suspension 30 mL  30 mL Oral Q4H PRN Derrill Center, NP   30 mL at 12/04/15 0225  . benztropine (COGENTIN) tablet 0.5 mg  0.5 mg Oral QHS Derrill Center, NP   0.5 mg at 12/12/15 2117  . feeding supplement (ENSURE ENLIVE) (ENSURE  ENLIVE) liquid 237 mL  237 mL Oral BID BM Laikyn Gewirtz, MD   237 mL at 12/12/15 1400  . gabapentin (NEURONTIN) capsule 300 mg  300 mg Oral BID Derrill Center, NP   300 mg at 12/13/15 [id number]  . haloperidol (HALDOL) tablet 7.5 mg  7.5 mg Oral QHS Mariusz Jubb, MD      . lidocaine (LIDODERM) 5 % 1 patch  1 patch Transdermal Q24H [name], MD   1 patch at 12/13/15 1055  . LORazepam (ATIVAN) tablet 1 mg  1 mg Oral BID PRN [name], MD   1 mg at 12/12/15 2140  . losartan (COZAAR) tablet 50 mg  50 mg Oral Daily Derrill Center, NP   50 mg at 12/13/15 [id number]  . magnesium hydroxide (MILK OF MAGNESIA) suspension 30 mL  30 mL Oral Daily PRN Derrill Center, NP   30 mL at 12/05/15 0239  . mirtazapine (REMERON) tablet 15 mg  15 mg Oral QHS PRN Derrill Center, NP   15 mg at 12/12/15 2119  . nicotine polacrilex (NICORETTE) gum 2 mg  2 mg Oral PRN [name], MD   2 mg at 12/12/15 1622  . OLANZapine (ZYPREXA) tablet 10 mg  10 mg Oral TID PRN [name], MD   10 mg at 12/13/15 0936   Or  . OLANZapine (ZYPREXA) injection 10 mg  10 mg Intramuscular TID PRN [name], MD      . Derrill Memo ON 01/10/2016] paliperidone (INVEGA SUSTENNA) injection 156 mg  156 mg Intramuscular Q28 days [name], MD      . pantoprazole (PROTONIX) EC tablet 40 mg  40 mg Oral Daily Derrill Center, NP   40 mg at 12/13/15 [id number]    Lab Results:  No results found for this or any previous visit (from the past 48 hour(s)).  Physical Findings: AIMS: Facial and Oral Movements Muscles of Facial Expression: None, normal Lips and Perioral Area: None, normal Jaw: None, normal Tongue: None, normal,Extremity Movements Upper (arms, wrists, hands, fingers): None, normal Lower (legs, knees, ankles, toes): None, normal, Trunk Movements Neck, shoulders, hips: None, normal, Overall Severity Severity of abnormal movements (highest score from questions above): None, normal Incapacitation due to abnormal movements: None,  normal Patient's awareness of abnormal movements (rate only patient's report): No Awareness, Dental Status Current problems with teeth and/or dentures?: No Does patient usually wear dentures?: No  CIWA:  CIWA-Ar Total: 10 COWS:  COWS Total Score: 8  Musculoskeletal: Strength & Muscle Tone: within normal limits Gait & Station: normal Patient leans: N/A  Psychiatric Specialty Exam: Review of Systems  Musculoskeletal: Negative.   Neurological: Negative for dizziness and tingling.  Psychiatric/Behavioral: Positive for hallucinations. The patient is nervous/anxious.  Paranoia and delusions  All other systems reviewed and are negative.   Blood pressure 148/86, pulse 110, temperature 98.5 F (36.9 C), temperature source Oral, resp. rate 16, height 5\' 10"  (1.778 m), weight 86.183 kg (190 lb).Body mass index is 27.26 kg/(m^2).  General Appearance: Fairly Groomed  Engineer, water::  Good   Speech:  Normal Rate  Volume:  varies   Mood:  Angry and Anxious  irritable  Affect:  Labile pt is upset that he will not be discharged  Thought Process:  Disorganized and Loose  Orientation:  Full (Time, Place, and Person)  Thought Content:  Delusions, Paranoid Ideation and Rumination    Suicidal Thoughts:  No  Homicidal Thoughts:  No  Memory:  Immediate;   Fair Recent;   Poor Remote;   Poor  Judgement:  Impaired  Insight:  Shallow  Psychomotor Activity:  Restlessness  Concentration:  Poor  Recall:  Poor  Fund of Knowledge:Poor  Language: Fair  Akathisia:  No  Handed:  Right  AIMS (if indicated):     Assets:  Others:  access to health care  ADL's:  Impaired  Cognition: WNL   Sleep:  Number of Hours: 5.5   Treatment Plan Summary:Patient presented disorganized ,delusional.Pt today is seen as irritable, labile , came rushing towards writer this AM - agitated , since writer would not discharge patient. Pt will continue to need inpatient stay.   Daily contact with patient to assess and  evaluate symptoms and progress in treatment and Medication management   Pt received Invega sustenna IM-  234 mg x 1 dose given 12/08/15,  Invega sustenna 156 mg IM  -  12/12/15. AIMS - 0 ( 12/12/15) Pt is currently restarted on Haldol , dose increased to 7.5 mg po qhs for augmenting the effect of Invega. Pt on admission was on two antipsychotics- although he was not compliant.  Will continue Cogentin 0.5 mg po qhs for EPS. Will continue Gabapentin  300 mg po bid for anxiety sx. Will continue Remeron 15 mg po qhs for affective sx as well as sleep. Will continue to monitor vitals ,medication compliance and treatment side effects while patient is here.  Will monitor for medical issues as well as call consult as needed.   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 Pt also has a hx of CKD- Creatinine chronically elevated.  TSH - wnl ,lipid panel- wnl ,  hba1c- wnl , PL is elevated - likely 2/2 neuroleptics- will monitor. Vitamin b12- wnl, folate - wnl.  CSW will start working on disposition. Pt to be referred back to ACTT - once stable. Patient to participate in therapeutic milieu .       Riana Tessmer MD 12/13/2015, 2:01 PM

## 2015-12-13 NOTE — Tx Team (Signed)
Interdisciplinary Treatment Plan Update (Adult)  Date:  12/13/2015   Time Reviewed:  8:15 AM   Progress in Treatment: Attending groups: Yes. Participating in groups:  Yes. Taking medication as prescribed:  Yes. Tolerating medication:  Yes. Family/Significant other contact made: Yes  ACT team and parents Patient understands diagnosis:  No  Limited insight Discussing patient identified problems/goals with staff:  Yes, see initial care plan. Medical problems stabilized or resolved:  Yes. Denies suicidal/homicidal ideation: Yes. Issues/concerns per patient self-inventory:  No. Other:  New problem(s) identified:  Discharge Plan or Barriers: see below  Reason for Continuation of Hospitalization: Medication stabilization Disorganization, Flight of ideas  Comments:  Patient presents disorganized , has limited insight, is a poor historian. Collateral information was obtained from ACTT. Will continue medication readjustment. Will cross taper Zyprexa with Invega . Plan to DC pt on Invega sustenna IM - if he tolerates it well. Will not restart Haldol at this time. Will continue Cogentin 0.5 mg po qhs for EPS. Will continue Gabapentin 300 mg po bid for anxiety sx. Will continue Remeron 15 mg po qhs for affective sx as well as sleep.  12/08/15: Patient continues to present as disorganized ,delusional. Pt also has labile mood, requires a lot of redirection on the unit. Will continue medication readjustment.  Will increase Invega to 9 mg po qhs. Plan to DC pt on Invega sustenna IM - if he tolerates it well. Will provide Invega sustenna 234 mg IM tomorrow. Will not restart Haldol at this time. Will continue Cogentin 0.5 mg po qhs for EPS. Will continue Gabapentin 300 mg po bid for anxiety sx. Will continue Remeron 15 mg po qhs for affective sx as well as sleep.  12/13/15: Will continue Invega sustenna IM- 234 mg x 1 dose given 12/08/15, Invega sustenna 156 mg IM to be given on 12/12/15.  AIMS - 0 ( 12/12/15) Will discontinue Invega po . Will restart Haldol - start with a low dose of 5 mg po qhs for augmenting the effect of Invega sustenna . Will continue Cogentin 0.5 mg po qhs for EPS. Will continue Gabapentin 300 mg po bid for anxiety sx. Will continue Remeron 15 mg po qhs for affective sx as well as sleep.  Estimated length of stay: 4-5 days  New goal(s):  Review of initial/current patient goals per problem list:   Review of initial/current patient goals per problem list:  1. Goal(s): Patient will participate in aftercare plan   Met: Yes   Target date: 3-5 days post admission date   As evidenced by: Patient will participate within aftercare plan AEB aftercare provider and housing plan at discharge being identified.  12/06/15: Return home, follow up outpt    5. Goal(s): Patient will demonstrate decreased signs of psychosis  * Met: No  * Target date: 3-5 days post admission date  * As evidenced by: Patient will demonstrate decreased frequency of AVH or return to baseline function 12/06/15:  Bizarre, disorganized, poor historian 12/08/15:  Speech is now clearer, intelligible, and pt is more goal directed.  However, remains disorganized, lacking insight and displaying mood lability.  Upset tha he cannot go home on pass to get his shoes 12/13/15:  Continues to be disorganized, tangential with flight of ideas           Attendees: Patient:  12/13/2015 8:15 AM   Family:   12/13/2015 8:15 AM   Physician:  [name], MD 12/13/2015 8:15 AM   Nursing:   Ashby Dawes, RN 12/13/2015 8:15  AM   CSW:    Roque Lias, [place]   12/13/2015 8:15 AM   Other:  12/13/2015 8:15 AM   Other:   12/13/2015 8:15 AM   Other:  Lars Pinks, Nurse CM 12/13/2015 8:15 AM   Other:   12/13/2015 8:15 AM   Other:  Norberto Sorenson, P4CC  12/13/2015 8:15 AM   Other:  12/13/2015 8:15 AM   Other:  12/13/2015 8:15 AM   Other:  12/13/2015 8:15 AM   Other:  12/13/2015 8:15 AM    Other:  12/13/2015 8:15 AM   Other:   12/13/2015 8:15 AM    Scribe for Treatment Team:   Trish Mage, 12/13/2015 8:15 AM

## 2015-12-13 NOTE — Progress Notes (Signed)
D: Pt was disorganized. Spoke on matters from a woman named Aldona Bar, to his brother named Saralyn Pilar that hugs all the time, to planting a garden.  Pt requested his night meds as well as remeron for sleep. Pt has no other questions or concerns.    A:  Support and encouragement was offered. 15 min checks continued for safety.  R: Pt remains safe.

## 2015-12-13 NOTE — Progress Notes (Signed)
D: Pt approached the counter at the nurses station, and lightly banged on the counter asking for ativan. Writer reminded pt that he'd received his ativan earlier, but had zyprexa that was available. Pt has no other questions or concerns.   A: Pt was given prn dose of zyprexa and encouraged to lay down. Support and encouragement was offered. 15 min checks continued for safety.  R: Pt remains safe.

## 2015-12-13 NOTE — Progress Notes (Signed)
Patient ID: David [name], male   DOB: 29-Aug-1956, 59 y.o.   MRN: [id number] D   ---  Pt. Denies pain and agreed to contract for safety at this time.   He maintains a bizarre affect and is agitated and anxious at times.  He has required PRN meds one time today after getting upset on the telephone.  He appears to be having A/V hallucinations AEB his Rambling talk  saying he sees little children  Running about on the hall.  His conversation is tangential and difficult to follow or understand.  Pt. Talks about [place] in [place] and having 2 prisoners that he needs to execute.   Pt. Also talks about religion and he is the only one who understands.   Pt. Was upset that he did not go home today as he had thought he would.   Pt. Attends group, but is distracting to peers and can not stay on topic.  ---  A  ---  Support, meds and encouragement [provided.  --- R --  Pt. Remains safe but confused on unit

## 2015-12-13 NOTE — BHH Group Notes (Signed)
[place] LCSW Group Therapy  12/13/2015 , 12:45 PM   Type of Therapy:  Group Therapy  Participation Level:  Active  Participation Quality:  Attentive  Affect:  Appropriate  Cognitive:  Alert  Insight:  Improving  Engagement in Therapy:  Engaged  Modes of Intervention:  Discussion, Exploration and Socialization  Summary of Progress/Problems: Today's group focused on the term Diagnosis.  Participants were asked to define the term, and then pronounce whether it is a negative, positive or neutral term.  I allowed him to stay for the first 5 minutes.  During that time he was blurting out random thoughts that were popping into his head.  I dismissed him after asking him to stop because he was unable to comply.  He went willingly.  David [name] B 12/13/2015 , 12:45 PM

## 2015-12-14 MED ORDER — HALOPERIDOL 5 MG PO TABS
10.0000 mg | ORAL_TABLET | Freq: Every day | ORAL | Status: DC
  Administered 2015-12-14 – 2015-12-19 (×6): 10 mg via ORAL
  Filled 2015-12-14 (×9): qty 2

## 2015-12-14 NOTE — BHH Group Notes (Signed)
[facility] LCSW Aftercare Discharge Planning Group Note   12/14/2015 12:18 PM  Participation Quality:  Invited.  Chose to not attend    [place], Barbaraann Rondo B

## 2015-12-14 NOTE — Progress Notes (Signed)
DAR NOTE: Patient presents with anxious affect and depressed mood.  Denies pain, auditory and visual hallucinations.  Rates depression at 0, hopelessness at 0, and anxiety at 0.  Maintained on routine safety checks.  Medications given as prescribed.  Support and encouragement offered as needed.  Attended group and participated.  States goal for today is to go home.  Patient observed socializing with peers in the dayroom.  Offered no complaint.

## 2015-12-14 NOTE — Progress Notes (Signed)
Pt currently presents with a sad affect and depressed behavior. Per self inventory, pt rates depression at a 2, hopelessness 1 and anxiety 1 Pt's daily goal is to "empire" and intend to do so by "give a few ordres." Pt reports fair sleep, a good appetite, normal energy and good concentration. Pt provided with medications per providers orders. Pt's labs and vitals were monitored throughout the day. Pt supported emotionally and encouraged to express concerns and questions. Pt educated on medications. Pt's safety ensured with 15 minute and environmental checks. Pt currently denies SI/HI and A/V hallucinations. Pt verbally agrees to seek staff if SI/HI or A/VH occurs and to consult with staff before acting on these thoughts. Will continue POC.

## 2015-12-14 NOTE — Progress Notes (Signed)
Recreation Therapy Notes  12.14.2016   11:30am Per MD order LRT met with patient to investigate ways to enhance patient tx during admission. Patient spoke in delusional statements, referencing WWII and the [employer] Patient additionally made homosexual references, making statements in reference to sexual activity with another man. Patient was not specific. Patient stated that he was the number 1 tennis player in the [place] and that he frequently plays tennis with [employer]. Patient additionally state he was previously in [employer] and he enjoys building things. LRT asked if she could return with something for them to build, patient stated he was agreeable to work with LRT additionally during admission.   2:45pm LRT returned with cup stack activity, activity requires patient build a tower 12' tall out of small plastic cups. Patient stacked and unstacked the cups numerous times and ultimately left them in a stack and said he was done building. LRT stated that she would return tomorrow and patient could try again. Patient agreeable.   Laureen Ochs Kwamaine Cuppett, LRT/CTRS  [name] 12/14/2015 3:55 PM

## 2015-12-14 NOTE — Progress Notes (Signed)
   D: Pt is more subdued today than previous day. Pt asked for ativan because "he wants to get better sleep tonight". Pt was observed sitting quietly in the dayroom watching tv.  However, later in the evening writer was informed that the pt came to the nurses station and bang on the counter requesting a nicotine patch. Pt has no other questions or concerns.   A:  Support and encouragement was offered. 15 min checks continued for safety.  R: Pt remains safe.

## 2015-12-14 NOTE — Progress Notes (Signed)
Patient ID: David [name], male   DOB: 1956-11-08, 59 y.o.   MRN: [id number] D: Client visible on the unit, tangential, from one thought to another has periods of clarity, aware of date, time, and who the president. "Obama for right now, Trump coming in" "I want me a cough drop" A: Writer provided emotional support, encouraged group. Medications reviewed, administered medications as ordered.. Staff will monitor q26min for safety. R: Client is safe on the unit, attended group;

## 2015-12-14 NOTE — Plan of Care (Signed)
Problem: Alteration in thought process Goal: LTG-Patient behavior demonstrates decreased signs psychosis (Patient behavior demonstrates decreased signs of psychosis to the point the patient is safe to return home and continue treatment in an outpatient setting.)  Outcome: Progressing Pt was calm and cooperative with his care.

## 2015-12-14 NOTE — BHH Group Notes (Signed)
[place] [facility] Group Notes:  (Counselor/Nursing/MHT/Case Management/Adjunct)  12/14/2015 1:15PM  Type of Therapy:  Group Therapy  Participation Level:  Active  Participation Quality:  Appropriate  Affect:  Flat  Cognitive:  Oriented  Insight:  Improving  Engagement in Group:  Limited  Engagement in Therapy:  Limited  Modes of Intervention:  Discussion, Exploration and Socialization  Summary of Progress/Problems: The topic for group was balance in life.  Pt participated in the discussion about when their life was in balance and out of balance and how this feels.  Pt discussed ways to get back in balance and short term goals they can work on to get where they want to be. Wandered in towards the end.  Did not interrupt.  Listened attentively, and then gave relevant and concise feedback to another patient re: medication.  Was given positive reinforcement for this, and got a huge grin on his face.   Trish Mage 12/14/2015 3:31 PM

## 2015-12-14 NOTE — Progress Notes (Signed)
[facility] MD Progress Note  12/14/2015 3:23 PM David [name]  MRN:  [id number]   Subjective: Patient states "I am doing better.'     Objective: Patient is a 58 y old CM , who has a hx of schizophrenia , who presented to [place]- for hearing AH as well as asking to go to [facility].  Patient seen and chart reviewed.Discussed patient with treatment team.  Pt today appears to less irritable than yesterday. Pt is more organized , however continues to have delusional thoughts, tangential thought process and needs a lot of redirections during the evaluation. Per nursing pt continues to have periods when he is disruptive on the unit - banging on the nursing station desk as well as doors - when he needs help and so on.  Pt has been compliant on his medications. Denies side effects.Will continue to encourage pt to attend groups and participate.    Per collateral information obtained from ACTT- Pt has a chronic hx of schizophrenia , was at [facility] recently - stayed there for 15 month and was very depressed, catatonic during that admission . Pt was doing well for a while after discharge  , but then started decompensating. Pt was noncompliant on his PO medications - was discharged on Haldol decanoate IM.  Principal Problem: Paranoid schizophrenia ([place]) Diagnosis:   Patient Active Problem List   Diagnosis Date Noted  . 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 History of idiopathic thrombocytopenic purpura [Z86.2] 12/05/2015  . Paranoid schizophrenia ([place]) [F20.0] 12/03/2015  . HTN (hypertension) [I10] 03/04/2012  . GERD (gastroesophageal reflux disease) [K21.9] 03/04/2012  . Insomnia [G47.00] 03/04/2012  . CKD (chronic kidney disease) stage 2, GFR 60-89 ml/min [N18.2] 03/04/2012  . Schizophrenia, paranoid, chronic ([place]) [F20.0] 02/18/2012   Total Time spent with patient: 25 minutes  Past Psychiatric History: Pt was admitted to [facility] - schizophrenia - in 2013, was discharged on Haldol decanoate IM - and was  referred to [facility].Pt also had recent hospitalization at [facility] - April 2016. Pt has an ACTT.  Past Medical History:  Past Medical History  Diagnosis Date  . Schizophrenia ([place])   . Pneumonia   . Anxiety   . GERD (gastroesophageal reflux disease)   . COPD (chronic obstructive pulmonary disease) [facility])     Past Surgical History  Procedure Laterality Date  . Tonsillectomy     Family History:  Family History  Problem Relation Age of Onset  . Arthritis Mother   . Hypertension Mother    Family Psychiatric  History: Pt did not express any hx of mental illness in family. Social History: Pt lives by self in [place], [place]. Pt is single. History  Alcohol Use No     History  Drug Use No    Comment: denies    Social History   Social History  . Marital Status: Legally Separated    Spouse Name: N/A  . Number of Children: N/A  . Years of Education: N/A   Social History Main Topics  . Smoking status: Current Every Day Smoker -- 1.00 packs/day for 40 years    Types: Cigarettes    Start date: 04/14/1974  . Smokeless tobacco: Never Used  . Alcohol Use: No  . Drug Use: No     Comment: denies  . Sexual Activity: No   Other Topics Concern  . None   Social History Narrative   Additional Social History:   Sleep: Fair  Appetite:  Fair  Current Medications: Current Facility-Administered Medications  Medication Dose Route  Frequency Provider Last Rate Last Dose  . acetaminophen (TYLENOL) tablet 650 mg  650 mg Oral Q6H PRN Derrill Center, NP   650 mg at 12/14/15 [id number]  . albuterol (PROVENTIL HFA;VENTOLIN HFA) 108 (90 BASE) MCG/ACT inhaler 2 puff  2 puff Inhalation Q6H PRN Derrill Center, NP   2 puff at 12/14/15 0802  . alum & mag hydroxide-simeth (MAALOX/MYLANTA) 200-200-20 MG/5ML suspension 30 mL  30 mL Oral Q4H PRN Derrill Center, NP   30 mL at 12/04/15 0225  . benztropine (COGENTIN) tablet 0.5 mg  0.5 mg Oral QHS Derrill Center, NP   0.5 mg at 12/13/15 2140  . feeding supplement  (ENSURE ENLIVE) (ENSURE ENLIVE) liquid 237 mL  237 mL Oral BID BM Tonnie Friedel, MD   237 mL at 12/14/15 0805  . gabapentin (NEURONTIN) capsule 300 mg  300 mg Oral BID Derrill Center, NP   300 mg at 12/14/15 0803  . haloperidol (HALDOL) tablet 10 mg  10 mg Oral QHS Ivyrose Hashman, MD      . lidocaine (LIDODERM) 5 % 1 patch  1 patch Transdermal Q24H [name], MD   1 patch at 12/14/15 1034  . LORazepam (ATIVAN) tablet 1 mg  1 mg Oral BID PRN [name], MD   1 mg at 12/14/15 0941  . losartan (COZAAR) tablet 50 mg  50 mg Oral Daily Derrill Center, NP   50 mg at 12/14/15 0803  . magnesium hydroxide (MILK OF MAGNESIA) suspension 30 mL  30 mL Oral Daily PRN Derrill Center, NP   30 mL at 12/05/15 0239  . mirtazapine (REMERON) tablet 15 mg  15 mg Oral QHS PRN Derrill Center, NP   15 mg at 12/13/15 2139  . nicotine (NICODERM CQ - dosed in mg/24 hours) patch 14 mg  14 mg Transdermal Daily [name], MD   14 mg at 12/14/15 0803  . OLANZapine (ZYPREXA) tablet 10 mg  10 mg Oral TID PRN [name], MD   10 mg at 12/13/15 0936   Or  . OLANZapine (ZYPREXA) injection 10 mg  10 mg Intramuscular TID PRN [name], MD      . Derrill Memo ON 01/10/2016] paliperidone (INVEGA SUSTENNA) injection 156 mg  156 mg Intramuscular Q28 days [name], MD      . pantoprazole (PROTONIX) EC tablet 40 mg  40 mg Oral Daily Derrill Center, NP   40 mg at 12/14/15 [id number]    Lab Results:  No results found for this or any previous visit (from the past 82 hour(s)).  Physical Findings: AIMS: Facial and Oral Movements Muscles of Facial Expression: None, normal Lips and Perioral Area: None, normal Jaw: None, normal Tongue: None, normal,Extremity Movements Upper (arms, wrists, hands, fingers): None, normal Lower (legs, knees, ankles, toes): None, normal, Trunk Movements Neck, shoulders, hips: None, normal, Overall Severity Severity of abnormal movements (highest score from questions above): None,  normal Incapacitation due to abnormal movements: None, normal Patient's awareness of abnormal movements (rate only patient's report): No Awareness, Dental Status Current problems with teeth and/or dentures?: No Does patient usually wear dentures?: No  CIWA:  CIWA-Ar Total: 10 COWS:  COWS Total Score: 8  Musculoskeletal: Strength & Muscle Tone: within normal limits Gait & Station: normal Patient leans: N/A  Psychiatric Specialty Exam: Review of Systems  Musculoskeletal: Negative.   Neurological: Negative for dizziness and tingling.  Psychiatric/Behavioral: Positive for hallucinations. The patient is nervous/anxious.  Paranoia and delusions  All other systems reviewed and are negative.   Blood pressure 108/82, pulse 108, temperature 98.6 F (37 C), temperature source Oral, resp. rate 16, height 5\' 10"  (1.778 m), weight 86.183 kg (190 lb).Body mass index is 27.26 kg/(m^2).  General Appearance: Fairly Groomed  Engineer, water::  Good   Speech:  Normal Rate  Volume:  varies   Mood:  Angry and Anxious  irritable  Affect:  Labile with some improvement  Thought Process:  Disorganized and Loose more organized than yesterday  Orientation:  Full (Time, Place, and Person)  Thought Content:  Delusions, Paranoid Ideation and Rumination    Suicidal Thoughts:  No  Homicidal Thoughts:  No  Memory:  Immediate;   Fair Recent;   Fair Remote;   Poor  Judgement:  Impaired  Insight:  Shallow  Psychomotor Activity:  Restlessness  Concentration:  Poor  Recall:  Poor  Fund of Knowledge:Poor  Language: Fair  Akathisia:  No  Handed:  Right  AIMS (if indicated):     Assets:  Social Support Others:  access to health care  ADL's:  Intact  Cognition: WNL   Sleep:  Number of Hours: 6.75   Treatment Plan Summary:Patient presented disorganized ,delusional.Pt today is seen as less irritable, less labile ,will continue treatment and support.     Daily contact with patient to assess and  evaluate symptoms and progress in treatment and Medication management   Pt received Invega sustenna IM-  234 mg x 1 dose given 12/08/15,  Invega sustenna 156 mg IM  -  12/12/15. AIMS - 0 ( 12/12/15) Pt is currently restarted on Haldol , dose increased to 10 mg po qhs today for augmenting the effect of Invega. Pt on admission was on two antipsychotics- although he was not compliant.  Will continue Cogentin 0.5 mg po qhs for EPS. Will continue Gabapentin  300 mg po bid for anxiety sx. Will continue Remeron 15 mg po qhs for affective sx as well as sleep. Will continue to monitor vitals ,medication compliance and treatment side effects while patient is here.  Will monitor for medical issues as well as call consult as needed.   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 Pt also has a hx of CKD- Creatinine chronically elevated.  TSH - wnl ,lipid panel- wnl ,  hba1c- wnl , PL is elevated - likely 2/2 neuroleptics- will monitor. Vitamin b12- wnl, folate - wnl.  CSW will start working on disposition. Pt to be referred back to ACTT - once stable. Continue Recreational therapy consult . Patient to participate in therapeutic milieu .       Ollis Daudelin MD 12/14/2015, 3:23 PM

## 2015-12-15 MED ORDER — MENTHOL 3 MG MT LOZG
1.0000 | LOZENGE | OROMUCOSAL | Status: DC | PRN
  Administered 2015-12-15 – 2015-12-19 (×3): 3 mg via ORAL

## 2015-12-15 NOTE — Progress Notes (Signed)
Patient ID: David [name], male   DOB: 04-22-1956, 59 y.o.   MRN: [id number] D: Client is visible on the unit, not as intrusive today, easily redirected, smiles at times. A: Writer reviewed medications administered as ordered, encouraged karaoke. Staff will monitor q64min for safety. R:Client is safe on the unit, attended karaoke, reports he enjoyed it.

## 2015-12-15 NOTE — Plan of Care (Signed)
Problem: Consults Goal: Psychosis Patient Education See Patient Education Module for education specifics.  Outcome: Progressing Nurse discussed depression/coping skills with patient.

## 2015-12-15 NOTE — Tx Team (Signed)
Interdisciplinary Treatment Plan Update (Adult)  Date:  12/15/2015   Time Reviewed:  3:15 PM   Progress in Treatment: Attending groups: Yes. Participating in groups:  Yes. Taking medication as prescribed:  Yes. Tolerating medication:  Yes. Family/Significant other contact made: Yes  ACT team and parents Patient understands diagnosis:  No  Limited insight Discussing patient identified problems/goals with staff:  Yes, see initial care plan. Medical problems stabilized or resolved:  Yes. Denies suicidal/homicidal ideation: Yes. Issues/concerns per patient self-inventory:  No. Other:  New problem(s) identified:  Discharge Plan or Barriers: see below  Reason for Continuation of Hospitalization: Medication stabilization Disorganization, Flight of ideas  Comments:  Patient presents disorganized , has limited insight, is a poor historian. Collateral information was obtained from ACTT. Will continue medication readjustment. Will cross taper Zyprexa with Invega . Plan to DC pt on Invega sustenna IM - if he tolerates it well. Will not restart Haldol at this time. Will continue Cogentin 0.5 mg po qhs for EPS. Will continue Gabapentin 300 mg po bid for anxiety sx. Will continue Remeron 15 mg po qhs for affective sx as well as sleep.  12/08/15: Patient continues to present as disorganized ,delusional. Pt also has labile mood, requires a lot of redirection on the unit. Will continue medication readjustment.  Will increase Invega to 9 mg po qhs. Plan to DC pt on Invega sustenna IM - if he tolerates it well. Will provide Invega sustenna 234 mg IM tomorrow. Will not restart Haldol at this time. Will continue Cogentin 0.5 mg po qhs for EPS. Will continue Gabapentin 300 mg po bid for anxiety sx. Will continue Remeron 15 mg po qhs for affective sx as well as sleep.  12/13/15: Will continue Invega sustenna IM- 234 mg x 1 dose given 12/08/15, Invega sustenna 156 mg IM to be given on 12/12/15.  AIMS - 0 ( 12/12/15) Will discontinue Invega po . Will restart Haldol - start with a low dose of 5 mg po qhs for augmenting the effect of Invega sustenna . Will continue Cogentin 0.5 mg po qhs for EPS. Will continue Gabapentin 300 mg po bid for anxiety sx. Will continue Remeron 15 mg po qhs for affective sx as well as sleep.  12/15/15: Pt is currently restarted on Haldol , dose increased to 10 mg po qhs today for augmenting the effect of Invega. Pt on admission was on two antipsychotics- although he was not compliant.  Will continue Cogentin 0.5 mg po qhs for EPS. Will continue Gabapentin 300 mg po bid for anxiety sx. Will continue Remeron 15 mg po qhs for affective sx as well as sleep.  Estimated length of stay: 4-5 days  New goal(s):  Review of initial/current patient goals per problem list:   Review of initial/current patient goals per problem list:  1. Goal(s): Patient will participate in aftercare plan   Met: Yes   Target date: 3-5 days post admission date   As evidenced by: Patient will participate within aftercare plan AEB aftercare provider and housing plan at discharge being identified.  12/06/15: Return home, follow up outpt    5. Goal(s): Patient will demonstrate decreased signs of psychosis  * Met: Progressing  * Target date: 3-5 days post admission date  * As evidenced by: Patient will demonstrate decreased frequency of AVH or return to baseline function 12/06/15:  Bizarre, disorganized, poor historian 12/08/15:  Speech is now clearer, intelligible, and pt is more goal directed.  However, remains disorganized, lacking insight and displaying mood  lability.  Upset tha he cannot go home on pass to get his shoes 12/13/15:  Continues to be disorganized, tangential with flight of ideas 12/15/15:  Is now thinking more clearly, expressing self well.  Less mood lability. Tends to be grumpier in the AM          Attendees: Patient:  12/15/2015 3:15 PM    Family:   12/15/2015 3:15 PM   Physician:  [name], MD 12/15/2015 3:15 PM   Nursing:   Grayland Ormond, RN 12/15/2015 3:15 PM   CSW:    Roque Lias, [place]   12/15/2015 3:15 PM   Other:  12/15/2015 3:15 PM   Other:   12/15/2015 3:15 PM   Other:  Lars Pinks, Nurse CM 12/15/2015 3:15 PM   Other:   12/15/2015 3:15 PM   Other:  Norberto Sorenson, [place]  12/15/2015 3:15 PM   Other:  12/15/2015 3:15 PM   Other:  12/15/2015 3:15 PM   Other:  12/15/2015 3:15 PM   Other:  12/15/2015 3:15 PM   Other:  12/15/2015 3:15 PM   Other:   12/15/2015 3:15 PM    Scribe for Treatment Team:   Trish Mage, 12/15/2015 3:15 PM

## 2015-12-15 NOTE — [facility] Group Notes (Signed)

## 2015-12-15 NOTE — Progress Notes (Signed)
[facility] MD Progress Note  12/15/2015 2:07 PM Kiowa Maiorana  MRN:  [id number]   Subjective: Patient states "I am fine. I need a smoke. I am tired of the oriental communist who is having warfare with [place]."      Objective: Patient is a 60 y old CM , who has a hx of schizophrenia , who presented to [place]- for hearing Council as well as asking to go to [facility].  Patient seen and chart reviewed.Discussed patient with treatment team.  Pt today continues to be labile - seen as loud, yelling on and off , mostly about not being discharged home today. Pt continues to be delusional and disorganized , although more redirectable than previous days. Pt per nursing pt continues to be tangential and disorganized on and off , with periods when he is more calm and organized. Pt has been compliant on his medications. Denies side effects.Will continue to encourage pt to attend groups and participate.    Per collateral information obtained from ACTT- Pt has a chronic hx of schizophrenia , was at [facility] recently - stayed there for 15 month and was very depressed, catatonic during that admission . Pt was doing well for a while after discharge  , but then started decompensating. Pt was noncompliant on his PO medications - was discharged on Haldol decanoate IM.  Principal Problem: Paranoid schizophrenia ([place]) Diagnosis:   Patient Active Problem List   Diagnosis Date Noted  . 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 History of idiopathic thrombocytopenic purpura [Z86.2] 12/05/2015  . Paranoid schizophrenia ([place]) [F20.0] 12/03/2015  . HTN (hypertension) [I10] 03/04/2012  . GERD (gastroesophageal reflux disease) [K21.9] 03/04/2012  . Insomnia [G47.00] 03/04/2012  . CKD (chronic kidney disease) stage 2, GFR 60-89 ml/min [N18.2] 03/04/2012  . Schizophrenia, paranoid, chronic ([place]) [F20.0] 02/18/2012   Total Time spent with patient: 25 minutes  Past Psychiatric History: Pt was admitted to [facility] - schizophrenia - in 2013,  was discharged on Haldol decanoate IM - and was referred to [facility].Pt also had recent hospitalization at [facility] - April 2016. Pt has an ACTT.  Past Medical History:  Past Medical History  Diagnosis Date  . Schizophrenia ([place])   . Pneumonia   . Anxiety   . GERD (gastroesophageal reflux disease)   . COPD (chronic obstructive pulmonary disease) [facility])     Past Surgical History  Procedure Laterality Date  . Tonsillectomy     Family History:  Family History  Problem Relation Age of Onset  . Arthritis Mother   . Hypertension Mother    Family Psychiatric  History: Pt did not express any hx of mental illness in family. Social History: Pt lives by self in [place], [place]. Pt is single. History  Alcohol Use No     History  Drug Use No    Comment: denies    Social History   Social History  . Marital Status: Legally Separated    Spouse Name: N/A  . Number of Children: N/A  . Years of Education: N/A   Social History Main Topics  . Smoking status: Current Every Day Smoker -- 1.00 packs/day for 40 years    Types: Cigarettes    Start date: 04/14/1974  . Smokeless tobacco: Never Used  . Alcohol Use: No  . Drug Use: No     Comment: denies  . Sexual Activity: No   Other Topics Concern  . None   Social History Narrative   Additional Social History:   Sleep: Fair  Appetite:  Fair  Current Medications: Current Facility-Administered Medications  Medication Dose Route Frequency Provider Last Rate Last Dose  . acetaminophen (TYLENOL) tablet 650 mg  650 mg Oral Q6H PRN Derrill Center, NP   650 mg at 12/15/15 0839  . albuterol (PROVENTIL HFA;VENTOLIN HFA) 108 (90 BASE) MCG/ACT inhaler 2 puff  2 puff Inhalation Q6H PRN Derrill Center, NP   2 puff at 12/14/15 0802  . alum & mag hydroxide-simeth (MAALOX/MYLANTA) 200-200-20 MG/5ML suspension 30 mL  30 mL Oral Q4H PRN Derrill Center, NP   30 mL at 12/04/15 0225  . benztropine (COGENTIN) tablet 0.5 mg  0.5 mg Oral QHS Derrill Center, NP    0.5 mg at 12/14/15 2131  . feeding supplement (ENSURE ENLIVE) (ENSURE ENLIVE) liquid 237 mL  237 mL Oral BID BM Talissa Apple, MD   237 mL at 12/15/15 1100  . gabapentin (NEURONTIN) capsule 300 mg  300 mg Oral BID Derrill Center, NP   300 mg at 12/15/15 0800  . haloperidol (HALDOL) tablet 10 mg  10 mg Oral QHS [name], MD   10 mg at 12/14/15 2131  . lidocaine (LIDODERM) 5 % 1 patch  1 patch Transdermal Q24H [name], MD   1 patch at 12/15/15 1105  . LORazepam (ATIVAN) tablet 1 mg  1 mg Oral BID PRN [name], MD   1 mg at 12/14/15 0941  . losartan (COZAAR) tablet 50 mg  50 mg Oral Daily Derrill Center, NP   50 mg at 12/15/15 0800  . magnesium hydroxide (MILK OF MAGNESIA) suspension 30 mL  30 mL Oral Daily PRN Derrill Center, NP   30 mL at 12/05/15 0239  . menthol-cetylpyridinium (CEPACOL) lozenge 3 mg  1 lozenge Oral PRN [name], MD      . mirtazapine (REMERON) tablet 15 mg  15 mg Oral QHS PRN Derrill Center, NP   15 mg at 12/14/15 2147  . nicotine (NICODERM CQ - dosed in mg/24 hours) patch 14 mg  14 mg Transdermal Daily [name], MD   14 mg at 12/15/15 0801  . OLANZapine (ZYPREXA) tablet 10 mg  10 mg Oral TID PRN [name], MD   10 mg at 12/13/15 0936   Or  . OLANZapine (ZYPREXA) injection 10 mg  10 mg Intramuscular TID PRN [name], MD      . Derrill Memo ON 01/10/2016] paliperidone (INVEGA SUSTENNA) injection 156 mg  156 mg Intramuscular Q28 days [name], MD      . pantoprazole (PROTONIX) EC tablet 40 mg  40 mg Oral Daily Derrill Center, NP   40 mg at 12/15/15 0800    Lab Results:  No results found for this or any previous visit (from the past 58 hour(s)).  Physical Findings: AIMS: Facial and Oral Movements Muscles of Facial Expression: None, normal Lips and Perioral Area: None, normal Jaw: None, normal Tongue: None, normal,Extremity Movements Upper (arms, wrists, hands, fingers): None, normal Lower (legs, knees, ankles, toes): None, normal,  Trunk Movements Neck, shoulders, hips: None, normal, Overall Severity Severity of abnormal movements (highest score from questions above): None, normal Incapacitation due to abnormal movements: None, normal Patient's awareness of abnormal movements (rate only patient's report): No Awareness, Dental Status Current problems with teeth and/or dentures?: No Does patient usually wear dentures?: No  CIWA:  CIWA-Ar Total: 10 COWS:  COWS Total Score: 8  Musculoskeletal: Strength & Muscle Tone: within normal limits Gait & Station: normal Patient leans: N/A  Psychiatric Specialty Exam: Review of Systems  Musculoskeletal: Negative.   Neurological: Negative for dizziness and tingling.  Psychiatric/Behavioral: The patient is nervous/anxious.        Paranoia and delusions  All other systems reviewed and are negative.   Blood pressure 131/97, pulse 106, temperature 98.9 F (37.2 C), temperature source Oral, resp. rate 20, height 5\' 10"  (1.778 m), weight 86.183 kg (190 lb).Body mass index is 27.26 kg/(m^2).  General Appearance: Fairly Groomed  Engineer, water::  Good   Speech:  Normal Rate  Volume:  varies   Mood:  Angry and Anxious  Irritable, with some improvement  Affect:  Labile with some improvement  Thought Process:  Disorganized and Loose more organized than yesterday  Orientation:  Full (Time, Place, and Person)  Thought Content:  Delusions, Paranoid Ideation and Rumination    Suicidal Thoughts:  No  Homicidal Thoughts:  reports homicidal thoughts against the oriental communist monkeys  Memory:  Immediate;   Fair Recent;   Fair Remote;   Poor  Judgement:  Impaired  Insight:  Shallow  Psychomotor Activity:  Restlessness  Concentration:  Poor  Recall:  Poor  Fund of Knowledge:Poor  Language: Fair  Akathisia:  No  Handed:  Right  AIMS (if indicated):     Assets:  Social Support Others:  access to health care  ADL's:  Intact  Cognition: WNL   Sleep:  Number of Hours: 5.25    Treatment Plan Summary:Patient presented disorganized ,delusional.Pt today is seen as less irritable, less labile , showing progress ,will continue treatment and support.     Daily contact with patient to assess and evaluate symptoms and progress in treatment and Medication management   Pt received Invega sustenna IM-  234 mg x 1 dose given 12/08/15,  Invega sustenna 156 mg IM  -  12/12/15. AIMS - 0 ( 12/12/15) Pt is currently restarted on Haldol , dose increased to 10 mg po qhs today for augmenting the effect of Invega. Pt on admission was on two antipsychotics- although he was not compliant.  Will continue Cogentin 0.5 mg po qhs for EPS. Will continue Gabapentin  300 mg po bid for anxiety sx. Will continue Remeron 15 mg po qhs for affective sx as well as sleep. Will continue to monitor vitals ,medication compliance and treatment side effects while patient is here.  Will monitor for medical issues as well as call consult as needed.   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 Pt also has a hx of CKD- Creatinine chronically elevated.  TSH - wnl ,lipid panel- wnl ,  hba1c- wnl , PL is elevated - likely 2/2 neuroleptics- will monitor. Vitamin b12- wnl, folate - wnl.  CSW will start working on disposition. Pt to be referred back to ACTT - once stable. Continue Recreational therapy consult . Patient to participate in therapeutic milieu .       Jalasia Eskridge MD 12/15/2015, 2:07 PM

## 2015-12-15 NOTE — Progress Notes (Signed)
D:  Patient's self inventory sheet, patient slept good last night, sleep medication was helpful.  Fair appetite, normal energy level, good concentration.  Rated depression 1, hopeless 2, anxiety 2.  Denied withdrawals.  Denied SI.  Denied physical problems.  Denied pain.  Goal is to communicate with government.  Does have discharge plans.  People may hinder him after discharge. A:  Medications administered per MD orders.  Emotional support and encouragement given patient. R:  Denied SI and HI, contracts for safety.  Denied A/V hallucinations.  Denied pain.  Safety maintained with 15 minute checks.

## 2015-12-15 NOTE — BHH Group Notes (Signed)
[facility] LCSW Group Therapy  12/15/2015 3:13 PM   Type of Therapy:  Group Therapy  Participation Level:  Active  Participation Quality:  Attentive  Affect:  Appropriate  Cognitive:  Appropriate  Insight:  Improving  Engagement in Therapy:  Engaged  Modes of Intervention:  Clarification, Education, Exploration and Socialization  Summary of Progress/Problems: Today's group focused on relapse prevention.  We defined the term, and then brainstormed on ways to prevent relapse.  Stayed the entire time.  Minimal interruptions.  Usually raised his hand and waited.  No disorganized statements.  Talked about the importance of taking meds, and he agreed that he was in all likely hood not making much sense when he came in to the hospital.  Mood good.  Roque Lias B 12/15/2015 , 3:13 PM

## 2015-12-15 NOTE — Progress Notes (Signed)
Patient ID: David [name], male   DOB: [birth year], 59 y.o.   MRN: [id number] PER STATE REGULATIONS 482.30  THIS CHART WAS REVIEWED FOR MEDICAL NECESSITY WITH RESPECT TO THE PATIENT'S ADMISSION/ DURATION OF STAY.  NEXT REVIEW DATE:   12/19/2015  Chauncy Lean, RN, BSN CASE MANAGER

## 2015-12-15 NOTE — Progress Notes (Signed)
Recreation Therapy Notes  12.15.2016 @ approximately 2:30pm LRT returned with cup stack activity. Patient again attempted to build tower, today in contrast to yesterday patient was able to build a pyramid, but still failed at building a 12" tower. Patient spoke about the "orientals" coming during 1:1. Patient spoke about "the Orientals" in an agitated tone, LRT inquired if they were here to hurt [place], patient responded "Well got damn girl, we're at war what do you think!"    [name], LRT/CTRS   [name] 12/15/2015 3:42 PM

## 2015-12-16 NOTE — BHH Group Notes (Signed)
[facility] LCSW Aftercare Discharge Planning Group Note   12/16/2015 11:00 AM  Participation Quality:  Invited.  Declined to attend.    Roque Lias B

## 2015-12-16 NOTE — Progress Notes (Signed)
Recreation Therapy Notes  12.16.2016 @ approximately 2:45pm. LRT returned to work with patient, LRT brought landing pad activity. Activity requires participant to build a landing pad that will catch a golf ball. Ball dropped from approximately 3 feet high. When patient saw materials he asked if he was building a teepee. LRT encouraged patient to follow activity instructions, however patient built a teepee and explained construction of teepee to LRT. Patient appeared to enjoy the individual attention vs being truly vested in 1:1 session, as he stopped frequently to tell stories or share information with LRT. Patient spoke about his friends "Veda and [employer]" patient explained they are not human, when LRT asked what they were patient simply stated "my friends." Patient explained that Christella Scheuermann is the ruler of the "[place]." Patient additionally shared that cartoon characters, such as Archivist, Optimus Prime and Cytogeneticist are part of the [place]. Patient explained the [place] has a Civil Service fast streamer and "if you don't stay in your lane they will attack and kill you." Patient also shared that he has 3 princes, named "Boy, Mony and Trash." After speaking about the princes patient stated "Now you close the book on them" demonstrated closing a book and demanded that LRT do the same. LRT folded her piece of paper to satisfy patient.   Laureen Ochs Mayra Jolliffe, LRT/CTRS           [name] 12/16/2015 3:27 PM

## 2015-12-16 NOTE — Progress Notes (Addendum)
Pt refuses to answer by the name David [name] and wants to be called David [name]. He is easily agitated this am and can be confrontational at times. Pt denies Si or HI. He refused to go to breakfast this am due another pt on the 300 hall. He did not like the way the pt looked. Pt is dishelved and was encouraged to shower but declined. Pt stated he vomitted all his am medications. MD made  Aware. Pt was given gatroade to drink due to an elevated heart rate. Lung sound clear in all fields. Pt pulse ox is 98% on room air. Pt stated,'I think I have pneumonia." MD aware. Pt rates his depression a 2/10 and hopelessness a 0/10. Pt becomes extremely agitated when he is not called [name]. Pt was asked to leave group with the chaplain and social worker due to his disruptive behavior. Pt came in the hall yelling stating, "I can speak to a chaplain if I want to." Dr. Shea Evans requested that the pt go to his room. Pt asked for assistance with shaving. Tech, [place] assisted the pt.

## 2015-12-16 NOTE — BHH Group Notes (Signed)
[facility] LCSW Group Therapy  12/16/2015  1:05 PM  Type of Therapy:  Group therapy  Participation Level:  Active  Participation Quality:  Attentive  Affect:  Flat  Cognitive:  Oriented  Insight:  Limited  Engagement in Therapy:  Limited  Modes of Intervention:  Discussion, Socialization  Summary of Progress/Problems:  Chaplain was here to lead a group on themes of hope and courage.  "I don't like the oriental communists that are here to take over.  They need to be eliminated.  I like Behavioral Health so far.  I give them a passing grade."  Continued to have a side conversation with another patient.  Redirected several times.  He got angry and told me he it was his right to talk to the "chaplain."  Excused himself while yelling.  Roque Lias B 12/16/2015 1:27 PM

## 2015-12-16 NOTE — Progress Notes (Addendum)
Was reported to this writer Patient has been acting aggressive to another patient on different unit in the cafeteria. Was reported patient is staring and waking up to other patient and giving the middle finger.  Will continue to monitor behaviors on and off unit.

## 2015-12-16 NOTE — Progress Notes (Addendum)
[facility] MD Progress Note  12/16/2015 2:05 PM David [name]  MRN:  [id number]   Subjective: Patient states "I am fine. I need medical marijuana.'      Objective: Patient is a 67 y old CM , who has a hx of schizophrenia , who presented to [facility]- for hearing AH as well as asking to go to [facility].  Patient seen and chart reviewed.Discussed patient with treatment team.  Pt today seen as more subdued than previous days , however has periods when he is seen as loud , yelling on the unit , requiring prn redirection.  Pt continues to be delusional about communists and being at war with the orientals . Pt per nursing pt continues to be tangential and disorganized on and off , with periods when he is more calm and organized. Pt has been compliant on his medications. Denies side effects.Will continue to encourage pt to attend groups and participate.    Per collateral information obtained from ACTT- Pt has a chronic hx of schizophrenia , was at [facility] recently - stayed there for 15 month and was very depressed, catatonic during that admission . Pt was doing well for a while after discharge  , but then started decompensating. Pt was noncompliant on his PO medications - was discharged on Haldol decanoate IM.  Principal Problem: Paranoid schizophrenia ([place]) Diagnosis:   Patient Active Problem List   Diagnosis Date Noted  . 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 History of idiopathic thrombocytopenic purpura [Z86.2] 12/05/2015  . Paranoid schizophrenia ([place]) [F20.0] 12/03/2015  . HTN (hypertension) [I10] 03/04/2012  . GERD (gastroesophageal reflux disease) [K21.9] 03/04/2012  . Insomnia [G47.00] 03/04/2012  . CKD (chronic kidney disease) stage 2, GFR 60-89 ml/min [N18.2] 03/04/2012  . Schizophrenia, paranoid, chronic ([place]) [F20.0] 02/18/2012   Total Time spent with patient: 25 minutes  Past Psychiatric History: Pt was admitted to [facility] - schizophrenia - in 2013, was discharged on Haldol decanoate IM  - and was referred to [facility].Pt also had recent hospitalization at [facility] - April 2016. Pt has an ACTT.  Past Medical History:  Past Medical History  Diagnosis Date  . Schizophrenia ([place])   . Pneumonia   . Anxiety   . GERD (gastroesophageal reflux disease)   . COPD (chronic obstructive pulmonary disease) [facility])     Past Surgical History  Procedure Laterality Date  . Tonsillectomy     Family History:  Family History  Problem Relation Age of Onset  . Arthritis Mother   . Hypertension Mother    Family Psychiatric  History: Pt did not express any hx of mental illness in family. Social History: Pt lives by self in [place], [place]. Pt is single. History  Alcohol Use No     History  Drug Use No    Comment: denies    Social History   Social History  . Marital Status: Legally Separated    Spouse Name: N/A  . Number of Children: N/A  . Years of Education: N/A   Social History Main Topics  . Smoking status: Current Every Day Smoker -- 1.00 packs/day for 40 years    Types: Cigarettes    Start date: 04/14/1974  . Smokeless tobacco: Never Used  . Alcohol Use: No  . Drug Use: No     Comment: denies  . Sexual Activity: No   Other Topics Concern  . None   Social History Narrative   Additional Social History:   Sleep: Fair  Appetite:  Fair  Current Medications: Current  Facility-Administered Medications  Medication Dose Route Frequency Provider Last Rate Last Dose  . acetaminophen (TYLENOL) tablet 650 mg  650 mg Oral Q6H PRN Derrill Center, NP   650 mg at 12/15/15 1826  . albuterol (PROVENTIL HFA;VENTOLIN HFA) 108 (90 BASE) MCG/ACT inhaler 2 puff  2 puff Inhalation Q6H PRN Derrill Center, NP   2 puff at 12/14/15 0802  . alum & mag hydroxide-simeth (MAALOX/MYLANTA) 200-200-20 MG/5ML suspension 30 mL  30 mL Oral Q4H PRN Derrill Center, NP   30 mL at 12/04/15 0225  . benztropine (COGENTIN) tablet 0.5 mg  0.5 mg Oral QHS Derrill Center, NP   0.5 mg at 12/15/15 2144  . feeding  supplement (ENSURE ENLIVE) (ENSURE ENLIVE) liquid 237 mL  237 mL Oral BID BM Serafin [place], MD   237 mL at 12/16/15 1037  . gabapentin (NEURONTIN) capsule 300 mg  300 mg Oral BID Derrill Center, NP   300 mg at 12/16/15 0820  . haloperidol (HALDOL) tablet 10 mg  10 mg Oral QHS [name], MD   10 mg at 12/15/15 2144  . lidocaine (LIDODERM) 5 % 1 patch  1 patch Transdermal Q24H [name], MD   1 patch at 12/16/15 1301  . LORazepam (ATIVAN) tablet 1 mg  1 mg Oral BID PRN [name], MD   1 mg at 12/15/15 2248  . losartan (COZAAR) tablet 50 mg  50 mg Oral Daily Derrill Center, NP   50 mg at 12/16/15 0820  . magnesium hydroxide (MILK OF MAGNESIA) suspension 30 mL  30 mL Oral Daily PRN Derrill Center, NP   30 mL at 12/05/15 0239  . menthol-cetylpyridinium (CEPACOL) lozenge 3 mg  1 lozenge Oral PRN [name], MD   3 mg at 12/15/15 1828  . mirtazapine (REMERON) tablet 15 mg  15 mg Oral QHS PRN Derrill Center, NP   15 mg at 12/15/15 2144  . nicotine (NICODERM CQ - dosed in mg/24 hours) patch 14 mg  14 mg Transdermal Daily [name], MD   14 mg at 12/16/15 0823  . OLANZapine (ZYPREXA) tablet 10 mg  10 mg Oral TID PRN [name], MD   10 mg at 12/13/15 0936   Or  . OLANZapine (ZYPREXA) injection 10 mg  10 mg Intramuscular TID PRN [name], MD      . Derrill Memo ON 01/10/2016] paliperidone (INVEGA SUSTENNA) injection 156 mg  156 mg Intramuscular Q28 days [name], MD      . pantoprazole (PROTONIX) EC tablet 40 mg  40 mg Oral Daily Derrill Center, NP   40 mg at 12/16/15 0820    Lab Results:  No results found for this or any previous visit (from the past 73 hour(s)).  Physical Findings: AIMS: Facial and Oral Movements Muscles of Facial Expression: None, normal Lips and Perioral Area: None, normal Jaw: None, normal Tongue: None, normal,Extremity Movements Upper (arms, wrists, hands, fingers): None, normal Lower (legs, knees, ankles, toes): None, normal, Trunk  Movements Neck, shoulders, hips: None, normal, Overall Severity Severity of abnormal movements (highest score from questions above): None, normal Incapacitation due to abnormal movements: None, normal Patient's awareness of abnormal movements (rate only patient's report): No Awareness, Dental Status Current problems with teeth and/or dentures?: No Does patient usually wear dentures?: No  CIWA:  CIWA-Ar Total: 1 COWS:  COWS Total Score: 3  Musculoskeletal: Strength & Muscle Tone: within normal limits Gait & Station: normal Patient leans: N/A  Psychiatric  Specialty Exam: Review of Systems  Musculoskeletal: Negative.   Neurological: Negative for dizziness and tingling.  Psychiatric/Behavioral: The patient is nervous/anxious.        Paranoia and delusions  All other systems reviewed and are negative.   Blood pressure 158/100, pulse 103, temperature 98 F (36.7 C), temperature source Oral, resp. rate 18, height 5\' 10"  (1.778 m), weight 86.183 kg (190 lb).Body mass index is 27.26 kg/(m^2).  General Appearance: Fairly Groomed  Engineer, water::  Good   Speech:  Normal Rate  Volume:  varies   Mood:  Angry and Anxious  Irritable- improving  Affect:  Labile with some improvement  Thought Process:  Disorganized and Loose more organized than yesterday  Orientation:  Full (Time, Place, and Person)  Thought Content:  Delusions, Paranoid Ideation and Rumination    Suicidal Thoughts:  No  Homicidal Thoughts:  No  Memory:  Immediate;   Fair Recent;   Fair Remote;   Poor  Judgement:  Impaired  Insight:  Shallow  Psychomotor Activity:  Normal  Concentration:  Poor  Recall:  [employer] of Knowledge:Fair  Language: Fair  Akathisia:  No  Handed:  Right  AIMS (if indicated):     Assets:  Social Support Others:  access to health care  ADL's:  Intact  Cognition: WNL   Sleep:  Number of Hours: 5.75   Treatment Plan Summary:Patient presented disorganized ,delusional.Pt today is seen as less  irritable, less labile , showing progress ,will continue treatment and support.     Daily contact with patient to assess and evaluate symptoms and progress in treatment and Medication management   Pt received Invega sustenna IM-  234 mg x 1 dose given 12/08/15,  Invega sustenna 156 mg IM  -  12/12/15. AIMS - 0 ( 12/12/15) Pt is currently restarted on Haldol , dose increased to 10 mg po qhs  for augmenting the effect of Invega. Pt on admission was on two antipsychotics- although he was not compliant.  Will continue Cogentin 0.5 mg po qhs for EPS. Will continue Gabapentin  300 mg po bid for anxiety sx. Will continue Remeron 15 mg po qhs for affective sx as well as sleep. Will continue to monitor vitals ,medication compliance and treatment side effects while patient is here.  Will monitor for medical issues as well as call consult as needed.   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 Pt also has a hx of CKD- Creatinine chronically elevated.  TSH - wnl ,lipid panel- wnl ,  hba1c- wnl , PL is elevated - likely 2/2 neuroleptics- will monitor. Vitamin b12- wnl, folate - wnl.  CSW will start working on disposition. Pt to be referred back to ACTT - once stable. Continue Recreational therapy consult . Patient to participate in therapeutic milieu .    I certify that the services received since the previous certification/recertification were and continue to be medically necessary as the treatment provided can be reasonably expected to improve the patient's condition; the medical record documents that the services furnished were intensive treatment services or their equivalent services, and this patient continues to need, on a daily basis, active treatment furnished directly by or requiring the supervision of inpatient psychiatric personnel.      Rozalia Dino MD 12/16/2015, 2:05 PM

## 2015-12-16 NOTE — Progress Notes (Signed)
[facility] Group Notes:  (Nursing/MHT/Case Management/Adjunct)  Date:  12/16/2015  Time:  10:13 PM  Type of Therapy:  Psychoeducational Skills  Participation Level:  Active  Participation Quality:  Inattentive and Resistant  Affect:  Angry and Labile  Cognitive:  Lacking  Insight:  Lacking  Engagement in Group:  Distracting and Off Topic  Modes of Intervention:  Education  Summary of Progress/Problems: The patient spoke out of turn on multiple occasions and cursed while his peers were sharing. The patient was unable to talk about his day or to discuss what his coping skill will be. He left group early this evening.   Archie Balboa S 12/16/2015, 10:13 PM

## 2015-12-17 MED ORDER — HYDROXYZINE HCL 25 MG PO TABS
25.0000 mg | ORAL_TABLET | Freq: Every evening | ORAL | Status: DC | PRN
  Administered 2015-12-17 – 2015-12-29 (×22): 25 mg via ORAL
  Filled 2015-12-17 (×37): qty 1

## 2015-12-17 NOTE — Progress Notes (Signed)
Patient ID: David [name], male   DOB: October 08, 1956, 59 y.o.   MRN: [id number] D: Client visible on the unit, has moments of clarity then rambles about different races of people, pipelines etc. Client is demanding, animated at times, "give me that anxiety medicine" A: Writer continues to redirect client, reviewed medication, administered as prescribed. Staff will monitor q61min for safety. R: client is safe on the unit, attended group.

## 2015-12-17 NOTE — Progress Notes (Signed)
Pt slept through breakfast. He awoke and stated ,"I am not sleeping good." pt appears disheleved this am and was cursing on the phone with a family member. He is obsesses with communism and monkey's stating,"I don't like any of those funny looking people." He does deny SI and HI and contracts for safety. Pt rates his depression a 2/10 today and anxiety a 2/10. He stated the most important goal today is," politics  , Games developer and the chain gang. "Pt denies any auditory or visual hallucinations.

## 2015-12-17 NOTE — Progress Notes (Signed)
D: Pt denies SI/HI/AVH. Pt is pleasant and cooperative. Pt was observed sleeping in his room earlier this evening. Pt was given his medications and asked to go in his room to lay down. Pt stayed in the dayroom talking to a peer and did not go to his room until 2300  A: Pt was offered support and encouragement. Pt was given scheduled medications. Pt was encourage to attend groups. Q 15 minute checks were done for safety.   R:Pt attends groups and interacts well with peers and staff. Pt is taking medication. Pt has no complaints at this time .Pt receptive to treatment and safety maintained on unit.

## 2015-12-17 NOTE — Progress Notes (Signed)
[facility] MD Progress Note  12/17/2015 1:52 PM David [name]  MRN:  [id number]   Subjective: Patient states "I am not sleeping good at night."   Objective:David [name] is awake, alert and oriented X3 , found resting in bed.  Denies suicidal or homicidal ideation. Stats he has auditory or visual hallucination at times. and he does appear to be responding to internal stimuli. Patient reports he is medication compliant without mediation side effects. Patient states "I am not able to sleep at night"   Reports good appetite. Support, encouragement and reassurance was provided.   Principal Problem: Paranoid schizophrenia ([place]) Diagnosis:   Patient Active Problem List   Diagnosis Date Noted  . 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 History of idiopathic thrombocytopenic purpura [Z86.2] 12/05/2015  . Paranoid schizophrenia ([place]) [F20.0] 12/03/2015  . HTN (hypertension) [I10] 03/04/2012  . GERD (gastroesophageal reflux disease) [K21.9] 03/04/2012  . Insomnia [G47.00] 03/04/2012  . CKD (chronic kidney disease) stage 2, GFR 60-89 ml/min [N18.2] 03/04/2012  . Schizophrenia, paranoid, chronic ([place]) [F20.0] 02/18/2012   Total Time spent with patient: 25 minutes  Past Psychiatric History: Pt was admitted to [facility] - schizophrenia - in 2013, was discharged on Haldol decanoate IM - and was referred to [facility].Pt also had recent hospitalization at [facility] - April 2016. Pt has an ACTT.  Past Medical History:  Past Medical History  Diagnosis Date  . Schizophrenia ([place])   . Pneumonia   . Anxiety   . GERD (gastroesophageal reflux disease)   . COPD (chronic obstructive pulmonary disease) [facility])     Past Surgical History  Procedure Laterality Date  . Tonsillectomy     Family History:  Family History  Problem Relation Age of Onset  . Arthritis Mother   . Hypertension Mother    Family Psychiatric  History: Pt did not express any hx of mental illness in family. Social History: Pt lives by self in  [place], [place]. Pt is single. History  Alcohol Use No     History  Drug Use No    Comment: denies    Social History   Social History  . Marital Status: Legally Separated    Spouse Name: N/A  . Number of Children: N/A  . Years of Education: N/A   Social History Main Topics  . Smoking status: Current Every Day Smoker -- 1.00 packs/day for 40 years    Types: Cigarettes    Start date: 04/14/1974  . Smokeless tobacco: Never Used  . Alcohol Use: No  . Drug Use: No     Comment: denies  . Sexual Activity: No   Other Topics Concern  . None   Social History Narrative   Additional Social History:   Sleep: Fair  Appetite:  Fair  Current Medications: Current Facility-Administered Medications  Medication Dose Route Frequency Provider Last Rate Last Dose  . acetaminophen (TYLENOL) tablet 650 mg  650 mg Oral Q6H PRN Derrill Center, NP   650 mg at 12/15/15 1826  . albuterol (PROVENTIL HFA;VENTOLIN HFA) 108 (90 BASE) MCG/ACT inhaler 2 puff  2 puff Inhalation Q6H PRN Derrill Center, NP   2 puff at 12/14/15 0802  . alum & mag hydroxide-simeth (MAALOX/MYLANTA) 200-200-20 MG/5ML suspension 30 mL  30 mL Oral Q4H PRN Derrill Center, NP   30 mL at 12/04/15 0225  . benztropine (COGENTIN) tablet 0.5 mg  0.5 mg Oral QHS Derrill Center, NP   0.5 mg at 12/16/15 2144  . feeding supplement (ENSURE ENLIVE) (ENSURE ENLIVE)  liquid 237 mL  237 mL Oral BID BM Saramma Eappen, MD   237 mL at 12/16/15 1616  . gabapentin (NEURONTIN) capsule 300 mg  300 mg Oral BID Derrill Center, NP   300 mg at 12/17/15 0917  . haloperidol (HALDOL) tablet 10 mg  10 mg Oral QHS [name], MD   10 mg at 12/16/15 2143  . lidocaine (LIDODERM) 5 % 1 patch  1 patch Transdermal Q24H [name], MD   1 patch at 12/17/15 [phone]  . LORazepam (ATIVAN) tablet 1 mg  1 mg Oral BID PRN [name], MD   1 mg at 12/16/15 2211  . losartan (COZAAR) tablet 50 mg  50 mg Oral Daily Derrill Center, NP   50 mg at 12/17/15 0916  .  magnesium hydroxide (MILK OF MAGNESIA) suspension 30 mL  30 mL Oral Daily PRN Derrill Center, NP   30 mL at 12/05/15 0239  . menthol-cetylpyridinium (CEPACOL) lozenge 3 mg  1 lozenge Oral PRN [name], MD   3 mg at 12/15/15 1828  . mirtazapine (REMERON) tablet 15 mg  15 mg Oral QHS PRN Derrill Center, NP   15 mg at 12/16/15 2146  . nicotine (NICODERM CQ - dosed in mg/24 hours) patch 14 mg  14 mg Transdermal Daily [name], MD   14 mg at 12/16/15 0823  . OLANZapine (ZYPREXA) tablet 10 mg  10 mg Oral TID PRN [name], MD   10 mg at 12/13/15 0936   Or  . OLANZapine (ZYPREXA) injection 10 mg  10 mg Intramuscular TID PRN [name], MD      . Derrill Memo ON 01/10/2016] paliperidone (INVEGA SUSTENNA) injection 156 mg  156 mg Intramuscular Q28 days [name], MD      . pantoprazole (PROTONIX) EC tablet 40 mg  40 mg Oral Daily Derrill Center, NP   40 mg at 12/17/15 [id number]    Lab Results:  No results found for this or any previous visit (from the past 48 hour(s)).  Physical Findings: AIMS: Facial and Oral Movements Muscles of Facial Expression: None, normal Lips and Perioral Area: None, normal Jaw: None, normal Tongue: None, normal,Extremity Movements Upper (arms, wrists, hands, fingers): None, normal Lower (legs, knees, ankles, toes): None, normal, Trunk Movements Neck, shoulders, hips: None, normal, Overall Severity Severity of abnormal movements (highest score from questions above): None, normal Incapacitation due to abnormal movements: None, normal Patient's awareness of abnormal movements (rate only patient's report): No Awareness, Dental Status Current problems with teeth and/or dentures?: No Does patient usually wear dentures?: No  CIWA:  CIWA-Ar Total: 1 COWS:  COWS Total Score: 3  Musculoskeletal: Strength & Muscle Tone: within normal limits Gait & Station: normal Patient leans: N/A  Psychiatric Specialty Exam: Review of Systems  Musculoskeletal: Negative.    Psychiatric/Behavioral: Negative for suicidal ideas. Hallucinations: adutory. The patient is nervous/anxious.        Paranoia and delusions  All other systems reviewed and are negative.   Blood pressure 130/50, pulse 103, temperature 98 F (36.7 C), temperature source Oral, resp. rate 18, height 5\' 10"  (1.778 m), weight 86.183 kg (190 lb).Body mass index is 27.26 kg/(m^2).  General Appearance: Fairly Groomed  Engineer, water::  Good   Speech:  Normal Rate and Pressured  Volume:  Normal   Mood:  Angry and Anxious  Irritable- improving  Affect:  Labile with some improvement  Thought Process:  Disorganized and Loose more organized than yesterday  Orientation:  Full (  Time, Place, and Person)  Thought Content:  Delusions, Paranoid Ideation and Rumination    Suicidal Thoughts:  No  Homicidal Thoughts:  No  Memory:  Immediate;   Fair Recent;   Fair Remote;   Poor  Judgement:  Impaired  Insight:  Shallow  Psychomotor Activity:  Normal  Concentration:  Poor  Recall:  [employer] of Knowledge:Fair  Language: Fair  Akathisia:  No  Handed:  Right  AIMS (if indicated):     Assets:  Social Support Others:  access to health care  ADL's:  Intact  Cognition: WNL   Sleep:  Number of Hours: 4.5     I agree with current treatment plan on 12/17/2015, Patient seen face-to-face for psychiatric evaluation follow-up, reviewed the information documented and agree with the treatment plan.  Treatment Plan:  Daily contact with patient to assess and evaluate symptoms and progress in treatment and Medication management   Pt received Invega sustenna IM-  234 mg x 1 dose given 12/08/15,  Invega sustenna 156 mg IM  -  12/12/15. AIMS - 0 ( 12/12/15) Pt is currently restarted on Haldol , dose increased to 10 mg po qhs  for augmenting the effect of Invega. Pt on admission was on two antipsychotics- although he was not compliant.   continue Cogentin 0.5 mg po qhs for EPS.  continue Gabapentin  300 mg po bid for  anxiety sx.  continue Remeron 15 mg po qhs for affective sx as well as sleep. Start vistaril 25 mg PRN with x1 Repeat or insomnia Will continue to monitor vitals ,medication compliance and treatment side effects while patient is here.  Will monitor for medical issues as well as call consult as needed.   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 Pt also has a hx of CKD- Creatinine chronically elevated.  TSH - wnl ,lipid panel- wnl ,  hba1c- wnl , PL is elevated - likely 2/2 neuroleptics- will monitor. Vitamin b12- wnl, folate - wnl.  CSW will start working on disposition. Pt to be referred back to ACTT - once stable. Continue Recreational therapy consult . Patient to participate in therapeutic milieu   [place] 12/17/2015, 1:52 PM    I agreed with findings and treatment plan of this patient

## 2015-12-17 NOTE — BHH Group Notes (Signed)
[place] Group Notes:  Coping skills  Date:  12/17/2015  Time:  11:00  Type of Therapy:  Nurse Education  Participation Level:  Did Not Attend  Participation Quality:  Inattentive  Affect:  Blunted  Cognitive:  Lacking  Insight:  None  Engagement in Group:  Lacking  Modes of Intervention:  Education  Summary of Progress/Problems:  Delman Kitten 12/17/2015, 12:36 PM

## 2015-12-17 NOTE — Plan of Care (Signed)
Problem: Aggression Towards others,Towards Self, and or Destruction Goal: STG-Patient will comply with prescribed medication regimen (Patient will comply with prescribed medication regimen)  Outcome: Progressing Client is compliant with medication regime and will ask for medication when anxious "give me that anxiety medicine, I'm trying get some rest"

## 2015-12-17 NOTE — Plan of Care (Signed)
Problem: Ineffective individual coping Goal: STG: Patient will remain free from self harm Outcome: Progressing Pt safe on the unit at this time     

## 2015-12-17 NOTE — BHH Group Notes (Signed)
[place] Group Notes: (Clinical Social Work)   12/17/2015      Type of Therapy:  Group Therapy   Participation Level:  Did Not Attend despite MHT prompting   Selmer Dominion, LCSW 12/17/2015, 3:56 PM

## 2015-12-18 NOTE — BHH Group Notes (Signed)
[place] Group Notes:  (Clinical Social Work)  12/18/2015  [place] Group Notes:  (Clinical Social Work)  12/18/2015  11:00AM-12:00PM  Summary of Progress/Problems:  The main focus of today's process group was to listen to a variety of genres of music and to identify that different types of music provoke different responses.  The patient then was able to identify personally what was soothing for them, as well as energizing.  The patient was late to group, became tearful at one point but declined to have music changed, expressed understanding of concepts, as well as knowledge of how each type of music affected him and how this can be used at home as a wellness/recovery tool.  There was a great deal of singing, dancing, smiling, and interaction within the group.  Type of Therapy:  Music Therapy   Participation Level:  Active  Participation Quality:  Attentive   Affect:  Blunted and Tearful  Cognitive:  Disorganized  Insight:  Improving  Engagement in Therapy:  Engaged  Modes of Intervention:   Activity, Exploration  Selmer Dominion, LCSW 12/18/2015

## 2015-12-18 NOTE — Progress Notes (Signed)
D:Per patient self inventory form pt reports he slept good last night with the use of sleep medication. He reports a fair appetite, normal energy level, good concentration. He rates depression 1/10, hopelessness 7/10, anxiety 1/10- all on 0-10 scale, 10 being the worse. Pt denies SI/HI. Denies AVH, but appears to be responding to internal stimuli at times. Denies physical pain. Pt presents with anxiety. Attending nursing group on the unit.    A:special checks q 15 mins in place for safety. Medication administered per MD order(See eMAR). Encouragement and support provided.  R:Safety maintained. Compliant with medication regimen. Will continue to monitor.

## 2015-12-18 NOTE — BHH Group Notes (Signed)
[place] Group Notes:  (Nursing/MHT/Case Management/Adjunct)  Date:  12/18/2015  Time:0930  Type of Therapy:  Nurse Education  Participation Level:  Active  Participation Quality:  Redirectable  Affect:  Appropriate  Cognitive:  Disorganized  Insight:  Improving  Engagement in Group:  Improving  Modes of Intervention:  Activity, Clarification, Socialization and Support    Summary of Progress/Problems:  Forrestine Him 12/18/2015, 11:07 AM

## 2015-12-18 NOTE — Progress Notes (Signed)
[place] Group Notes:  (Nursing/MHT/Case Management/Adjunct)  Date:  12/18/2015  Time:  9:26 PM  Type of Therapy:  Psychoeducational Skills  Participation Level:  Active  Participation Quality:  Appropriate  Affect:  Flat  Cognitive:  Appropriate  Insight:  Limited  Engagement in Group:  Limited  Modes of Intervention:  Education  Summary of Progress/Problems: The patient had very little to share with the group this evening except to say that he was drowsy and that he needs his medication. As a theme for the day, his support system will be his nurse.   Archie Balboa S 12/18/2015, 9:26 PM

## 2015-12-18 NOTE — Progress Notes (Signed)
[facility] MD Progress Note  12/18/2015 1:51 PM Ronte Graveline  MRN:  [id number]   Subjective: Patient states "I slept better last night."   Objective:Laderrick Salonen is awake, alert and oriented X3 , found resting in bed. Patient has complaints of stomach pain 2/10. States the pain started today after eating is snack. Denies suicidal or homicidal ideation. Stats he has auditory or visual hallucination at times. and he does appear to be responding to internal stimuli. Patient reports he is medication compliant without mediation side effects. Patient states he was able to get sleep last night.  Reports good appetite. Support, encouragement and reassurance was provided.    Principal Problem: Paranoid schizophrenia ([place]) Diagnosis:   Patient Active Problem List   Diagnosis Date Noted  . 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 History of idiopathic thrombocytopenic purpura [Z86.2] 12/05/2015  . Paranoid schizophrenia ([place]) [F20.0] 12/03/2015  . HTN (hypertension) [I10] 03/04/2012  . GERD (gastroesophageal reflux disease) [K21.9] 03/04/2012  . Insomnia [G47.00] 03/04/2012  . CKD (chronic kidney disease) stage 2, GFR 60-89 ml/min [N18.2] 03/04/2012  . Schizophrenia, paranoid, chronic ([place]) [F20.0] 02/18/2012   Total Time spent with patient: 25 minutes  Past Psychiatric History: Pt was admitted to [facility] - schizophrenia - in 2013, was discharged on Haldol decanoate IM - and was referred to [facility].Pt also had recent hospitalization at [facility] - April 2016. Pt has an ACTT.  Past Medical History:  Past Medical History  Diagnosis Date  . Schizophrenia ([place])   . Pneumonia   . Anxiety   . GERD (gastroesophageal reflux disease)   . COPD (chronic obstructive pulmonary disease) [facility])     Past Surgical History  Procedure Laterality Date  . Tonsillectomy     Family History:  Family History  Problem Relation Age of Onset  . Arthritis Mother   . Hypertension Mother    Family Psychiatric  History: Pt  did not express any hx of mental illness in family. Social History: Pt lives by self in [place], [place]. Pt is single. History  Alcohol Use No     History  Drug Use No    Comment: denies    Social History   Social History  . Marital Status: Legally Separated    Spouse Name: N/A  . Number of Children: N/A  . Years of Education: N/A   Social History Main Topics  . Smoking status: Current Every Day Smoker -- 1.00 packs/day for 40 years    Types: Cigarettes    Start date: 04/14/1974  . Smokeless tobacco: Never Used  . Alcohol Use: No  . Drug Use: No     Comment: denies  . Sexual Activity: No   Other Topics Concern  . None   Social History Narrative   Additional Social History:   Sleep: Fair  Appetite:  Fair  Current Medications: Current Facility-Administered Medications  Medication Dose Route Frequency Provider Last Rate Last Dose  . acetaminophen (TYLENOL) tablet 650 mg  650 mg Oral Q6H PRN Derrill Center, NP   650 mg at 12/17/15 2046  . albuterol (PROVENTIL HFA;VENTOLIN HFA) 108 (90 BASE) MCG/ACT inhaler 2 puff  2 puff Inhalation Q6H PRN Derrill Center, NP   2 puff at 12/14/15 0802  . alum & mag hydroxide-simeth (MAALOX/MYLANTA) 200-200-20 MG/5ML suspension 30 mL  30 mL Oral Q4H PRN Derrill Center, NP   30 mL at 12/18/15 1126  . benztropine (COGENTIN) tablet 0.5 mg  0.5 mg Oral QHS Derrill Center, NP  0.5 mg at 12/17/15 2141  . feeding supplement (ENSURE ENLIVE) (ENSURE ENLIVE) liquid 237 mL  237 mL Oral BID BM Saramma Eappen, MD   237 mL at 12/18/15 1012  . gabapentin (NEURONTIN) capsule 300 mg  300 mg Oral BID Derrill Center, NP   300 mg at 12/18/15 0834  . haloperidol (HALDOL) tablet 10 mg  10 mg Oral QHS [name], MD   10 mg at 12/17/15 2141  . hydrOXYzine (ATARAX/VISTARIL) tablet 25 mg  25 mg Oral QHS,MR X 1 Derrill Center, NP   25 mg at 12/18/15 0005  . lidocaine (LIDODERM) 5 % 1 patch  1 patch Transdermal Q24H [name], MD   1 patch at 12/18/15 1012   . LORazepam (ATIVAN) tablet 1 mg  1 mg Oral BID PRN [name], MD   1 mg at 12/17/15 2142  . losartan (COZAAR) tablet 50 mg  50 mg Oral Daily Derrill Center, NP   50 mg at 12/18/15 0834  . magnesium hydroxide (MILK OF MAGNESIA) suspension 30 mL  30 mL Oral Daily PRN Derrill Center, NP   30 mL at 12/05/15 0239  . menthol-cetylpyridinium (CEPACOL) lozenge 3 mg  1 lozenge Oral PRN [name], MD   3 mg at 12/15/15 1828  . mirtazapine (REMERON) tablet 15 mg  15 mg Oral QHS PRN Derrill Center, NP   15 mg at 12/17/15 2142  . nicotine (NICODERM CQ - dosed in mg/24 hours) patch 14 mg  14 mg Transdermal Daily [name], MD   14 mg at 12/18/15 0836  . OLANZapine (ZYPREXA) tablet 10 mg  10 mg Oral TID PRN [name], MD   10 mg at 12/18/15 0006   Or  . OLANZapine (ZYPREXA) injection 10 mg  10 mg Intramuscular TID PRN [name], MD      . Derrill Memo ON 01/10/2016] paliperidone (INVEGA SUSTENNA) injection 156 mg  156 mg Intramuscular Q28 days [name], MD      . pantoprazole (PROTONIX) EC tablet 40 mg  40 mg Oral Daily Derrill Center, NP   40 mg at 12/18/15 [id number]    Lab Results:  No results found for this or any previous visit (from the past 48 hour(s)).  Physical Findings: AIMS: Facial and Oral Movements Muscles of Facial Expression: None, normal Lips and Perioral Area: None, normal Jaw: None, normal Tongue: None, normal,Extremity Movements Upper (arms, wrists, hands, fingers): None, normal Lower (legs, knees, ankles, toes): None, normal, Trunk Movements Neck, shoulders, hips: None, normal, Overall Severity Severity of abnormal movements (highest score from questions above): None, normal Incapacitation due to abnormal movements: None, normal Patient's awareness of abnormal movements (rate only patient's report): No Awareness, Dental Status Current problems with teeth and/or dentures?: No Does patient usually wear dentures?: No  CIWA:  CIWA-Ar Total: 1 COWS:  COWS Total  Score: 3  Musculoskeletal: Strength & Muscle Tone: within normal limits Gait & Station: normal Patient leans: N/A  Psychiatric Specialty Exam: Review of Systems  Constitutional: Negative.   HENT: Negative.   Eyes: Negative.   Respiratory: Negative.   Cardiovascular: Negative.   Gastrointestinal: Positive for heartburn, nausea and abdominal pain.  Genitourinary: Negative.   Musculoskeletal: Negative.   Skin: Negative.   Neurological: Negative.   Endo/Heme/Allergies: Negative.   Psychiatric/Behavioral: Negative for suicidal ideas. Hallucinations: auditory. The patient is nervous/anxious and has insomnia.        Paranoia and delusions  All other systems reviewed and are negative.  Blood pressure 155/93, pulse 110, temperature 98 F (36.7 C), temperature source Oral, resp. rate 17, height 5\' 10"  (1.778 m), weight 86.183 kg (190 lb).Body mass index is 27.26 kg/(m^2).  General Appearance: Casual and Fairly Groomed pleasant   Eye Contact::  Good   Speech:  Normal Rate and Pressured  Volume:  Normal   Mood:  Angry and Anxious  Irritable- improving  Affect:  Labile with some improvement  Thought Process:  Disorganized and Loose  association with organized thoughts throughout the assessment  Orientation:  Full (Time, Place, and Person)  Thought Content:  Delusions, Paranoid Ideation and Rumination    Suicidal Thoughts:  No  Homicidal Thoughts:  No  Memory:  Immediate;   Fair Recent;   Fair Remote;   Poor  Judgement:  Impaired  Insight:  Shallow  Psychomotor Activity:  Normal  Concentration:  Poor  Recall:  [employer] of Knowledge:Fair  Language: Fair  Akathisia:  No  Handed:  Right  AIMS (if indicated):     Assets:  Desire for Improvement Social Support Others:  access to health care  ADL's:  Intact  Cognition: WNL   Sleep:  Number of Hours: 4.5     I agree with current treatment plan on 12/18/2015, Patient seen face-to-face for psychiatric evaluation follow-up,  reviewed the information documented and agree with the treatment plan.  Treatment Plan:  Daily contact with patient to assess and evaluate symptoms and progress in treatment and Medication management   Pt received Invega sustenna IM-  234 mg x 1 dose given 12/08/15,  Invega sustenna 156 mg IM  -  12/12/15. AIMS - 0 ( 12/12/15) Pt is currently restarted on Haldol , dose increased to 10 mg po qhs  for augmenting the effect of Invega. Pt on admission was on two antipsychotics- although he was not compliant.   Continue Cogentin 0.5 mg po qhs for EPS. Continue Gabapentin  300 mg po bid for anxiety sx. Continue Remeron 15 mg po qhs for affective sx as well as sleep. Start vistaril 25 mg PRN with x1 Repeat or insomnia Will continue to monitor vitals ,medication compliance and treatment side effects while patient is here.  Will monitor for medical issues as well as call consult as needed.  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 Pt also has a hx of CKD- Creatinine chronically elevated. TSH - wnl ,lipid panel- wnl ,  hba1c- wnl , PL is elevated - likely 2/2 neuroleptics- will monitor. Vitamin b12- wnl, folate - wnl.  CSW will start working on disposition. Pt to be referred back to ACTT - once stable. Continue Recreational therapy consult . Patient to participate in therapeutic milieu   Axis 12/18/2015, 1:51 PM I agreed with findings and treatment plan of this patient

## 2015-12-19 MED ORDER — TUBERCULIN PPD 5 UNIT/0.1ML ID SOLN
5.0000 [IU] | Freq: Once | INTRADERMAL | Status: AC
  Administered 2015-12-19: 5 [IU] via INTRADERMAL

## 2015-12-19 NOTE — Progress Notes (Signed)
Writer has observed patient up in the dayroom talking with peers. He continues to be preoccupied with speaking to someone at the [place] office. Writer reminded him about calling the the sheriff office and he was receptive. He is still delusional in his thought process. He denies si.hi/a/v hallucinations. He is compliant with medications and is re directable when intrusive.

## 2015-12-19 NOTE — Progress Notes (Signed)
Patient ID: David [name], male   DOB: Jan 27, 1956, 59 y.o.   MRN: [id number] PER STATE REGULATIONS 482.30  THIS CHART WAS REVIEWED FOR MEDICAL NECESSITY WITH RESPECT TO THE PATIENT'S ADMISSION/ DURATION OF STAY.  NEXT REVIEW DATE: 12/23/2015  Chauncy Lean, RN, BSN CASE MANAGER

## 2015-12-19 NOTE — Progress Notes (Signed)
PPD completed per MD order; left forearm on 12/19/2015 @ 1838

## 2015-12-19 NOTE — BHH Group Notes (Signed)
[facility] LCSW Aftercare Discharge Planning Group Note   12/19/2015 11:05 AM  Participation Quality:  Invited.  Chose to not attend.    Roque Lias B

## 2015-12-19 NOTE — NC FL2 (Signed)
[place] LEVEL OF CARE SCREENING TOOL     IDENTIFICATION  Patient Name: David [name] Birthdate: Jan 13, 1956 Sex: male Admission Date (Current Location): 12/03/2015  [place] and [place] Number:  [place] [id number] [place] and Address:   [facility]  [address] [place], [place] [address])      Provider Number: [id number]  Attending Physician Name and Address:  [name], MD  Relative Name and Phone Number:       Current Level of Care: Hospital Recommended Level of Care: [place], [place] Prior Approval Number:    Date Approved/Denied:   PASRR Number:    Discharge Plan: Domiciliary (Rest home)    Current Diagnoses: Patient Active Problem List   Diagnosis Date Noted  . Hyperprolactinemia ([place]) 12/07/2015  . History of idiopathic thrombocytopenic purpura 12/05/2015  . Paranoid schizophrenia ([place]) 12/03/2015  . HTN (hypertension) 03/04/2012  . GERD (gastroesophageal reflux disease) 03/04/2012  . Insomnia 03/04/2012  . CKD (chronic kidney disease) stage 2, GFR 60-89 ml/min 03/04/2012  . Schizophrenia, paranoid, chronic ([place]) 02/18/2012    Orientation RESPIRATION BLADDER Height & Weight    Self, Time, Situation, Place  Normal Continent 5\' 10"  (177.8 cm) 190 lbs.  BEHAVIORAL SYMPTOMS/MOOD NEUROLOGICAL BOWEL NUTRITION STATUS      Continent Diet (Normal)  AMBULATORY STATUS COMMUNICATION OF NEEDS Skin   Independent Verbally Normal                       Personal Care Assistance Level of Assistance  Bathing, Feeding, Dressing Bathing Assistance: Independent Feeding assistance: Independent Dressing Assistance: Independent     Functional Limitations Info  Sight, Hearing, Speech Sight Info: Adequate Hearing Info: Adequate Speech Info: Adequate    SPECIAL CARE FACTORS FREQUENCY                       Contractures Contractures Info: Not present    Additional Factors Info  Psychotropic     Psychotropic  Info: See MAR         Current Medications (12/19/2015):  This is the current hospital active medication list Current Facility-Administered Medications  Medication Dose Route Frequency Provider Last Rate Last Dose  . acetaminophen (TYLENOL) tablet 650 mg  650 mg Oral Q6H PRN Derrill Center, NP   650 mg at 12/19/15 0940  . albuterol (PROVENTIL HFA;VENTOLIN HFA) 108 (90 BASE) MCG/ACT inhaler 2 puff  2 puff Inhalation Q6H PRN Derrill Center, NP   2 puff at 12/14/15 0802  . alum & mag hydroxide-simeth (MAALOX/MYLANTA) 200-200-20 MG/5ML suspension 30 mL  30 mL Oral Q4H PRN Derrill Center, NP   30 mL at 12/19/15 1118  . benztropine (COGENTIN) tablet 0.5 mg  0.5 mg Oral QHS Derrill Center, NP   0.5 mg at 12/18/15 2204  . feeding supplement (ENSURE ENLIVE) (ENSURE ENLIVE) liquid 237 mL  237 mL Oral BID BM Saramma Eappen, MD   237 mL at 12/19/15 1410  . gabapentin (NEURONTIN) capsule 300 mg  300 mg Oral BID Derrill Center, NP   300 mg at 12/19/15 0840  . haloperidol (HALDOL) tablet 10 mg  10 mg Oral QHS [name], MD   10 mg at 12/18/15 2204  . hydrOXYzine (ATARAX/VISTARIL) tablet 25 mg  25 mg Oral QHS,MR X 1 Derrill Center, NP   25 mg at 12/18/15 2250  . lidocaine (LIDODERM) 5 % 1 patch  1 patch Transdermal Q24H [name], MD  1 patch at 12/19/15 0942  . LORazepam (ATIVAN) tablet 1 mg  1 mg Oral BID PRN [name], MD   1 mg at 12/18/15 2214  . losartan (COZAAR) tablet 50 mg  50 mg Oral Daily Derrill Center, NP   50 mg at 12/19/15 0840  . magnesium hydroxide (MILK OF MAGNESIA) suspension 30 mL  30 mL Oral Daily PRN Derrill Center, NP   30 mL at 12/05/15 0239  . menthol-cetylpyridinium (CEPACOL) lozenge 3 mg  1 lozenge Oral PRN [name], MD   3 mg at 12/19/15 1117  . mirtazapine (REMERON) tablet 15 mg  15 mg Oral QHS PRN Derrill Center, NP   15 mg at 12/18/15 2204  . nicotine (NICODERM CQ - dosed in mg/24 hours) patch 14 mg  14 mg Transdermal Daily [name], MD   14 mg at  12/19/15 0841  . OLANZapine (ZYPREXA) tablet 10 mg  10 mg Oral TID PRN [name], MD   10 mg at 12/18/15 0006   Or  . OLANZapine (ZYPREXA) injection 10 mg  10 mg Intramuscular TID PRN [name], MD      . Derrill Memo ON 01/10/2016] paliperidone (INVEGA SUSTENNA) injection 156 mg  156 mg Intramuscular Q28 days [name], MD      . pantoprazole (PROTONIX) EC tablet 40 mg  40 mg Oral Daily Derrill Center, NP   40 mg at 12/19/15 0840  . tuberculin injection 5 Units  5 Units Intradermal Once [name], MD         Discharge Medications: Please see discharge summary for a list of discharge medications.  Relevant Imaging Results:  Relevant Lab Results:   Additional Information Allergies; trazodone, lithium, nafazadone  Margee Trentham, [place] B, [place]

## 2015-12-19 NOTE — Progress Notes (Signed)
D:Per patient self inventory form pt reports he slept fair last night with the use of sleep medication. Pt reports a fair appetite, normal energy level, good concentration. He rates depression 1/10, hopelessness 1/10, anxiety 1/10- all on 0-10 scale, 10 being the worse. Pt denies SI/HI. Denies AVH. Pt reports his goal is "leaving" and he will meet his goal by "cooperation" Pt has no insight in regards to his behavior. Pleasantly psychotic, delusional.  A:Special checks q 15 mins in place for safety. Medication administered per MD order(see eMAR) Encouragement and support provided.  R:safety maintained.Compliant with medication regimen. Will continue to monitor.

## 2015-12-19 NOTE — Progress Notes (Signed)
[facility] MD Progress Note  12/19/2015 3:16 PM David [name]  MRN:  [id number]   Subjective: Patient states "I am fine . I want to go home.'    Objective:Patient is a 53 y old CM , who has a hx of schizophrenia , who presented to [facility]- for hearing AH as well as asking to go to [name].  Patient seen and chart reviewed today .Discussed patient with treatment team.  David [name] is awake, [name] and oriented X3 , found resting in bed. Patient today seems more cooperative , redirectable , however continues to have delusions . He is able to hold a conversation well initially , but is seen as going off track and talking about the war and the oriental communist and medical marijuana. Pt's mother is patient's legal guardian. Writer met with her briefly.Mother would like patient placed in an ALF - since he does not seem to be back at his baseline. CSW will start referral process. Patient denies any new concerns other than discharge .Will continue to support.   Principal Problem: Paranoid schizophrenia ([place]) Diagnosis:   Patient Active Problem List   Diagnosis Date Noted  . Hyperprolactinemia ([place]) [E22.1] 12/07/2015  . History of idiopathic thrombocytopenic purpura [Z86.2] 12/05/2015  . Paranoid schizophrenia ([place]) [F20.0] 12/03/2015  . HTN (hypertension) [I10] 03/04/2012  . GERD (gastroesophageal reflux disease) [K21.9] 03/04/2012  . Insomnia [G47.00] 03/04/2012  . CKD (chronic kidney disease) stage 2, GFR 60-89 ml/min [N18.2] 03/04/2012  . Schizophrenia, paranoid, chronic ([place]) [F20.0] 02/18/2012   Total Time spent with patient: 25 minutes  Past Psychiatric History: Pt was admitted to [name] - schizophrenia - in 2013, was discharged on Haldol decanoate IM - and was referred to [name].Pt also had recent hospitalization at [facility] - April 2016. Pt has an ACTT.  Past Medical History:  Past Medical History  Diagnosis Date  . Schizophrenia ([place])   . Pneumonia   . Anxiety   . GERD (gastroesophageal  reflux disease)   . COPD (chronic obstructive pulmonary disease) [facility])     Past Surgical History  Procedure Laterality Date  . Tonsillectomy     Family History:  Family History  Problem Relation Age of Onset  . Arthritis Mother   . Hypertension Mother    Family Psychiatric  History: Pt did not express any hx of mental illness in family. Social History: Pt lives by self in [place], [place]. Pt is single. History  Alcohol Use No     History  Drug Use No    Comment: denies    Social History   Social History  . Marital Status: Legally Separated    Spouse Name: N/A  . Number of Children: N/A  . Years of Education: N/A   Social History Main Topics  . Smoking status: Current Every Day Smoker -- 1.00 packs/day for 40 years    Types: Cigarettes    Start date: 04/14/1974  . Smokeless tobacco: Never Used  . Alcohol Use: No  . Drug Use: No     Comment: denies  . Sexual Activity: No   Other Topics Concern  . None   Social History Narrative   Additional Social History:   Sleep: Fair  Appetite:  Fair  Current Medications: Current Facility-Administered Medications  Medication Dose Route Frequency Provider Last Rate Last Dose  . acetaminophen (TYLENOL) tablet 650 mg  650 mg Oral Q6H PRN David Center, NP   650 mg at 12/19/15 0940  . albuterol (PROVENTIL HFA;VENTOLIN HFA) 108 (90 BASE) MCG/ACT  inhaler 2 puff  2 puff Inhalation Q6H PRN David Center, NP   2 puff at 12/14/15 0802  . alum & mag hydroxide-simeth (MAALOX/MYLANTA) 200-200-20 MG/5ML suspension 30 mL  30 mL Oral Q4H PRN David Center, NP   30 mL at 12/19/15 1118  . benztropine (COGENTIN) tablet 0.5 mg  0.5 mg Oral QHS David Center, NP   0.5 mg at 12/18/15 2204  . feeding supplement (ENSURE ENLIVE) (ENSURE ENLIVE) liquid 237 mL  237 mL Oral BID BM David Isabell, MD   237 mL at 12/19/15 1410  . gabapentin (NEURONTIN) capsule 300 mg  300 mg Oral BID David Center, NP   300 mg at 12/19/15 0840  . haloperidol  (HALDOL) tablet 10 mg  10 mg Oral QHS David Alert, MD   10 mg at 12/18/15 2204  . hydrOXYzine (ATARAX/VISTARIL) tablet 25 mg  25 mg Oral QHS,MR X 1 David Center, NP   25 mg at 12/18/15 2250  . lidocaine (LIDODERM) 5 % 1 patch  1 patch Transdermal Q24H David Cradle, MD   1 patch at 12/19/15 [phone]  . LORazepam (ATIVAN) tablet 1 mg  1 mg Oral BID PRN David Alert, MD   1 mg at 12/18/15 2214  . losartan (COZAAR) tablet 50 mg  50 mg Oral Daily David Center, NP   50 mg at 12/19/15 0840  . magnesium hydroxide (MILK OF MAGNESIA) suspension 30 mL  30 mL Oral Daily PRN David Center, NP   30 mL at 12/05/15 0239  . menthol-cetylpyridinium (CEPACOL) lozenge 3 mg  1 lozenge Oral PRN David Alert, MD   3 mg at 12/19/15 1117  . mirtazapine (REMERON) tablet 15 mg  15 mg Oral QHS PRN David Center, NP   15 mg at 12/18/15 2204  . nicotine (NICODERM CQ - dosed in mg/24 hours) patch 14 mg  14 mg Transdermal Daily David Alert, MD   14 mg at 12/19/15 0841  . OLANZapine (ZYPREXA) tablet 10 mg  10 mg Oral TID PRN David Alert, MD   10 mg at 12/18/15 0006   Or  . OLANZapine (ZYPREXA) injection 10 mg  10 mg Intramuscular TID PRN David Alert, MD      . David Memo ON 01/10/2016] paliperidone (INVEGA SUSTENNA) injection 156 mg  156 mg Intramuscular Q28 days David Alert, MD      . pantoprazole (PROTONIX) EC tablet 40 mg  40 mg Oral Daily David Center, NP   40 mg at 12/19/15 0840  . tuberculin injection 5 Units  5 Units Intradermal Once David Alert, MD        Lab Results:  No results found for this or any previous visit (from the past 48 hour(s)).  Physical Findings: AIMS: Facial and Oral Movements Muscles of Facial Expression: None, normal Lips and Perioral Area: None, normal Jaw: None, normal Tongue: None, normal,Extremity Movements Upper (arms, wrists, hands, fingers): None, normal Lower (legs, knees, ankles, toes): None, normal, Trunk Movements Neck, shoulders, hips: None, normal, Overall  Severity Severity of abnormal movements (highest score from questions above): None, normal Incapacitation due to abnormal movements: None, normal Patient's awareness of abnormal movements (rate only patient's report): No Awareness, Dental Status Current problems with teeth and/or dentures?: No Does patient usually wear dentures?: No  CIWA:  CIWA-Ar Total: 1 COWS:  COWS Total Score: 3  Musculoskeletal: Strength & Muscle Tone: within normal limits Gait & Station: normal Patient leans: N/A  Psychiatric Specialty Exam: Review  of Systems  Constitutional: Negative.   HENT: Negative.   Eyes: Negative.   Respiratory: Negative.   Cardiovascular: Negative.   Genitourinary: Negative.   Musculoskeletal: Negative.   Skin: Negative.   Neurological: Negative.   Endo/Heme/Allergies: Negative.   Psychiatric/Behavioral: Positive for hallucinations (delusions). Negative for suicidal ideas. The patient is nervous/anxious.        Paranoia and delusions  All other systems reviewed and are negative.   Blood pressure 131/78, pulse 108, temperature 98.3 F (36.8 C), temperature source Oral, resp. rate 20, height '5\' 10"'$  (1.778 m), weight 86.183 kg (190 lb).Body mass index is 27.26 kg/(m^2).  General Appearance: Casual and Fairly Groomed pleasant   Eye Contact::  Good   Speech:  Normal Rate and Pressured  Volume:  Normal   Mood:  Angry and Anxious  Irritable- improving  Affect:  Labile with some improvement  Thought Process:  Disorganized and Loose with some improvement  Orientation:  Full (Time, Place, and Person)  Thought Content:  Delusions, Paranoid Ideation and Rumination  improving  Suicidal Thoughts:  No  Homicidal Thoughts:  No  Memory:  Immediate;   Fair Recent;   Fair Remote;   Poor  Judgement:  Impaired  Insight:  Shallow  Psychomotor Activity:  Normal  Concentration:  Fair  Recall:  [place]: Fair  Akathisia:  No  Handed:  Right  AIMS (if  indicated):     Assets:  Desire for Improvement Social Support Others:  access to health care  ADL's:  Intact  Cognition: WNL   Sleep:  Number of Hours: 6.5      Treatment Plan: Patient continues to improve, although has delusions and tangential thought process- will continue treatment. Daily contact with patient to assess and evaluate symptoms and progress in treatment and Medication management   Pt received Invega sustenna IM-  234 mg x 1 dose given 12/08/15,  Invega sustenna 156 mg IM  -  12/12/15. AIMS - 0 ( 12/12/15) Pt is currently restarted on Haldol , dose increased to 10 mg po qhs  for augmenting the effect of Invega. Pt on admission was on two antipsychotics- although he was not compliant.  Continue Cogentin 0.5 mg po qhs for EPS. Continue Gabapentin  300 mg po bid for anxiety sx. Continue Remeron 15 mg po qhs for affective sx as well as sleep.Continue Vistaril 25 mg PRN with x1 Repeat or insomnia Will continue to monitor vitals ,medication compliance and treatment side effects while patient is here.  Will monitor for medical issues as well as call consult as needed.   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 Pt also has a hx of CKD- Creatinine chronically elevated. TSH - wnl ,lipid panel- wnl ,  hba1c- wnl , PL is elevated - likely 2/2 neuroleptics- will monitor. Vitamin b12- wnl, folate - wnl.  CSW will start working on disposition. Pt to be referred to ALF for placement - Mother is the legal guardian.Will provide PPD for [name] placement. Continue Recreational therapy consult . Patient to participate in therapeutic milieu   David Buss MD 12/19/2015, 3:16 PM

## 2015-12-19 NOTE — Progress Notes (Signed)
Pt mother and brother at bedside visiting with pt.

## 2015-12-19 NOTE — B[facility] Group Notes (Signed)
[place] LCSW Group Therapy  12/19/2015 1:15 pm  Type of Therapy: Process Group Therapy  Participation Level:  Active  Participation Quality:  Appropriate  Affect:  Flat  Cognitive:  Oriented  Insight:  Improving  Engagement in Group:  Limited  Engagement in Therapy:  Limited  Modes of Intervention:  Activity, Clarification, Education, Problem-solving and Support  Summary of Progress/Problems: Today's group addressed the issue of overcoming obstacles.  Patients were asked to identify their biggest obstacle post d/c that stands in the way of their on-going success, and then problem solve as to how to manage this.  Initially, David [name] wanted to talk about being held here against his will, and explained how he was told on Friday that he would leave today, and how again today he was told he would leave today, and yet no one is getting him ready for discharge.  After letting him vent for awhile, I pointed out how calmly he is handling this, and other group members agreed, pointing out how irritable and angry he would get last week.  He appeared to appreciate the positive feedback.  Later in group, brought up the "atom bomb", "medical marijuana for anyone over 25" and "sewer drainage problems both her and at the [place]."  David [name] 12/19/2015   2:51 PM

## 2015-12-19 NOTE — Progress Notes (Signed)
Adult Psychoeducational Group Note  Date:  12/19/2015 Time:  11:39 AM  Group Topic/Focus:  Developing a Wellness Toolbox:   The focus of this group is to help patients develop a "wellness toolbox" with skills and strategies to promote recovery upon discharge.  Participation Level:  None  Participation Quality:  Inattentive and Redirectable  Affect:  Irritable  Cognitive:  Disorganized  Insight: None  Engagement in Group:  Limited  Modes of Intervention:  Discussion and Education  Additional Comments:  Pt was disruptive during group this morning and eventually left the group.  Kamarah Bilotta E 12/19/2015, 11:39 AM

## 2015-12-20 MED ORDER — HALOPERIDOL 5 MG PO TABS
12.0000 mg | ORAL_TABLET | Freq: Every day | ORAL | Status: DC
  Administered 2015-12-21 – 2015-12-29 (×9): 12 mg via ORAL
  Filled 2015-12-20 (×12): qty 1

## 2015-12-20 NOTE — Progress Notes (Signed)
[facility] MD Progress Note  12/20/2015 2:07 PM David [name]  MRN:  [id number]   Subjective: Patient states "I am fine. I just want to go home. I am still having thoughts about hurting the oriental communist. They are not doing right with the [place].'    Objective:Patient is a 89 y old CM , who has a hx of schizophrenia , who presented to [facility]- for hearing AH as well as asking to go to [facility].  Patient seen and chart reviewed today .Discussed patient with treatment team.  David [name] is awake, alert and oriented X3 . Patient today continues to be delusional, talks about building an underground [place] between [employer]. Pt however , does not seem to be too focussed on his delusions and is easily redirected. Pt otherwise is noted as less anxious, less irritable than previous days. Pt seems to be tolerating his medications well.  Pt's mother is patient's legal guardian.Mother wants pt to be placed at an ALF. CSW will work on the same.   Principal Problem: Paranoid schizophrenia (Home) Diagnosis:   Patient Active Problem List   Diagnosis Date Noted  . 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 History of idiopathic thrombocytopenic purpura [Z86.2] 12/05/2015  . Paranoid schizophrenia ([place]) [F20.0] 12/03/2015  . HTN (hypertension) [I10] 03/04/2012  . GERD (gastroesophageal reflux disease) [K21.9] 03/04/2012  . Insomnia [G47.00] 03/04/2012  . CKD (chronic kidney disease) stage 2, GFR 60-89 ml/min [N18.2] 03/04/2012  . Schizophrenia, paranoid, chronic ([place]) [F20.0] 02/18/2012   Total Time spent with patient: 25 minutes  Past Psychiatric History: Pt was admitted to [facility] - schizophrenia - in 2013, was discharged on Haldol decanoate IM - and was referred to [facility].Pt also had recent hospitalization at [facility] - April 2016. Pt has an ACTT.  Past Medical History:  Past Medical History  Diagnosis Date  . Schizophrenia ([place])   . Pneumonia   . Anxiety   . GERD (gastroesophageal  reflux disease)   . COPD (chronic obstructive pulmonary disease) [facility])     Past Surgical History  Procedure Laterality Date  . Tonsillectomy     Family History:  Family History  Problem Relation Age of Onset  . Arthritis Mother   . Hypertension Mother    Family Psychiatric  History: Pt did not express any hx of mental illness in family. Social History: Pt lives by self in [place], [place]. Pt is single. History  Alcohol Use No     History  Drug Use No    Comment: denies    Social History   Social History  . Marital Status: Legally Separated    Spouse Name: N/A  . Number of Children: N/A  . Years of Education: N/A   Social History Main Topics  . Smoking status: Current Every Day Smoker -- 1.00 packs/day for 40 years    Types: Cigarettes    Start date: 04/14/1974  . Smokeless tobacco: Never Used  . Alcohol Use: No  . Drug Use: No     Comment: denies  . Sexual Activity: No   Other Topics Concern  . None   Social History Narrative   Additional Social History:   Sleep: Fair  Appetite:  Fair  Current Medications: Current Facility-Administered Medications  Medication Dose Route Frequency Provider Last Rate Last Dose  . acetaminophen (TYLENOL) tablet 650 mg  650 mg Oral Q6H PRN Derrill Center, NP   650 mg at 12/19/15 0940  . albuterol (PROVENTIL HFA;VENTOLIN HFA) 108 (90 BASE) MCG/ACT inhaler  2 puff  2 puff Inhalation Q6H PRN Derrill Center, NP   2 puff at 12/14/15 0802  . alum & mag hydroxide-simeth (MAALOX/MYLANTA) 200-200-20 MG/5ML suspension 30 mL  30 mL Oral Q4H PRN Derrill Center, NP   30 mL at 12/19/15 1118  . benztropine (COGENTIN) tablet 0.5 mg  0.5 mg Oral QHS Derrill Center, NP   0.5 mg at 12/19/15 2121  . feeding supplement (ENSURE ENLIVE) (ENSURE ENLIVE) liquid 237 mL  237 mL Oral BID BM Jelicia Nantz, MD   237 mL at 12/20/15 1047  . gabapentin (NEURONTIN) capsule 300 mg  300 mg Oral BID Derrill Center, NP   300 mg at 12/20/15 0740  . haloperidol  (HALDOL) tablet 12 mg  12 mg Oral QHS Jaevian Shean, MD      . hydrOXYzine (ATARAX/VISTARIL) tablet 25 mg  25 mg Oral QHS,MR X 1 Derrill Center, NP   25 mg at 12/19/15 2311  . lidocaine (LIDODERM) 5 % 1 patch  1 patch Transdermal Q24H [name], MD   1 patch at 12/20/15 1048  . LORazepam (ATIVAN) tablet 1 mg  1 mg Oral BID PRN [name], MD   1 mg at 12/19/15 2124  . losartan (COZAAR) tablet 50 mg  50 mg Oral Daily Derrill Center, NP   50 mg at 12/20/15 0740  . magnesium hydroxide (MILK OF MAGNESIA) suspension 30 mL  30 mL Oral Daily PRN Derrill Center, NP   30 mL at 12/05/15 0239  . menthol-cetylpyridinium (CEPACOL) lozenge 3 mg  1 lozenge Oral PRN [name], MD   3 mg at 12/19/15 1117  . mirtazapine (REMERON) tablet 15 mg  15 mg Oral QHS PRN Derrill Center, NP   15 mg at 12/19/15 2311  . nicotine (NICODERM CQ - dosed in mg/24 hours) patch 14 mg  14 mg Transdermal Daily [name], MD   14 mg at 12/20/15 0744  . OLANZapine (ZYPREXA) tablet 10 mg  10 mg Oral TID PRN [name], MD   10 mg at 12/18/15 0006   Or  . OLANZapine (ZYPREXA) injection 10 mg  10 mg Intramuscular TID PRN [name], MD      . Derrill Memo ON 01/10/2016] paliperidone (INVEGA SUSTENNA) injection 156 mg  156 mg Intramuscular Q28 days [name], MD      . pantoprazole (PROTONIX) EC tablet 40 mg  40 mg Oral Daily Derrill Center, NP   40 mg at 12/20/15 0740  . tuberculin injection 5 Units  5 Units Intradermal Once [name], MD   5 Units at 12/19/15 1837    Lab Results:  No results found for this or any previous visit (from the past 48 hour(s)).  Physical Findings: AIMS: Facial and Oral Movements Muscles of Facial Expression: None, normal Lips and Perioral Area: None, normal Jaw: None, normal Tongue: None, normal,Extremity Movements Upper (arms, wrists, hands, fingers): None, normal Lower (legs, knees, ankles, toes): None, normal, Trunk Movements Neck, shoulders, hips: None, normal, Overall  Severity Severity of abnormal movements (highest score from questions above): None, normal Incapacitation due to abnormal movements: None, normal Patient's awareness of abnormal movements (rate only patient's report): No Awareness, Dental Status Current problems with teeth and/or dentures?: No Does patient usually wear dentures?: No  CIWA:  CIWA-Ar Total: 1 COWS:  COWS Total Score: 3  Musculoskeletal: Strength & Muscle Tone: within normal limits Gait & Station: normal Patient leans: N/A  Psychiatric Specialty Exam: Review of  Systems  Constitutional: Negative.   HENT: Negative.   Eyes: Negative.   Respiratory: Negative.   Cardiovascular: Negative.   Genitourinary: Negative.   Musculoskeletal: Negative.   Skin: Negative.   Neurological: Negative.   Endo/Heme/Allergies: Negative.   Psychiatric/Behavioral: Positive for hallucinations (delusions). Negative for suicidal ideas. The patient is nervous/anxious.        Paranoia and delusions  All other systems reviewed and are negative.   Blood pressure 154/79, pulse 107, temperature 98 F (36.7 C), temperature source Oral, resp. rate 17, height 5\' 10"  (1.778 m), weight 86.183 kg (190 lb).Body mass index is 27.26 kg/(m^2).  General Appearance: Casual and Fairly Groomed pleasant   Eye Contact::  Good   Speech:  Normal Rate and Pressured  Volume:  Normal   Mood:  Angry and Anxious  Irritable- improving  Affect:  Labile with some improvement  Thought Process:  Disorganized and Loose with some improvement  Orientation:  Full (Time, Place, and Person)  Thought Content:  Delusions, Paranoid Ideation and Rumination  improving  Suicidal Thoughts:  No  Homicidal Thoughts:  No  Memory:  Immediate;   Fair Recent;   Fair Remote;   Poor  Judgement:  Impaired  Insight:  Shallow  Psychomotor Activity:  Normal  Concentration:  Fair  Recall:  [place]: Fair  Akathisia:  No  Handed:  Right  AIMS (if  indicated):   0  Assets:  Desire for Improvement Social Support Others:  access to health care  ADL's:  Intact  Cognition: WNL   Sleep:  Number of Hours: 6      Treatment Plan: Patient continues to improve, although has delusions and tangential thought process- will continue treatment. Daily contact with patient to assess and evaluate symptoms and progress in treatment and Medication management   Pt received Invega sustenna IM-  234 mg x 1 dose given 12/08/15,  Invega sustenna 156 mg IM  -  12/12/15. AIMS - 0 ( 12/12/15) Pt is currently restarted on Haldol , dose increased to 12 mg po qhs  for augmenting the effect of Invega. Pt on admission was on two antipsychotics- although he was not compliant.  Continue Cogentin 0.5 mg po qhs for EPS. Continue Gabapentin  300 mg po bid for anxiety sx. Continue Remeron 15 mg po qhs for affective sx as well as sleep.Continue Vistaril 25 mg PRN with x1 Repeat or insomnia Will continue to monitor vitals ,medication compliance and treatment side effects while patient is here.  Will monitor for medical issues as well as call consult as needed.   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 Pt also has a hx of CKD- Creatinine chronically elevated. TSH - wnl ,lipid panel- wnl ,  hba1c- wnl , PL is elevated - likely 2/2 neuroleptics- will monitor.  CSW will start working on disposition. Pt to be referred to ALF for placement - Mother is the legal guardian.Provided PPD for [facility] placement- 12/19/15. Continue Recreational therapy consult . Patient to participate in therapeutic milieu   Reilyn Nelson MD 12/20/2015, 2:07 PM

## 2015-12-20 NOTE — Progress Notes (Addendum)
Recreation Therapy Notes  Animal-Assisted Activity (AAA) Program Checklist/Progress Notes Patient Eligibility Criteria Checklist & Daily Group note for Rec Tx Intervention  Date: 12.20.2016  Time: 2:45pm Location: [address]    AAA/T Program Assumption of Risk Form signed by Patient/ or Parent Legal Guardian yes  Patient is free of allergies or sever asthma yes  Patient reports no fear of animals yes  Patient reports no history of cruelty to animals yes  Patient understands his/her participation is voluntary yes  Patient washes hands before animal contact yes  Patient washes hands after animal contact yes  Behavioral Response: Appropriate   Education: Hand Washing, Appropriate Animal Interaction   Education Outcome: Acknowledges education.   Clinical Observations/Feedback: With MD approval patient attended pet therapy session on adult unit. Patient interacted appropriately with therapy dog, but did express concerns that the therapy dog was thirsty because he was panting slightly. Peer got therapy dog a small cup of water, which seemed to appease patient. Patient isolated from group when not interacting with therapy dog, sitting in corner of room. At approximately 3:10pm MHT asked patient to leave group to meet with his brother, who had come to visit patient. Patient left group session without incident.   David [name] David [name], LRT/CTRS  [name] 12/20/2015 3:59 PM

## 2015-12-20 NOTE — Progress Notes (Signed)
Recreation Therapy Notes  12.19.2016 @ approximately 3:00pm. LRT met with patient, patient declined activity, but readily interacted with LRT. Patient told story to LRT, which started with him going to [employer] for 2 years, patient declined to shared charges that landed him in prison. Patient spoke about other hospitalizations and ended story by sharing that he was engaged in sexual activity with two women, one he was living with at the time and one he was not.   Laureen Ochs Marthe Dant, LRT/CTRS   [name] 12/20/2015 8:31 AM

## 2015-12-20 NOTE — BHH Group Notes (Signed)
[facility] Group Notes:  (Nursing/MHT/Case Management/Adjunct)  Date:  12/20/2015  Time:  0930  Type of Therapy:  Nurse Education  Participation Level:  Active  Participation Quality:  Appropriate and Attentive  Affect:  Appropriate  Cognitive:  Alert and Appropriate  Insight:  Appropriate and Good  Engagement in Group:  Engaged  Modes of Intervention:  Activity, Discussion, Education and Exploration  Summary of Progress/Problems: Topic was on recovery.  Discussed the recovery process and that it is individualized.  Group encouraged to stay and continue with the process even when they reach fall short or reach a road block.  Patient was attentive and receptive. Coralyn Mark Iwenekha 12/20/15,  1:22 PM

## 2015-12-20 NOTE — Progress Notes (Signed)
D: Pt presents blunted in affect and labile in mood. Pt displayed some moments of agitation. Overall pt was calm and cooperative. Pt requested and received his first BID PRN dose of Ativan at 2116. Pt requested an additional dose two hours later. Pt was reminded of the close proximity of administration times. Pt was encouraged to take the remainder of his qhs medications. Pt refused the Haldol with no explanation given. On-call provider contacted in regards to pt receiving an additional Ativan. On-call provider consented for pt to receive an additional dose of Ativan. Pt is currently negative for any SI/HI/AVH. A: Writer administered scheduled and prn medications to pt, per MD orders. Continued support and availability as needed was extended to this pt. Staff continues to monitor pt with q7min checks.  R: No adverse drug reactions noted. Pt receptive to treatment. Pt remains safe at this time.

## 2015-12-20 NOTE — Progress Notes (Signed)
D: Pt remains delusional. However, pt is able to hold logical conversations at greater lengths of time. Pt is denying any SI/HI/AVH. Pt appeared to have better control over his agitation. Pt noted to be labile in the past. Pt observed watching tv and interacting with his peers in the dayroom. Pt is compliant with his current POC.  A: Writer administered scheduled and prn medications to pt, per MD orders. Continued support and availability as needed was extended to this pt. Staff continues to monitor pt with q38min checks.  R: No adverse drug reactions noted. Pt receptive to treatment. Pt remains safe at this time.

## 2015-12-20 NOTE — Progress Notes (Signed)
DAR NOTE: Patient presents with anxious affect and depressed mood.  Denies pain, auditory and visual hallucinations.  Rates depression at 0, hopelessness at 0, and anxiety at 0.  Maintained on routine safety checks.  Medications given as prescribed.  Support and encouragement offered as needed.  Attended group and participated.  .  Patient observed socializing with peers in the dayroom.  Offered no complaint.

## 2015-12-20 NOTE — BHH Group Notes (Signed)
[facility] LCSW Group Therapy  12/20/2015 , 2:21 PM   Type of Therapy:  Group Therapy  Participation Level:  Active  Participation Quality:  Attentive  Affect:  Appropriate  Cognitive:  Alert  Insight:  Improving  Engagement in Therapy:  Engaged  Modes of Intervention:  Discussion, Exploration and Socialization  Summary of Progress/Problems: Today's group focused on the term Diagnosis.  Participants were asked to define the term, and then pronounce whether it is a negative, positive or neutral term.  Started out well, but quickly disintegrated and he began blurting out statements about the civil war and "setting up an office for the ACT team here."  He was unable to stop himself, and so I excused him from group.  He left on his own accord.  Roque Lias B 12/20/2015 , 2:21 PM

## 2015-12-20 NOTE — Tx Team (Signed)
Interdisciplinary Treatment Plan Update (Adult)  Date:  12/20/2015   Time Reviewed:  9:59 AM   Progress in Treatment: Attending groups: Yes. Participating in groups:  Yes. Taking medication as prescribed:  Yes. Tolerating medication:  Yes. Family/Significant other contact made: Yes  ACT team and parents Patient understands diagnosis:  No  Limited insight Discussing patient identified problems/goals with staff:  Yes, see initial care plan. Medical problems stabilized or resolved:  Yes. Denies suicidal/homicidal ideation: Yes. Issues/concerns per patient self-inventory:  No. Other:  New problem(s) identified:  Discharge Plan or Barriers: see below  Reason for Continuation of Hospitalization: Medication stabilization Disorganization, Delusions  Comments:  Patient presents disorganized , has limited insight, is a poor historian. Collateral information was obtained from ACTT. Will continue medication readjustment. Will cross taper Zyprexa with Invega . Plan to DC pt on Invega sustenna IM - if he tolerates it well. Will not restart Haldol at this time. Will continue Cogentin 0.5 mg po qhs for EPS. Will continue Gabapentin 300 mg po bid for anxiety sx. Will continue Remeron 15 mg po qhs for affective sx as well as sleep.  12/08/15: Patient continues to present as disorganized ,delusional. Pt also has labile mood, requires a lot of redirection on the unit. Will continue medication readjustment.  Will increase Invega to 9 mg po qhs. Plan to DC pt on Invega sustenna IM - if he tolerates it well. Will provide Invega sustenna 234 mg IM tomorrow. Will not restart Haldol at this time. Will continue Cogentin 0.5 mg po qhs for EPS. Will continue Gabapentin 300 mg po bid for anxiety sx. Will continue Remeron 15 mg po qhs for affective sx as well as sleep.  12/13/15: Will continue Invega sustenna IM- 234 mg x 1 dose given 12/08/15, Invega sustenna 156 mg IM to be given on 12/12/15. AIMS -  0 ( 12/12/15) Will discontinue Invega po . Will restart Haldol - start with a low dose of 5 mg po qhs for augmenting the effect of Invega sustenna . Will continue Cogentin 0.5 mg po qhs for EPS. Will continue Gabapentin 300 mg po bid for anxiety sx. Will continue Remeron 15 mg po qhs for affective sx as well as sleep.  12/15/15: Pt is currently restarted on Haldol , dose increased to 10 mg po qhs today for augmenting the effect of Invega. Pt on admission was on two antipsychotics- although he was not compliant.  Will continue Cogentin 0.5 mg po qhs for EPS. Will continue Gabapentin 300 mg po bid for anxiety sx. Will continue Remeron 15 mg po qhs for affective sx as well as sleep.  12/20/15:  No med changes since last treatment plan.  Estimated length of stay: 2-3 days days  New goal(s):  Review of initial/current patient goals per problem list:   Review of initial/current patient goals per problem list:  1. Goal(s): Patient will participate in aftercare plan   Met: Yes   Target date: 3-5 days post admission date   As evidenced by: Patient will participate within aftercare plan AEB aftercare provider and housing plan at discharge being identified.  12/06/15: Return home, follow up outpt    5. Goal(s): Patient will demonstrate decreased signs of psychosis  * Met: Progressing  * Target date: 3-5 days post admission date  * As evidenced by: Patient will demonstrate decreased frequency of AVH or return to baseline function 12/06/15:  Bizarre, disorganized, poor historian 12/08/15:  Speech is now clearer, intelligible, and pt is more goal directed.  However, remains disorganized, lacking insight and displaying mood lability.  Upset tha he cannot go home on pass to get his shoes 12/13/15:  Continues to be disorganized, tangential with flight of ideas 12/15/15:  Is now thinking more clearly, expressing self well.  Less mood lability. Tends to be grumpier in the AM 12/20/15:   Mood stable; delusions are lessening, and pt is able to carry on meaningful conversation for longer periods of time          Attendees: Patient:  12/20/2015 9:59 AM   Family:   12/20/2015 9:59 AM   Physician:  [name], MD 12/20/2015 9:59 AM   Nursing:   Lytle Michaels, RN 12/20/2015 9:59 AM   CSW:    Roque Lias, LCSW   12/20/2015 9:59 AM   Other:  12/20/2015 9:59 AM   Other:   12/20/2015 9:59 AM   Other:  Lars Pinks, Nurse CM 12/20/2015 9:59 AM   Other:   12/20/2015 9:59 AM   Other:  Norberto Sorenson, [place]  12/20/2015 9:59 AM   Other:  12/20/2015 9:59 AM   Other:  12/20/2015 9:59 AM   Other:  12/20/2015 9:59 AM   Other:  12/20/2015 9:59 AM   Other:  12/20/2015 9:59 AM   Other:   12/20/2015 9:59 AM    Scribe for Treatment Team:   Trish Mage, 12/20/2015 9:59 AM

## 2015-12-20 NOTE — Progress Notes (Signed)
Adult Psychoeducational Group Note  Date:  12/20/2015 Time:  8:32 PM  Group Topic/Focus:  Wrap-Up Group:   The focus of this group is to help patients review their daily goal of treatment and discuss progress on daily workbooks.  Participation Level:  Active  Participation Quality:  Appropriate  Affect:  Appropriate  Cognitive:  Appropriate  Insight: Appropriate  Engagement in Group:  Engaged  Modes of Intervention:  Discussion  Additional Comments:  The patient expressed that he attended group.The patient also said that in group he learn how to deal with peer pressure and obstacles.  Nash Shearer 12/20/2015, 8:32 PM

## 2015-12-21 NOTE — Progress Notes (Signed)
Adult Psychoeducational Group Note  Date:  12/21/2015 Time:  8:50 PM  Group Topic/Focus:  Wrap-Up Group:   The focus of this group is to help patients review their daily goal of treatment and discuss progress on daily workbooks.  Participation Level:  Active  Participation Quality:  Inattentive  Affect:  Not Congruent  Cognitive:  Disorganized  Insight: Limited  Engagement in Group:  Off Topic  Modes of Intervention:  Activity  Additional Comments:  Patient rated his day a 8. He does not have a goal.  David [name] 12/21/2015, 8:50 PM

## 2015-12-21 NOTE — Progress Notes (Signed)
Adult Psychoeducational Group Note  Date:  12/21/2015 Time:  1:47 PM  Group Topic/Focus:  Personal Choices and Values:   The focus of this group is to help patients assess and explore the importance of values in their lives, how their values affect their decisions, how they express their values and what opposes their expression.  Participation Level:  Active  Participation Quality:  Appropriate  Affect:  Appropriate  Cognitive:  Appropriate  Insight: Lacking  Engagement in Group:  Limited and Off Topic  Modes of Intervention:  Discussion and Education  Additional Comments:  Pt was able to attend group this morning but was constantly off topic and had to be redirected numerous times.  Allona Gondek E 12/21/2015, 1:47 PM

## 2015-12-21 NOTE — Progress Notes (Signed)
Recreation Therapy Notes  12.21.2016 @ approximately 2:30pm LRT returned to work with patient, as with previous encounter patient appeared to have more interest in socializing with LRT than participating in activity. Patient did initially express interest in building with a deck of cards, however once first attempt failed patient simply wanted to engage in conversation with LRT. Patient shared that the ruler of "[employer] [place]" wants to have a death match with the chaplin that came to the unit. Patient additionally shared that "the [place]" now has three rulers and he expects other patients on the unit to state that have rulers and empires because everyone wants one. Patient additionally shared that he has been calling the [facility] to suggest that the [place] enter beauty contests once she is not in the [employer] any longer. Patient presented a piece of paper with the number for the [facility] on it. Patient reported that a peer has been helping him make attempts to call the [facility].   Laureen Ochs Kieanna Rollo, LRT/CTRS   [name] 12/21/2015 8:04 PM

## 2015-12-21 NOTE — Progress Notes (Signed)
[facility] MD Progress Note  12/21/2015 2:21 PM David [name]  MRN:  [id number]   Subjective: Patient states "I am fine."    Objective:Patient is a 19 y old CM , who has a hx of schizophrenia , who presented to [place]- for hearing AH as well as asking to go to [facility].  Patient seen and chart reviewed today .Discussed patient with treatment team. [name] is awake, alert and oriented X3 .  Patient seen as anxious , vaguely irritable , although he is redirectable - pt is focussed on getting discharged and following up with ACTT. However , mother being the legal guardian - wants patient to be placed at an ALF or [facility]. CSW has started working on it. Per staff - pt is impulsive , intrusive , has been calling family members and talking about delusional stuff, and refused his Haldol last night. Pt provided with medication education- agrees to comply.   Principal Problem: Paranoid schizophrenia ([place]) Diagnosis:   Patient Active Problem List   Diagnosis Date Noted  . 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 History of idiopathic thrombocytopenic purpura [Z86.2] 12/05/2015  . Paranoid schizophrenia ([place]) [F20.0] 12/03/2015  . HTN (hypertension) [I10] 03/04/2012  . GERD (gastroesophageal reflux disease) [K21.9] 03/04/2012  . Insomnia [G47.00] 03/04/2012  . CKD (chronic kidney disease) stage 2, GFR 60-89 ml/min [N18.2] 03/04/2012  . Schizophrenia, paranoid, chronic ([place]) [F20.0] 02/18/2012   Total Time spent with patient: 25 minutes  Past Psychiatric History: Pt was admitted to [facility] - schizophrenia - in 2013, was discharged on Haldol decanoate IM - and was referred to [facility].Pt also had recent hospitalization at [facility] - April 2016. Pt has an ACTT.  Past Medical History:  Past Medical History  Diagnosis Date  . Schizophrenia ([place])   . Pneumonia   . Anxiety   . GERD (gastroesophageal reflux disease)   . COPD (chronic obstructive pulmonary disease) [facility])     Past Surgical History  Procedure  Laterality Date  . Tonsillectomy     Family History:  Family History  Problem Relation Age of Onset  . Arthritis Mother   . Hypertension Mother    Family Psychiatric  History: Pt did not express any hx of mental illness in family. Social History: Pt lives by self in [place], [place]. Pt is single. History  Alcohol Use No     History  Drug Use No    Comment: denies    Social History   Social History  . Marital Status: Legally Separated    Spouse Name: N/A  . Number of Children: N/A  . Years of Education: N/A   Social History Main Topics  . Smoking status: Current Every Day Smoker -- 1.00 packs/day for 40 years    Types: Cigarettes    Start date: 04/14/1974  . Smokeless tobacco: Never Used  . Alcohol Use: No  . Drug Use: No     Comment: denies  . Sexual Activity: No   Other Topics Concern  . None   Social History Narrative   Additional Social History:   Sleep: Fair  Appetite:  Fair  Current Medications: Current Facility-Administered Medications  Medication Dose Route Frequency Provider Last Rate Last Dose  . acetaminophen (TYLENOL) tablet 650 mg  650 mg Oral Q6H PRN Derrill Center, NP   650 mg at 12/19/15 0940  . albuterol (PROVENTIL HFA;VENTOLIN HFA) 108 (90 BASE) MCG/ACT inhaler 2 puff  2 puff Inhalation Q6H PRN Derrill Center, NP   2 puff at 12/14/15  0802  . alum & mag hydroxide-simeth (MAALOX/MYLANTA) 200-200-20 MG/5ML suspension 30 mL  30 mL Oral Q4H PRN Derrill Center, NP   30 mL at 12/19/15 1118  . benztropine (COGENTIN) tablet 0.5 mg  0.5 mg Oral QHS Derrill Center, NP   0.5 mg at 12/20/15 2114  . feeding supplement (ENSURE ENLIVE) (ENSURE ENLIVE) liquid 237 mL  237 mL Oral BID BM Aleda Madl, MD   237 mL at 12/21/15 1406  . gabapentin (NEURONTIN) capsule 300 mg  300 mg Oral BID Derrill Center, NP   300 mg at 12/21/15 0945  . haloperidol (HALDOL) tablet 12 mg  12 mg Oral QHS [name], MD   12 mg at 12/20/15 2114  . hydrOXYzine (ATARAX/VISTARIL)  tablet 25 mg  25 mg Oral QHS,MR X 1 Derrill Center, NP   25 mg at 12/20/15 2307  . lidocaine (LIDODERM) 5 % 1 patch  1 patch Transdermal Q24H [name], MD   1 patch at 12/21/15 [id number]  . LORazepam (ATIVAN) tablet 1 mg  1 mg Oral BID PRN [name], MD   1 mg at 12/20/15 2328  . losartan (COZAAR) tablet 50 mg  50 mg Oral Daily Derrill Center, NP   50 mg at 12/21/15 0946  . magnesium hydroxide (MILK OF MAGNESIA) suspension 30 mL  30 mL Oral Daily PRN Derrill Center, NP   30 mL at 12/05/15 0239  . menthol-cetylpyridinium (CEPACOL) lozenge 3 mg  1 lozenge Oral PRN [name], MD   3 mg at 12/19/15 1117  . mirtazapine (REMERON) tablet 15 mg  15 mg Oral QHS PRN Derrill Center, NP   15 mg at 12/19/15 2311  . nicotine (NICODERM CQ - dosed in mg/24 hours) patch 14 mg  14 mg Transdermal Daily [name], MD   14 mg at 12/21/15 0952  . OLANZapine (ZYPREXA) tablet 10 mg  10 mg Oral TID PRN [name], MD   10 mg at 12/18/15 0006   Or  . OLANZapine (ZYPREXA) injection 10 mg  10 mg Intramuscular TID PRN [name], MD      . Derrill Memo ON 01/10/2016] paliperidone (INVEGA SUSTENNA) injection 156 mg  156 mg Intramuscular Q28 days [name], MD      . pantoprazole (PROTONIX) EC tablet 40 mg  40 mg Oral Daily Derrill Center, NP   40 mg at 12/21/15 0946  . tuberculin injection 5 Units  5 Units Intradermal Once [name], MD   5 Units at 12/19/15 1837    Lab Results:  No results found for this or any previous visit (from the past 48 hour(s)).  Physical Findings: AIMS: Facial and Oral Movements Muscles of Facial Expression: None, normal Lips and Perioral Area: None, normal Jaw: None, normal Tongue: None, normal,Extremity Movements Upper (arms, wrists, hands, fingers): None, normal Lower (legs, knees, ankles, toes): None, normal, Trunk Movements Neck, shoulders, hips: None, normal, Overall Severity Severity of abnormal movements (highest score from questions above): None,  normal Incapacitation due to abnormal movements: None, normal Patient's awareness of abnormal movements (rate only patient's report): No Awareness, Dental Status Current problems with teeth and/or dentures?: No Does patient usually wear dentures?: No  CIWA:  CIWA-Ar Total: 1 COWS:  COWS Total Score: 3  Musculoskeletal: Strength & Muscle Tone: within normal limits Gait & Station: normal Patient leans: N/A  Psychiatric Specialty Exam: Review of Systems  Constitutional: Negative.   HENT: Negative.   Eyes: Negative.   Respiratory:  Negative.   Cardiovascular: Negative.   Genitourinary: Negative.   Musculoskeletal: Negative.   Skin: Negative.   Neurological: Negative.   Endo/Heme/Allergies: Negative.   Psychiatric/Behavioral: Positive for hallucinations (delusions). Negative for suicidal ideas. The patient is nervous/anxious.        Paranoia and delusions  All other systems reviewed and are negative.   Blood pressure 154/79, pulse 107, temperature 98 F (36.7 C), temperature source Oral, resp. rate 17, height 5\' 10"  (1.778 m), weight 86.183 kg (190 lb).Body mass index is 27.26 kg/(m^2).  General Appearance: Casual and Fairly Groomed   Eye Contact::  Good   Speech:  Normal Rate  Volume:  Normal   Mood:  Angry and Anxious  Irritable- improving  Affect:  Labile with some improvement  Thought Process:  Disorganized and Loose with some improvement  Orientation:  Full (Time, Place, and Person)  Thought Content:  Delusions and Rumination talks about irrelevant stuff- but improved since admission  Suicidal Thoughts:  No  Homicidal Thoughts:  No  Memory:  Immediate;   Fair Recent;   Fair Remote;   Poor  Judgement:  Impaired  Insight:  Shallow  Psychomotor Activity:  Normal  Concentration:  Fair  Recall:  [place]: Fair  Akathisia:  No  Handed:  Right  AIMS (if indicated):   0  Assets:  Desire for Improvement Social Support Others:  access to  health care  ADL's:  Intact  Cognition: WNL   Sleep:  Number of Hours: 5.75      Treatment Plan: Patient continues to improve, although has delusions and tangential thought process- will continue treatment.Patient to have supervised phone calls . Daily contact with patient to assess and evaluate symptoms and progress in treatment and Medication management   Pt received Invega sustenna IM-  234 mg x 1 dose given 12/08/15,  Invega sustenna 156 mg IM  -  12/12/15. AIMS - 0 ( 12/21/15) Pt is currently restarted on Haldol , dose increased to 12 mg po qhs  for augmenting the effect of Invega. Pt on admission was on two antipsychotics- although he was not compliant.  Continue Cogentin 0.5 mg po qhs for EPS. Continue Gabapentin  300 mg po bid for anxiety sx. Continue Remeron 15 mg po qhs for affective sx as well as sleep.Continue Vistaril 25 mg PRN with x1 Repeat or insomnia Will continue to monitor vitals ,medication compliance and treatment side effects while patient is here.  Will monitor for medical issues as well as call consult as needed.   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 Pt also has a hx of CKD- Creatinine chronically elevated. TSH - wnl ,lipid panel- wnl ,  hba1c- wnl , PL is elevated - likely 2/2 neuroleptics- will monitor.  CSW will start working on disposition. Pt to be referred to ALF for placement - Mother is the legal guardian.Provided PPD for [facility] placement- 12/19/15. Continue Recreational therapy consult . Patient to participate in therapeutic milieu   Jaslene Marsteller MD 12/21/2015, 2:21 PM

## 2015-12-21 NOTE — BHH Group Notes (Signed)
[facility] LCSW Group Therapy  12/21/2015 2:56 PM   Type of Therapy:  Group Therapy  Participation Level:  Active  Participation Quality:  Attentive  Affect:  Appropriate  Cognitive:  Appropriate  Insight:  Improving  Engagement in Therapy:  Engaged  Modes of Intervention:  Clarification, Education, Exploration and Socialization  Summary of Progress/Problems: Today's group focused on relapse prevention.  We defined the term, and then brainstormed on ways to prevent relapse.  Unable to track.  Making random comments about "the empire" and "sun eating the earth" and "central prison."  Difficult to redirect.  Another patient worked hard to remind him that they could talk later about what was on his mind, but right now he needed to pay attention in group.  He reluctantly agreed.  David [name] B 12/21/2015 , 2:56 PM

## 2015-12-21 NOTE — Plan of Care (Signed)
Problem: Aggression Towards others,Towards Self, and or Destruction Goal: STG-Patient will comply with prescribed medication regimen (Patient will comply with prescribed medication regimen)  Outcome: Progressing Patient is compliant with medication regime.

## 2015-12-21 NOTE — Progress Notes (Signed)
DAR NOTE: Patient is anxious and irritable.  Patient continues to make calls to different people.  Patient redirected several times.  Ativan 1 mg given for agitation and anxiety with good effect.  Denies pain, auditory and visual hallucinations.  Rates depression at 0, hopelessness at 0, and anxiety at 2.  Described energy level as normal and concentration as good.  Maintained on routine safety checks.  Medications given as prescribed.  Support and encouragement offered as needed.  Attended group and participated.  States goal for today is "compromise with some other leaders."  Patient observed socializing with peers in the dayroom.  Offered no complaint.

## 2015-12-22 MED ORDER — LORAZEPAM 0.5 MG PO TABS
0.5000 mg | ORAL_TABLET | Freq: Two times a day (BID) | ORAL | Status: DC | PRN
Start: 2015-12-22 — End: 2015-12-23
  Administered 2015-12-22: 0.5 mg via ORAL
  Filled 2015-12-22: qty 1

## 2015-12-22 NOTE — Progress Notes (Signed)
Adult Psychoeducational Group Note  Date:  12/22/2015 Time:  8:20 PM  Group Topic/Focus:  Wrap-Up Group:   The focus of this group is to help patients review their daily goal of treatment and discuss progress on daily workbooks.  Participation Level:  Minimal  Participation Quality:  Appropriate  Affect:  Appropriate  Cognitive:  Appropriate  Insight: Appropriate  Engagement in Group:  Engaged  Modes of Intervention:  Discussion  Additional Comments:  Pt rated overall day a 7 out of 10 because "it was just a normal day". Pt reported that he did not have a goal for the day and "nothing negative happened today".   Lincoln Brigham 12/22/2015, 8:54 PM

## 2015-12-22 NOTE — Progress Notes (Signed)
D) Pt affect has been blunted, blank, sullen at times. Pt has been seclusive to self and isolative to his room, mostly sleeping. Pt is positive for groups with prompting although participation is minimal. Pt has not asked to use the phone today and is interacting very little. Pt has c/o increased anxiety and asked for PRN Ativan times two. Pt denies avh but thoughts are disorganized with some thought blocking present. A) Level 3 obs supported, prompting and limit setting as needed. Med ed reinforced. R) Safety maintained.

## 2015-12-22 NOTE — Progress Notes (Signed)
D: David [name] denies SI/HI/AVH. He requested Ativan early in the shift stating his thoughts have been racing. He states he is still upset that he is being "forced" to go to a group home when he is discharged from [facility]. He is asking is Vaughan Basta still here, however there is no one named Vaughan Basta on the unit nor was there a person named Vaughan Basta on the previous shift. He is more reserved today with minimal interaction.  A: Q15 minute checks for patient safety. Medications administered as prescribed. Encouragement and support given.  R: Continue to monitor for patient safety and medication effectiveness.

## 2015-12-22 NOTE — Progress Notes (Signed)
D: Upon my initial greeting David [name] stated "I'm not talking to you". Later that evening he begins to converse with me. Our conversation started out with Wynonia Lawman talking about all the street drugs he has taken in his life and how he needs to take his pink medications later after he takes his blue ones because the blue ones are for men and he has to think about taking the pink ones that are for girls. Then the conversation switched to floods and pipes and oil spills. He denies SI/HI/AVH.  A: Q15 minute checks for patient safety. Medications administered as prescribed. Encouragement and support given. R: Continue to monitor patient for safety and medication effectiveness.

## 2015-12-22 NOTE — Progress Notes (Signed)
[facility] MD Progress Note  12/22/2015 1:35 PM David [name]  MRN:  [id number]   Subjective: Patient states "I am fine , when can I go home. I have an ACTT - you can call them."     Objective:Patient is a 37 y old CM , who has a hx of schizophrenia , who presented to [facility]- for hearing AH as well as asking to go to [name].  Patient seen and chart reviewed today .Discussed patient with treatment team. David [name] is awake, alert and oriented X3 .  Pt today seen in bed , reported he is not feeling good initially , but became more pleasant and was able to talk to writer thereafter. Pt denied any new concerns and did not focus on delusional thoughts too much this AM. Pt per staff has had no disruptive issues on the unit - except for him making several phone calls through out the day to several family members as well as others. However, currently his phone calls are supervised closely and he has not had any more issues. Pt per staff does talk about irrelevant, delusonal thoughts during conversations - but otherwise , he is able to engage better and participate better than on admission.    Principal Problem: Paranoid schizophrenia ([place]) Diagnosis:   Patient Active Problem List   Diagnosis Date Noted  . 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 History of idiopathic thrombocytopenic purpura [Z86.2] 12/05/2015  . Paranoid schizophrenia ([place]) [F20.0] 12/03/2015  . HTN (hypertension) [I10] 03/04/2012  . GERD (gastroesophageal reflux disease) [K21.9] 03/04/2012  . Insomnia [G47.00] 03/04/2012  . CKD (chronic kidney disease) stage 2, GFR 60-89 ml/min [N18.2] 03/04/2012  . Schizophrenia, paranoid, chronic ([place]) [F20.0] 02/18/2012   Total Time spent with patient: 25 minutes  Past Psychiatric History: Pt was admitted to [facility] - schizophrenia - in 2013, was discharged on Haldol decanoate IM - and was referred to [facility].Pt also had recent hospitalization at [facility] - April 2016. Pt has an ACTT.  Past  Medical History:  Past Medical History  Diagnosis Date  . Schizophrenia ([place])   . Pneumonia   . Anxiety   . GERD (gastroesophageal reflux disease)   . COPD (chronic obstructive pulmonary disease) [facility])     Past Surgical History  Procedure Laterality Date  . Tonsillectomy     Family History:  Family History  Problem Relation Age of Onset  . Arthritis Mother   . Hypertension Mother    Family Psychiatric  History: Pt did not express any hx of mental illness in family. Social History: Pt lives by self in [place], [place]. Pt is single. History  Alcohol Use No     History  Drug Use No    Comment: denies    Social History   Social History  . Marital Status: Legally Separated    Spouse Name: N/A  . Number of Children: N/A  . Years of Education: N/A   Social History Main Topics  . Smoking status: Current Every Day Smoker -- 1.00 packs/day for 40 years    Types: Cigarettes    Start date: 04/14/1974  . Smokeless tobacco: Never Used  . Alcohol Use: No  . Drug Use: No     Comment: denies  . Sexual Activity: No   Other Topics Concern  . None   Social History Narrative   Additional Social History:   Sleep: Fair  Appetite:  Fair  Current Medications: Current Facility-Administered Medications  Medication Dose Route Frequency Provider Last Rate Last Dose  .  acetaminophen (TYLENOL) tablet 650 mg  650 mg Oral Q6H PRN Derrill Center, NP   650 mg at 12/21/15 1823  . albuterol (PROVENTIL HFA;VENTOLIN HFA) 108 (90 BASE) MCG/ACT inhaler 2 puff  2 puff Inhalation Q6H PRN Derrill Center, NP   2 puff at 12/14/15 0802  . alum & mag hydroxide-simeth (MAALOX/MYLANTA) 200-200-20 MG/5ML suspension 30 mL  30 mL Oral Q4H PRN Derrill Center, NP   30 mL at 12/19/15 1118  . benztropine (COGENTIN) tablet 0.5 mg  0.5 mg Oral QHS Derrill Center, NP   0.5 mg at 12/21/15 2148  . feeding supplement (ENSURE ENLIVE) (ENSURE ENLIVE) liquid 237 mL  237 mL Oral BID BM David Tep, MD   237 mL at  12/21/15 1406  . gabapentin (NEURONTIN) capsule 300 mg  300 mg Oral BID Derrill Center, NP   300 mg at 12/22/15 0810  . haloperidol (HALDOL) tablet 12 mg  12 mg Oral QHS [name], MD   12 mg at 12/21/15 2149  . hydrOXYzine (ATARAX/VISTARIL) tablet 25 mg  25 mg Oral QHS,MR X 1 Derrill Center, NP   25 mg at 12/21/15 2252  . lidocaine (LIDODERM) 5 % 1 patch  1 patch Transdermal Q24H David Cradle, MD   1 patch at 12/21/15 [id number]  . LORazepam (ATIVAN) tablet 0.5 mg  0.5 mg Oral BID PRN [name], MD      . losartan (COZAAR) tablet 50 mg  50 mg Oral Daily Derrill Center, NP   50 mg at 12/22/15 0810  . magnesium hydroxide (MILK OF MAGNESIA) suspension 30 mL  30 mL Oral Daily PRN Derrill Center, NP   30 mL at 12/05/15 0239  . menthol-cetylpyridinium (CEPACOL) lozenge 3 mg  1 lozenge Oral PRN [name], MD   3 mg at 12/19/15 1117  . mirtazapine (REMERON) tablet 15 mg  15 mg Oral QHS PRN Derrill Center, NP   15 mg at 12/19/15 2311  . nicotine (NICODERM CQ - dosed in mg/24 hours) patch 14 mg  14 mg Transdermal Daily [name], MD   14 mg at 12/22/15 0813  . OLANZapine (ZYPREXA) tablet 10 mg  10 mg Oral TID PRN [name], MD   10 mg at 12/22/15 1306   Or  . OLANZapine (ZYPREXA) injection 10 mg  10 mg Intramuscular TID PRN [name], MD      . Derrill Memo ON 01/10/2016] paliperidone (INVEGA SUSTENNA) injection 156 mg  156 mg Intramuscular Q28 days [name], MD      . pantoprazole (PROTONIX) EC tablet 40 mg  40 mg Oral Daily Derrill Center, NP   40 mg at 12/22/15 [id number]    Lab Results:  No results found for this or any previous visit (from the past 48 hour(s)).  Physical Findings: AIMS: Facial and Oral Movements Muscles of Facial Expression: None, normal Lips and Perioral Area: None, normal Jaw: None, normal Tongue: None, normal,Extremity Movements Upper (arms, wrists, hands, fingers): None, normal Lower (legs, knees, ankles, toes): None, normal, Trunk Movements Neck,  shoulders, hips: None, normal, Overall Severity Severity of abnormal movements (highest score from questions above): None, normal Incapacitation due to abnormal movements: None, normal Patient's awareness of abnormal movements (rate only patient's report): No Awareness, Dental Status Current problems with teeth and/or dentures?: No Does patient usually wear dentures?: No  CIWA:  CIWA-Ar Total: 0 COWS:  COWS Total Score: 3  Musculoskeletal: Strength & Muscle Tone: within normal limits Gait &  Station: normal Patient leans: N/A  Psychiatric Specialty Exam: Review of Systems  Constitutional: Negative.   HENT: Negative.   Eyes: Negative.   Respiratory: Negative.   Cardiovascular: Negative.   Genitourinary: Negative.   Musculoskeletal: Negative.   Skin: Negative.   Neurological: Negative.   Endo/Heme/Allergies: Negative.   Psychiatric/Behavioral: Positive for hallucinations (delusions). Negative for suicidal ideas. The patient is nervous/anxious.        Paranoia and delusions  All other systems reviewed and are negative.   Blood pressure 131/72, pulse 97, temperature 98.4 F (36.9 C), temperature source Oral, resp. rate 16, height 5\' 10"  (1.778 m), weight 86.183 kg (190 lb).Body mass index is 27.26 kg/(m^2).  General Appearance: Casual and Fairly Groomed   Eye Contact::  Good   Speech:  Normal Rate  Volume:  Normal   Mood:  Anxious  Irritable- improving  Affect:  Appropriate with some improvement  Thought Process:  Irrelevant with some improvement  Orientation:  Full (Time, Place, and Person)  Thought Content:  Delusions and Rumination talks about irrelevant stuff- but improved since admission  Suicidal Thoughts:  No  Homicidal Thoughts:  No  Memory:  Immediate;   Fair Recent;   Fair Remote;   Poor  Judgement:  Impaired  Insight:  Shallow  Psychomotor Activity:  Normal  Concentration:  Fair  Recall:  [place]: Fair  Akathisia:  No  Handed:   Right  AIMS (if indicated):     Assets:  Desire for Improvement Social Support Others:  access to health care  ADL's:  Intact  Cognition: WNL   Sleep:  Number of Hours: 5.2      Treatment Plan: Patient continues to improve, although has delusions and tangential thought process- will continue treatment.Patient to have supervised phone calls . Daily contact with patient to assess and evaluate symptoms and progress in treatment and Medication management   Pt received Invega sustenna IM-  234 mg x 1 dose given 12/08/15,  Invega sustenna 156 mg IM  -  12/12/15. AIMS - 0 ( 12/21/15) Pt is currently restarted on Haldol , dose increased to 12 mg po qhs  for augmenting the effect of Invega. Pt on admission was on two antipsychotics- although he was not compliant.  Continue Cogentin 0.5 mg po qhs for EPS. Continue Gabapentin  300 mg po bid for anxiety sx. Continue Remeron 15 mg po qhs for affective sx as well as sleep.Continue Vistaril 25 mg PRN with x1 Repeat or insomnia Will continue to monitor vitals ,medication compliance and treatment side effects while patient is here.  Will monitor for medical issues as well as call consult as needed.   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 Pt also has a hx of CKD- Creatinine chronically elevated. TSH - wnl ,lipid panel- wnl ,  hba1c- wnl , PL is elevated - likely 2/2 neuroleptics- will monitor.  CSW will start working on disposition. Pt to be referred to ALF for placement - Mother is the legal guardian.Provided PPD for [facility] placement- 12/19/15. Continue Recreational therapy consult . Patient to participate in therapeutic milieu   Alezandra Egli MD 12/22/2015, 1:35 PM

## 2015-12-22 NOTE — Progress Notes (Signed)
Recreation Therapy Notes  12.22.2016 @ approximately 3:00pm LRT approached patient in dayroom. Patient immediately engaged in conversation with LRT expressing concerns that he will d/c to a group home. Patient stated he did not understand why he could not return home, as he has a home and does not want to live in an unfamiliar place with unfamiliar people. Peers in [place] were dancing to Christmas music patient was invited to join peers, patient declined offer and stated he did not feel up to it today. At this time patient shook LRT hand and thanked her for talking with him today.   Laureen Ochs Emmani Lesueur, LRT/CTRS    [name] 12/22/2015 3:36 PM

## 2015-12-22 NOTE — BHH Group Notes (Signed)
[place] Group Notes:  (Counselor/Nursing/MHT/Case Management/Adjunct)  12/22/2015 1:15PM  Type of Therapy:  Group Therapy  Participation Level:  Active  Participation Quality:  Appropriate  Affect:  Flat  Cognitive:  Oriented  Insight:  Improving  Engagement in Group:  Limited  Engagement in Therapy:  Limited  Modes of Intervention:  Discussion, Exploration and Socialization  Summary of Progress/Problems: The topic for group was balance in life.  Pt participated in the discussion about when their life was in balance and out of balance and how this feels.  Pt discussed ways to get back in balance and short term goals they can work on to get where they want to be. Stayed the entire time.  No demonstrated mood lability today, and no overtly psychotic statements.  States he is balanced today "because I am with good people here-I can just feel it."  Talked about using writing and journaling to get organized, and subsequently balanced.   Trish Mage 12/22/2015 3:38 PM

## 2015-12-23 MED ORDER — OLANZAPINE 10 MG IM SOLR: 5.0000 mg | Freq: Three times a day (TID) | INTRAMUSCULAR | Status: DC | PRN

## 2015-12-23 MED ORDER — NICOTINE 21 MG/24HR TD PT24
21.0000 mg | MEDICATED_PATCH | Freq: Every day | TRANSDERMAL | Status: DC
  Administered 2015-12-23 – 2015-12-30 (×8): 21 mg via TRANSDERMAL
  Filled 2015-12-23 (×10): qty 1

## 2015-12-23 MED ORDER — LORAZEPAM 1 MG PO TABS
1.0000 mg | ORAL_TABLET | Freq: Two times a day (BID) | ORAL | Status: DC | PRN
  Administered 2015-12-23 – 2015-12-29 (×10): 1 mg via ORAL
  Filled 2015-12-23 (×10): qty 1

## 2015-12-23 MED ORDER — OLANZAPINE 5 MG PO TABS
5.0000 mg | ORAL_TABLET | Freq: Three times a day (TID) | ORAL | Status: DC | PRN
  Administered 2015-12-23 – 2015-12-28 (×4): 5 mg via ORAL
  Filled 2015-12-23 (×4): qty 2

## 2015-12-23 NOTE — Progress Notes (Signed)
D) Pt. Affect scowled and unpleasant much of the shift.  Pt. Mood irritable and interactions with staff are brief.  Pt. Visited by Group Home employees to consider placement.  Pt. Had c/o HA and anxiety after lunch. Pt. Focused on pimple on left side of neck, concerned it was something more significant.   A) Pt. Offered emotional support and comfort measures.  Pt. Offered medications as ordered.  Given Tylenol for HA 4/10 and Ativan for anxiety 6/10. Encouraged to use warm compress for pimple, but chose cool pack for comfort to that area.  R) Pt. Reports that the group home is not going to work out because they want him to stay for one year, and he reports he is unwilling to do that.  Pt reports HA is now 2/10 and reports that anxiety was decreased somewhat.

## 2015-12-23 NOTE — Progress Notes (Signed)
D: David [name] states his goal was to go home today and he was very upset that he was not able to go home. He states people are lying on him and that is what is holding up his discharge. He denies SI/HI/AVH and became more irritated with further questioning regarding how the rest of his day went. Mood continues to be labile.  A: Q15 minute checks for patient safety. Medications administered as prescribed. Encouragement and support given. R: Continue to monitor for patient safety and medication effectiveness.

## 2015-12-23 NOTE — Progress Notes (Signed)
Patient ID: David [name], male   DOB: 09-20-1956, 59 y.o.   MRN: [id number] PER STATE REGULATIONS 482.30  THIS CHART WAS REVIEWED FOR MEDICAL NECESSITY WITH RESPECT TO THE PATIENT'S ADMISSION/ DURATION OF STAY.  NEXT REVIEW DATE: 12/27/2015  Cammie Mcgee, BSN CASE MANAGER'

## 2015-12-23 NOTE — BHH Group Notes (Signed)
[place] LCSW Group Therapy  12/23/2015 1:15 pm  Type of Therapy: Process Group Therapy  Participation Level:  Invited.  Chose to not attend  Summary of Progress/Problems: Today's group addressed the issue of overcoming obstacles.  Patients were asked to identify their biggest obstacle post d/c that stands in the way of their on-going success, and then problem solve as to how to manage this.  David [name] B 12/23/2015   3:04 PM

## 2015-12-23 NOTE — Progress Notes (Signed)
[name] MD Progress Note  12/23/2015 12:48 PM David [name]  MRN:  [id number]   Subjective: Patient states "I am fine , I feel better."     Objective:Patient is a 87 y old CM , who has a hx of schizophrenia , who presented to [place]- for hearing AH as well as asking to go to [facility].  Patient seen and chart reviewed today .Discussed patient with treatment team. David [name] is awake, alert and oriented X3 .  Pt today seen in bed , appears less depressed than yesterday. Pt continues to have periods when he is delusional , however has been progressing . Pt per staff has had no disruptive issues on the unit , tolerating medications well. He did receive Zyprexa prn for anxiety sleep issues last night. Pt has ALF coming to assess him for placement today.      Principal Problem: Paranoid schizophrenia ([place]) Diagnosis:   Patient Active Problem List   Diagnosis Date Noted  . 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 History of idiopathic thrombocytopenic purpura [Z86.2] 12/05/2015  . Paranoid schizophrenia ([place]) [F20.0] 12/03/2015  . HTN (hypertension) [I10] 03/04/2012  . GERD (gastroesophageal reflux disease) [K21.9] 03/04/2012  . Insomnia [G47.00] 03/04/2012  . CKD (chronic kidney disease) stage 2, GFR 60-89 ml/min [N18.2] 03/04/2012  . Schizophrenia, paranoid, chronic ([place]) [F20.0] 02/18/2012   Total Time spent with patient: 25 minutes  Past Psychiatric History: Pt was admitted to [facility] - schizophrenia - in 2013, was discharged on Haldol decanoate IM - and was referred to [facility].Pt also had recent hospitalization at [facility] - April 2016. Pt has an ACTT.  Past Medical History:  Past Medical History  Diagnosis Date  . Schizophrenia ([place])   . Pneumonia   . Anxiety   . GERD (gastroesophageal reflux disease)   . COPD (chronic obstructive pulmonary disease) [facility])     Past Surgical History  Procedure Laterality Date  . Tonsillectomy     Family History:  Family History  Problem  Relation Age of Onset  . Arthritis Mother   . Hypertension Mother    Family Psychiatric  History: Pt did not express any hx of mental illness in family. Social History: Pt lives by self in [place], [place]. Pt is single. History  Alcohol Use No     History  Drug Use No    Comment: denies    Social History   Social History  . Marital Status: Legally Separated    Spouse Name: N/A  . Number of Children: N/A  . Years of Education: N/A   Social History Main Topics  . Smoking status: Current Every Day Smoker -- 1.00 packs/day for 40 years    Types: Cigarettes    Start date: 04/14/1974  . Smokeless tobacco: Never Used  . Alcohol Use: No  . Drug Use: No     Comment: denies  . Sexual Activity: No   Other Topics Concern  . None   Social History Narrative   Additional Social History:   Sleep: Fair  Appetite:  Fair  Current Medications: Current Facility-Administered Medications  Medication Dose Route Frequency Provider Last Rate Last Dose  . acetaminophen (TYLENOL) tablet 650 mg  650 mg Oral Q6H PRN Derrill Center, NP   650 mg at 12/23/15 0006  . albuterol (PROVENTIL HFA;VENTOLIN HFA) 108 (90 BASE) MCG/ACT inhaler 2 puff  2 puff Inhalation Q6H PRN Derrill Center, NP   2 puff at 12/22/15 2114  . alum & mag hydroxide-simeth (MAALOX/MYLANTA) 200-200-20 MG/5ML suspension  30 mL  30 mL Oral Q4H PRN Derrill Center, NP   30 mL at 12/22/15 1723  . benztropine (COGENTIN) tablet 0.5 mg  0.5 mg Oral QHS Derrill Center, NP   0.5 mg at 12/22/15 2115  . feeding supplement (ENSURE ENLIVE) (ENSURE ENLIVE) liquid 237 mL  237 mL Oral BID BM Atharv Barriere, MD   237 mL at 12/23/15 1108  . gabapentin (NEURONTIN) capsule 300 mg  300 mg Oral BID Derrill Center, NP   300 mg at 12/23/15 0858  . haloperidol (HALDOL) tablet 12 mg  12 mg Oral QHS [name], MD   12 mg at 12/22/15 2115  . hydrOXYzine (ATARAX/VISTARIL) tablet 25 mg  25 mg Oral QHS,MR X 1 Derrill Center, NP   25 mg at 12/22/15 2228  .  lidocaine (LIDODERM) 5 % 1 patch  1 patch Transdermal Q24H [name], MD   1 patch at 12/23/15 1109  . LORazepam (ATIVAN) tablet 1 mg  1 mg Oral BID PRN [name], MD      . losartan (COZAAR) tablet 50 mg  50 mg Oral Daily Derrill Center, NP   50 mg at 12/23/15 0858  . magnesium hydroxide (MILK OF MAGNESIA) suspension 30 mL  30 mL Oral Daily PRN Derrill Center, NP   30 mL at 12/05/15 0239  . menthol-cetylpyridinium (CEPACOL) lozenge 3 mg  1 lozenge Oral PRN [name], MD   3 mg at 12/19/15 1117  . mirtazapine (REMERON) tablet 15 mg  15 mg Oral QHS PRN Derrill Center, NP   15 mg at 12/22/15 2115  . nicotine (NICODERM CQ - dosed in mg/24 hours) patch 14 mg  14 mg Transdermal Daily [name], MD   14 mg at 12/23/15 0902  . OLANZapine (ZYPREXA) tablet 5 mg  5 mg Oral TID PRN [name], MD       Or  . OLANZapine (ZYPREXA) injection 5 mg  5 mg Intramuscular TID PRN [name], MD      . Derrill Memo ON 01/10/2016] paliperidone (INVEGA SUSTENNA) injection 156 mg  156 mg Intramuscular Q28 days [name], MD      . pantoprazole (PROTONIX) EC tablet 40 mg  40 mg Oral Daily Derrill Center, NP   40 mg at 12/23/15 [id number]    Lab Results:  No results found for this or any previous visit (from the past 53 hour(s)).  Physical Findings: AIMS: Facial and Oral Movements Muscles of Facial Expression: None, normal Lips and Perioral Area: None, normal Jaw: None, normal Tongue: None, normal,Extremity Movements Upper (arms, wrists, hands, fingers): None, normal Lower (legs, knees, ankles, toes): None, normal, Trunk Movements Neck, shoulders, hips: None, normal, Overall Severity Severity of abnormal movements (highest score from questions above): None, normal Incapacitation due to abnormal movements: None, normal Patient's awareness of abnormal movements (rate only patient's report): No Awareness, Dental Status Current problems with teeth and/or dentures?: No Does patient usually wear  dentures?: No  CIWA:  CIWA-Ar Total: 0 COWS:  COWS Total Score: 3  Musculoskeletal: Strength & Muscle Tone: within normal limits Gait & Station: normal Patient leans: N/A  Psychiatric Specialty Exam: Review of Systems  Constitutional: Negative.   HENT: Negative.   Eyes: Negative.   Respiratory: Negative.   Cardiovascular: Negative.   Genitourinary: Negative.   Musculoskeletal: Negative.   Skin: Negative.   Neurological: Negative.   Endo/Heme/Allergies: Negative.   Psychiatric/Behavioral: Positive for hallucinations (delusions). Negative for suicidal ideas. The patient  is nervous/anxious.        Paranoia and delusions  All other systems reviewed and are negative.   Blood pressure 145/82, pulse 105, temperature 97.9 F (36.6 C), temperature source Oral, resp. rate 18, height 5\' 10"  (1.778 m), weight 86.183 kg (190 lb).Body mass index is 27.26 kg/(m^2).  General Appearance: Casual and Fairly Groomed   Eye Contact::  Good   Speech:  Normal Rate  Volume:  Normal   Mood:  Anxious   improving  Affect:  Appropriate with some improvement  Thought Process:  Irrelevant with some improvement  Orientation:  Full (Time, Place, and Person)  Thought Content:  Delusions and Rumination talks about irrelevant stuff- but improved since admission  Suicidal Thoughts:  No  Homicidal Thoughts:  No  Memory:  Immediate;   Fair Recent;   Fair Remote;   Poor  Judgement:  Impaired  Insight:  Shallow  Psychomotor Activity:  Normal  Concentration:  Fair  Recall:  [place]: Fair  Akathisia:  No  Handed:  Right  AIMS (if indicated):     Assets:  Desire for Improvement Social Support Others:  access to health care  ADL's:  Intact  Cognition: WNL   Sleep:  Number of Hours: 6.75      Treatment Plan: Patient continues to improve, although has delusions and tangential thought process- will continue treatment.Patient to have supervised phone calls . Daily contact  with patient to assess and evaluate symptoms and progress in treatment and Medication management   Pt received Invega sustenna IM-  234 mg x 1 dose given 12/08/15,  Invega sustenna 156 mg IM  -  12/12/15. AIMS - 0 ( 12/21/15) Pt is currently restarted on Haldol , dose increased to 12 mg po qhs  for augmenting the effect of Invega. Pt on admission was on two antipsychotics- although he was not compliant.  Continue Cogentin 0.5 mg po qhs for EPS. Continue Gabapentin  300 mg po bid for anxiety sx. Continue Remeron 15 mg po qhs for affective sx as well as sleep.Continue Vistaril 25 mg PRN with x1 Repeat or insomnia. Will restart Ativan 1 mg po bid prn - for anxiety sx. Will continue to monitor vitals ,medication compliance and treatment side effects while patient is here.  Will monitor for medical issues as well as call consult as needed.   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 Pt also has a hx of CKD- Creatinine chronically elevated. TSH - wnl ,lipid panel- wnl ,  hba1c- wnl , PL is elevated - likely 2/2 neuroleptics- will monitor.  CSW will start working on disposition. Pt to be referred to ALF for placement - Mother is the legal guardian.ALF to assess him today. Continue Recreational therapy consult . Patient to participate in therapeutic milieu   Xayne Brumbaugh MD 12/23/2015, 12:48 PM

## 2015-12-23 NOTE — Progress Notes (Signed)
David [name] is continuously walking up and down the halls. He is having difficulty sleeping and very anxious. He asks me for a piece of paper and then requests that I put in my notes everything he has written on the paper. When I explain to him it would be just as easy for him to give the note in the morning that he has written to the MD he becomes even more agitated and demands I make notes for him so the doctor can read them on the computer. He writes "The protonix helps with my acid reflux. The yellow and white capsule (he currently does not have a yellow and white capsule in his medication bin) seems to help with my anxiety and racing thoughts. The ativan calms some then I take another ativan and a few minutes later take the "vicereal". I need something that will put me out to sleep. No drinking. I do smoke. 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

## 2015-12-24 NOTE — Progress Notes (Signed)
Nursing DAR Note:  Patient is cooperative and denies any SI/HI/AVH; displays minor to moderate amount of unprovoked paranoia. Patient contracts for safety in that he verbalizes that he will alert staff if should develop any thoughts of self harm before acting on them. Nurse administering medications as ordered, providing emotional support and therapeutic communication, and ensuring q15 minute checks for safety continuous with documentation. Patient remains safe on Unit.

## 2015-12-24 NOTE — Progress Notes (Signed)
[facility] MD Progress Note  12/24/2015 12:50 PM Mamoun Spearman  MRN:  [id number]   Subjective: Patient states "I am fine .'     Objective:Patient is a 24 y old CM , who has a hx of schizophrenia , who presented to [place]- for hearing AH as well as asking to go to [facility].  Patient seen and chart reviewed today .Discussed patient with treatment team. [name] is awake, alert and oriented X3 .  Pt today seen in bed - appears less depressed than yesterday. Pt continues to have periods when he is delusional , however has been progressing . Pt per staff has had no disruptive issues on the unit , tolerating medications well.       Principal Problem: Paranoid schizophrenia ([place]) Diagnosis:   Patient Active Problem List   Diagnosis Date Noted  . 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 History of idiopathic thrombocytopenic purpura [Z86.2] 12/05/2015  . Paranoid schizophrenia ([place]) [F20.0] 12/03/2015  . HTN (hypertension) [I10] 03/04/2012  . GERD (gastroesophageal reflux disease) [K21.9] 03/04/2012  . Insomnia [G47.00] 03/04/2012  . CKD (chronic kidney disease) stage 2, GFR 60-89 ml/min [N18.2] 03/04/2012  . Schizophrenia, paranoid, chronic ([place]) [F20.0] 02/18/2012   Total Time spent with patient: 20 minutes  Past Psychiatric History: Pt was admitted to [facility] - schizophrenia - in 2013, was discharged on Haldol decanoate IM - and was referred to [facility].Pt also had recent hospitalization at [facility] - April 2016. Pt has an ACTT.  Past Medical History:  Past Medical History  Diagnosis Date  . Schizophrenia ([place])   . Pneumonia   . Anxiety   . GERD (gastroesophageal reflux disease)   . COPD (chronic obstructive pulmonary disease) [facility])     Past Surgical History  Procedure Laterality Date  . Tonsillectomy     Family History:  Family History  Problem Relation Age of Onset  . Arthritis Mother   . Hypertension Mother    Family Psychiatric  History: Pt did not express any hx of mental  illness in family. Social History: Pt lives by self in [place], [place]. Pt is single. History  Alcohol Use No     History  Drug Use No    Comment: denies    Social History   Social History  . Marital Status: Legally Separated    Spouse Name: N/A  . Number of Children: N/A  . Years of Education: N/A   Social History Main Topics  . Smoking status: Current Every Day Smoker -- 1.00 packs/day for 40 years    Types: Cigarettes    Start date: 04/14/1974  . Smokeless tobacco: Never Used  . Alcohol Use: No  . Drug Use: No     Comment: denies  . Sexual Activity: No   Other Topics Concern  . None   Social History Narrative   Additional Social History:   Sleep: Fair  Appetite:  Fair  Current Medications: Current Facility-Administered Medications  Medication Dose Route Frequency Provider Last Rate Last Dose  . acetaminophen (TYLENOL) tablet 650 mg  650 mg Oral Q6H PRN Derrill Center, NP   650 mg at 12/24/15 1045  . albuterol (PROVENTIL HFA;VENTOLIN HFA) 108 (90 BASE) MCG/ACT inhaler 2 puff  2 puff Inhalation Q6H PRN Derrill Center, NP   2 puff at 12/23/15 2109  . alum & mag hydroxide-simeth (MAALOX/MYLANTA) 200-200-20 MG/5ML suspension 30 mL  30 mL Oral Q4H PRN Derrill Center, NP   30 mL at 12/22/15 1723  . benztropine (COGENTIN)  tablet 0.5 mg  0.5 mg Oral QHS Derrill Center, NP   0.5 mg at 12/23/15 2109  . feeding supplement (ENSURE ENLIVE) (ENSURE ENLIVE) liquid 237 mL  237 mL Oral BID BM Tyaire Odem, MD   237 mL at 12/24/15 0904  . gabapentin (NEURONTIN) capsule 300 mg  300 mg Oral BID Derrill Center, NP   300 mg at 12/24/15 [id number]  . haloperidol (HALDOL) tablet 12 mg  12 mg Oral QHS [name], MD   12 mg at 12/23/15 2109  . hydrOXYzine (ATARAX/VISTARIL) tablet 25 mg  25 mg Oral QHS,MR X 1 Derrill Center, NP   25 mg at 12/23/15 2339  . lidocaine (LIDODERM) 5 % 1 patch  1 patch Transdermal Q24H [name], MD   1 patch at 12/24/15 1046  . LORazepam (ATIVAN) tablet 1 mg   1 mg Oral BID PRN [name], MD   1 mg at 12/23/15 2109  . losartan (COZAAR) tablet 50 mg  50 mg Oral Daily Derrill Center, NP   50 mg at 12/24/15 [id number]  . magnesium hydroxide (MILK OF MAGNESIA) suspension 30 mL  30 mL Oral Daily PRN Derrill Center, NP   30 mL at 12/05/15 0239  . menthol-cetylpyridinium (CEPACOL) lozenge 3 mg  1 lozenge Oral PRN [name], MD   3 mg at 12/19/15 1117  . mirtazapine (REMERON) tablet 15 mg  15 mg Oral QHS PRN Derrill Center, NP   15 mg at 12/22/15 2115  . nicotine (NICODERM CQ - dosed in mg/24 hours) patch 21 mg  21 mg Transdermal Daily [name], MD   21 mg at 12/24/15 0825  . OLANZapine (ZYPREXA) tablet 5 mg  5 mg Oral TID PRN [name], MD   5 mg at 12/23/15 2339   Or  . OLANZapine (ZYPREXA) injection 5 mg  5 mg Intramuscular TID PRN [name], MD      . Derrill Memo ON 01/10/2016] paliperidone (INVEGA SUSTENNA) injection 156 mg  156 mg Intramuscular Q28 days [name], MD      . pantoprazole (PROTONIX) EC tablet 40 mg  40 mg Oral Daily Derrill Center, NP   40 mg at 12/24/15 [id number]    Lab Results:  No results found for this or any previous visit (from the past 44 hour(s)).  Physical Findings: AIMS: Facial and Oral Movements Muscles of Facial Expression: None, normal Lips and Perioral Area: None, normal Jaw: None, normal Tongue: None, normal,Extremity Movements Upper (arms, wrists, hands, fingers): None, normal Lower (legs, knees, ankles, toes): None, normal, Trunk Movements Neck, shoulders, hips: None, normal, Overall Severity Severity of abnormal movements (highest score from questions above): None, normal Incapacitation due to abnormal movements: None, normal Patient's awareness of abnormal movements (rate only patient's report): No Awareness, Dental Status Current problems with teeth and/or dentures?: No Does patient usually wear dentures?: No  CIWA:  CIWA-Ar Total: 0 COWS:  COWS Total Score: 3  Musculoskeletal: Strength &  Muscle Tone: within normal limits Gait & Station: normal Patient leans: N/A  Psychiatric Specialty Exam: Review of Systems  Constitutional: Negative.   HENT: Negative.   Eyes: Negative.   Respiratory: Negative.   Cardiovascular: Negative.   Genitourinary: Negative.   Musculoskeletal: Negative.   Skin: Negative.   Neurological: Negative.   Endo/Heme/Allergies: Negative.   Psychiatric/Behavioral: Positive for hallucinations (delusions). Negative for suicidal ideas. The patient is nervous/anxious.        Paranoia and delusions  All other systems reviewed  and are negative.   Blood pressure 155/87, pulse 102, temperature 98.2 F (36.8 C), temperature source Oral, resp. rate 16, height 5\' 10"  (1.778 m), weight 86.183 kg (190 lb).Body mass index is 27.26 kg/(m^2).  General Appearance: Casual and Fairly Groomed   Eye Contact::  Good   Speech:  Normal Rate  Volume:  Normal   Mood:  Anxious   improving  Affect:  Appropriate with some improvement  Thought Process:  Irrelevant with some improvement  Orientation:  Full (Time, Place, and Person)  Thought Content:  Delusions and Rumination talks about irrelevant stuff- but improved since admission  Suicidal Thoughts:  No  Homicidal Thoughts:  No  Memory:  Immediate;   Fair Recent;   Fair Remote;   Poor  Judgement:  Impaired  Insight:  Shallow  Psychomotor Activity:  Normal  Concentration:  Fair  Recall:  [place]: Fair  Akathisia:  No  Handed:  Right  AIMS (if indicated):     Assets:  Desire for Improvement Social Support Others:  access to health care  ADL's:  Intact  Cognition: WNL   Sleep:  Number of Hours: 5.75      Treatment Plan: Patient continues to improve, although has delusions and tangential thought process- will continue treatment.Patient to have supervised phone calls . Daily contact with patient to assess and evaluate symptoms and progress in treatment and Medication management    Pt received Invega sustenna IM-  234 mg x 1 dose given 12/08/15,  Invega sustenna 156 mg IM  -  12/12/15. AIMS - 0 ( 12/21/15) Pt is currently restarted on Haldol , dose increased to 12 mg po qhs  for augmenting the effect of Invega. Pt on admission was on two antipsychotics- although he was not compliant.  Continue Cogentin 0.5 mg po qhs for EPS. Continue Gabapentin  300 mg po bid for anxiety sx. Continue Remeron 15 mg po qhs for affective sx as well as sleep.Continue Vistaril 25 mg PRN with x1 Repeat or insomnia. Will restart Ativan 1 mg po bid prn - for anxiety sx. Will continue to monitor vitals ,medication compliance and treatment side effects while patient is here.  Will monitor for medical issues as well as call consult as needed.   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 Pt also has a hx of CKD- Creatinine chronically elevated. TSH - wnl ,lipid panel- wnl ,  hba1c- wnl , PL is elevated - likely 2/2 neuroleptics- will monitor.  CSW will start working on disposition. Pt to be referred to ALF for placement - Mother is the legal guardian.ALF to assess him today. Continue Recreational therapy consult . Patient to participate in therapeutic milieu   Rubens Cranston MD 12/24/2015, 12:50 PM

## 2015-12-24 NOTE — Progress Notes (Signed)
Adult Psychoeducational Group Note  Date:  12/24/2015 Time:  8:15 PM  Group Topic/Focus:  Wrap-Up Group:   The focus of this group is to help patients review their daily goal of treatment and discuss progress on daily workbooks.  Participation Level:  Did Not Attend  Pt did not attend wrap-up group this evening.    Lincoln Brigham 12/24/2015, 8:42 PM

## 2015-12-24 NOTE — BHH Group Notes (Signed)
[facility] Group Notes:  (Clinical Social Work)  12/24/2015  11:00AM-12:00PM  Summary of Progress/Problems:   Today's process group involved patients discussing their feelings related to being hospitalized during the holiday period.  Some patients were perturbed, while others said they were fine with being in the hospital, had no other options, or need the treatment.  Some patients were being discharged later today.  After a short discussion on looking for the good in being in the hospital at this time, we listened to music for the remainder of group, with patients requesting songs they particularly wanted to hear, whether holiday-themed or not.  The patient expressed his primary feeling about being hospitalized is not very good, because "they" are trying to get him to go to a group home, when he could just walk home from there.  He quickly stopped focusing on that, however, and calmly started focusing on the music, requested multiple songs and was very appropriate in interactions after that.  Type of Therapy:  Group Therapy - Process  Participation Level:  Active  Participation Quality:  Attentive  Affect:  Blunted  Cognitive:  Appropriate  Insight:  Improving  Engagement in Therapy:  Engaged  Modes of Intervention:  Exploration, Discussion  Selmer Dominion, LCSW 12/24/2015, 12:20 PM

## 2015-12-25 NOTE — Progress Notes (Signed)
[facility] MD Progress Note  12/25/2015 12:25 PM David [name]  MRN:  [id number]   Subjective: Patient states "I am OK."     Objective:Patient is a 89 y old CM , who has a hx of schizophrenia , who presented to [place]- for hearing AH as well as asking to go to [facility].  Patient seen and chart reviewed today .Discussed patient with treatment team. David [name] is awake, alert and oriented X3 .  Pt today seen in bed -denies new concerns. Pt per staff has had no disruptive issues on the unit , tolerating medications well.       Principal Problem: Paranoid schizophrenia ([place]) Diagnosis:   Patient Active Problem List   Diagnosis Date Noted  . 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 History of idiopathic thrombocytopenic purpura [Z86.2] 12/05/2015  . Paranoid schizophrenia ([place]) [F20.0] 12/03/2015  . HTN (hypertension) [I10] 03/04/2012  . GERD (gastroesophageal reflux disease) [K21.9] 03/04/2012  . Insomnia [G47.00] 03/04/2012  . CKD (chronic kidney disease) stage 2, GFR 60-89 ml/min [N18.2] 03/04/2012  . Schizophrenia, paranoid, chronic ([place]) [F20.0] 02/18/2012   Total Time spent with patient: 20 minutes  Past Psychiatric History: Pt was admitted to [facility] - schizophrenia - in 2013, was discharged on Haldol decanoate IM - and was referred to [facility].Pt also had recent hospitalization at [facility] - April 2016. Pt has an ACTT.  Past Medical History:  Past Medical History  Diagnosis Date  . Schizophrenia ([place])   . Pneumonia   . Anxiety   . GERD (gastroesophageal reflux disease)   . COPD (chronic obstructive pulmonary disease) [facility])     Past Surgical History  Procedure Laterality Date  . Tonsillectomy     Family History:  Family History  Problem Relation Age of Onset  . Arthritis Mother   . Hypertension Mother    Family Psychiatric  History: Pt did not express any hx of mental illness in family. Social History: Pt lives by self in [place], [place]. Pt is single. History  Alcohol  Use No     History  Drug Use No    Comment: denies    Social History   Social History  . Marital Status: Legally Separated    Spouse Name: N/A  . Number of Children: N/A  . Years of Education: N/A   Social History Main Topics  . Smoking status: Current Every Day Smoker -- 1.00 packs/day for 40 years    Types: Cigarettes    Start date: 04/14/1974  . Smokeless tobacco: Never Used  . Alcohol Use: No  . Drug Use: No     Comment: denies  . Sexual Activity: No   Other Topics Concern  . None   Social History Narrative   Additional Social History:   Sleep: Fair  Appetite:  Fair  Current Medications: Current Facility-Administered Medications  Medication Dose Route Frequency Provider Last Rate Last Dose  . acetaminophen (TYLENOL) tablet 650 mg  650 mg Oral Q6H PRN Derrill Center, NP   650 mg at 12/24/15 2351  . albuterol (PROVENTIL HFA;VENTOLIN HFA) 108 (90 BASE) MCG/ACT inhaler 2 puff  2 puff Inhalation Q6H PRN Derrill Center, NP   2 puff at 12/23/15 2109  . alum & mag hydroxide-simeth (MAALOX/MYLANTA) 200-200-20 MG/5ML suspension 30 mL  30 mL Oral Q4H PRN Derrill Center, NP   30 mL at 12/22/15 1723  . benztropine (COGENTIN) tablet 0.5 mg  0.5 mg Oral QHS Derrill Center, NP   0.5 mg at 12/24/15 2133  .  feeding supplement (ENSURE ENLIVE) (ENSURE ENLIVE) liquid 237 mL  237 mL Oral BID BM Mattis Featherly, MD   237 mL at 12/24/15 1348  . gabapentin (NEURONTIN) capsule 300 mg  300 mg Oral BID Derrill Center, NP   300 mg at 12/25/15 0801  . haloperidol (HALDOL) tablet 12 mg  12 mg Oral QHS [name], MD   12 mg at 12/24/15 2133  . hydrOXYzine (ATARAX/VISTARIL) tablet 25 mg  25 mg Oral QHS,MR X 1 Derrill Center, NP   25 mg at 12/24/15 2133  . lidocaine (LIDODERM) 5 % 1 patch  1 patch Transdermal Q24H [name], MD   1 patch at 12/24/15 1046  . LORazepam (ATIVAN) tablet 1 mg  1 mg Oral BID PRN [name], MD   1 mg at 12/24/15 2251  . losartan (COZAAR) tablet 50 mg  50 mg  Oral Daily Derrill Center, NP   50 mg at 12/25/15 0801  . magnesium hydroxide (MILK OF MAGNESIA) suspension 30 mL  30 mL Oral Daily PRN Derrill Center, NP   30 mL at 12/05/15 0239  . menthol-cetylpyridinium (CEPACOL) lozenge 3 mg  1 lozenge Oral PRN [name], MD   3 mg at 12/19/15 1117  . mirtazapine (REMERON) tablet 15 mg  15 mg Oral QHS PRN Derrill Center, NP   15 mg at 12/24/15 2133  . nicotine (NICODERM CQ - dosed in mg/24 hours) patch 21 mg  21 mg Transdermal Daily [name], MD   21 mg at 12/25/15 0802  . OLANZapine (ZYPREXA) tablet 5 mg  5 mg Oral TID PRN [name], MD   5 mg at 12/23/15 2339   Or  . OLANZapine (ZYPREXA) injection 5 mg  5 mg Intramuscular TID PRN [name], MD      . Derrill Memo ON 01/10/2016] paliperidone (INVEGA SUSTENNA) injection 156 mg  156 mg Intramuscular Q28 days [name], MD      . pantoprazole (PROTONIX) EC tablet 40 mg  40 mg Oral Daily Derrill Center, NP   40 mg at 12/25/15 [id number]    Lab Results:  No results found for this or any previous visit (from the past 43 hour(s)).  Physical Findings: AIMS: Facial and Oral Movements Muscles of Facial Expression: None, normal Lips and Perioral Area: None, normal Jaw: None, normal Tongue: None, normal,Extremity Movements Upper (arms, wrists, hands, fingers): None, normal Lower (legs, knees, ankles, toes): None, normal, Trunk Movements Neck, shoulders, hips: None, normal, Overall Severity Severity of abnormal movements (highest score from questions above): None, normal Incapacitation due to abnormal movements: None, normal Patient's awareness of abnormal movements (rate only patient's report): No Awareness, Dental Status Current problems with teeth and/or dentures?: No Does patient usually wear dentures?: No  CIWA:  CIWA-Ar Total: 0 COWS:  COWS Total Score: 3  Musculoskeletal: Strength & Muscle Tone: within normal limits Gait & Station: normal Patient leans: N/A  Psychiatric Specialty  Exam: Review of Systems  Constitutional: Negative.   HENT: Negative.   Eyes: Negative.   Respiratory: Negative.   Cardiovascular: Negative.   Genitourinary: Negative.   Musculoskeletal: Negative.   Skin: Negative.   Neurological: Negative.   Endo/Heme/Allergies: Negative.   Psychiatric/Behavioral: Positive for hallucinations (delusions). Negative for suicidal ideas. The patient is nervous/anxious.        Paranoia and delusions  All other systems reviewed and are negative.   Blood pressure 140/84, pulse 98, temperature 98 F (36.7 C), temperature source Oral, resp. rate 20,  height 5\' 10"  (1.778 m), weight 86.183 kg (190 lb).Body mass index is 27.26 kg/(m^2).  General Appearance: Casual and Fairly Groomed   Eye Contact::  Good   Speech:  Normal Rate  Volume:  Normal   Mood:  Anxious   improving  Affect:  Appropriate with some improvement  Thought Process:  Linear  Orientation:  Full (Time, Place, and Person)  Thought Content:  Delusions and Rumination talks about irrelevant stuff- but improved since admission  Suicidal Thoughts:  No  Homicidal Thoughts:  No  Memory:  Immediate;   Fair Recent;   Fair Remote;   Poor  Judgement:  Impaired  Insight:  Shallow  Psychomotor Activity:  Normal  Concentration:  Fair  Recall:  [place]: Fair  Akathisia:  No  Handed:  Right  AIMS (if indicated):     Assets:  Desire for Improvement Social Support Others:  access to health care  ADL's:  Intact  Cognition: WNL   Sleep:  Number of Hours: 5.75      Treatment Plan: Patient continues to improve, although has delusions on and off which may be chronic - will continue treatment.Patient to have supervised phone calls . Daily contact with patient to assess and evaluate symptoms and progress in treatment and Medication management   Pt received Invega sustenna IM-  234 mg x 1 dose given 12/08/15,  Invega sustenna 156 mg IM  -  12/12/15. AIMS - 0 ( 12/21/15) Pt  is currently restarted on Haldol , dose increased to 12 mg po qhs  for augmenting the effect of Invega. Pt on admission was on two antipsychotics- although he was not compliant.  Continue Cogentin 0.5 mg po qhs for EPS. Continue Gabapentin  300 mg po bid for anxiety sx. Continue Remeron 15 mg po qhs for affective sx as well as sleep.Continue Vistaril 25 mg PRN with x1 Repeat or insomnia. Will restart Ativan 1 mg po bid prn - for anxiety sx. Will continue to monitor vitals ,medication compliance and treatment side effects while patient is here.  Will monitor for medical issues as well as call consult as needed.   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 Pt also has a hx of CKD- Creatinine chronically elevated. TSH - wnl ,lipid panel- wnl ,  hba1c- wnl , PL is elevated - likely 2/2 neuroleptics- will monitor.  CSW will start working on disposition. Pt to be referred to ALF for placement - Mother is the legal guardian. Continue Recreational therapy consult . Patient to participate in therapeutic milieu   Maripaz Mullan MD 12/25/2015, 12:25 PM

## 2015-12-25 NOTE — Progress Notes (Signed)
D: Pt is alert and oriented x4. Pt complained of mild depression and anxiety. He states, "I am just ready to go home; I tell you, I feel much better but still a little depressed and anxious at this time.am getting there." Pt is very flat, isolated and withdrawn to his room. Pt at this time however, denies SI/HI and AVH. Pt was calm and cooperative through the shift assessment. A: Medications offered as prescribed.  Support, encouragement, and safe environment provided.  15-minute safety checks continue. R: Pt was med compliant, no acute distress.  Pt did not attend group. Safety checks continue. Safety checks continue

## 2015-12-25 NOTE — Progress Notes (Signed)
Patient ID: David [name], male   DOB: 02/26/1956, 59 y.o.   MRN: [id number] D: Patient states he is not depressed and denies SI. Patient has a depressed mood and affect. Talked at length about having meds changed by [facility] and falling apart. Discussed dc plans .Patient complained of pain in upper back. Staying in room in bed. Is desheveled.  A: Patient given emotional support from RN. Patient given medications per MD orders. PRN  Given for back pain. Patient encouraged to spend time out of his room.. Patient encouraged to come to staff with any questions or concerns.  R: Patient remains cooperative and appropriate. Patient reports less pain. Will continue to monitor patient for safety.

## 2015-12-26 NOTE — Progress Notes (Signed)
David [name] was noted x 2 calling 911 to have them pick him up so he can leave.  He was redirected but he continues to call.  Staff will monitor the phone calls and he will not be able to call without staff assistance at this time.  He did get upset when he was told about the phone calls but was able to calm down easily and is sitting calmly in the day room.

## 2015-12-26 NOTE — Progress Notes (Signed)
Pt on unit talking on phone loudly.  Once off phone, approaches nurses station and demands meds, "right now".  Rude, vocal, uncooperative and angry.  Pt advised to calm down and go to room or watch TV.  Back on phone and talking loudly.  Nurses advises pt the phone time is ending.  Pt. Yells "hey Im on an important call".  Pt advised to hang up phone. Pt refuses to have Lidoderm removed at scheduled 01:06.  Stats "get this damn thing off now, I dont want anyone waking me up".  Continue to monitor for safety.

## 2015-12-26 NOTE — Plan of Care (Signed)
Problem: Aggression Towards others,Towards Self, and or Destruction Goal: LTG - No aggression,physical/verbal/destruction prior to D/C (Patient will have no episodes of physical or verbal aggression or property destruction towards self or others for _____ day (s) prior to discharge.)  Outcome: Not Progressing Gionnie has been very agitated and became upset slamming pencil down on nurses station when he was called "David [name]" instead of "[name]."  We will continue to monitor the progress towards his goals.

## 2015-12-26 NOTE — Progress Notes (Signed)
[facility] MD Progress Note  12/26/2015  David [name]  MRN:  [id number]   Subjective: Patient states "I'm doing much better than I was before. I was doing bad when I came in. I would like to go soon but I'm doing well."  Objective:Patient is a 29 y old CM , who has a hx of schizophrenia , who presented to [place]- for hearing AH as well as asking to go to [facility].  Pt seen and chart reviewed. Pt is alert/oriented x4, calm, cooperative, and appropriate to situation. Pt denies suicidal/homicidal ideation and psychosis and still appears to be mildly responding to internal stimuli, yet better than when he came in. It was not necessary to redirect him. He cites good sleep and good appetite.     Principal Problem: Paranoid schizophrenia ([place]) Diagnosis:   Patient Active Problem List   Diagnosis Date Noted  . 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 History of idiopathic thrombocytopenic purpura [Z86.2] 12/05/2015  . Paranoid schizophrenia ([place]) [F20.0] 12/03/2015  . HTN (hypertension) [I10] 03/04/2012  . GERD (gastroesophageal reflux disease) [K21.9] 03/04/2012  . Insomnia [G47.00] 03/04/2012  . CKD (chronic kidney disease) stage 2, GFR 60-89 ml/min [N18.2] 03/04/2012  . Schizophrenia, paranoid, chronic ([place]) [F20.0] 02/18/2012   Total Time spent with patient: 15 minutes  Past Psychiatric History: Pt was admitted to [facility] - schizophrenia - in 2013, was discharged on Haldol decanoate IM - and was referred to [facility].Pt also had recent hospitalization at [facility] - April 2016. Pt has an ACTT.  Past Medical History:  Past Medical History  Diagnosis Date  . Schizophrenia ([place])   . Pneumonia   . Anxiety   . GERD (gastroesophageal reflux disease)   . COPD (chronic obstructive pulmonary disease) [facility])     Past Surgical History  Procedure Laterality Date  . Tonsillectomy     Family History:  Family History  Problem Relation Age of Onset  . Arthritis Mother   . Hypertension Mother    Family  Psychiatric  History: Pt did not express any hx of mental illness in family. Social History: Pt lives by self in [place], [place]. Pt is single. History  Alcohol Use No     History  Drug Use No    Comment: denies    Social History   Social History  . Marital Status: Legally Separated    Spouse Name: N/A  . Number of Children: N/A  . Years of Education: N/A   Social History Main Topics  . Smoking status: Current Every Day Smoker -- 1.00 packs/day for 40 years    Types: Cigarettes    Start date: 04/14/1974  . Smokeless tobacco: Never Used  . Alcohol Use: No  . Drug Use: No     Comment: denies  . Sexual Activity: No   Other Topics Concern  . None   Social History Narrative   Additional Social History:   Sleep: Good  Appetite:  Good  Current Medications: Current Facility-Administered Medications  Medication Dose Route Frequency Provider Last Rate Last Dose  . acetaminophen (TYLENOL) tablet 650 mg  650 mg Oral Q6H PRN Derrill Center, NP   650 mg at 12/24/15 2351  . albuterol (PROVENTIL HFA;VENTOLIN HFA) 108 (90 BASE) MCG/ACT inhaler 2 puff  2 puff Inhalation Q6H PRN Derrill Center, NP   2 puff at 12/23/15 2109  . alum & mag hydroxide-simeth (MAALOX/MYLANTA) 200-200-20 MG/5ML suspension 30 mL  30 mL Oral Q4H PRN Derrill Center, NP   30 mL at  12/22/15 1723  . benztropine (COGENTIN) tablet 0.5 mg  0.5 mg Oral QHS Derrill Center, NP   0.5 mg at 12/25/15 2312  . feeding supplement (ENSURE ENLIVE) (ENSURE ENLIVE) liquid 237 mL  237 mL Oral BID BM Saramma Eappen, MD   237 mL at 12/26/15 0947  . gabapentin (NEURONTIN) capsule 300 mg  300 mg Oral BID Derrill Center, NP   300 mg at 12/26/15 0805  . haloperidol (HALDOL) tablet 12 mg  12 mg Oral QHS [name], MD   12 mg at 12/25/15 2127  . hydrOXYzine (ATARAX/VISTARIL) tablet 25 mg  25 mg Oral QHS,MR X 1 Derrill Center, NP   25 mg at 12/25/15 2312  . lidocaine (LIDODERM) 5 % 1 patch  1 patch Transdermal Q24H [name], MD   1  patch at 12/26/15 1116  . LORazepam (ATIVAN) tablet 1 mg  1 mg Oral BID PRN [name], MD   1 mg at 12/25/15 2120  . losartan (COZAAR) tablet 50 mg  50 mg Oral Daily Derrill Center, NP   50 mg at 12/26/15 0805  . magnesium hydroxide (MILK OF MAGNESIA) suspension 30 mL  30 mL Oral Daily PRN Derrill Center, NP   30 mL at 12/05/15 0239  . menthol-cetylpyridinium (CEPACOL) lozenge 3 mg  1 lozenge Oral PRN [name], MD   3 mg at 12/19/15 1117  . mirtazapine (REMERON) tablet 15 mg  15 mg Oral QHS PRN Derrill Center, NP   15 mg at 12/25/15 2356  . nicotine (NICODERM CQ - dosed in mg/24 hours) patch 21 mg  21 mg Transdermal Daily [name], MD   21 mg at 12/26/15 0805  . OLANZapine (ZYPREXA) tablet 5 mg  5 mg Oral TID PRN [name], MD   5 mg at 12/26/15 0117   Or  . OLANZapine (ZYPREXA) injection 5 mg  5 mg Intramuscular TID PRN [name], MD      . Derrill Memo ON 01/10/2016] paliperidone (INVEGA SUSTENNA) injection 156 mg  156 mg Intramuscular Q28 days [name], MD      . pantoprazole (PROTONIX) EC tablet 40 mg  40 mg Oral Daily Derrill Center, NP   40 mg at 12/26/15 [id number]    Lab Results:  No results found for this or any previous visit (from the past 8 hour(s)).  Physical Findings: AIMS: Facial and Oral Movements Muscles of Facial Expression: None, normal Lips and Perioral Area: None, normal Jaw: None, normal Tongue: None, normal,Extremity Movements Upper (arms, wrists, hands, fingers): None, normal Lower (legs, knees, ankles, toes): None, normal, Trunk Movements Neck, shoulders, hips: None, normal, Overall Severity Severity of abnormal movements (highest score from questions above): None, normal Incapacitation due to abnormal movements: None, normal Patient's awareness of abnormal movements (rate only patient's report): No Awareness, Dental Status Current problems with teeth and/or dentures?: No Does patient usually wear dentures?: No  CIWA:  CIWA-Ar Total:  0 COWS:  COWS Total Score: 3  Musculoskeletal: Strength & Muscle Tone: within normal limits  Gait & Station: normal  Patient leans: N/A   Psychiatric Specialty Exam: Review of Systems  Constitutional: Negative.   HENT: Negative.   Eyes: Negative.   Respiratory: Negative.   Cardiovascular: Negative.   Genitourinary: Negative.   Musculoskeletal: Negative.   Skin: Negative.   Neurological: Negative.   Endo/Heme/Allergies: Negative.   Psychiatric/Behavioral: Positive for hallucinations (delusions). Negative for suicidal ideas. The patient is nervous/anxious.  Paranoia and delusions  All other systems reviewed and are negative.   Blood pressure 150/80, pulse 106, temperature 98.2 F (36.8 C), temperature source Oral, resp. rate 18, height 5\' 10"  (1.778 m), weight 86.183 kg (190 lb).Body mass index is 27.26 kg/(m^2).  General Appearance: Casual and Fairly Groomed   Eye Contact::  Good   Speech:  Normal Rate  Volume:  Normal   Mood:  Anxious   Improving   Affect:  Appropriate with some improvement   Thought Process:  Linear  Orientation:  Full (Time, Place, and Person)  Thought Content:  Delusions and Rumination talks about irrelevant stuff- but improved since admission  Suicidal Thoughts:  No  Homicidal Thoughts:  No  Memory:  Immediate;   Fair Recent;   Fair Remote;   Poor  Judgement:  Impaired  Insight:  Shallow  Psychomotor Activity:  Normal  Concentration:  Fair  Recall:  [place]: Fair  Akathisia:  No  Handed:  Right  AIMS (if indicated):     Assets:  Desire for Improvement Social Support Others:  access to health care  ADL's:  Intact  Cognition: WNL   Sleep:  Number of Hours: 5.25      Treatment Plan: Patient continues to improve, although has delusions on and off which may be chronic - will continue treatment.Patient to have supervised phone calls. Today, on 12/26/2015, pt continues to be chronic in terms of psychosis yet  was able to participate well in the assessment.    Daily contact with patient to assess and evaluate symptoms and progress in treatment and Medication management   Pt received Invega sustenna IM-  234 mg x 1 dose given 12/08/15,  Invega sustenna 156 mg IM  -  12/12/15. AIMS - 0 ( 12/21/15) Pt is currently restarted on Haldol , dose increased to 12 mg po qhs  for augmenting the effect of Invega. Pt on admission was on two antipsychotics- although he was not compliant.  Continue Cogentin 0.5 mg po qhs for EPS. Continue Gabapentin  300 mg po bid for anxiety sx. Continue Remeron 15 mg po qhs for affective sx as well as sleep.Continue Vistaril 25 mg PRN with x1 Repeat or insomnia. Will restart Ativan 1 mg po bid prn - for anxiety sx. Will continue to monitor vitals ,medication compliance and treatment side effects while patient is here.  Will monitor for medical issues as well as call consult as needed.   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 Pt also has a hx of CKD- Creatinine chronically elevated. TSH - wnl ,lipid panel- wnl ,  hba1c- wnl , PL is elevated - likely 2/2 neuroleptics- will monitor.  CSW will start working on disposition. Pt to be referred to ALF for placement - Mother is the legal guardian. Continue Recreational therapy consult.  Patient to participate in therapeutic milieu   Benjamine Mola, FNP-BC 12/26/2015, 10:45 AM Agree with NP progress note as above Neita Garnet, MD

## 2015-12-26 NOTE — BHH Group Notes (Signed)
[facility] LCSW Aftercare Discharge Planning Group Note   12/26/2015 1:33 PM  Participation Quality:  Minimal   Mood/Affect:  Irritable  Depression Rating:  8  Anxiety Rating:  8  Thoughts of Suicide:  No Will you contract for safety?   NA  Current AVH:  No  Plan for Discharge/Comments:  Pt stated that he does not want to go to group home; would not talk more on subject. Pt irritable this morning and resistant to engagement in group setting. Pt upset about being called [place] rather than [place]. Minimal insight at this time.   Transportation Means: unknown at this time.   Supports: none identified by pt.   Smart, Finleigh Cheong LCSW

## 2015-12-26 NOTE — Progress Notes (Signed)
DAR Note: David [name] has been up in the day room much of the day drinking coffee.  His BP was elevated this AM and manuel was completed, 180/80.  BP medication given and BP checked later at 150/80.  Notified Nurse Practitioner of the BP elevation.  [name] denies any SI/HI or A/V hallucinations.  He is very agitated when talked to and was very upset that Probation officer called him by [employer] instead of [employer]" as he likes to be called.  He slammed the pencil down on the nurses station and started yelling at staff.  He was able to be redirected.  He completed his self inventory and reports that his depression is 8/10, hopelessness 6/10 and anxiety 9/10.  He didn't list a goal for today and didn't want to talk to writer about a goal.  He did take his medications without difficulty.  He denies any pain or discomfort and appears to be in no physical distress.  Encouraged participation in group and unit activities.  Q 15 minute checks maintained for safety.  We will continue to monitor the progress towards his goals.

## 2015-12-26 NOTE — Progress Notes (Signed)
Recreation Therapy Notes  12.26.2016 @ 12:45pm - LRT met with patient in patient room, patient expressed discontent with not being d/c home. Patient stated he did not want to go to group home, as previously arranged. Patient specifically expressed he does not want to go to the group home because he can not have women over at the group home. Patient additionally shared that he would like to be able to visit with a friend of his from the [employer]. LRT attention was to assess patient leisure interest to investigate additional activities to present for patient to participate in. Patient expressed he likes tennis and drumming, but quickly stated he would not participate in either in the hospital.   Patient affect flat, mood depressed. Patient became agitated when talking about going to the group home. Patient additionally cursed while talking to LRT about group home, which has not previously been a part of interaction with LRT.   Laureen Ochs Veronica Fretz, LRT/CTRS   Lequita Meadowcroft L 12/26/2015 1:01 PM

## 2015-12-27 NOTE — Progress Notes (Signed)
Patient ID: David [name], male   DOB: 06-29-1956, 59 y.o.   MRN: [id number]  [facility] MD Progress Note  12/27/2015  Rayshaun Alfieri  MRN:  [id number]   Subjective: Patient states "My mother does not want me to have a life. I was doing fine living on my own. I mean I would get up eat something and drink my coffee. My mother just thinks she knows what is best for me and always has. I want to go back home and take care of myself. I really do not see the point of going to a group home when I have my own home."   Objective:Patient is a 73 y old CM , who has a hx of schizophrenia , who presented to [place]- for hearing AH as well as aggressive behaviors. Pt seen and chart reviewed. Pt is alert/oriented x4, calm, cooperative, and appropriate to situation. Pt denies suicidal/homicidal ideation and psychosis by making occasional delusional comments, yet better than when he came in. It was not necessary to redirect him. He cites good sleep and good appetite. Patient expresses a great deal of frustration today over being referred to a group home. During assessment the patient greatly minimized his symptoms even denying that he has a mental condition such as schizophrenia "That's what they said at [facility] but I think I'm good. Just need to get my sleep worked out." Patient has poor insight into symptoms as evidenced by assessment notes from [place] where patient was verbally aggressive and disorganized. Lawrenc was last inpatient at [facility] in 2013 for stabilization of psychiatric symptoms related to schizophrenia. Patient would benefit from a more structured living environment to improve compliance with medications and prevent further relapse of symptoms. Patient continues to be express anxiety regarding his long length of stay in the hospital. He reports spending several months in [facility] earlier this year. Nursing staff report that the patient continues to be irritable at times and needs occasional redirection from making inappropriate phone  calls to 911.    Principal Problem: Paranoid schizophrenia ([place]) Diagnosis:   Patient Active Problem List   Diagnosis Date Noted  . 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 History of idiopathic thrombocytopenic purpura [Z86.2] 12/05/2015  . Paranoid schizophrenia ([place]) [F20.0] 12/03/2015  . HTN (hypertension) [I10] 03/04/2012  . GERD (gastroesophageal reflux disease) [K21.9] 03/04/2012  . Insomnia [G47.00] 03/04/2012  . CKD (chronic kidney disease) stage 2, GFR 60-89 ml/min [N18.2] 03/04/2012  . Schizophrenia, paranoid, chronic ([place]) [F20.0] 02/18/2012   Total Time spent with patient: 20 minutes  Past Psychiatric History: Pt was admitted to [facility] - schizophrenia - in 2013, was discharged on Haldol decanoate IM - and was referred to [facility]Pt also had recent hospitalization at [facility] - April 2016. Pt has an ACTT.  Past Medical History:  Past Medical History  Diagnosis Date  . Schizophrenia ([place])   . Pneumonia   . Anxiety   . GERD (gastroesophageal reflux disease)   . COPD (chronic obstructive pulmonary disease) [facility])     Past Surgical History  Procedure Laterality Date  . Tonsillectomy     Family History:  Family History  Problem Relation Age of Onset  . Arthritis Mother   . Hypertension Mother    Family Psychiatric  History: Pt did not express any hx of mental illness in family. Social History: Pt lives by self in [place], [place]. Pt is single. History  Alcohol Use No     History  Drug Use No    Comment: denies  Social History   Social History  . Marital Status: Legally Separated    Spouse Name: N/A  . Number of Children: N/A  . Years of Education: N/A   Social History Main Topics  . Smoking status: Current Every Day Smoker -- 1.00 packs/day for 40 years    Types: Cigarettes    Start date: 04/14/1974  . Smokeless tobacco: Never Used  . Alcohol Use: No  . Drug Use: No     Comment: denies  . Sexual Activity: No   Other Topics Concern  . None    Social History Narrative   Additional Social History:   Sleep: Good  Appetite:  Good  Current Medications: Current Facility-Administered Medications  Medication Dose Route Frequency Provider Last Rate Last Dose  . acetaminophen (TYLENOL) tablet 650 mg  650 mg Oral Q6H PRN Derrill Center, NP   650 mg at 12/27/15 1412  . albuterol (PROVENTIL HFA;VENTOLIN HFA) 108 (90 BASE) MCG/ACT inhaler 2 puff  2 puff Inhalation Q6H PRN Derrill Center, NP   2 puff at 12/23/15 2109  . alum & mag hydroxide-simeth (MAALOX/MYLANTA) 200-200-20 MG/5ML suspension 30 mL  30 mL Oral Q4H PRN Derrill Center, NP   30 mL at 12/22/15 1723  . benztropine (COGENTIN) tablet 0.5 mg  0.5 mg Oral QHS Derrill Center, NP   0.5 mg at 12/26/15 2113  . feeding supplement (ENSURE ENLIVE) (ENSURE ENLIVE) liquid 237 mL  237 mL Oral BID BM Saramma Eappen, MD   237 mL at 12/26/15 1343  . gabapentin (NEURONTIN) capsule 300 mg  300 mg Oral BID Derrill Center, NP   300 mg at 12/27/15 1614  . haloperidol (HALDOL) tablet 12 mg  12 mg Oral QHS [name], MD   12 mg at 12/26/15 2114  . hydrOXYzine (ATARAX/VISTARIL) tablet 25 mg  25 mg Oral QHS,MR X 1 Derrill Center, NP   25 mg at 12/26/15 2236  . lidocaine (LIDODERM) 5 % 1 patch  1 patch Transdermal Q24H [name], MD   1 patch at 12/27/15 1159  . LORazepam (ATIVAN) tablet 1 mg  1 mg Oral BID PRN [name], MD   1 mg at 12/27/15 1001  . losartan (COZAAR) tablet 50 mg  50 mg Oral Daily Derrill Center, NP   50 mg at 12/27/15 1000  . magnesium hydroxide (MILK OF MAGNESIA) suspension 30 mL  30 mL Oral Daily PRN Derrill Center, NP   30 mL at 12/05/15 0239  . menthol-cetylpyridinium (CEPACOL) lozenge 3 mg  1 lozenge Oral PRN [name], MD   3 mg at 12/19/15 1117  . mirtazapine (REMERON) tablet 15 mg  15 mg Oral QHS PRN Derrill Center, NP   15 mg at 12/26/15 2236  . nicotine (NICODERM CQ - dosed in mg/24 hours) patch 21 mg  21 mg Transdermal Daily [name], MD   21 mg at  12/27/15 0959  . OLANZapine (ZYPREXA) tablet 5 mg  5 mg Oral TID PRN [name], MD   5 mg at 12/27/15 1001   Or  . OLANZapine (ZYPREXA) injection 5 mg  5 mg Intramuscular TID PRN [name], MD      . Derrill Memo ON 01/10/2016] paliperidone (INVEGA SUSTENNA) injection 156 mg  156 mg Intramuscular Q28 days [name], MD      . pantoprazole (PROTONIX) EC tablet 40 mg  40 mg Oral Daily Derrill Center, NP   40 mg at 12/27/15 1000  Lab Results:  No results found for this or any previous visit (from the past 48 hour(s)).  Physical Findings: AIMS: Facial and Oral Movements Muscles of Facial Expression: None, normal Lips and Perioral Area: None, normal Jaw: None, normal Tongue: None, normal,Extremity Movements Upper (arms, wrists, hands, fingers): None, normal Lower (legs, knees, ankles, toes): None, normal, Trunk Movements Neck, shoulders, hips: None, normal, Overall Severity Severity of abnormal movements (highest score from questions above): None, normal Incapacitation due to abnormal movements: None, normal Patient's awareness of abnormal movements (rate only patient's report): No Awareness, Dental Status Current problems with teeth and/or dentures?: No Does patient usually wear dentures?: No  CIWA:  CIWA-Ar Total: 0 COWS:  COWS Total Score: 3  Musculoskeletal: Strength & Muscle Tone: within normal limits  Gait & Station: normal  Patient leans: N/A   Psychiatric Specialty Exam: Review of Systems  Constitutional: Negative.   HENT: Negative.   Eyes: Negative.   Respiratory: Negative.   Cardiovascular: Negative.   Genitourinary: Negative.   Musculoskeletal: Negative.   Skin: Negative.   Neurological: Negative.   Endo/Heme/Allergies: Negative.   Psychiatric/Behavioral: Positive for hallucinations (delusions). Negative for depression, suicidal ideas, memory loss and substance abuse. The patient is nervous/anxious. The patient does not have insomnia.        Paranoia and  delusions  All other systems reviewed and are negative.   Blood pressure 131/66, pulse 100, temperature 97.9 F (36.6 C), temperature source Oral, resp. rate 17, height 5\' 10"  (1.778 m), weight 86.183 kg (190 lb).Body mass index is 27.26 kg/(m^2).  General Appearance: Casual and Fairly Groomed   Engineer, water::  Good   Speech:  Normal Rate  Volume:  Normal   Mood:  Anxious     Affect:  Appropriate   Thought Process:  Linear  Orientation:  Full (Time, Place, and Person)  Thought Content:  Delusions and Rumination   Suicidal Thoughts:  No  Homicidal Thoughts:  No  Memory:  Immediate;   Fair Recent;   Fair Remote;   Poor  Judgement:  Impaired  Insight:  Shallow  Psychomotor Activity:  Normal  Concentration:  Fair  Recall:  [place]: Fair  Akathisia:  No  Handed:  Right  AIMS (if indicated):     Assets:  Desire for Improvement Social Support Others:  access to health care  ADL's:  Intact  Cognition: WNL   Sleep:  Number of Hours: 6.25      Treatment Plan: Patient continues to improve, although has delusions on and off which may be chronic - will continue treatment.Patient to have supervised phone calls. Today, on 12/27/2015, pt continues to be chronic in terms of psychosis yet was able to participate well in the assessment.    Daily contact with patient to assess and evaluate symptoms and progress in treatment and Medication management   Pt received Invega sustenna IM-  234 mg x 1 dose given 12/08/15,  Invega sustenna 156 mg IM  -  12/12/15. AIMS - 0 ( 12/21/15) Pt is currently on Haldol , dose at 12 mg po qhs  for augmenting the effect of Invega. Pt on admission was on two antipsychotics- although he was not compliant.  Continue Cogentin 0.5 mg po qhs for EPS. Continue Gabapentin  300 mg po bid for anxiety sx. Continue Remeron 15 mg po qhs for affective sx as well as sleep. Continue Vistaril 25 mg PRN with x1 Repeat or insomnia. Will restart  Ativan 1 mg  po bid prn - for anxiety sx. Will continue to monitor vitals ,medication compliance and treatment side effects while patient is here.  Will monitor for medical issues as well as call consult as needed.   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 Pt also has a hx of CKD- Creatinine chronically elevated. TSH - wnl ,lipid panel- wnl ,  hba1c- wnl , PL is elevated - likely 2/2 neuroleptics- will monitor.  CSW will start working on disposition. Pt to be referred to ALF for placement - Mother is the legal guardian. Continue Recreational therapy consult.  Patient to participate in therapeutic milieu   Elmarie Shiley, NP-C 12/27/2015, 16:37 Agree with NP progress note as above Neita Garnet, MD

## 2015-12-27 NOTE — BHH Group Notes (Signed)
[place] Group Notes:  (Nursing/MHT/Case Management/Adjunct)  Date:  12/27/2015  Time:  10:20 AM  Type of Therapy:  Psychoeducational Skills  Participation Level:  Did Not Attend  Participation Quality:  nba  Affect:  na  Cognitive:  na  Insight:  None  Engagement in Group:  na  Modes of Intervention:  na  Summary of Progress/Problems:did not attend despite encouragement from staff  Leonia Reader 12/27/2015, 10:20 AM

## 2015-12-27 NOTE — Progress Notes (Signed)
Patient ID: David [name], male   DOB: 02/24/1956, 59 y.o.   MRN: [id number] PER STATE REGULATIONS 482.30  THIS CHART WAS REVIEWED FOR MEDICAL NECESSITY WITH RESPECT TO THE PATIENT'S ADMISSION/ DURATION OF STAY.  12/31/2015  Gary Gabrielsen Berdine Dance, RN, BSN CASE MANAGER

## 2015-12-27 NOTE — Progress Notes (Signed)
Recreation Therapy Notes  12.27.2016 @ approximately 2:30pmLRT attempted to work with patient, however patient denied LRT, stating he did not want to participate in activity. Patient presents with flat affect, mood depressed, laying in bed with lights off and curtains drawn. Patient shared he has no desire to participate in 1:1 or milieu activities. Patient shared that he wants to d/c to his home, MD and LCSW have approved this, however his mother has stated that she does not want him to d/c to his home because she does not feel he can take care of himself. Patient additionally stated he wants to d/c to his home so he can have his girlfriend over. Patient does not make references to "[facility] [place]" as with previous encounters and presents with organized thoughts.   Laureen Ochs Shakerra Red, LRT/CTRS   [name] 12/27/2015 3:51 PM

## 2015-12-27 NOTE — BHH Group Notes (Signed)
[place] LCSW Group Therapy 12/27/2015  1:15 PM   Type of Therapy: Group Therapy  Participation Level: Did Not Attend. Patient invited to participate but declined.   Tilden Fossa, MSW, [place] Worker [facility] [phone]

## 2015-12-27 NOTE — Clinical Social Work Note (Signed)
CSW met with patient to discuss his discharge plans. Patient reports that he wants to return to his home and feels that he can adequately care for himself with the help from his [facility] ACT team. He is not willing to commit to staying at a group home for an extended amount of time. CSW agreed to contact patient's ACT team and mother to discuss progress and disposition plans.   Tilden Fossa, MSW, [place] Worker [facility] [phone]

## 2015-12-27 NOTE — Progress Notes (Signed)
D. Pt had been up and visible in milieu this evening, did attempt to call 911 and was reminded that he was not allowed to use the phone for that purpose, stated that he wasn't but then hung the phone up without completing his phone call. Pt in dayroom much of the evening, watching TV, minimal interaction with staff or peers. Pt spoke about discharge and spoke about how he is hoping to be able to get out of here soon. Pt reports that he is feeling much better and that he feels that he is ready to go. Pt received all medications without incident this evening and did not verbalize any complaints of pain. A. Support and encouragement provided. R. Safety maintained, will continue to monitor.

## 2015-12-27 NOTE — Tx Team (Signed)
Interdisciplinary Treatment Plan Update (Adult)  Date:  12/26/15  Time Reviewed:  9:30 am  Progress in Treatment: Attending groups: Yes. Participating in groups:  Yes. Taking medication as prescribed:  Yes. Tolerating medication:  Yes. Family/Significant other contact made: Yes  ACT team and parents Patient understands diagnosis:  No  Limited insight Discussing patient identified problems/goals with staff:  Yes, see initial care plan. Medical problems stabilized or resolved:  Yes. Denies suicidal/homicidal ideation: Yes. Issues/concerns per patient self-inventory:  No. Other:  New problem(s) identified:  Discharge Plan or Barriers: see below  Reason for Continuation of Hospitalization: Medication stabilization Disorganization, Delusions  Comments:  Patient presents disorganized , has limited insight, is a poor historian. Collateral information was obtained from ACTT. Will continue medication readjustment. Will cross taper Zyprexa with Invega . Plan to DC pt on Invega sustenna IM - if he tolerates it well. Will not restart Haldol at this time. Will continue Cogentin 0.5 mg po qhs for EPS. Will continue Gabapentin 300 mg po bid for anxiety sx. Will continue Remeron 15 mg po qhs for affective sx as well as sleep.  12/08/15: Patient continues to present as disorganized ,delusional. Pt also has labile mood, requires a lot of redirection on the unit. Will continue medication readjustment.  Will increase Invega to 9 mg po qhs. Plan to DC pt on Invega sustenna IM - if he tolerates it well. Will provide Invega sustenna 234 mg IM tomorrow. Will not restart Haldol at this time. Will continue Cogentin 0.5 mg po qhs for EPS. Will continue Gabapentin 300 mg po bid for anxiety sx. Will continue Remeron 15 mg po qhs for affective sx as well as sleep.  12/13/15: Will continue Invega sustenna IM- 234 mg x 1 dose given 12/08/15, Invega sustenna 156 mg IM to be given on 12/12/15. AIMS - 0 (  12/12/15) Will discontinue Invega po . Will restart Haldol - start with a low dose of 5 mg po qhs for augmenting the effect of Invega sustenna . Will continue Cogentin 0.5 mg po qhs for EPS. Will continue Gabapentin 300 mg po bid for anxiety sx. Will continue Remeron 15 mg po qhs for affective sx as well as sleep.  12/15/15: Pt is currently restarted on Haldol , dose increased to 10 mg po qhs today for augmenting the effect of Invega. Pt on admission was on two antipsychotics- although he was not compliant.  Will continue Cogentin 0.5 mg po qhs for EPS. Will continue Gabapentin 300 mg po bid for anxiety sx. Will continue Remeron 15 mg po qhs for affective sx as well as sleep.  12/20/15:  No med changes since last treatment plan.  Estimated length of stay: 2-3 days days  New goal(s):  Review of initial/current patient goals per problem list:   Review of initial/current patient goals per problem list:  1. Goal(s): Patient will participate in aftercare plan   Met: Goal Progressing   Target date: 3-5 days post admission date   As evidenced by: Patient will participate within aftercare plan AEB aftercare provider and housing plan at discharge being identified.   12/06/15: Return home, follow up outpt  12/26: Referrals pending for [facility] placement but patient is not agreeable at this time. He wants to return home to follow up with outpatient services at discharge.    5. Goal(s): Patient will demonstrate decreased signs of psychosis  * Met: Progressing  * Target date: 3-5 days post admission date  * As evidenced by: Patient will demonstrate  decreased frequency of AVH or return to baseline function 12/06/15:  Bizarre, disorganized, poor historian 12/08/15:  Speech is now clearer, intelligible, and pt is more goal directed.  However, remains disorganized, lacking insight and displaying mood lability.  Upset tha he cannot go home on pass to get his shoes 12/13/15:  Continues to be  disorganized, tangential with flight of ideas 12/15/15:  Is now thinking more clearly, expressing self well.  Less mood lability. Tends to be grumpier in the AM 12/20/15:  Mood stable; delusions are lessening, and pt is able to carry on meaningful conversation for longer periods of time 12/26: Goal Progressing.    Attendees: Patient:    Family:    Physician: Dr. Parke Poisson, MD 12/26/15 9:30 AM   Nursing: Elenora Gamma, RN 12/26/15 9:30 AM  CSW: Erasmo Downer Malisha Mabey, LCSWA  12/26/15 9:30 AM  Other:    Other:    Other:    Other:    Other: Gillie Manners, NP 12/26/15 9:30 AM  Other:          Scribe for Treatment Team:   Tilden Fossa, MSW, [place] Worker [facility] [phone]

## 2015-12-28 MED ORDER — LISINOPRIL 10 MG PO TABS
10.0000 mg | ORAL_TABLET | Freq: Every day | ORAL | Status: DC
Start: 2015-12-28 — End: 2015-12-29
  Administered 2015-12-28 – 2015-12-29 (×2): 10 mg via ORAL
  Filled 2015-12-28 (×2): qty 2
  Filled 2015-12-28 (×3): qty 1

## 2015-12-28 MED ORDER — GABAPENTIN 300 MG PO CAPS
600.0000 mg | ORAL_CAPSULE | Freq: Every day | ORAL | Status: DC
  Administered 2015-12-29: 600 mg via ORAL
  Filled 2015-12-28 (×3): qty 2

## 2015-12-28 MED ORDER — LOSARTAN POTASSIUM 50 MG PO TABS
50.0000 mg | ORAL_TABLET | Freq: Every day | ORAL | Status: DC
  Administered 2015-12-29 – 2015-12-30 (×2): 50 mg via ORAL
  Filled 2015-12-28 (×4): qty 1

## 2015-12-28 MED ORDER — GABAPENTIN 300 MG PO CAPS
300.0000 mg | ORAL_CAPSULE | Freq: Every day | ORAL | Status: AC
  Administered 2015-12-28: 300 mg via ORAL
  Filled 2015-12-28: qty 1

## 2015-12-28 NOTE — BHH Group Notes (Signed)
[place] LCSW Group Therapy 12/28/2015  1:15 PM Type of Therapy: Group Therapy Participation Level: Active  Participation Quality: Attentive, Sharing and Supportive  Affect: Depressed and flat  Cognitive: Alert and Oriented  Insight: Developing/Improving and Engaged  Engagement in Therapy: Developing/Improving and Engaged  Modes of Intervention: Clarification, Confrontation, Discussion, Education, Exploration, Limit-setting, Orientation, Problem-solving, Rapport Building, Art therapist, Socialization and Support  Summary of Progress/Problems: The topic for group today was emotional regulation. This group focused on both positive and negative emotion identification and allowed group members to process ways to identify feelings, regulate negative emotions, and find healthy ways to manage internal/external emotions. Group members were asked to reflect on a time when their reaction to an emotion led to a negative outcome and explored how alternative responses using emotion regulation would have benefited them. Group members were also asked to discuss a time when emotion regulation was utilized when a negative emotion was experienced. Patient participated minimally in discussion but identified things that he does at home to keep busy such as listening to music, cleaning, and watching tv. He reports that he gets lonely at home at times. CSW and other group members provided patient with emotional support and encouragement.   Tilden Fossa, MSW, [place] Worker [facility] [phone]

## 2015-12-28 NOTE — Progress Notes (Signed)
D: Pt has anxious affect and mood.  When asked how his day has been, pt smiles and states "been all right."  Pt then requested "something for sleep."  He reported that he would like his Haldol decreased from 12 mg at night to 10 mg at night.  Pt denies SI/HI, denies hallucinations, reports pain from headache of 3/10.  Pt has been visible in milieu interacting with peers and staff appropriately.  A: Introduced self to pt.  Met with pt 1:1 and offered support and encouragement.  Actively listened to pt.  Medications administered per order.  PRN medication administered for anxiety, sleep, and pain.  Pt was encouraged to discuss possible medication changes with provider.  Medication education provided.   R: Pt is compliant with medications.  Pt verbally contracts for safety.  Will continue to monitor and assess.

## 2015-12-28 NOTE — Clinical Social Work Note (Signed)
CSW spoke w mother/guardian and outlined discharge plan.  Guardian does not want patient placed in group living situation for a "long time", does not want him to go to ALF for a year, wants him to return to his home and receive support from ACT team.  CSW discussed plan for ACT team RN to meet w patient and CSW tomorrow, ACT team's request to monitor patient's medications and dispense these approx noon daily.  Mother agreeable and requests phone update after meeting tomorrow as she cannot attend due to work schedule.  States that family member may be able to pick up patient at discharge but prefers that ACT team transport back home.  Wants ACT team and [facility] staff to stress to patient that using alcohol or unprescribed substances has been known to destabilize him.  States patient was "catatonic" for many months at [facility] before receiving ECT - pt was released in April 2016 to return home.  Was initially doing well, driving and interacting appropriately w others.  Patient began to have difficulty w sleep, recently had two issues w behavior at public places where police were called to remove patient.  Mother struggles w being guardian for grown son, feels pt is angry w her for restricting his freedom.  CSW provided reassurance and encouragement to guardian/mother, will call after meeting w ACT team to discuss discharge plan.   Edwyna Shell, LCSW Lead Clinical Social Worker Phone:  [phone]

## 2015-12-28 NOTE — BHH Group Notes (Signed)
   [facility] LCSW Aftercare Discharge Planning Group Note  12/28/2015  8:45 AM   Participation Quality: Alert, Appropriate and Oriented  Mood/Affect: Depressed and Flat  Depression Rating: 1  Anxiety Rating: 1  Thoughts of Suicide: Pt denies SI/HI  Will you contract for safety? Yes  Current AVH: Pt denies  Plan for Discharge/Comments: Pt attended discharge planning group and actively participated in group. CSW provided pt with today's workbook. Patient complains of not sleeping well. He reports low levels of depression and anxiety but appears with flat and depressed affect. Patient continues to decline group home placement at discharge, reports that he wants to return home at discharge.   Transportation Means: Pt reports access to transportation  Supports: No supports mentioned at this time  Tilden Fossa, MSW, [place] Social Worker [employer] [phone]

## 2015-12-28 NOTE — Progress Notes (Signed)
David [name] has been in his room much of the morning.  He stated that he didn't sleep that well and he doesn't feel well.  He took his AM medications without difficulty.  He states that he cannot stand the constant chatter on the unit.  He denies any pain.  His BP has been elevated and notified the practitioner about elevated BP.  He denies any SI/HI or A/V hallucinations.  He completed his self inventory and reports that his depression, hopelessness and anxiety are all 1/10.  He declined to provide a goal for today.  Encouraged continued participation in group and unit activities.  Q 15 minute checks maintained for safety.  We will continue to monitor the progress towards his goals.

## 2015-12-28 NOTE — Progress Notes (Signed)
Adult Psychoeducational Group Note  Date:  12/28/2015 Time:  8:42 PM  Group Topic/Focus:  Wrap-Up Group:   The focus of this group is to help patients review their daily goal of treatment and discuss progress on daily workbooks.  Participation Level:  Minimal  Participation Quality:  Appropriate  Affect:  Flat  Cognitive:  Lacking  Insight: Lacking  Engagement in Group:  Engaged  Modes of Intervention:  Socialization and Support  Additional Comments:  Patient attended and participated in group tonight. He reports having an overall good day. He was tired for a while and took a nap. He went outside with the group and socialized.  Salley Scarlet [facility] 12/28/2015, 8:42 PM

## 2015-12-28 NOTE — Plan of Care (Signed)
Problem: Alteration in thought process Goal: STG-Patient is able to sleep at least 6 hours per night Outcome: Progressing According to flow sheet, pt slept over 6 hours on 12/26/15.

## 2015-12-28 NOTE — Progress Notes (Signed)
Patient ID: David [name], male   DOB: 1956/06/20, 59 y.o.   MRN: [id number] Patient ID: David [name], male   DOB: [birth year], 59 y.o.   MRN: [id number]  [facility] MD Progress Note  12/28/2015  David [name]  MRN:  [id number]   Subjective: David [name] states "My mother does not want me to have a life. I was doing fine living on my own. I mean I would get up eat something and drink my coffee. My mother just thinks she knows what is best for me and always has. She wants me to move into a group home. I do not like that idea because I can live on my own. I want to have a girlfriend, drink a beer or two sometimes without mother hovering over me. I'm an adult"  Objective: David [name] has an ACT Team who will be helping to manage David [name]'s medications. They would want his medicines made once daily as David [name] is known to refuse night medicines. His medication doses has been adjusted. Initiated Lisinopril 10 mg for high blood pressure. David [name] is in progress.  Principal Problem: Paranoid schizophrenia ([place]) Diagnosis:   Patient Active Problem List   Diagnosis Date Noted  . 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 History of idiopathic thrombocytopenic purpura [Z86.2] 12/05/2015  . Paranoid schizophrenia ([place]) [F20.0] 12/03/2015  . HTN (hypertension) [I10] 03/04/2012  . GERD (gastroesophageal reflux disease) [K21.9] 03/04/2012  . Insomnia [G47.00] 03/04/2012  . CKD (chronic kidney disease) stage 2, GFR 60-89 ml/min [N18.2] 03/04/2012  . Schizophrenia, paranoid, chronic ([place]) [F20.0] 02/18/2012   Total Time spent with patient: 20 minutes  Past Psychiatric History: Pt was admitted to [facility] - schizophrenia - in 2013, was discharged on Haldol decanoate IM - and was referred to [facility]. Pt also had recent hospitalization at [facility] - April 2016. Pt has an ACTT.  Past Medical History:  Past Medical History  Diagnosis Date  . Schizophrenia ([place])   . Pneumonia   . Anxiety   . GERD (gastroesophageal reflux disease)   . COPD  (chronic obstructive pulmonary disease) [facility])     Past Surgical History  Procedure Laterality Date  . Tonsillectomy     Family History:  Family History  Problem Relation Age of Onset  . Arthritis Mother   . Hypertension Mother    Family Psychiatric  History: Pt did not express any hx of mental illness in family.  Social History: Pt lives by self in [place], [place]. Pt is single. History  Alcohol Use No     History  Drug Use No    Comment: denies    Social History   Social History  . Marital Status: Legally Separated    Spouse Name: N/A  . Number of Children: N/A  . Years of Education: N/A   Social History Main Topics  . Smoking status: Current Every Day Smoker -- 1.00 packs/day for 40 years    Types: Cigarettes    Start date: 04/14/1974  . Smokeless tobacco: Never Used  . Alcohol Use: No  . Drug Use: No     Comment: denies  . Sexual Activity: No   Other Topics Concern  . None   Social History Narrative   Additional Social History:   Sleep: Good  Appetite:  Good  Current Medications: Current Facility-Administered Medications  Medication Dose Route Frequency Provider Last Rate Last Dose  . acetaminophen (TYLENOL) tablet 650 mg  650 mg Oral Q6H PRN Derrill Center, NP   650 mg at 12/27/15 2200  .  albuterol (PROVENTIL HFA;VENTOLIN HFA) 108 (90 BASE) MCG/ACT inhaler 2 puff  2 puff Inhalation Q6H PRN Derrill Center, NP   2 puff at 12/23/15 2109  . alum & mag hydroxide-simeth (MAALOX/MYLANTA) 200-200-20 MG/5ML suspension 30 mL  30 mL Oral Q4H PRN Derrill Center, NP   30 mL at 12/22/15 1723  . benztropine (COGENTIN) tablet 0.5 mg  0.5 mg Oral QHS Derrill Center, NP   0.5 mg at 12/27/15 2116  . feeding supplement (ENSURE ENLIVE) (ENSURE ENLIVE) liquid 237 mL  237 mL Oral BID BM Saramma Eappen, MD   237 mL at 12/28/15 1007  . gabapentin (NEURONTIN) capsule 300 mg  300 mg Oral BID Derrill Center, NP   300 mg at 12/28/15 1643  . haloperidol (HALDOL) tablet 12 mg  12 mg  Oral QHS [name], MD   12 mg at 12/27/15 2116  . hydrOXYzine (ATARAX/VISTARIL) tablet 25 mg  25 mg Oral QHS,MR X 1 Derrill Center, NP   25 mg at 12/27/15 2325  . lidocaine (LIDODERM) 5 % 1 patch  1 patch Transdermal Q24H [name], MD   1 patch at 12/28/15 1107  . lisinopril (PRINIVIL,ZESTRIL) tablet 10 mg  10 mg Oral Daily Encarnacion Slates, NP      . LORazepam (ATIVAN) tablet 1 mg  1 mg Oral BID PRN [name], MD   1 mg at 12/27/15 2026  . [START ON 12/29/2015] losartan (COZAAR) tablet 50 mg  50 mg Oral Daily Encarnacion Slates, NP      . magnesium hydroxide (MILK OF MAGNESIA) suspension 30 mL  30 mL Oral Daily PRN Derrill Center, NP   30 mL at 12/05/15 0239  . menthol-cetylpyridinium (CEPACOL) lozenge 3 mg  1 lozenge Oral PRN [name], MD   3 mg at 12/19/15 1117  . mirtazapine (REMERON) tablet 15 mg  15 mg Oral QHS PRN Derrill Center, NP   15 mg at 12/27/15 2116  . nicotine (NICODERM CQ - dosed in mg/24 hours) patch 21 mg  21 mg Transdermal Daily [name], MD   21 mg at 12/28/15 0809  . OLANZapine (ZYPREXA) tablet 5 mg  5 mg Oral TID PRN [name], MD   5 mg at 12/28/15 [id number]   Or  . OLANZapine (ZYPREXA) injection 5 mg  5 mg Intramuscular TID PRN [name], MD      . Derrill Memo ON 01/10/2016] paliperidone (INVEGA SUSTENNA) injection 156 mg  156 mg Intramuscular Q28 days [name], MD      . pantoprazole (PROTONIX) EC tablet 40 mg  40 mg Oral Daily Derrill Center, NP   40 mg at 12/28/15 [id number]   Lab Results:  No results found for this or any previous visit (from the past 92 hour(s)).  Physical Findings: AIMS: Facial and Oral Movements Muscles of Facial Expression: None, normal Lips and Perioral Area: None, normal Jaw: None, normal Tongue: None, normal,Extremity Movements Upper (arms, wrists, hands, fingers): None, normal Lower (legs, knees, ankles, toes): None, normal, Trunk Movements Neck, shoulders, hips: None, normal, Overall Severity Severity of abnormal  movements (highest score from questions above): None, normal Incapacitation due to abnormal movements: None, normal Patient's awareness of abnormal movements (rate only patient's report): No Awareness, Dental Status Current problems with teeth and/or dentures?: No Does patient usually wear dentures?: No  CIWA:  CIWA-Ar Total: 0 COWS:  COWS Total Score: 3  Musculoskeletal: Strength & Muscle Tone: within normal limits  Gait &  Station: normal  Patient leans: N/A   Psychiatric Specialty Exam: Review of Systems  Constitutional: Negative.   HENT: Negative.   Eyes: Negative.   Respiratory: Negative.   Cardiovascular: Negative.   Genitourinary: Negative.   Musculoskeletal: Negative.   Skin: Negative.   Neurological: Negative.   Endo/Heme/Allergies: Negative.   Psychiatric/Behavioral: Positive for hallucinations (delusions). Negative for depression, suicidal ideas, memory loss and substance abuse. The patient is nervous/anxious. The patient does not have insomnia.        Paranoia and delusions  All other systems reviewed and are negative.   Blood pressure 150/82, pulse 99, temperature 98.8 F (37.1 C), temperature source Oral, resp. rate 17, height 5\' 10"  (1.778 m), weight 86.183 kg (190 lb).Body mass index is 27.26 kg/(m^2).  General Appearance: Casual and Fairly Groomed   Engineer, water::  Good   Speech:  Normal Rate  Volume:  Normal   Mood:  Anxious     Affect:  Appropriate   Thought Process:  Linear  Orientation:  Full (Time, Place, and Person)  Thought Content:  Delusions and Rumination   Suicidal Thoughts:  No  Homicidal Thoughts:  No  Memory:  Immediate;   Fair Recent;   Fair Remote;   Poor  Judgement:  Impaired  Insight:  Shallow  Psychomotor Activity:  Normal  Concentration:  Fair  Recall:  [place]: Fair  Akathisia:  No  Handed:  Right  AIMS (if indicated):     Assets:  Desire for Improvement Social Support Others:  access to health  care  ADL's:  Intact  Cognition: WNL   Sleep:  Number of Hours: 6   Treatment Plan: Patient continues to improve, although has delusions on and off which may be chronic - will continue treatment.Patient to have supervised phone calls. Today, on 12/28/2015, pt continues to be chronic in terms of psychosis yet was able to participate well in the assessment.   Daily contact with patient to assess and evaluate symptoms and progress in treatment and Medication management   Pt received Invega sustenna IM-  234 mg x 1 dose given 12/08/15,  Invega sustenna 156 mg IM  -  12/12/15. AIMS - 0 ( 12/21/15) Pt is currently on Haldol , dose at 12 mg po qhs  for augmenting the effect of Invega. Pt on admission was on two antipsychotics- although he was not compliant.  Continue Cogentin 0.5 mg po qhs for EPS. Combined Gabapentin dose to 600 mg po daily for agitation.. Continue Remeron 15 mg po qhs for affective sx as well as sleep. Continue Vistaril 25 mg PRN with x1 Repeat or insomnia. Will restart Ativan 1 mg po bid prn - for anxiety sx. Will continue to monitor vitals ,medication compliance and treatment side effects while patient is here.  Will monitor for medical issues as well as call consult as needed.   CSW will start working on disposition. Pt to be referred to ALF for placement - Mother is the legal guardian. Continue Recreational therapy consult.  Patient to participate in therapeutic milieu   Encarnacion Slates, FNP,  FNP-C 12/28/2015, 16:37  Agree with NP progress note as above Neita Garnet, MD

## 2015-12-28 NOTE — Progress Notes (Signed)
D: Pt has anxious affect and mood.  When asked about his day, pt reports "I went to a few groups, been thinking a whole lot."  Pt reports he may be discharging soon.  When asked how he felt about this, pt states "I feel good about it, my ACT team is coming from [place] and my mom is coming tomorrow."  Pt reports he feels safe to discharge soon.  Pt denies SI/HI, denies hallucinations, denies pain.  Pt has been visible in milieu interacting with peers and staff appropriately.  Pt attended evening group.   A:  Met with pt 1:1 and provided support and encouragement.  Actively listened to pt.  Medications administered per order.  PRN medication administered for anxiety and sleep. R: Pt is compliant with medications.  Pt verbally contracts for safety.  Will continue to monitor and assess.

## 2015-12-28 NOTE — Clinical Social Work Note (Signed)
CSW left VM for guardian/mother David [name], [phone] requesting call back to discuss discharge planning.  CSW attempted to contact CSW spoke w David Fairy, RN on [facility] ACT team.  States their major concern was his delisional status, noncompliance w medications.  Per David [name], pt refused for ACT team to assist w med mgmt, ACT team aware that patient does not take medications as they are counting his pills and verifying count vs med instructions.  States patient's brother has tried to assist but lives out of town.  ACT team suggests that patient could be advised that he must turn over his medications to ACT team who will then prepare a med box and make frequent visits (at least 3 days/week, perhaps beginning w daily visits) to assist w regular compliance.  [place]  States that their perception of patient's major issue are his delusions and grandiosity as well as emotional lability and irritability.  Suggest majority of meds be dispensed at noon so that ACT team can monitor.  Prefers patient to be on scheduled meds (no PRNs)  twice/day wi minimal meds given at night so ACT team can watch patient take his meds when then visit approx noon time daily.    David Shell, LCSW Lead Clinical Social Worker Phone:  [phone]

## 2015-12-29 LAB — COMPREHENSIVE METABOLIC PANEL
ALBUMIN: 3.6 g/dL (ref 3.5–5.0)
ALT: 50 U/L (ref 17–63)
ANION GAP: 7 (ref 5–15)
AST: 26 U/L (ref 15–41)
Alkaline Phosphatase: 84 U/L (ref 38–126)
BUN: 25 mg/dL — AB (ref 6–20)
CHLORIDE: 102 mmol/L (ref 101–111)
CO2: 29 mmol/L (ref 22–32)
Calcium: 8.9 mg/dL (ref 8.9–10.3)
Creatinine, Ser: 1.32 mg/dL — ABNORMAL HIGH (ref 0.61–1.24)
GFR calc Af Amer: >60 mL/min (ref >60)
GFR, EST NON AFRICAN AMERICAN: 58 mL/min — AB (ref >60)
Glucose, Bld: 99 mg/dL (ref 65–99)
POTASSIUM: 4.7 mmol/L (ref 3.5–5.1)
Sodium: 138 mmol/L (ref 135–145)
Total Bilirubin: 0.6 mg/dL (ref 0.3–1.2)
Total Protein: 6.3 g/dL — ABNORMAL LOW (ref 6.5–8.1)

## 2015-12-29 NOTE — Progress Notes (Signed)
Patient ID: David [name], male   DOB: 1956/12/17, 59 y.o.   MRN: [id number]  D: Patient drowsy on approach this am. Reports slept fairly well. Rates depression, anxiety, and hopelessness at a "1". Currently denies any SI/HI or a/v hallucinations. No complaints this am. A: Staff will monitor on q 15 minute checks, follow treatment plan, and give meds as ordered. R: Cooperative on the unit at this time.

## 2015-12-29 NOTE — Clinical Social Work Note (Signed)
CSW, David [name], mother (guardian), and [facility] ACTT staff member Diane to discuss David [name]'s progress and discharge plans. David [name] agreeable to return home to follow up with ACT team daily for medication needs. It was emphasized that alcohol or marijuana use will likely cause David [name] to destabilize and interact with his medications. David [name] appeared ambivalent about his use but agreed to not using. He states that his goal is to return home as soon as possible. He declined interest in group programming at [facility]. David [name] appeared flat and depressed but did smile several times during discussion.    Tilden Fossa, MSW, [place] Worker [facility] [phone]

## 2015-12-29 NOTE — Progress Notes (Signed)
Recreation Therapy Notes  Approximately 8:15am. LRT attempted again to investigate additional tx for patient. Patient was observed to be laying in bed, facing away from door, with curtains closed. LRT attempted to open curtains, patient refused LRT offer. LRT inquired what activities patient has interest in. Patient expressed no desire to engage in recreation therapy tx, but did again express desire to return home. Patient shared he has 2 meetings today and he expects 1 will result in d/c to patient home.   Laureen Ochs Tynetta Bachmann, LRT/CTRS    Leeam Cedrone L 12/29/2015 1:15 PM

## 2015-12-29 NOTE — Progress Notes (Signed)
Patient ID: David [name], male   DOB: Oct 04, 1956, 59 y.o.   MRN: [id number] D: Client visible on the unit, in the dayroom, interacts appropriately. Client reports "Dr. Shea Evans is my doctor she says I'll be going home tomorrow, but I don't know the ACT team is suppose to pick me up" "she want me to take a shot, but I ain't taking no stupid shot, it ain't nothing wrong with my mental health, my medicine working just like it is" "they don't need to mess with my medicine or I'll end up right back here" A: Writer encouraged client to speak to doctor about injection. Medications reviewed administered as ordered. Staff will monitor q38min for safety. R: Client is safe on the unit, attended group.

## 2015-12-29 NOTE — BHH Group Notes (Signed)
[place] LCSW Group Therapy 12/29/2015  1:15 PM   Type of Therapy: Group Therapy  Participation Level: Did Not Attend. Patient invited to participate but declined.   Tilden Fossa, MSW, [place] Worker [facility] [phone]

## 2015-12-29 NOTE — Progress Notes (Signed)
[place] Group Notes:  (Nursing/MHT/Case Management/Adjunct)  Date:  12/29/2015  Time:  8:52 PM  Type of Therapy:  Psychoeducational Skills  Participation Level:  Active  Participation Quality:  Resistant  Affect:  Angry  Cognitive:  Lacking  Insight:  Lacking  Engagement in Group:  Monopolizing and Off Topic  Modes of Intervention:  Education  Summary of Progress/Problems: The patient initially became very upset with this Pryor Curia as he was addressed by his name rather than by his nickname. He indicated that his day went "all right" without offering any additional details. His goal for tomorrow is to leave things up to the staff to handle.   Archie Balboa S 12/29/2015, 8:52 PM

## 2015-12-29 NOTE — Progress Notes (Signed)
[facility] MD Progress Note  12/29/2015 8:42 PM David [name]  MRN:  [id number] Subjective:  States that before he came here he was placed on Haldol D while still keeping him on the PO and he was "OD." he states that overall he is feeling better now. He feels the medications are working how they are right now and denies side effects. There are no active positive psychotic symptoms evidenced Principal Problem: Paranoid schizophrenia ([place]) Diagnosis:   Patient Active Problem List   Diagnosis Date Noted  . Hyperprolactinemia ([place]) [E22.1] 12/07/2015  . History of idiopathic thrombocytopenic purpura [Z86.2] 12/05/2015  . Paranoid schizophrenia ([place]) [F20.0] 12/03/2015  . HTN (hypertension) [I10] 03/04/2012  . GERD (gastroesophageal reflux disease) [K21.9] 03/04/2012  . Insomnia [G47.00] 03/04/2012  . CKD (chronic kidney disease) stage 2, GFR 60-89 ml/min [N18.2] 03/04/2012  . Schizophrenia, paranoid, chronic ([place]) [F20.0] 02/18/2012   Total Time spent with patient: 20 minutes  Past Psychiatric History: see admission H and P  Past Medical History:  Past Medical History  Diagnosis Date  . Schizophrenia ([place])   . Pneumonia   . Anxiety   . GERD (gastroesophageal reflux disease)   . COPD (chronic obstructive pulmonary disease) [facility])     Past Surgical History  Procedure Laterality Date  . Tonsillectomy     Family History:  Family History  Problem Relation Age of Onset  . Arthritis Mother   . Hypertension Mother    Family Psychiatric  History: see Admission H and P Social History:  History  Alcohol Use No     History  Drug Use No    Comment: denies    Social History   Social History  . Marital Status: Legally Separated    Spouse Name: N/A  . Number of Children: N/A  . Years of Education: N/A   Social History Main Topics  . Smoking status: Current Every Day Smoker -- 1.00 packs/day for 40 years    Types: Cigarettes    Start date: 04/14/1974  . Smokeless tobacco: Never Used   . Alcohol Use: No  . Drug Use: No     Comment: denies  . Sexual Activity: No   Other Topics Concern  . None   Social History Narrative   Additional Social History:                         Sleep: Fair  Appetite:  Fair  Current Medications: Current Facility-Administered Medications  Medication Dose Route Frequency Provider Last Rate Last Dose  . acetaminophen (TYLENOL) tablet 650 mg  650 mg Oral Q6H PRN Derrill Center, NP   650 mg at 12/29/15 1815  . albuterol (PROVENTIL HFA;VENTOLIN HFA) 108 (90 BASE) MCG/ACT inhaler 2 puff  2 puff Inhalation Q6H PRN Derrill Center, NP   2 puff at 12/23/15 2109  . alum & mag hydroxide-simeth (MAALOX/MYLANTA) 200-200-20 MG/5ML suspension 30 mL  30 mL Oral Q4H PRN Derrill Center, NP   30 mL at 12/22/15 1723  . benztropine (COGENTIN) tablet 0.5 mg  0.5 mg Oral QHS Derrill Center, NP   0.5 mg at 12/28/15 2206  . feeding supplement (ENSURE ENLIVE) (ENSURE ENLIVE) liquid 237 mL  237 mL Oral BID BM Saramma Eappen, MD   237 mL at 12/29/15 1104  . gabapentin (NEURONTIN) capsule 600 mg  600 mg Oral QHS Encarnacion Slates, NP      . haloperidol (HALDOL) tablet 12 mg  12 mg Oral QHS  [name], MD   12 mg at 12/28/15 2206  . hydrOXYzine (ATARAX/VISTARIL) tablet 25 mg  25 mg Oral QHS,MR X 1 Derrill Center, NP   25 mg at 12/28/15 2258  . lidocaine (LIDODERM) 5 % 1 patch  1 patch Transdermal Q24H [name], MD   1 patch at 12/29/15 1103  . LORazepam (ATIVAN) tablet 1 mg  1 mg Oral BID PRN [name], MD   1 mg at 12/28/15 1959  . losartan (COZAAR) tablet 50 mg  50 mg Oral Daily Encarnacion Slates, NP   50 mg at 12/29/15 0805  . magnesium hydroxide (MILK OF MAGNESIA) suspension 30 mL  30 mL Oral Daily PRN Derrill Center, NP   30 mL at 12/05/15 0239  . menthol-cetylpyridinium (CEPACOL) lozenge 3 mg  1 lozenge Oral PRN [name], MD   3 mg at 12/19/15 1117  . mirtazapine (REMERON) tablet 15 mg  15 mg Oral QHS PRN Derrill Center, NP   15 mg at  12/28/15 2258  . nicotine (NICODERM CQ - dosed in mg/24 hours) patch 21 mg  21 mg Transdermal Daily [name], MD   21 mg at 12/29/15 0808  . OLANZapine (ZYPREXA) tablet 5 mg  5 mg Oral TID PRN [name], MD   5 mg at 12/28/15 7846   Or  . OLANZapine (ZYPREXA) injection 5 mg  5 mg Intramuscular TID PRN [name], MD      . Derrill Memo ON 01/10/2016] paliperidone (INVEGA SUSTENNA) injection 156 mg  156 mg Intramuscular Q28 days [name], MD      . pantoprazole (PROTONIX) EC tablet 40 mg  40 mg Oral Daily Derrill Center, NP   40 mg at 12/29/15 9629    Lab Results:  Results for orders placed or performed during the hospital encounter of 12/03/15 (from the past 48 hour(s))  Comprehensive metabolic panel     Status: Abnormal   Collection Time: 12/29/15  6:25 AM  Result Value Ref Range   Sodium 138 135 - 145 mmol/L   Potassium 4.7 3.5 - 5.1 mmol/L   Chloride 102 101 - 111 mmol/L   CO2 29 22 - 32 mmol/L   Glucose, Bld 99 65 - 99 mg/dL   BUN 25 (H) 6 - 20 mg/dL   Creatinine, Ser 1.32 (H) 0.61 - 1.24 mg/dL   Calcium 8.9 8.9 - 10.3 mg/dL   Total Protein 6.3 (L) 6.5 - 8.1 g/dL   Albumin 3.6 3.5 - 5.0 g/dL   AST 26 15 - 41 U/L   ALT 50 17 - 63 U/L   Alkaline Phosphatase 84 38 - 126 U/L   Total Bilirubin 0.6 0.3 - 1.2 mg/dL   GFR calc non Af Amer 58 (L) >60 mL/min   GFR calc Af Amer >60 >60 mL/min    Comment: (NOTE) The eGFR has been calculated using the CKD EPI equation. This calculation has not been validated in all clinical situations. eGFR's persistently <60 mL/min signify possible Chronic Kidney Disease.    Anion gap 7 5 - 15    Comment: Performed at [facility]    Physical Findings: AIMS: Facial and Oral Movements Muscles of Facial Expression: None, normal Lips and Perioral Area: None, normal Jaw: None, normal Tongue: None, normal,Extremity Movements Upper (arms, wrists, hands, fingers): None, normal Lower (legs, knees, ankles, toes): None,  normal, Trunk Movements Neck, shoulders, hips: None, normal, Overall Severity Severity of abnormal movements (highest score from questions  above): None, normal Incapacitation due to abnormal movements: None, normal Patient's awareness of abnormal movements (rate only patient's report): No Awareness, Dental Status Current problems with teeth and/or dentures?: No Does patient usually wear dentures?: No  CIWA:  CIWA-Ar Total: 0 COWS:  COWS Total Score: 3  Musculoskeletal: Strength & Muscle Tone: within normal limits Gait & Station: normal Patient leans: normal  Psychiatric Specialty Exam: Review of Systems  Constitutional: Negative.   HENT: Negative.   Eyes: Negative.   Respiratory: Negative.   Cardiovascular: Negative.   Gastrointestinal: Negative.   Genitourinary: Negative.   Musculoskeletal: Negative.   Skin: Negative.   Neurological: Negative.   Endo/Heme/Allergies: Negative.   Psychiatric/Behavioral:       Trace is denying active symptoms    Blood pressure 107/75, pulse 103, temperature 97.8 F (36.6 C), temperature source Oral, resp. rate 12, height _0  (1.778 m), weight 86.183 kg (190 lb).Body mass index is 27.26 kg/(m^2).  General Appearance: Daily contact with patient to assess and evaluate symptoms and progress in treatment and Medication management  Eye Contact::  Fair  Speech:  Clear and Coherent and not spontaneous  Volume:  Decreased  Mood:  Euthymic  Affect:  Restricted  Thought Process:  Coherent answers questions would not elaborate  Orientation:  O X 3  Thought Content:  symptoms events worries concerns  Suicidal Thoughts:  No  Homicidal Thoughts:  No  Memory:  Immediate;   Fair Recent;   Fair Remote;   Fair  Judgement:  Fair  Insight:  Present and Shallow  Psychomotor Activity:  Decreased  Concentration:  Fair  Recall:  [employer] of Knowledge:Fair  Language: Fair  Akathisia:  No  Handed:  Right  AIMS (if indicated):     Assets:  Desire for  Improvement Housing Social Support  ADL's:  Intact  Cognition: WNL  Sleep:  Number of Hours: 6   Treatment Plan Summary: Daily contact with patient to assess and evaluate symptoms and progress in treatment and Medication management Supportive approach/coping skills/improve reality testing Psychotic symptoms; continue the [place] as planned Will continue the Haldol at the present dose and allow for it to be adjusted/weaned off once he is settled in the outside working with his outpatient providers ACT team will come today and reassess his mental status and give feedback in terms of readiness for D/C David [name] A 12/29/2015, 8:42 PM

## 2015-12-29 NOTE — BHH Group Notes (Signed)
[place] Group Notes:  (Nursing/MHT/Case Management/Adjunct)  Date:  12/29/2015  Time:  0900  Type of Therapy:  Nurse Education  Participation Level:  Did Not Attend  Participation Quality:    Affect:    Cognitive:    Insight:    Engagement in Group:    Modes of Intervention:    Summary of Progress/Problems:  David [name] 12/29/2015, 2:07 PM

## 2015-12-30 MED ORDER — BENZTROPINE MESYLATE 0.5 MG PO TABS: 0.5000 mg | ORAL_TABLET | Freq: Every day | ORAL | Status: AC

## 2015-12-30 MED ORDER — LOSARTAN POTASSIUM 50 MG PO TABS: 50.0000 mg | ORAL_TABLET | Freq: Every day | ORAL | Status: DC

## 2015-12-30 MED ORDER — OMEPRAZOLE 20 MG PO CPDR: 20.0000 mg | DELAYED_RELEASE_CAPSULE | Freq: Every day | ORAL | Status: DC

## 2015-12-30 MED ORDER — HALOPERIDOL 2 MG PO TABS: 12.0000 mg | ORAL_TABLET | Freq: Every day | ORAL | Status: DC

## 2015-12-30 MED ORDER — HYDROXYZINE HCL 25 MG PO TABS: 25.0000 mg | ORAL_TABLET | Freq: Every evening | ORAL | Status: AC | PRN

## 2015-12-30 MED ORDER — NICOTINE 21 MG/24HR TD PT24: 21.0000 mg | MEDICATED_PATCH | Freq: Every day | TRANSDERMAL | Status: DC

## 2015-12-30 MED ORDER — PALIPERIDONE PALMITATE 156 MG/ML IM SUSP: 156.0000 mg | INTRAMUSCULAR | Status: DC

## 2015-12-30 MED ORDER — GABAPENTIN 300 MG PO CAPS: 600.0000 mg | ORAL_CAPSULE | Freq: Every day | ORAL | Status: AC

## 2015-12-30 MED ORDER — LIDOCAINE 5 % EX PTCH: 1.0000 | MEDICATED_PATCH | CUTANEOUS | Status: DC

## 2015-12-30 MED ORDER — ALBUTEROL SULFATE HFA 108 (90 BASE) MCG/ACT IN AERS: 2.0000 | INHALATION_SPRAY | Freq: Four times a day (QID) | RESPIRATORY_TRACT | Status: DC | PRN

## 2015-12-30 MED ORDER — MIRTAZAPINE 15 MG PO TABS: 15.0000 mg | ORAL_TABLET | Freq: Every day | ORAL | Status: DC

## 2015-12-30 NOTE — BHH Suicide Risk Assessment (Signed)
[place] INPATIENT:  Family/Significant Other Suicide Prevention Education  SPE not required for this patient as he was not admitted with SI or expressed SI during hospitalization.   Tilden Fossa, MSW, [place] Worker [facility] [phone]

## 2015-12-30 NOTE — Discharge Summary (Signed)
Physician Discharge Summary Note  Patient:  David [name] is an 59 y.o., male MRN:  [id number] DOB:  03-14-1956 Patient phone:  [phone] (home)  Patient address:   [address] [place] [address],  Total Time spent with patient: 45 minutes  Date of Admission:  12/03/2015 Date of Discharge: 12/30/2015  Reason for Admission:   Pt is psychotic and labile. He is a poor historian and unable to verify symptoms, meds, allergies, medical problems or substance abuse. States it is a Mining engineer of emergency in all the tiny worlds but don't worry we are safe". Denies depression. States he had SI several months ago and today denies it. Denies HI. States he has AVH all the time. When asked for details he states "it depends on who I am talking to". He asked for 10mg  Valium. He stated his back was hurting and asked writer several times to rub it. Pt appears to be delusional, grandiose, hypersexual, labile and disorganized.   Principal Problem: Paranoid schizophrenia [facility]) Discharge Diagnoses: Patient Active Problem List   Diagnosis Date Noted  . Hyperprolactinemia ([place]) [E22.1] 12/07/2015  . History of idiopathic thrombocytopenic purpura [Z86.2] 12/05/2015  . Paranoid schizophrenia ([place]) [F20.0] 12/03/2015  . HTN (hypertension) [I10] 03/04/2012  . GERD (gastroesophageal reflux disease) [K21.9] 03/04/2012  . Insomnia [G47.00] 03/04/2012  . CKD (chronic kidney disease) stage 2, GFR 60-89 ml/min [N18.2] 03/04/2012  . Schizophrenia, paranoid, chronic ([place]) [F20.0] 02/18/2012    Past Psychiatric History: See H&P  Past Medical History:  Past Medical History  Diagnosis Date  . Schizophrenia ([place])   . Pneumonia   . Anxiety   . GERD (gastroesophageal reflux disease)   . COPD (chronic obstructive pulmonary disease) [facility])     Past Surgical History  Procedure Laterality Date  . Tonsillectomy     Family History:  Family History  Problem Relation Age of Onset  . Arthritis Mother   .  Hypertension Mother    Family Psychiatric  History: See H&P Social History:  History  Alcohol Use No     History  Drug Use No    Comment: denies    Social History   Social History  . Marital Status: Legally Separated    Spouse Name: N/A  . Number of Children: N/A  . Years of Education: N/A   Social History Main Topics  . Smoking status: Current Every Day Smoker -- 1.00 packs/day for 40 years    Types: Cigarettes    Start date: 04/14/1974  . Smokeless tobacco: Never Used  . Alcohol Use: No  . Drug Use: No     Comment: denies  . Sexual Activity: No   Other Topics Concern  . None   Social History Narrative    Hospital Course:   David [name] was admitted for Paranoid schizophrenia [facility]) , with psychosis and crisis management.  Pt was treated discharged with the medications listed below under Medication List.  Medical problems were identified and treated as needed.  Home medications were restarted as appropriate.  Improvement was monitored by observation and David [name] 's daily report of symptom reduction.  Emotional and mental status was monitored by daily self-inventory reports completed by David [name] and clinical staff.         David [name] was evaluated by the treatment team for stability and plans for continued recovery upon discharge. David [name] 's motivation was an integral factor for scheduling further treatment. Employment, transportation, bed availability, health status, family support, and any pending legal issues were  also considered during hospital stay. Pt was offered further treatment options upon discharge including but not limited to Residential, Intensive Outpatient, and Outpatient treatment.  David [name] will follow up with the services as listed below under Follow Up Information.     Upon completion of this admission the patient was both mentally and medically stable for discharge denying suicidal/homicidal ideation, auditory/visual/tactile  hallucinations, delusional thoughts and paranoia.    [address] responded well to treatment with cogentin, gabapentin, haldol, vistaril, lidoderm, remeron, and invega, without adverse effects. Initially, pt did complain of fatigue with these medications, but this resolved. Pt demonstrated improvement without reported or observed adverse effects to the point of stability appropriate for outpatient management. Pertinent labs include: Creatinine 1.32, BUN 25 , Prolactin 69.1 for which outpatient follow-up is necessary for lab recheck as mentioned below. Reviewed CBC, CMP, BAL, and UDS; all unremarkable aside from noted exceptions.   Physical Findings: AIMS: Facial and Oral Movements Muscles of Facial Expression: None, normal Lips and Perioral Area: None, normal Jaw: None, normal Tongue: None, normal,Extremity Movements Upper (arms, wrists, hands, fingers): None, normal Lower (legs, knees, ankles, toes): None, normal, Trunk Movements Neck, shoulders, hips: None, normal, Overall Severity Severity of abnormal movements (highest score from questions above): None, normal Incapacitation due to abnormal movements: None, normal Patient's awareness of abnormal movements (rate only patient's report): No Awareness, Dental Status Current problems with teeth and/or dentures?: No Does patient usually wear dentures?: No  CIWA:  CIWA-Ar Total: 0 COWS:  COWS Total Score: 3  Musculoskeletal: Strength & Muscle Tone: within normal limits Gait & Station: normal Patient leans: N/A  Psychiatric Specialty Exam: Review of Systems  Psychiatric/Behavioral: Positive for depression. Negative for suicidal ideas and hallucinations. The patient is nervous/anxious and has insomnia.   All other systems reviewed and are negative.   Blood pressure 109/76, pulse 104, temperature 98.2 F (36.8 C), temperature source Oral, resp. rate 18, height 5\' 10"  (1.778 m), weight 86.183 kg (190 lb).Body mass index is 27.26 kg/(m^2).   SEE MD PSE within the [place]   Have you used any form of tobacco in the last 30 days? (Cigarettes, Smokeless Tobacco, Cigars, and/or Pipes): Yes  Has this patient used any form of tobacco in the last 30 days? (Cigarettes, Smokeless Tobacco, Cigars, and/or Pipes) Yes, Yes, A prescription for an FDA-approved tobacco cessation medication was offered at discharge and the patient refused  Metabolic Disorder Labs:  Lab Results  Component Value Date   HGBA1C 5.5 12/06/2015   MPG 111 12/06/2015   MPG 103 02/23/2012   Lab Results  Component Value Date   PROLACTIN 69.1* 12/06/2015   Lab Results  Component Value Date   CHOL 170 12/06/2015   TRIG 108 12/06/2015   HDL 37* 12/06/2015   CHOLHDL 4.6 12/06/2015   VLDL 22 12/06/2015   LDLCALC 111* 12/06/2015   LDLCALC 108* 02/23/2012    See Psychiatric Specialty Exam and Suicide Risk Assessment completed by Attending Physician prior to discharge.  Discharge destination:  Home  Is patient on multiple antipsychotic therapies at discharge:  No   Has Patient had three or more failed trials of antipsychotic monotherapy by history:  No  Recommended Plan for Multiple Antipsychotic Therapies: NA     Medication List    STOP taking these medications        ferrous sulfate 325 (65 FE) MG tablet     haloperidol lactate 5 MG/ML injection  Commonly known as:  HALDOL     OLANZapine 10  MG tablet  Commonly known as:  ZYPREXA      TAKE these medications      Indication   albuterol 108 (90 Base) MCG/ACT inhaler  Commonly known as:  PROVENTIL HFA;VENTOLIN HFA  Inhale 2 puffs into the lungs every 6 (six) hours as needed for shortness of breath.   Indication:  Asthma     benztropine 0.5 MG tablet  Commonly known as:  COGENTIN  Take 1 tablet (0.5 mg total) by mouth at bedtime.   Indication:  Extrapyramidal Reaction caused by Medications     gabapentin 300 MG capsule  Commonly known as:  NEURONTIN  Take 2 capsules (600 mg total) by mouth at  bedtime.   Indication:  Agitation     haloperidol 2 MG tablet  Commonly known as:  HALDOL  Take 6 tablets (12 mg total) by mouth at bedtime.   Indication:  Psychosis     hydrOXYzine 25 MG tablet  Commonly known as:  ATARAX/VISTARIL  Take 1 tablet (25 mg total) by mouth at bedtime as needed (insomnia).   Indication:  insomnia     lidocaine 5 %  Commonly known as:  LIDODERM  Place 1 patch onto the skin daily. Remove & Discard patch within 12 hours or as directed by MD   Indication:  back pain     losartan 50 MG tablet  Commonly known as:  COZAAR  Take 1 tablet (50 mg total) by mouth daily.   Indication:  High Blood Pressure     mirtazapine 15 MG tablet  Commonly known as:  REMERON  Take 1 tablet (15 mg total) by mouth at bedtime.   Indication:  Trouble Sleeping, Major Depressive Disorder     nicotine 21 mg/24hr patch  Commonly known as:  NICODERM CQ - dosed in mg/24 hours  Place 1 patch (21 mg total) onto the skin daily.   Indication:  Nicotine Addiction     omeprazole 20 MG capsule  Commonly known as:  PRILOSEC  Take 1 capsule (20 mg total) by mouth daily.   Indication:  Gastroesophageal Reflux Disease     paliperidone 156 MG/ML Susp injection  Commonly known as:  INVEGA SUSTENNA  Inject 1 mL (156 mg total) into the muscle every 28 (twenty-eight) days. TO BE GIVEN AT CLINIC  Start taking on:  01/10/2016   Indication:  Schizophrenia           Follow-up Information    Follow up with [facility] ACT Team.   Why:  Please contact your ACT Team at discharge to resume services.    Contact information:   [address], [place], [place] [address] Phone: [phone]      Follow-up recommendations:  Activity:  As tolerated Diet:  Heart healthy with low sodium.  Comments:   Take all medications as prescribed. Keep all follow-up appointments as scheduled.  Do not consume alcohol or use illegal drugs while on prescription medications. Report any adverse effects from  your medications to your primary care provider promptly.  In the event of recurrent symptoms or worsening symptoms, call 911, a crisis hotline, or go to the nearest emergency department for evaluation.   Signed: Benjamine Mola, FNP-BC 12/30/2015, 10:23 AM  I personally assessed the patient and formulated the plan Geralyn Flash A. Sabra Heck, M.D.

## 2015-12-30 NOTE — BHH Suicide Risk Assessment (Signed)
[facility] Discharge Suicide Risk Assessment   Demographic Factors:  Male and Caucasian  Total Time spent with patient: 20 minutes  Musculoskeletal: Strength & Muscle Tone: within normal limits Gait & Station: normal Patient leans: normal  Psychiatric Specialty Exam: Physical Exam  Review of Systems  Constitutional: Negative.   HENT: Negative.   Eyes: Negative.   Respiratory: Negative.   Cardiovascular: Negative.   Gastrointestinal: Negative.   Genitourinary: Negative.   Musculoskeletal: Negative.   Skin: Negative.   Neurological: Negative.   Endo/Heme/Allergies: Negative.   Psychiatric/Behavioral: Positive for depression.    Blood pressure 109/76, pulse 104, temperature 98.2 F (36.8 C), temperature source Oral, resp. rate 18, height 5\' 10"  (1.778 m), weight 86.183 kg (190 lb).Body mass index is 27.26 kg/(m^2).  General Appearance: Fairly Groomed  Engineer, water::  Fair  Speech:  Clear and [id number]  Volume:  Decreased  Mood:  Euthymic  Affect:  Restricted  Thought Process:  Coherent and Goal Directed  Orientation:  Full (Time, Place, and Person)  Thought Content:  plans as he moves on  Suicidal Thoughts:  No  Homicidal Thoughts:  No  Memory:  Immediate;   Fair Recent;   Fair Remote;   Fair  Judgement:  Fair  Insight:  Present and Shallow  Psychomotor Activity:  Decreased  Concentration:  Fair  Recall:  [employer] of Knowledge:Fair  Language: Fair  Akathisia:  No  Handed:  Right  AIMS (if indicated):     Assets:  Desire for Improvement Housing Social Support  Sleep:  Number of Hours: 6.25  Cognition: WNL  ADL's:  Intact   Have you used any form of tobacco in the last 30 days? (Cigarettes, Smokeless Tobacco, Cigars, and/or Pipes): Yes  Has this patient used any form of tobacco in the last 30 days? (Cigarettes, Smokeless Tobacco, Cigars, and/or Pipes) Yes, A prescription for an FDA-approved tobacco cessation medication was offered at discharge and the patient  refused  Mental Status Per Nursing Assessment::   On Admission:     Current Mental Status by Physician: In full contact with reality. No evidence of overt acute psychotic symptoms. (if anything some negative symptoms) no SI plans or intent. Willing to continue to work with the ACT  Loss Factors: NA  Historical Factors: NA  Risk Reduction Factors:   Living with another person, especially a relative and Positive social support  Continued Clinical Symptoms:  Schizophrenia:   Paranoid or undifferentiated type  Cognitive Features That Contribute To Risk:  Closed-mindedness, Polarized thinking and Thought constriction (tunnel vision)    Suicide Risk:  Minimal: No identifiable suicidal ideation.  Patients presenting with no risk factors but with morbid ruminations; may be classified as minimal risk based on the severity of the depressive symptoms  Principal Problem: Paranoid schizophrenia [facility]) Discharge Diagnoses:  Patient Active Problem List   Diagnosis Date Noted  . Hyperprolactinemia ([place]) [E22.1] 12/07/2015  . History of idiopathic thrombocytopenic purpura [Z86.2] 12/05/2015  . Paranoid schizophrenia ([place]) [F20.0] 12/03/2015  . HTN (hypertension) [I10] 03/04/2012  . GERD (gastroesophageal reflux disease) [K21.9] 03/04/2012  . Insomnia [G47.00] 03/04/2012  . CKD (chronic kidney disease) stage 2, GFR 60-89 ml/min [N18.2] 03/04/2012  . Schizophrenia, paranoid, chronic ([place]) [F20.0] 02/18/2012    Follow-up Information    Follow up with [facility] ACT Team.   Why:  Please contact your ACT Team at discharge to resume services.    Contact information:   [address] [place], [place] [address] Phone: [phone]  Plan Of Care/Follow-up recommendations:  Activity:  as tolerated Diet:  heart healthy  Is patient on multiple antipsychotic therapies at discharge:  Yes,   Do you recommend tapering to monotherapy for antipsychotics?  Yes   Has Patient had three or more  failed trials of antipsychotic monotherapy by history:  Yes  Recommended Plan for Multiple Antipsychotic Therapies: Patient's medications are in the process of a cross-taper;  medications include:  the Haldol should be decrease as he continues to tolerate respond to the [place] A 12/30/2015, 8:46 PM

## 2015-12-30 NOTE — Clinical Social Work Note (Signed)
CSW spoke with Diane with [facility] ACT Team [phone] ext [phone] who will transport him home around 2pm today.  CSW left voicemail for mother/guardian Melene Plan [phone] to inform her of patient's discharge home today. Awaiting return call.  Tilden Fossa, MSW, [place] Worker [facility] [phone]

## 2015-12-30 NOTE — Tx Team (Signed)
Interdisciplinary Treatment Plan Update (Adult)  Date:  12/30/15  Time Reviewed:  9:30 am  Progress in Treatment: Attending groups: Minimally. Participating in groups:  Minimally. Taking medication as prescribed:  Yes. Tolerating medication:  Yes. Family/Significant other contact made: Yes  ACT team and parents Patient understands diagnosis:  No  Limited insight Discussing patient identified problems/goals with staff:  Yes, see initial care plan. Medical problems stabilized or resolved:  Yes. Denies suicidal/homicidal ideation: Yes. Issues/concerns per patient self-inventory:  No. Other:  New problem(s) identified:  Discharge Plan or Barriers: see below  Reason for Continuation of Hospitalization: Medication stabilization Disorganization, Delusions  Comments:  Patient presents disorganized , has limited insight, is a poor historian. Collateral information was obtained from ACTT. Will continue medication readjustment. Will cross taper Zyprexa with Invega . Plan to DC pt on Invega sustenna IM - if he tolerates it well. Will not restart Haldol at this time. Will continue Cogentin 0.5 mg po qhs for EPS. Will continue Gabapentin 300 mg po bid for anxiety sx. Will continue Remeron 15 mg po qhs for affective sx as well as sleep.  12/08/15: Patient continues to present as disorganized ,delusional. Pt also has labile mood, requires a lot of redirection on the unit. Will continue medication readjustment.  Will increase Invega to 9 mg po qhs. Plan to DC pt on Invega sustenna IM - if he tolerates it well. Will provide Invega sustenna 234 mg IM tomorrow. Will not restart Haldol at this time. Will continue Cogentin 0.5 mg po qhs for EPS. Will continue Gabapentin 300 mg po bid for anxiety sx. Will continue Remeron 15 mg po qhs for affective sx as well as sleep.  12/13/15: Will continue Invega sustenna IM- 234 mg x 1 dose given 12/08/15, Invega sustenna 156 mg IM to be given on 12/12/15.  AIMS - 0 ( 12/12/15) Will discontinue Invega po . Will restart Haldol - start with a low dose of 5 mg po qhs for augmenting the effect of Invega sustenna . Will continue Cogentin 0.5 mg po qhs for EPS. Will continue Gabapentin 300 mg po bid for anxiety sx. Will continue Remeron 15 mg po qhs for affective sx as well as sleep.  12/15/15: Pt is currently restarted on Haldol , dose increased to 10 mg po qhs today for augmenting the effect of Invega. Pt on admission was on two antipsychotics- although he was not compliant.  Will continue Cogentin 0.5 mg po qhs for EPS. Will continue Gabapentin 300 mg po bid for anxiety sx. Will continue Remeron 15 mg po qhs for affective sx as well as sleep.  12/20/15:  No med changes since last treatment plan.  Estimated length of stay: Discharge anticipated for 12/30/15  New goal(s):  Review of initial/current patient goals per problem list:   Review of initial/current patient goals per problem list:  1. Goal(s): Patient will participate in aftercare plan   Met: Yes   Target date: 3-5 days post admission date   As evidenced by: Patient will participate within aftercare plan AEB aftercare provider and housing plan at discharge being identified.   12/06/15: Return home, follow up outpt  12/26: Referrals pending for [facility] placement but patient is not agreeable at this time. He wants to return home to follow up with outpatient services at discharge.  12/30: Goal met. Patient will return home to follow up with [facility] ACT team.   5. Goal(s): Patient will demonstrate decreased signs of psychosis  * Met: Adequate for discharge per  MD.  * Target date: 3-5 days post admission date  * As evidenced by: Patient will demonstrate decreased frequency of AVH or return to baseline function 12/06/15:  Bizarre, disorganized, poor historian 12/08/15:  Speech is now clearer, intelligible, and pt is more goal directed.  However, remains disorganized, lacking insight  and displaying mood lability.  Upset tha he cannot go home on pass to get his shoes 12/13/15:  Continues to be disorganized, tangential with flight of ideas 12/15/15:  Is now thinking more clearly, expressing self well.  Less mood lability. Tends to be grumpier in the AM 12/20/15:  Mood stable; delusions are lessening, and pt is able to carry on meaningful conversation for longer periods of time 12/26: Goal Progressing. 12/30: Adequate for discharge per MD. Patient appears to be at baseline level of psychosis and reports feeling safe to return home.     Attendees: Patient:    Family:    Physician: Dr. Parke Poisson, Dr. Sabra Heck, MD 12/30/15 9:30 AM   Nursing: Carma Lair RN 12/30/15 9:30 AM  CSW: Erasmo Downer Parilee Hally, LCSWA  12/30/15 9:30 AM  Other:    Other:    Other:    Other:    Other: Gillie Manners, NP 12/30/15 9:30 AM  Other:            Scribe for Treatment Team:   Tilden Fossa, MSW, [place] Worker [facility] [phone]

## 2015-12-30 NOTE — Progress Notes (Addendum)
  [facility] Adult Case Management Discharge Plan :  Will you be returning to the same living situation after discharge:  Yes,  patient plans to return home At discharge, do you have transportation home?: Yes,  ACT Team or mother will transport Do you have the ability to pay for your medications: Yes,  patient will be provided with prescriptions at discharge  Release of information consent forms completed and in the chart;  Patient's signature needed at discharge.  Patient to Follow up at: Follow-up Information    Follow up with [facility] ACT Team.   Why:  Please contact your ACT Team at discharge to resume services.    Contact information:   [address], [place], [place] [address] Phone: [phone]      Next level of care provider has access to [place] and Suicide Prevention discussed: SPE not required with this patient.   Have you used any form of tobacco in the last 30 days? (Cigarettes, Smokeless Tobacco, Cigars, and/or Pipes): Yes  Has patient been referred to the Quitline?: Patient refused referral  Patient has been referred for addiction treatment: Yes- outpatient  Treg Diemer, Casimiro Needle 12/30/2015, 8:28 AM

## 2016-01-10 ENCOUNTER — Emergency Department (HOSPITAL_COMMUNITY)
Admission: EM | Admit: 2016-01-10 | Discharge: 2016-01-18 | Disposition: A | Discharge status: Other Acute Inpatient Hospital | Payer: Medicare Other | Attending: Emergency Medicine | Admitting: Emergency Medicine

## 2016-01-10 ENCOUNTER — Encounter (HOSPITAL_COMMUNITY): Payer: Self-pay | Admitting: Emergency Medicine

## 2016-01-10 DIAGNOSIS — K219 Gastro-esophageal reflux disease without esophagitis: Secondary | ICD-10-CM | POA: Insufficient documentation

## 2016-01-10 DIAGNOSIS — F209 Schizophrenia, unspecified: Secondary | ICD-10-CM | POA: Diagnosis not present

## 2016-01-10 DIAGNOSIS — F200 Paranoid schizophrenia: Secondary | ICD-10-CM | POA: Diagnosis not present

## 2016-01-10 DIAGNOSIS — F419 Anxiety disorder, unspecified: Secondary | ICD-10-CM | POA: Diagnosis not present

## 2016-01-10 DIAGNOSIS — Z8701 Personal history of pneumonia (recurrent): Secondary | ICD-10-CM | POA: Diagnosis not present

## 2016-01-10 DIAGNOSIS — J449 Chronic obstructive pulmonary disease, unspecified: Secondary | ICD-10-CM | POA: Insufficient documentation

## 2016-01-10 DIAGNOSIS — F17210 Nicotine dependence, cigarettes, uncomplicated: Secondary | ICD-10-CM | POA: Insufficient documentation

## 2016-01-10 DIAGNOSIS — Z79899 Other long term (current) drug therapy: Secondary | ICD-10-CM | POA: Diagnosis not present

## 2016-01-10 DIAGNOSIS — Z046 Encounter for general psychiatric examination, requested by authority: Secondary | ICD-10-CM | POA: Diagnosis present

## 2016-01-10 LAB — BASIC METABOLIC PANEL
Anion gap: 9 (ref 5–15)
BUN: 13 mg/dL (ref 6–20)
CALCIUM: 8.9 mg/dL (ref 8.9–10.3)
CO2: 28 mmol/L (ref 22–32)
Chloride: 101 mmol/L (ref 101–111)
Creatinine, Ser: 1.26 mg/dL — ABNORMAL HIGH (ref 0.61–1.24)
GFR calc Af Amer: >60 mL/min (ref >60)
GLUCOSE: 90 mg/dL (ref 65–99)
Potassium: 4.4 mmol/L (ref 3.5–5.1)
Sodium: 138 mmol/L (ref 135–145)

## 2016-01-10 LAB — CBC WITH DIFFERENTIAL/PLATELET
BASOS PCT: 1 %
Basophils Absolute: 0.1 10*3/uL (ref 0.0–0.1)
EOS ABS: 1.7 10*3/uL — AB (ref 0.0–0.7)
Eosinophils Relative: 12 %
HCT: 43.9 % (ref 39.0–52.0)
HEMOGLOBIN: 14.6 g/dL (ref 13.0–17.0)
LYMPHS ABS: 1.9 10*3/uL (ref 0.7–4.0)
Lymphocytes Relative: 14 %
MCH: 31.6 pg (ref 26.0–34.0)
MCHC: 33.3 g/dL (ref 30.0–36.0)
MCV: 95 fL (ref 78.0–100.0)
MONO ABS: 1.1 10*3/uL — AB (ref 0.1–1.0)
MONOS PCT: 8 %
Neutro Abs: 9 10*3/uL — ABNORMAL HIGH (ref 1.7–7.7)
Neutrophils Relative %: 66 %
Platelets: 61 10*3/uL — ABNORMAL LOW (ref 150–400)
RBC: 4.62 MIL/uL (ref 4.22–5.81)
RDW: 14.4 % (ref 11.5–15.5)
WBC: 13.7 10*3/uL — ABNORMAL HIGH (ref 4.0–10.5)

## 2016-01-10 LAB — RAPID URINE DRUG SCREEN, HOSP PERFORMED
Amphetamines: NOT DETECTED
Barbiturates: NOT DETECTED
Benzodiazepines: NOT DETECTED
COCAINE: NOT DETECTED
OPIATES: NOT DETECTED
TETRAHYDROCANNABINOL: NOT DETECTED

## 2016-01-10 LAB — ETHANOL: Alcohol, Ethyl (B): <5 mg/dL (ref <5)

## 2016-01-10 MED ORDER — LIDOCAINE 5 % EX PTCH
1.0000 | MEDICATED_PATCH | CUTANEOUS | Status: DC
  Administered 2016-01-11 – 2016-01-17 (×5): 1 via TRANSDERMAL
  Filled 2016-01-10 (×10): qty 1

## 2016-01-10 MED ORDER — LOSARTAN POTASSIUM 50 MG PO TABS
ORAL_TABLET | ORAL | Status: AC
  Filled 2016-01-10: qty 1

## 2016-01-10 MED ORDER — GABAPENTIN 300 MG PO CAPS
600.0000 mg | ORAL_CAPSULE | Freq: Every day | ORAL | Status: DC
  Administered 2016-01-10 – 2016-01-17 (×8): 600 mg via ORAL
  Filled 2016-01-10 (×8): qty 2

## 2016-01-10 MED ORDER — BENZTROPINE MESYLATE 1 MG PO TABS
0.5000 mg | ORAL_TABLET | Freq: Every day | ORAL | Status: DC
  Administered 2016-01-10 – 2016-01-17 (×8): 0.5 mg via ORAL
  Filled 2016-01-10 (×8): qty 1

## 2016-01-10 MED ORDER — ALBUTEROL SULFATE HFA 108 (90 BASE) MCG/ACT IN AERS: 2.0000 | INHALATION_SPRAY | Freq: Four times a day (QID) | RESPIRATORY_TRACT | Status: DC | PRN

## 2016-01-10 MED ORDER — MIRTAZAPINE 30 MG PO TABS
ORAL_TABLET | ORAL | Status: AC
  Filled 2016-01-10: qty 1

## 2016-01-10 MED ORDER — PANTOPRAZOLE SODIUM 40 MG PO TBEC
40.0000 mg | DELAYED_RELEASE_TABLET | Freq: Every day | ORAL | Status: DC
  Administered 2016-01-10 – 2016-01-18 (×9): 40 mg via ORAL
  Filled 2016-01-10 (×9): qty 1

## 2016-01-10 MED ORDER — HALOPERIDOL 5 MG PO TABS
12.0000 mg | ORAL_TABLET | Freq: Every day | ORAL | Status: DC
  Administered 2016-01-10 – 2016-01-17 (×8): 12 mg via ORAL
  Filled 2016-01-10 (×10): qty 1

## 2016-01-10 MED ORDER — HYDROXYZINE HCL 25 MG PO TABS
25.0000 mg | ORAL_TABLET | Freq: Every evening | ORAL | Status: DC | PRN
  Administered 2016-01-10 – 2016-01-11 (×2): 25 mg via ORAL
  Filled 2016-01-10 (×2): qty 1

## 2016-01-10 MED ORDER — LIDOCAINE 5 % EX PTCH
MEDICATED_PATCH | CUTANEOUS | Status: AC
  Filled 2016-01-10: qty 1

## 2016-01-10 MED ORDER — HALOPERIDOL 5 MG PO TABS
ORAL_TABLET | ORAL | Status: AC
  Filled 2016-01-10: qty 2

## 2016-01-10 MED ORDER — BENZONATATE 100 MG PO CAPS
100.0000 mg | ORAL_CAPSULE | Freq: Three times a day (TID) | ORAL | Status: DC | PRN
  Administered 2016-01-10 – 2016-01-14 (×4): 100 mg via ORAL
  Filled 2016-01-10 (×5): qty 1

## 2016-01-10 MED ORDER — MIRTAZAPINE 15 MG PO TABS
15.0000 mg | ORAL_TABLET | Freq: Every day | ORAL | Status: DC
  Administered 2016-01-10 – 2016-01-17 (×8): 15 mg via ORAL
  Filled 2016-01-10 (×10): qty 1

## 2016-01-10 MED ORDER — LOSARTAN POTASSIUM 50 MG PO TABS
50.0000 mg | ORAL_TABLET | Freq: Every day | ORAL | Status: DC
  Administered 2016-01-10 – 2016-01-18 (×9): 50 mg via ORAL
  Filled 2016-01-10 (×11): qty 1

## 2016-01-10 MED ORDER — HALOPERIDOL 2 MG PO TABS
ORAL_TABLET | ORAL | Status: AC
  Filled 2016-01-10: qty 1

## 2016-01-10 MED ORDER — NICOTINE 21 MG/24HR TD PT24
21.0000 mg | MEDICATED_PATCH | Freq: Every day | TRANSDERMAL | Status: DC
  Administered 2016-01-10 – 2016-01-18 (×7): 21 mg via TRANSDERMAL
  Filled 2016-01-10 (×8): qty 1

## 2016-01-10 NOTE — Progress Notes (Addendum)
Patient has been referred to the following facilities: [facility] per Gari Crown, beds open, fax referral. [facility] - per Dub Mikes, beds open, fax it. [facility] - per intake, fax referral fort waitlist. [facility] - per [place], fax referral for d/c's tomorrow, no beds tonight. [facility] - per Gerald Stabs, fax referral for review. [facility] - per intake, fax referral. [facility] - per Jeannene Patella, fax referral, 1 male/male bed open.  Declined at: [facility] - per [place], due chronicity  At capacity: [facility] - per Jenny Reichmann, no adult beds, only child and adolescent beds open [facility] - per Fraser Din  CSW will continue to seek placement.  Verlon Setting, [place] Disposition staff 01/10/2016 8:06 PM

## 2016-01-10 NOTE — ED Provider Notes (Signed)
CSN: [id number]     Arrival date & time 01/10/16  1440 History   First MD Initiated Contact with Patient 01/10/16 1508     Chief Complaint  Patient presents with  . V70.1      HPI Pt was seen at 1515. Per Police, pt and IVC paperwork: Pt brought to ED by Police under IVC for "behaving very odd as of late." Pt has been "talking about building a monorail from here to [place] one minute then talks normal the next." Pt states he was discharged from [facility] last month "where they changed my medications" and he "feels they're not working." Currently denies hallucinations, no SI, no HI.    Past Medical History  Diagnosis Date  . Schizophrenia ([place])   . Pneumonia   . Anxiety   . GERD (gastroesophageal reflux disease)   . COPD (chronic obstructive pulmonary disease) [facility])    Past Surgical History  Procedure Laterality Date  . Tonsillectomy     Family History  Problem Relation Age of Onset  . Arthritis Mother   . Hypertension Mother    Social History  Substance Use Topics  . Smoking status: Current Every Day Smoker -- 1.00 packs/day for 40 years    Types: Cigarettes    Start date: 04/14/1974  . Smokeless tobacco: Never Used  . Alcohol Use: No    Review of Systems  ROS: Statement: All systems negative except as marked or noted in the HPI; Constitutional: Negative for fever and chills. ; ; Eyes: Negative for eye pain, redness and discharge. ; ; ENMT: Negative for ear pain, hoarseness, nasal congestion, sinus pressure and sore throat. ; ; Cardiovascular: Negative for chest pain, palpitations, diaphoresis, dyspnea and peripheral edema. ; ; Respiratory: Negative for cough, wheezing and stridor. ; ; Gastrointestinal: Negative for nausea, vomiting, diarrhea, abdominal pain, blood in stool, hematemesis, jaundice and rectal bleeding. . ; ; Genitourinary: Negative for dysuria, flank pain and hematuria. ; ; Musculoskeletal: Negative for back pain and neck pain. Negative for swelling and trauma.; ;  Skin: Negative for pruritus, rash, abrasions, blisters, bruising and skin lesion.; ; Neuro: Negative for headache, lightheadedness and neck stiffness. Negative for weakness, altered level of consciousness , altered mental status, extremity weakness, paresthesias, involuntary movement, seizure and syncope. ; Psych:  No SI, no SA, no HI.     Allergies  Lithium; Trazodone and nefazodone; and Trazodone  Home Medications   Prior to Admission medications   Medication Sig Start Date End Date Taking? Authorizing Provider  albuterol (PROVENTIL HFA;VENTOLIN HFA) 108 (90 Base) MCG/ACT inhaler Inhale 2 puffs into the lungs every 6 (six) hours as needed for shortness of breath. 12/30/15   Benjamine Mola, FNP  benztropine (COGENTIN) 0.5 MG tablet Take 1 tablet (0.5 mg total) by mouth at bedtime. 12/30/15   Benjamine Mola, FNP  gabapentin (NEURONTIN) 300 MG capsule Take 2 capsules (600 mg total) by mouth at bedtime. 12/30/15   Benjamine Mola, FNP  haloperidol (HALDOL) 2 MG tablet Take 6 tablets (12 mg total) by mouth at bedtime. 12/30/15   Benjamine Mola, FNP  hydrOXYzine (ATARAX/VISTARIL) 25 MG tablet Take 1 tablet (25 mg total) by mouth at bedtime as needed (insomnia). 12/30/15   Benjamine Mola, FNP  lidocaine (LIDODERM) 5 % Place 1 patch onto the skin daily. Remove & Discard patch within 12 hours or as directed by MD 12/30/15   Benjamine Mola, FNP  losartan (COZAAR) 50 MG tablet Take 1 tablet (50 mg  total) by mouth daily. 12/30/15   Benjamine Mola, FNP  mirtazapine (REMERON) 15 MG tablet Take 1 tablet (15 mg total) by mouth at bedtime. 12/30/15   Benjamine Mola, FNP  nicotine (NICODERM CQ - DOSED IN MG/24 HOURS) 21 mg/24hr patch Place 1 patch (21 mg total) onto the skin daily. 12/30/15   Benjamine Mola, FNP  omeprazole (PRILOSEC) 20 MG capsule Take 1 capsule (20 mg total) by mouth daily. 12/30/15   Benjamine Mola, FNP  paliperidone (INVEGA SUSTENNA) 156 MG/ML SUSP injection Inject 1 mL (156 mg total) into  the muscle every 28 (twenty-eight) days. TO BE GIVEN AT CLINIC 01/10/16   Elyse Jarvis Withrow, FNP   BP 149/80 mmHg  Pulse 86  Temp(Src) 98.1 F (36.7 C) (Oral)  Resp 16  Ht 6' (1.829 m)  Wt 210 lb (95.255 kg)  BMI 28.47 kg/m2  SpO2 100% Physical Exam  1520: Physical examination:  Nursing notes reviewed; Vital signs and O2 SAT reviewed;  Constitutional: Well developed, Well nourished, Well hydrated, In no acute distress; Head:  Normocephalic, atraumatic; Eyes: EOMI, PERRL, No scleral icterus; ENMT: Mouth and pharynx normal, Mucous membranes moist; Neck: Supple, Full range of motion; Cardiovascular: Regular rate and rhythm; Respiratory: Breath sounds clear, No wheezes.  Speaking full sentences with ease, Normal respiratory effort/excursion; Chest: No deformity, Movement normal; Abdomen: Nondistended; Extremities: No deformity.; Neuro: AA&Ox3, Major CN grossly intact.  Speech clear. No gross focal motor deficits in extremities. Climbs on and off stretcher easily by himself. Gait steady.; Skin: Color normal, Warm, Dry.; Psych:  Affect flat.    ED Course  Procedures (including critical care time) Labs Review  Imaging Review  I have personally reviewed and evaluated these images and lab results as part of my medical decision-making.   EKG Interpretation None      MDM  MDM Reviewed: previous chart, nursing note and vitals Reviewed previous: labs Interpretation: labs      Results for orders placed or performed during the hospital encounter of 123456  Basic metabolic panel  Result Value Ref Range   Sodium 138 135 - 145 mmol/L   Potassium 4.4 3.5 - 5.1 mmol/L   Chloride 101 101 - 111 mmol/L   CO2 28 22 - 32 mmol/L   Glucose, Bld 90 65 - 99 mg/dL   BUN 13 6 - 20 mg/dL   Creatinine, Ser 1.26 (H) 0.61 - 1.24 mg/dL   Calcium 8.9 8.9 - 10.3 mg/dL   GFR calc non Af Amer >60 >60 mL/min   GFR calc Af Amer >60 >60 mL/min   Anion gap 9 5 - 15  Ethanol  Result Value Ref Range   Alcohol,  Ethyl (B) <5 <5 mg/dL  CBC with Differential  Result Value Ref Range   WBC 13.7 (H) 4.0 - 10.5 K/uL   RBC 4.62 4.22 - 5.81 MIL/uL   Hemoglobin 14.6 13.0 - 17.0 g/dL   HCT 43.9 39.0 - 52.0 %   MCV 95.0 78.0 - 100.0 fL   MCH 31.6 26.0 - 34.0 pg   MCHC 33.3 30.0 - 36.0 g/dL   RDW 14.4 11.5 - 15.5 %   Platelets 61 (L) 150 - 400 K/uL   Neutrophils Relative % 66 %   Neutro Abs 9.0 (H) 1.7 - 7.7 K/uL   Lymphocytes Relative 14 %   Lymphs Abs 1.9 0.7 - 4.0 K/uL   Monocytes Relative 8 %   Monocytes Absolute 1.1 (H) 0.1 - 1.0 K/uL  Eosinophils Relative 12 %   Eosinophils Absolute 1.7 (H) 0.0 - 0.7 K/uL   Basophils Relative 1 %   Basophils Absolute 0.1 0.0 - 0.1 K/uL  Urine rapid drug screen (hosp performed)  Result Value Ref Range   Opiates NONE DETECTED NONE DETECTED   Cocaine NONE DETECTED NONE DETECTED   Benzodiazepines NONE DETECTED NONE DETECTED   Amphetamines NONE DETECTED NONE DETECTED   Tetrahydrocannabinol NONE DETECTED NONE DETECTED   Barbiturates NONE DETECTED NONE DETECTED    1650:  TTS eval pending.  1800:  TTS eval completed: [facility] placement pending. Holding orders written.   Francine Graven, DO 01/10/16 2340
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
## 2016-01-10 NOTE — ED Notes (Signed)
Not all 1815 meds are in pyxis. AC made aware and waiting for pharmacy approval of meds.

## 2016-01-10 NOTE — ED Notes (Signed)
Pt brought in by [employer] under IVC after papers taken out by [facility].  Pt has been behaving oddly and appears to be delusional per IVC papers.

## 2016-01-10 NOTE — ED Notes (Signed)
PT IVCed and sheriff department sitting with pt at this time. Counselor called and will fax his MAR to facility.

## 2016-01-11 DIAGNOSIS — F209 Schizophrenia, unspecified: Secondary | ICD-10-CM | POA: Diagnosis not present

## 2016-01-11 MED ORDER — ACETAMINOPHEN 325 MG PO TABS
650.0000 mg | ORAL_TABLET | ORAL | Status: DC | PRN
  Administered 2016-01-11 – 2016-01-18 (×6): 650 mg via ORAL
  Filled 2016-01-11 (×6): qty 2

## 2016-01-11 MED ORDER — ALUM & MAG HYDROXIDE-SIMETH 200-200-20 MG/5ML PO SUSP: 30.0000 mL | ORAL | Status: DC | PRN

## 2016-01-11 MED ORDER — NICOTINE 21 MG/24HR TD PT24
21.0000 mg | MEDICATED_PATCH | Freq: Every day | TRANSDERMAL | Status: DC | PRN
  Administered 2016-01-14: 21 mg via TRANSDERMAL
  Filled 2016-01-11: qty 1

## 2016-01-11 NOTE — Progress Notes (Addendum)
Patient on the wait list at [facility].  [place] referral form completed and faxed to [employer] in efforts to obtain an authorization #.  Referral was followed up at the following facilities: [facility] - per [facility], "I don't have the referral right in front of me, follow up later". Per Caren Griffins, no bed available at this time, referral to be considered tomorrow. [facility] - left [employer] - left voicemail [place] - per [employer], regular intake gone.  Declined: [facility] - due environmental milieu  [facility]- due to chronicity [employer]- per Roselyn Reef, due to pt being out of lifetime Medicare inpatient days and facility unable to accept MCD as payor as they do not have MCD contract (MCD is pt's secondary coverage)  Verlon Setting, [place] Disposition staff 01/11/2016 6:00 PM

## 2016-01-11 NOTE — Progress Notes (Addendum)
Followed up on inpatient placement efforts:  Under review: [facility]- per Margreta Journey  Referred to: [facility]- per Kemper Durie- per Gerald Stabs  On waiting list: [facility]- per [facility]  Declined: [facility]- due to chronicity [employer]- per Roselyn Reef, due to pt being out of lifetime Medicare inpatient days and facility unable to accept MCD as payor as they do not have MCD contract (MCD is pt's secondary coverage)  Sharren Bridge, MSW, [place] Work, Disposition  01/11/2016 [phone]

## 2016-01-11 NOTE — ED Notes (Signed)
Pt states he wants to go home.  Advised pt that he was IVC'd and can not leave.   Pt upset.  Sitter at bedside.

## 2016-01-11 NOTE — ED Notes (Addendum)
Pt requesting ativan and Tylenol for a headache.   Notified Dr. Thurnell Garbe.   Orders received.

## 2016-01-12 DIAGNOSIS — F209 Schizophrenia, unspecified: Secondary | ICD-10-CM | POA: Diagnosis not present

## 2016-01-12 MED ORDER — HALOPERIDOL 5 MG PO TABS
ORAL_TABLET | ORAL | Status: AC
  Filled 2016-01-12: qty 2

## 2016-01-12 MED ORDER — HALOPERIDOL 2 MG PO TABS
ORAL_TABLET | ORAL | Status: AC
  Filled 2016-01-12: qty 1

## 2016-01-12 MED ORDER — LIDOCAINE 5 % EX PTCH
MEDICATED_PATCH | CUTANEOUS | Status: AC
  Filled 2016-01-12: qty 1

## 2016-01-12 MED ORDER — LORAZEPAM 1 MG PO TABS
1.0000 mg | ORAL_TABLET | Freq: Four times a day (QID) | ORAL | Status: DC | PRN
  Administered 2016-01-12 – 2016-01-18 (×12): 1 mg via ORAL
  Filled 2016-01-12 (×12): qty 1

## 2016-01-12 NOTE — ED Notes (Addendum)
Pt stating that he wants to exercise and when we build this new hospital we need to put some exercise rooms in it. Also requesting to by called "David [name]" states it is his god given name and we need to get his name right.

## 2016-01-12 NOTE — Progress Notes (Signed)
Faxed pt's IVC paperwork ED MD notes to include in pt's referral. Pt is on [facility] waiting list per [facility].  Sharren Bridge, MSW, LCSW Clinical Social Work, Disposition  01/12/2016 [phone]

## 2016-01-12 NOTE — ED Notes (Signed)
Pt agitated, EDP notified.

## 2016-01-12 NOTE — ED Notes (Signed)
Pt stating " he is ready to get out of here, he is tired of this shit". He states "I know my mom and Susa Griffins from mental health put me here", and "I am going leave her soon".

## 2016-01-12 NOTE — Progress Notes (Signed)
Pt referred to [facility] with authorization # [id number] - from [facility] ([facility])  Gave verbal referral to [facility] intake and was advised fax referral for review.  Sharren Bridge, MSW, LCSW Clinical Social Work, Disposition  01/12/2016 [phone]

## 2016-01-13 DIAGNOSIS — F209 Schizophrenia, unspecified: Secondary | ICD-10-CM | POA: Diagnosis not present

## 2016-01-13 NOTE — Progress Notes (Addendum)
Pt remains on [facility] waiting list per [facility]. Remains on [facility] waiting list per [place].  Pt under review at [facility].  Sharren Bridge, MSW, LCSW Clinical Social Work, Disposition  01/13/2016 [phone]

## 2016-01-13 NOTE — ED Notes (Signed)
RN spoke with pt mother (legal guardian), updated on pt referrals.

## 2016-01-14 DIAGNOSIS — F209 Schizophrenia, unspecified: Secondary | ICD-10-CM | POA: Diagnosis not present

## 2016-01-14 NOTE — ED Notes (Signed)
Pt upset because he cannot have a pencil or ink pen. States he wants to write a letter to [place]. Pt informed he could not have any sharp objects but he could have a crayon. Pt stated " just get out of my room and eave me alone. "

## 2016-01-14 NOTE — ED Notes (Signed)
Lidoerm patch reapplied to upper back at this time

## 2016-01-14 NOTE — ED Notes (Signed)
Patient is resting comfortably. 

## 2016-01-14 NOTE — Progress Notes (Addendum)
Pt remains on [facility] waiting list per Richardson Landry. Remains on [facility] waiting list per [employer]. She states she will call back later this morning when bed status for the day is known. Under review at [employer] per [place].   Sharren Bridge, MSW, LCSW Clinical Social Work, Disposition  01/14/2016 [phone]

## 2016-01-15 DIAGNOSIS — F209 Schizophrenia, unspecified: Secondary | ICD-10-CM | POA: Diagnosis not present

## 2016-01-15 MED ORDER — LIDOCAINE 5 % EX PTCH
MEDICATED_PATCH | CUTANEOUS | Status: AC
  Filled 2016-01-15: qty 1

## 2016-01-15 NOTE — ED Notes (Signed)
Pt sleeping soundly at this time.

## 2016-01-15 NOTE — ED Notes (Signed)
Pt denies any complaints.  Is calm and cooperative.

## 2016-01-15 NOTE — ED Notes (Signed)
Pt states he wants a 4 mg of Haldol. Pt made aware his Haldol is ordered for bedtime. States he takes 12 mg and he wants 4 mg now.

## 2016-01-16 DIAGNOSIS — F209 Schizophrenia, unspecified: Secondary | ICD-10-CM | POA: Diagnosis not present

## 2016-01-16 NOTE — Progress Notes (Signed)
Referral has been followed up at: [facility] - per Lelon Frohlich, patient is on the waitlist, voicemail has been left on 1/15 with potential bed however bed not available on 1/16. [facility] - per Verdene Lennert, still on the wait list.   Declined at: [facility] - per Lovell Sheehan, due medical [place] - per [place], due chronicity [facility] - due environmental milieu  [facility]- per Roselyn Reef, due to pt being out of lifetime Medicare inpatient days and facility unable   CSW will continue to seek placement.   Verlon Setting, [place] Disposition staff 01/16/2016 6:22 PM

## 2016-01-17 DIAGNOSIS — F209 Schizophrenia, unspecified: Secondary | ICD-10-CM | POA: Insufficient documentation

## 2016-01-17 NOTE — ED Notes (Signed)
Pt ambulated to bathroom with sitter. Gait is steady and even. Pt has no further requests at this time.

## 2016-01-17 NOTE — Progress Notes (Signed)
Pt remains on [facility] waiting list per Ron and on [facility] [employer] per [place].   Sharren Bridge, MSW, LCSW Clinical Social Work, Disposition  01/17/2016 [phone]

## 2016-01-17 NOTE — Progress Notes (Addendum)
Jenee at [facility] [phone]) inquired if patient has housing, an ACT Team/outside provider, any current psychosis, and a guardian or family to report what happened with patient.  Per [facility] RN Hendry, patient lives with his mom, his ACT Team with [facility] has just called today but no contact info available.  Per RN Tiffany, patient still has psychosis, pt has been calm and pleasant today, taking meds, hasn't voiced any internal stimula with this RN.  Pt's mom is his legal guardian.  This Probation officer attempted to contact mom but no answer and no option for voicemail.  Writer contacted [place] at [facility] and informed of obtained info.  Verlon Setting, [place] Disposition staff 01/17/2016 4:07 PM

## 2016-01-17 NOTE — Consult Note (Signed)
Telepsych Consultation   Reason for Consult:  Psychosis Referring Physician:  Forestine Na EDP Patient Identification: David [name] MRN:  [id number] Principal Diagnosis: <principal problem not specified> Diagnosis:   Patient Active Problem List   Diagnosis Date Noted  . 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 History of idiopathic thrombocytopenic purpura [Z86.2] 12/05/2015  . Paranoid schizophrenia ([place]) [F20.0] 12/03/2015  . HTN (hypertension) [I10] 03/04/2012  . GERD (gastroesophageal reflux disease) [K21.9] 03/04/2012  . Insomnia [G47.00] 03/04/2012  . CKD (chronic kidney disease) stage 2, GFR 60-89 ml/min [N18.2] 03/04/2012  . 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 male patient admitted under IVC initiated by his mother due to increased aggressive behaviors. The patient has a history of chronic schizophrenia with poor insight into his illness. Patient states "I just needed my ativan. It keeps me calm. Call Dr. Shea Evans to get that for me. I do not need any mind altering drugs like Invega. I will not take that. My family thinks I'm nuts because I work for myself in [employer]. I talked to Dr. Shea Evans about expanding [facility] and [place]. But if people don't want me to work on it I won't. I guess that is your problem. I need some ativan now because I am getting agitated."   HPI:    David [name] is an 60 y.o. male who presents under IVC to [facility] who has a long history of schizophrenia. He was recently discharged on 12/03/2015 after a long stay on the adult unit. Patient was supposed to get his next injection of Invega sustenna on 01/10/2016 but did not receive due to poor insight into his illness. The patient has been placed on the wait list for [facility] due to his severe and persistent mental illness. During assessment today patient was fixated only on taking his ativan and seeing if writer could  get that ordered for him. He became upset when the Lorayne Bender was mentioned expressing disdain for "mind-altering medications." Patient talked at length about needing to expand certain health centers in [place]. He appeared upset that nobody understood the need for him to do this. Patient requested ativan for agitation for assessment as it appeared answering the questions aggravated him. David [name] appears to have continued mood lability and psychotic symptoms in the form of delusions. He denies any suicidal or homicidal ideation. Denies auditory hallucinations and does not appear to be responding to internal stimuli. Patient appears to function poorly outside of the hospital setting and continues to live alone at home near his mother.   HPI Elements:   Location:  psychosis, agitation . Quality:  Delusions, was aggressive per mother prior to admission. Severity:  Severe. Timing:  Last few weeks. Duration:  Chronic with long inpatient stay at [facility] in the past . Context:  Poor insight, not fully compliant with treatment.  Past Medical History:  Past Medical History  Diagnosis Date  . Schizophrenia ([place])   . Pneumonia   . Anxiety   . GERD (gastroesophageal reflux disease)   . COPD (chronic obstructive pulmonary disease) [facility])     Past Surgical History  Procedure Laterality Date  . Tonsillectomy     Family History:  Family History  Problem Relation Age of Onset  . Arthritis Mother   . Hypertension Mother    Social History:  History  Alcohol Use No     History  Drug Use No  Comment: denies    Social History   Social History  . Marital Status: Legally Separated    Spouse Name: N/A  . Number of Children: N/A  . Years of Education: N/A   Social History Main Topics  . Smoking status: Current Every Day Smoker -- 1.00 packs/day for 40 years    Types: Cigarettes    Start date: 04/14/1974  . Smokeless tobacco: Never Used  . Alcohol Use: No  . Drug Use: No     Comment: denies  .  Sexual Activity: No   Other Topics Concern  . None   Social History Narrative   Additional Social History:    Pain Medications: see MAR Prescriptions: see MAR Over the Counter: see MAR History of alcohol / drug use?: Yes Longest period of sobriety (when/how long): Unknown Name of Substance 1: Alcohol (info rec'd from legal guardian, pts mother) 1 - Age of First Use: teenager 1 - Amount (size/oz): unknown 1 - Frequency: not often, but mom believes that pts drinking exacerbates his delusions 1 - Duration: occasional 1 - Last Use / Amount: unknown Name of Substance 2: Weed  (info rec'd from legal guardian, pts mother) 2 - Age of First Use: teen 2 - Amount (size/oz): unknown 2 - Frequency: not often, but mom believes that pts use exacerbates his delusions 2 - Duration: occasional 2 - Last Use / Amount: unknown                 Allergies:   Allergies  Allergen Reactions  . Lithium Nausea Only, Nausea And Vomiting and Other (See Comments)    "get's very sick"  . Trazodone And Nefazodone Nausea And Vomiting  . Trazodone Nausea And Vomiting    Labs: No results found for this or any previous visit (from the past 48 hour(s)).  Vitals: Blood pressure 121/59, pulse 88, temperature 98.5 F (36.9 C), temperature source Oral, resp. rate 17, height 6' (1.829 m), weight 95.255 kg (210 lb), SpO2 98 %.  Risk to Self: Suicidal Ideation: No Suicidal Intent: No Is patient at risk for suicide?: No Suicidal Plan?: No Access to Means: No What has been your use of drugs/alcohol within the last 12 months?: see above Intentional Self Injurious Behavior: None Risk to Others: Homicidal Ideation: No Thoughts of Harm to Others: No-Not Currently Present/Within Last 6 Months Comment - Thoughts of Harm to Others: when mad, pt has threatened to harm others Current Homicidal Intent: No Current Homicidal Plan: No Access to Homicidal Means: No History of harm to others?: No Assessment of  Violence: None Noted Does patient have access to weapons?: No Criminal Charges Pending?: No Does patient have a court date: No Prior Inpatient Therapy: Prior Inpatient Therapy: Yes Prior Therapy Dates: 2013; 2015; 2016 Prior Therapy Facilty/Provider(s): [facility]; [place] Reason for Treatment: Schizophrenia Prior Outpatient Therapy: Prior Outpatient Therapy: Yes Prior Therapy Dates: current Prior Therapy Facilty/Provider(s): [facility] Reason for Treatment: schizophrenia Does patient have an ACCT team?: Yes ([facility] in [employer]; Danae Chen & Diane) Does patient have Intensive In-House Services?  : No Does patient have P4CC services?: No  Current Facility-Administered Medications  Medication Dose Route Frequency Provider Last Rate Last Dose  . acetaminophen (TYLENOL) tablet 650 mg  650 mg Oral Q4H PRN Francine Graven, DO   650 mg at 01/16/16 0440  . albuterol (PROVENTIL HFA;VENTOLIN HFA) 108 (90 Base) MCG/ACT inhaler 2 puff  2 puff Inhalation Q6H PRN Francine Graven, DO      . alum & mag hydroxide-simeth (MAALOX/MYLANTA)  200-200-20 MG/5ML suspension 30 mL  30 mL Oral PRN Francine Graven, DO      . benzonatate (TESSALON) capsule 100 mg  100 mg Oral TID PRN Francine Graven, DO   100 mg at 01/14/16 1754  . benztropine (COGENTIN) tablet 0.5 mg  0.5 mg Oral QHS Francine Graven, DO   0.5 mg at 01/16/16 2204  . gabapentin (NEURONTIN) capsule 600 mg  600 mg Oral QHS Francine Graven, DO   600 mg at 01/16/16 2203  . haloperidol (HALDOL) tablet 12 mg  12 mg Oral QHS Francine Graven, DO   12 mg at 01/16/16 2203  . hydrOXYzine (ATARAX/VISTARIL) tablet 25 mg  25 mg Oral QHS PRN Francine Graven, DO   25 mg at 01/11/16 1950  . lidocaine (LIDODERM) 5 % 1 patch  1 patch Transdermal Q24H Francine Graven, DO   1 patch at 01/14/16 2108  . LORazepam (ATIVAN) tablet 1 mg  1 mg Oral Q6H PRN Fredia Sorrow, MD   1 mg at 01/16/16 2203  . losartan (COZAAR) tablet 50 mg  50 mg Oral Daily Francine Graven, DO   50 mg at  01/17/16 0934  . mirtazapine (REMERON) tablet 15 mg  15 mg Oral QHS Francine Graven, DO   15 mg at 01/16/16 2204  . nicotine (NICODERM CQ - dosed in mg/24 hours) patch 21 mg  21 mg Transdermal Daily Francine Graven, DO   21 mg at 01/17/16 0934  . nicotine (NICODERM CQ - dosed in mg/24 hours) patch 21 mg  21 mg Transdermal Daily PRN Francine Graven, DO   21 mg at 01/14/16 0940  . pantoprazole (PROTONIX) EC tablet 40 mg  40 mg Oral Daily Francine Graven, DO   40 mg at 01/17/16 [id number]   Current Outpatient Prescriptions  Medication Sig Dispense Refill  . albuterol (PROVENTIL HFA;VENTOLIN HFA) 108 (90 Base) MCG/ACT inhaler Inhale 2 puffs into the lungs every 6 (six) hours as needed for shortness of breath. 1 Inhaler 11  . benztropine (COGENTIN) 0.5 MG tablet Take 1 tablet (0.5 mg total) by mouth at bedtime. 30 tablet 0  . gabapentin (NEURONTIN) 300 MG capsule Take 2 capsules (600 mg total) by mouth at bedtime. 60 capsule 0  . haloperidol (HALDOL) 2 MG tablet Take 6 tablets (12 mg total) by mouth at bedtime. 180 tablet 0  . hydrOXYzine (ATARAX/VISTARIL) 25 MG tablet Take 1 tablet (25 mg total) by mouth at bedtime as needed (insomnia). 30 tablet 0  . lidocaine (LIDODERM) 5 % Place 1 patch onto the skin daily. Remove & Discard patch within 12 hours or as directed by MD 30 patch 0  . losartan (COZAAR) 50 MG tablet Take 1 tablet (50 mg total) by mouth daily. 30 tablet 5  . mirtazapine (REMERON) 15 MG tablet Take 1 tablet (15 mg total) by mouth at bedtime. 30 tablet 0  . nicotine (NICODERM CQ - DOSED IN MG/24 HOURS) 21 mg/24hr patch Place 1 patch (21 mg total) onto the skin daily. 28 patch 0  . omeprazole (PRILOSEC) 20 MG capsule Take 1 capsule (20 mg total) by mouth daily. 30 capsule 0  . paliperidone (INVEGA SUSTENNA) 156 MG/ML SUSP injection Inject 1 mL (156 mg total) into the muscle every 28 (twenty-eight) days. TO BE GIVEN AT CLINIC 0.9 mL 0  . [DISCONTINUED] zolpidem (AMBIEN) 5 MG tablet Take 5 mg by  mouth at bedtime as needed.      Musculoskeletal: Strength & Muscle Tone: within normal limits Gait & Station: normal  Patient leans: N/A  Psychiatric Specialty Exam: Physical Exam  ROS  Blood pressure 121/59, pulse 88, temperature 98.5 F (36.9 C), temperature source Oral, resp. rate 17, height 6' (1.829 m), weight 95.255 kg (210 lb), SpO2 98 %.Body mass index is 28.47 kg/(m^2).  General Appearance: Disheveled  Eye Contact::  Good  Speech:  Clear and Coherent  Volume:  Varied, increased when agitated   Mood:  Angry and Anxious  Affect:  Labile  Thought Process:  Circumstantial  Orientation:  Full (Time, Place, and Person)  Thought Content:  Delusions and Rumination  Suicidal Thoughts:  No  Homicidal Thoughts:  No  Memory:  Immediate;   Fair Recent;   Poor Remote;   Poor  Judgement:  Impaired  Insight:  Lacking  Psychomotor Activity:  Normal  Concentration:  Fair  Recall:  [employer] of Knowledge:Fair  Language: Good  Akathisia:  No  Handed:  Right  AIMS (if indicated):     Assets:  Communication Skills Desire for Improvement Physical Health Resilience Social Support  ADL's:  Intact  Cognition: WNL  Sleep:      Medical Decision Making: Review of Psycho-Social Stressors (1), Established Problem, Worsening (2) and Review of Medication Regimen & Side Effects (2)  Plan:  Recommend psychiatric Inpatient admission when medically cleared. Supportive therapy provided about ongoing stressors. Disposition: Continue to seek placement at [facility], [place], NP-C 01/17/2016 10:30 AM

## 2016-01-18 DIAGNOSIS — F209 Schizophrenia, unspecified: Secondary | ICD-10-CM | POA: Diagnosis not present

## 2016-01-18 DIAGNOSIS — F200 Paranoid schizophrenia: Secondary | ICD-10-CM

## 2016-01-18 MED ORDER — HALOPERIDOL DECANOATE 100 MG/ML IM SOLN: 100.0000 mg | Freq: Once | INTRAMUSCULAR | Status: DC

## 2016-01-18 MED ORDER — HALOPERIDOL DECANOATE 100 MG/ML IM SOLN
100.0000 mg | INTRAMUSCULAR | Status: DC
  Filled 2016-01-18: qty 1

## 2016-01-18 NOTE — ED Notes (Signed)
TTS consult completed 

## 2016-01-18 NOTE — ED Notes (Signed)
TTS consult in progress at this time. 

## 2016-01-18 NOTE — ED Notes (Signed)
Paperwork for rescinding IVC given to Network engineer to fax.

## 2016-01-18 NOTE — Progress Notes (Signed)
Pt remains on waiting list at [facility] and on [facility] waiting list as pt is reportedly "out of lifetime Medicare days."  Also re-referred to [facility], per [place] with intake.   Sharren Bridge, MSW, LCSW Clinical Social Work, Disposition  01/18/2016 [phone]

## 2016-01-18 NOTE — Progress Notes (Signed)
Spoke with Pt's ACTT representative Diane at [phone]. She corroborates pt's mother's report that prior to ED arrival pt had been refusing his injectable medications. She has concerns he will continue to do this upon d/c, therefore unsure if Haldol shot will be administered today. She states pt has been calling her from ED asking to speak with her about "the bridge he is building," "apparently still having some delusions." She expresses concern about this as "he was much more stable than this when he first arrived home from the hospital [referencing d/c from [facility] [id number], we are not certain that this is his baseline at this point." CSW relayed information documented in today's telepsych reassessment (see chart) recommending that pt seems stable for d/c with close follow-up with ACTT. She requests that list of medication (including today's medication recommendation by psychiatry) be included with pt's d/c instructions to assist ACTT in pt's aftercare.   States ACTT and mother work closely to collaborate re: pt's care. Requests she [phone]) and mother [phone]) be called to pick pt up when ready for d/c. Agrees to assist pt in accessing further crisis services if needed.  Sharren Bridge, MSW, LCSW Clinical Social Work, Disposition  01/18/2016 [phone]

## 2016-01-18 NOTE — ED Notes (Signed)
Spoke with Heloise Purpura, NP at [facility]. Haldol injection ordered is not available at our facility. NP states that he will look into other options and consult back with [place].

## 2016-01-18 NOTE — ED Notes (Signed)
TTS machine placed at bedside.   

## 2016-01-18 NOTE — ED Notes (Signed)
ACT team representative Doroteo Bradford is transporting pt instead of Diane.

## 2016-01-18 NOTE — Progress Notes (Signed)
Spoke with pt's mother (also legal guardian) Lanice Schwab [phone]. She states pt is still served by [facility] and that they had been visiting daily prior to pt's IVC and ED arrival 01/10/16. States pt had been refusing his injectable medication Lorayne Bender Sustenna per records) since his d/c from [facility] 12/30/15. States ACTT and she believed he had been compliant with PO medications. States pt steadily decompensated in the 2 weeks he was home (lives alone with frequent visits from family and ACTT), calling 911 multiple times per day, believing he was mayor, that he was building a monorail for the town. States he had endorsed drinking and smoking THC to her and his brother, which she states, "[employer] had told him he should not do b/c of his mental health hx and being on psychotropic meds." (Pt's BAL and UDS were clear upon arrival to ED.)  Mother/guardian states pt was stable for about 7 months in 2016- from d/c from [facility] in the spring till fall 2016. States he has been behaving bizarrely and exhibiting delusion thought since then, first leading to [facility] admission (12/03/15- 12/30/15), then again prompting her to initiate IVC 01/09/15- leading to current ED admission. Mother states she has noticed when speaking on phone yesterday and today that pt seems more stable in ED (notes he has been taking medications for past few days) while continuing to express her concern that pt has quickly decompensated once no longer in structured environment.   ROI for [facility] on file from mother/guardian- she expressed consent again for [facility] to contact them re: pt's case. CSW left voicemail for ACTT at [phone] ex#[phone].  Per Psych re-eval this morning, medication change (Haldol IM) and d/c to return to outpatient care of ACTT is recommended. Awaiting returned call from ACTT re: medication change and follow up.   After speaking with psych team, left voicemail for mother to inform her of change in disposition  recommendation as well to as to seek assistance in facilitating pt's follow-up with ACTT.  Sharren Bridge, MSW, LCSW Clinical Social Work, Disposition  01/18/2016 [phone]

## 2016-01-18 NOTE — ED Notes (Signed)
PT made aware that

## 2016-01-18 NOTE — ED Notes (Signed)
Pt given lunch meal tray. Pt eating at this time.

## 2016-01-18 NOTE — ED Notes (Signed)
Spoke with Diane from ACT team. States she will come pick up patient this afternoon. Will notify this RN when she is on her way.

## 2016-01-18 NOTE — Consult Note (Signed)
Telepsych Consultation   Reason for Consult:  Psychosis  Referring Physician:  Forestine Na EDP  Patient Identification: David [name] MRN:  [id number] Principal Diagnosis: Schizophrenia, paranoid, chronic ([place]) Diagnosis:   Patient Active Problem List   Diagnosis Date Noted  . Schizophrenia, paranoid, chronic ([place]) [F20.0] 02/18/2012    Priority: High  . Schizophrenia ([place]) [F20.9]   . 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 History of idiopathic thrombocytopenic purpura [Z86.2] 12/05/2015  . Paranoid schizophrenia ([place]) [F20.0] 12/03/2015  . HTN (hypertension) [I10] 03/04/2012  . GERD (gastroesophageal reflux disease) [K21.9] 03/04/2012  . Insomnia [G47.00] 03/04/2012  . CKD (chronic kidney disease) stage 2, GFR 60-89 ml/min [N18.2] 03/04/2012   Total Time spent with patient: 50 minutes   Subjective:  David [name] is a 60 y.o. male patient admitted under IVC initiated by his mother due to increased aggressive behaviors. The patient has a history of chronic schizophrenia with poor insight into his illness.  Pt seen and chart reviewed. Pt known to me from numerous consults and inpatient visits. Pt is alert/oriented x4, calm, cooperative, and appropriate to situation. Pt denies suicidal/homicidal ideation and psychosis and does not appear to be responding to internal stimuli. Pt appears to be at or near baseline. Pt also reports "I'm taking all my medications, I've been doing that for 5 days. I feel much better. My mistake was that I quit taking my meds and couldn't get them." Pt is asking to leave and appears to be motivated to follow-up. ACT Team will be notified at this time by [employer] Work.   HPI:   David [name] is an 60 y.o. male who presents under IVC to [facility] who has a long history of schizophrenia. He was recently discharged on 12/03/2015 after a long stay on the adult unit. Patient was supposed to get his next injection of Invega sustenna on 01/10/2016 but did not receive  due to poor insight into his illness. The patient has been placed on the wait list for [facility] due to his severe and persistent mental illness. During assessment today patient was fixated only on taking his ativan and seeing if writer could get that ordered for him. He became upset when the Lorayne Bender was mentioned expressing disdain for "mind-altering medications." Patient talked at length about needing to expand certain health centers in [place]. He appeared upset that nobody understood the need for him to do this. Patient requested ativan for agitation for assessment as it appeared answering the questions aggravated him. David [name] appears to have continued mood lability and psychotic symptoms in the form of delusions. He denies any suicidal or homicidal ideation. Denies auditory hallucinations and does not appear to be responding to internal stimuli. Patient appears to function poorly outside of the hospital setting and continues to live alone at home near his mother.   HPI Elements:   Location:  psychosis, agitation . Quality:  Delusions, was aggressive per mother prior to admission. Severity:  Severe. Timing:  Last few weeks. Duration:  Chronic with long inpatient stay at [facility] in the past . Context:  Poor insight, not fully compliant with treatment.  Past Medical History:  Past Medical History  Diagnosis Date  . Schizophrenia ([place])   . Pneumonia   . Anxiety   . GERD (gastroesophageal reflux disease)   . COPD (chronic obstructive pulmonary disease) [facility])     Past Surgical History  Procedure Laterality Date  . Tonsillectomy     Family History:  Family History  Problem Relation Age  of Onset  . Arthritis Mother   . Hypertension Mother    Social History:  History  Alcohol Use No     History  Drug Use No    Comment: denies    Social History   Social History  . Marital Status: Legally Separated    Spouse Name: N/A  . Number of Children: N/A  . Years of Education: N/A   Social  History Main Topics  . Smoking status: Current Every Day Smoker -- 1.00 packs/day for 40 years    Types: Cigarettes    Start date: 04/14/1974  . Smokeless tobacco: Never Used  . Alcohol Use: No  . Drug Use: No     Comment: denies  . Sexual Activity: No   Other Topics Concern  . None   Social History Narrative   Additional Social History:    Pain Medications: see MAR Prescriptions: see MAR Over the Counter: see MAR History of alcohol / drug use?: Yes Longest period of sobriety (when/how long): Unknown Name of Substance 1: Alcohol (info rec'd from legal guardian, pts mother) 1 - Age of First Use: teenager 1 - Amount (size/oz): unknown 1 - Frequency: not often, but mom believes that pts drinking exacerbates his delusions 1 - Duration: occasional 1 - Last Use / Amount: unknown Name of Substance 2: Weed  (info rec'd from legal guardian, pts mother) 2 - Age of First Use: teen 2 - Amount (size/oz): unknown 2 - Frequency: not often, but mom believes that pts use exacerbates his delusions 2 - Duration: occasional 2 - Last Use / Amount: unknown                 Allergies:   Allergies  Allergen Reactions  . Lithium Nausea Only, Nausea And Vomiting and Other (See Comments)    "get's very sick"  . Trazodone And Nefazodone Nausea And Vomiting  . Trazodone Nausea And Vomiting    Labs: No results found for this or any previous visit (from the past 48 hour(s)).  Vitals: Blood pressure 111/52, pulse 79, temperature 98.1 F (36.7 C), temperature source Oral, resp. rate 20, height 6' (1.829 m), weight 95.255 kg (210 lb), SpO2 97 %.  Risk to Self: Suicidal Ideation: No Suicidal Intent: No Is patient at risk for suicide?: No Suicidal Plan?: No Access to Means: No What has been your use of drugs/alcohol within the last 12 months?: see above Intentional Self Injurious Behavior: None Risk to Others: Homicidal Ideation: No Thoughts of Harm to Others: No-Not Currently  Present/Within Last 6 Months Comment - Thoughts of Harm to Others: when mad, pt has threatened to harm others Current Homicidal Intent: No Current Homicidal Plan: No Access to Homicidal Means: No History of harm to others?: No Assessment of Violence: None Noted Does patient have access to weapons?: No Criminal Charges Pending?: No Does patient have a court date: No Prior Inpatient Therapy: Prior Inpatient Therapy: Yes Prior Therapy Dates: 2013; 2015; 2016 Prior Therapy Facilty/Provider(s): [facility]; [place] Reason for Treatment: Schizophrenia Prior Outpatient Therapy: Prior Outpatient Therapy: Yes Prior Therapy Dates: current Prior Therapy Facilty/Provider(s): [facility] Reason for Treatment: schizophrenia Does patient have an ACCT team?: Yes ([facility] in [employer]; [facility]) Does patient have Intensive In-House Services?  : No Does patient have P4CC services?: No  Current Facility-Administered Medications  Medication Dose Route Frequency Provider Last Rate Last Dose  . acetaminophen (TYLENOL) tablet 650 mg  650 mg Oral Q4H PRN Francine Graven, DO   650 mg at 01/16/16  0440  . albuterol (PROVENTIL HFA;VENTOLIN HFA) 108 (90 Base) MCG/ACT inhaler 2 puff  2 puff Inhalation Q6H PRN Francine Graven, DO      . alum & mag hydroxide-simeth (MAALOX/MYLANTA) 200-200-20 MG/5ML suspension 30 mL  30 mL Oral PRN Francine Graven, DO      . benzonatate (TESSALON) capsule 100 mg  100 mg Oral TID PRN Francine Graven, DO   100 mg at 01/14/16 1754  . benztropine (COGENTIN) tablet 0.5 mg  0.5 mg Oral QHS Francine Graven, DO   0.5 mg at 01/17/16 2211  . gabapentin (NEURONTIN) capsule 600 mg  600 mg Oral QHS Francine Graven, DO   600 mg at 01/17/16 2209  . haloperidol (HALDOL) tablet 12 mg  12 mg Oral QHS Francine Graven, DO   12 mg at 01/17/16 2210  . haloperidol decanoate (HALDOL DECANOATE) 100 MG/ML injection 100 mg  100 mg Intramuscular Q30 days Benjamine Mola, FNP      . hydrOXYzine (ATARAX/VISTARIL)  tablet 25 mg  25 mg Oral QHS PRN Francine Graven, DO   25 mg at 01/11/16 1950  . lidocaine (LIDODERM) 5 % 1 patch  1 patch Transdermal Q24H Francine Graven, DO   1 patch at 01/17/16 2212  . LORazepam (ATIVAN) tablet 1 mg  1 mg Oral Q6H PRN Fredia Sorrow, MD   1 mg at 01/17/16 2215  . losartan (COZAAR) tablet 50 mg  50 mg Oral Daily Francine Graven, DO   50 mg at 01/18/16 1004  . mirtazapine (REMERON) tablet 15 mg  15 mg Oral QHS Francine Graven, DO   15 mg at 01/17/16 2308  . nicotine (NICODERM CQ - dosed in mg/24 hours) patch 21 mg  21 mg Transdermal Daily Francine Graven, DO   21 mg at 01/18/16 1003  . nicotine (NICODERM CQ - dosed in mg/24 hours) patch 21 mg  21 mg Transdermal Daily PRN Francine Graven, DO   21 mg at 01/14/16 0940  . pantoprazole (PROTONIX) EC tablet 40 mg  40 mg Oral Daily Francine Graven, DO   40 mg at 01/18/16 1003   Current Outpatient Prescriptions  Medication Sig Dispense Refill  . albuterol (PROVENTIL HFA;VENTOLIN HFA) 108 (90 Base) MCG/ACT inhaler Inhale 2 puffs into the lungs every 6 (six) hours as needed for shortness of breath. 1 Inhaler 11  . benztropine (COGENTIN) 0.5 MG tablet Take 1 tablet (0.5 mg total) by mouth at bedtime. 30 tablet 0  . gabapentin (NEURONTIN) 300 MG capsule Take 2 capsules (600 mg total) by mouth at bedtime. 60 capsule 0  . haloperidol (HALDOL) 2 MG tablet Take 6 tablets (12 mg total) by mouth at bedtime. 180 tablet 0  . hydrOXYzine (ATARAX/VISTARIL) 25 MG tablet Take 1 tablet (25 mg total) by mouth at bedtime as needed (insomnia). 30 tablet 0  . lidocaine (LIDODERM) 5 % Place 1 patch onto the skin daily. Remove & Discard patch within 12 hours or as directed by MD 30 patch 0  . losartan (COZAAR) 50 MG tablet Take 1 tablet (50 mg total) by mouth daily. 30 tablet 5  . mirtazapine (REMERON) 15 MG tablet Take 1 tablet (15 mg total) by mouth at bedtime. 30 tablet 0  . nicotine (NICODERM CQ - DOSED IN MG/24 HOURS) 21 mg/24hr patch Place 1  patch (21 mg total) onto the skin daily. 28 patch 0  . omeprazole (PRILOSEC) 20 MG capsule Take 1 capsule (20 mg total) by mouth daily. 30 capsule 0  . paliperidone (INVEGA SUSTENNA)  156 MG/ML SUSP injection Inject 1 mL (156 mg total) into the muscle every 28 (twenty-eight) days. TO BE GIVEN AT CLINIC 0.9 mL 0  . [DISCONTINUED] zolpidem (AMBIEN) 5 MG tablet Take 5 mg by mouth at bedtime as needed.      Musculoskeletal: UTO, camera  Psychiatric Specialty Exam: Physical Exam  Review of Systems  Psychiatric/Behavioral: Positive for depression. Negative for hallucinations. The patient is nervous/anxious and has insomnia.   All other systems reviewed and are negative.   Blood pressure 111/52, pulse 79, temperature 98.1 F (36.7 C), temperature source Oral, resp. rate 20, height 6' (1.829 m), weight 95.255 kg (210 lb), SpO2 97 %.Body mass index is 28.47 kg/(m^2).  General Appearance: Casual and Fairly Groomed  Engineer, water::  Good  Speech:  Clear and Coherent  Volume:  Normal  Mood:  Euthymic  Affect:  Labile  Thought Process:  Coherent, Goal Directed and Linear  Orientation:  Full (Time, Place, and Person)  Thought Content:  Delusions and Rumination yet improving, at or near baseline  Suicidal Thoughts:  No  Homicidal Thoughts:  No  Memory:  Immediate;   Fair Recent;   Poor Remote;   Poor  Judgement:  Impaired  Insight:  Lacking  Psychomotor Activity:  Normal  Concentration:  Fair  Recall:  [employer] of Knowledge:Fair  Language: Good  Akathisia:  No  Handed:  Right  AIMS (if indicated):     Assets:  Communication Skills Desire for Improvement Physical Health Resilience Social Support  ADL's:  Intact  Cognition: WNL  Sleep:      Assessment: Schizophrenia, paranoid, chronic ([place]), improving, baseline, stable for discharge.   Plan:   -Continue current medication regimen; add Haldol Decanoate IM injection 100mg  q30 days for psychosis.   Disposition:  -Discharge home to  ACT Team  -[facility] TTS SW to help with the above and follow-up -Per Dr. Dwyane Dee, please give first dose of Haldol Decanoate IM injection 100mg  (x 1 dose). This will be q30 days.   Benjamine Mola, FNP-BC 01/18/2016 11:25 AM

## 2016-01-26 ENCOUNTER — Other Ambulatory Visit: Payer: Self-pay | Admitting: Family Medicine

## 2016-02-15 ENCOUNTER — Encounter: Payer: Self-pay | Admitting: Family Medicine

## 2016-02-15 ENCOUNTER — Ambulatory Visit (INDEPENDENT_AMBULATORY_CARE_PROVIDER_SITE_OTHER): Payer: Medicare Other | Admitting: Family Medicine

## 2016-02-15 VITALS — BP 140/78 | HR 94 | Temp 97.0°F | Ht 70.0 in | Wt 212.0 lb

## 2016-02-15 DIAGNOSIS — I10 Essential (primary) hypertension: Secondary | ICD-10-CM | POA: Diagnosis not present

## 2016-02-15 DIAGNOSIS — K219 Gastro-esophageal reflux disease without esophagitis: Secondary | ICD-10-CM

## 2016-02-15 MED ORDER — LOSARTAN POTASSIUM 50 MG PO TABS: 50.0000 mg | ORAL_TABLET | Freq: Every day | ORAL | Status: DC

## 2016-02-15 MED ORDER — PANTOPRAZOLE SODIUM 40 MG PO TBEC: 40.0000 mg | DELAYED_RELEASE_TABLET | Freq: Every day | ORAL | Status: DC

## 2016-02-15 NOTE — Progress Notes (Signed)
Subjective:  Patient ID: David [name], male    DOB: 1956/01/06  Age: 60 y.o. MRN: [id number]  CC: Hypertension and Gastroesophageal Reflux   HPI David [name] presents for  follow-up of hypertension. Patient has no history of headache chest pain or shortness of breath or recent cough. Patient also denies symptoms of TIA such as numbness weakness lateralizing. Patient checks  blood pressure at home and has not had any elevated readings recently. Patient denies side effects from his medication. States taking it regularly. Patient in for follow-up of GERD. Currently asymptomatic taking  PPI daily. There is no chest pain or heartburn. No hematemesis and no melena. No dysphagia or choking. Onset is remote. Progression is stable. Complicating factors, none.  Requests medical marijuana  because it calms him    History David [name] has a past medical history of Schizophrenia ([place]); Pneumonia; Anxiety; GERD (gastroesophageal reflux disease); and COPD (chronic obstructive pulmonary disease) ([place]).   He has past surgical history that includes Tonsillectomy.   His family history includes Arthritis in his mother; Hypertension in his mother.He reports that he has been smoking Cigarettes.  He started smoking about 41 years ago. He has a 40 pack-year smoking history. He has never used smokeless tobacco. He reports that he does not drink alcohol or use illicit drugs.    ROS Review of Systems  Constitutional: Negative for fever, chills, diaphoresis and unexpected weight change.  HENT: Negative for congestion, hearing loss, rhinorrhea and sore throat.   Eyes: Negative for visual disturbance.  Respiratory: Negative for cough and shortness of breath.   Cardiovascular: Negative for chest pain.  Gastrointestinal: Negative for abdominal pain, diarrhea and constipation.  Genitourinary: Negative for dysuria and flank pain.  Musculoskeletal: Negative for joint swelling and arthralgias.  Skin: Negative for rash.    Neurological: Negative for dizziness and headaches.  Psychiatric/Behavioral: Negative for sleep disturbance and dysphoric mood.    Objective:  BP 140/78 mmHg  Pulse 94  Temp(Src) 97 F (36.1 C) (Oral)  Ht 5\' 10"  (1.778 m)  Wt 212 lb (96.163 kg)  BMI 30.42 kg/m2  SpO2 96%  BP Readings from Last 3 Encounters:  02/15/16 140/78  01/18/16 163/84  12/30/15 109/76    Wt Readings from Last 3 Encounters:  02/15/16 212 lb (96.163 kg)  01/10/16 210 lb (95.255 kg)  12/03/15 190 lb (86.183 kg)     Physical Exam  Constitutional: He is oriented to person, place, and time. He appears well-developed and well-nourished. No distress.  HENT:  Head: Normocephalic and atraumatic.  Right Ear: External ear normal.  Left Ear: External ear normal.  Nose: Nose normal.  Mouth/Throat: Oropharynx is clear and moist.  Eyes: Conjunctivae and EOM are normal. Pupils are equal, round, and reactive to light.  Neck: Normal range of motion. Neck supple. No thyromegaly present.  Cardiovascular: Normal rate, regular rhythm and normal heart sounds.   No murmur heard. Pulmonary/Chest: Effort normal and breath sounds normal. No respiratory distress. He has no wheezes. He has no rales.  Abdominal: Soft. Bowel sounds are normal. He exhibits no distension. There is no tenderness.  Lymphadenopathy:    He has no cervical adenopathy.  Neurological: He is alert and oriented to person, place, and time. He has normal reflexes.  Skin: Skin is warm and dry.  Psychiatric: He has a normal mood and affect. His behavior is normal.     Lab Results  Component Value Date   WBC 13.7* 01/10/2016   HGB 14.6 01/10/2016   HCT  43.9 01/10/2016   PLT 61* 01/10/2016   GLUCOSE 90 01/10/2016   CHOL 170 12/06/2015   TRIG 108 12/06/2015   HDL 37* 12/06/2015   LDLCALC 111* 12/06/2015   ALT 50 12/29/2015   AST 26 12/29/2015   NA 138 01/10/2016   K 4.4 01/10/2016   CL 101 01/10/2016   CREATININE 1.26* 01/10/2016   BUN 13  01/10/2016   CO2 28 01/10/2016   TSH 1.396 12/06/2015   HGBA1C 5.5 12/06/2015    No results found.  Assessment & Plan:   David [name] was seen today for hypertension and gastroesophageal reflux.  Diagnoses and all orders for this visit:  Essential hypertension -     losartan (COZAAR) 50 MG tablet; Take 1 tablet (50 mg total) by mouth daily.  Gastroesophageal reflux disease without esophagitis -     pantoprazole (PROTONIX) 40 MG tablet; Take 1 tablet (40 mg total) by mouth daily. For stomach    Marijuana request declined. We reviewed pros and cons of use.  Lab reviewed. Mild renal insufficiency is stable Cozaar, protonix renewed. Psych meds reviewed & updated.     Follow-up: Return in about 6 months (around 08/14/2016).  Claretta Fraise, M.D.

## 2016-05-03 DIAGNOSIS — R69 Illness, unspecified: Secondary | ICD-10-CM | POA: Diagnosis not present

## 2016-05-03 DIAGNOSIS — Z87898 Personal history of other specified conditions: Secondary | ICD-10-CM | POA: Diagnosis not present

## 2016-05-03 DIAGNOSIS — D693 Immune thrombocytopenic purpura: Secondary | ICD-10-CM | POA: Diagnosis not present

## 2016-05-03 DIAGNOSIS — F2089 Other schizophrenia: Secondary | ICD-10-CM | POA: Diagnosis not present

## 2016-05-03 DIAGNOSIS — R740 Nonspecific elevation of levels of transaminase and lactic acid dehydrogenase [LDH]: Secondary | ICD-10-CM | POA: Diagnosis not present

## 2016-05-03 DIAGNOSIS — D729 Disorder of white blood cells, unspecified: Secondary | ICD-10-CM | POA: Diagnosis not present

## 2016-07-16 ENCOUNTER — Other Ambulatory Visit: Payer: Self-pay | Admitting: Family Medicine

## 2016-08-14 IMAGING — DX DG CHEST 2V
2 series · 2 of 2 positions shown · non-contrast
Comparison: 01/03/2014

CLINICAL DATA: Altered mental status, psychotic

EXAM:
CHEST  2 VIEW

[chest pa]
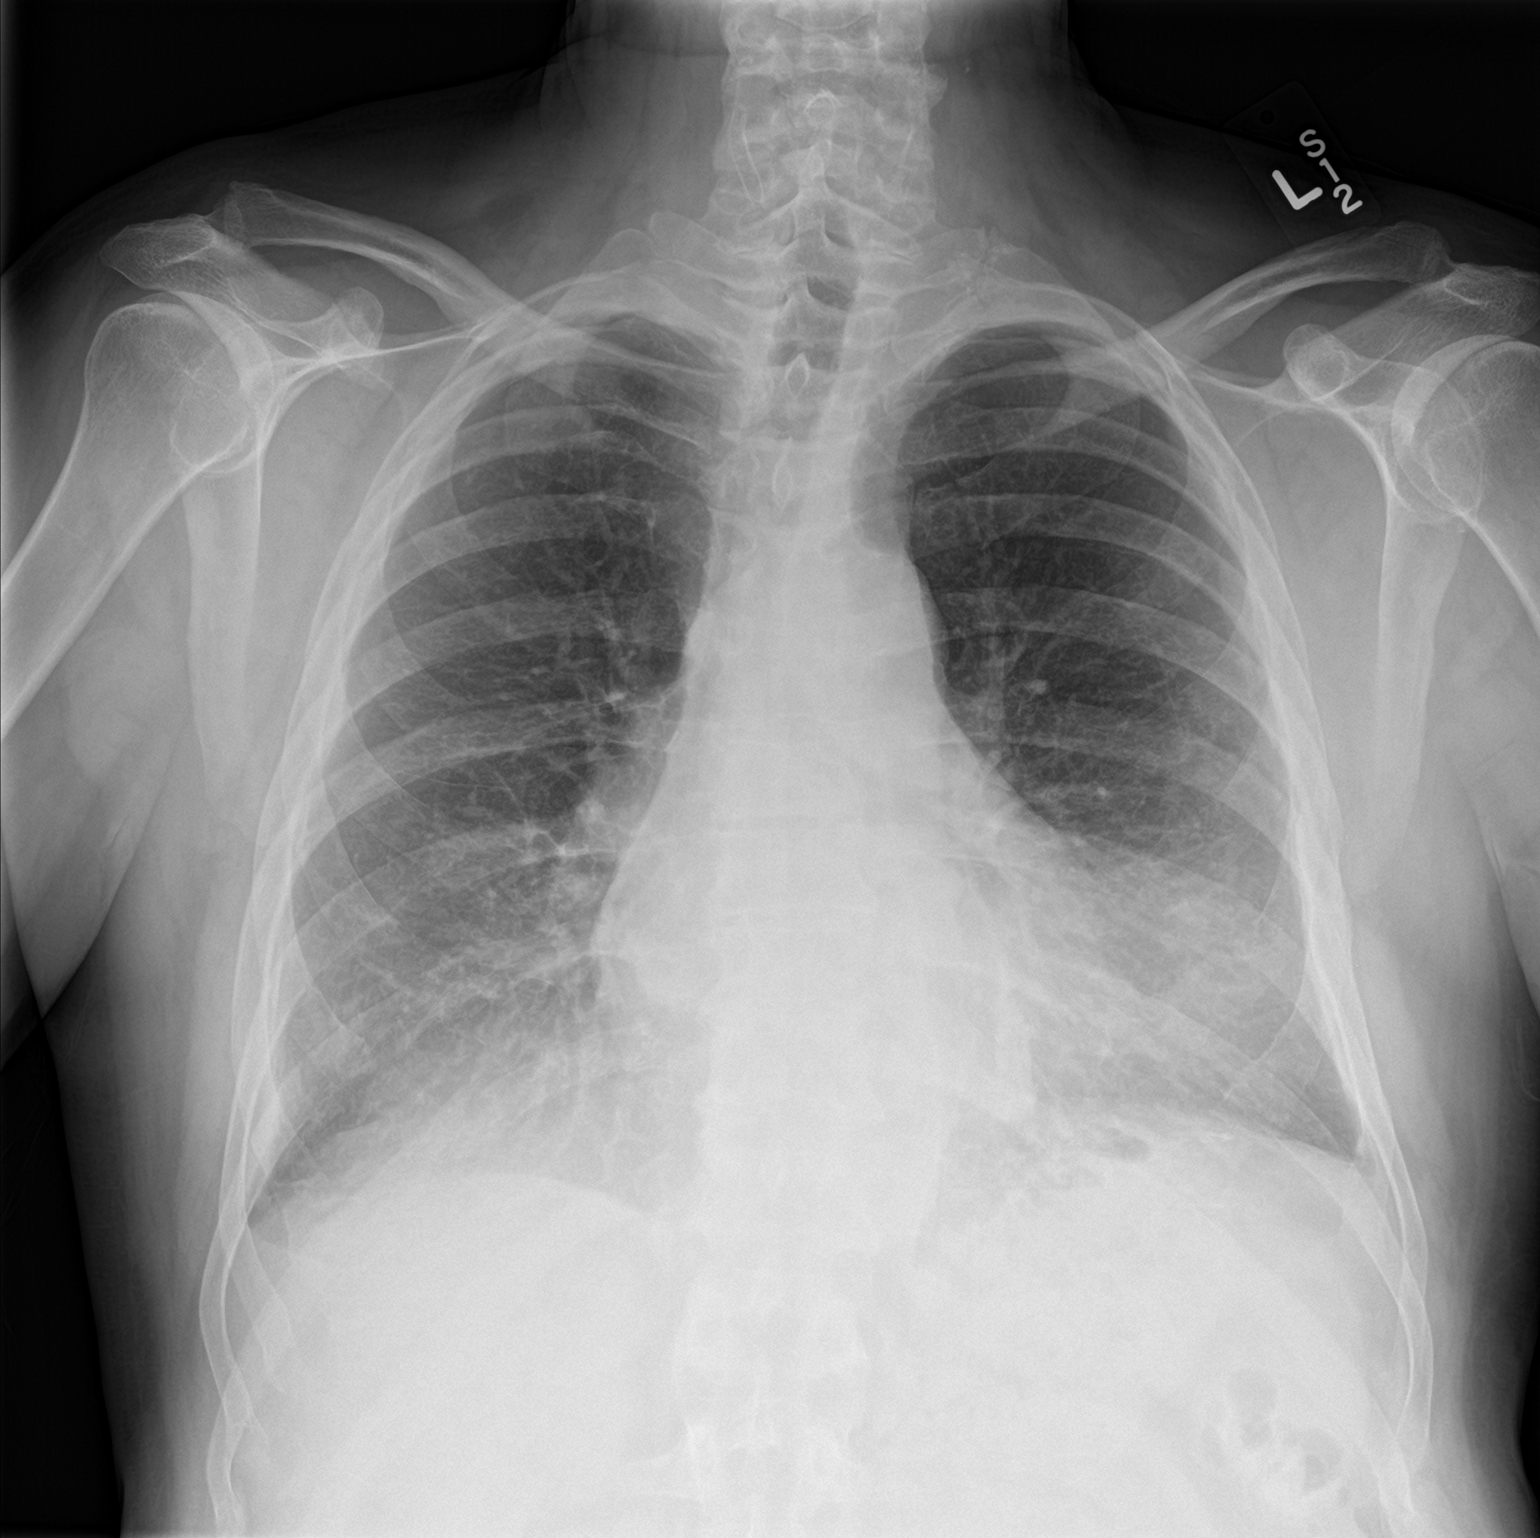

[chest lat]
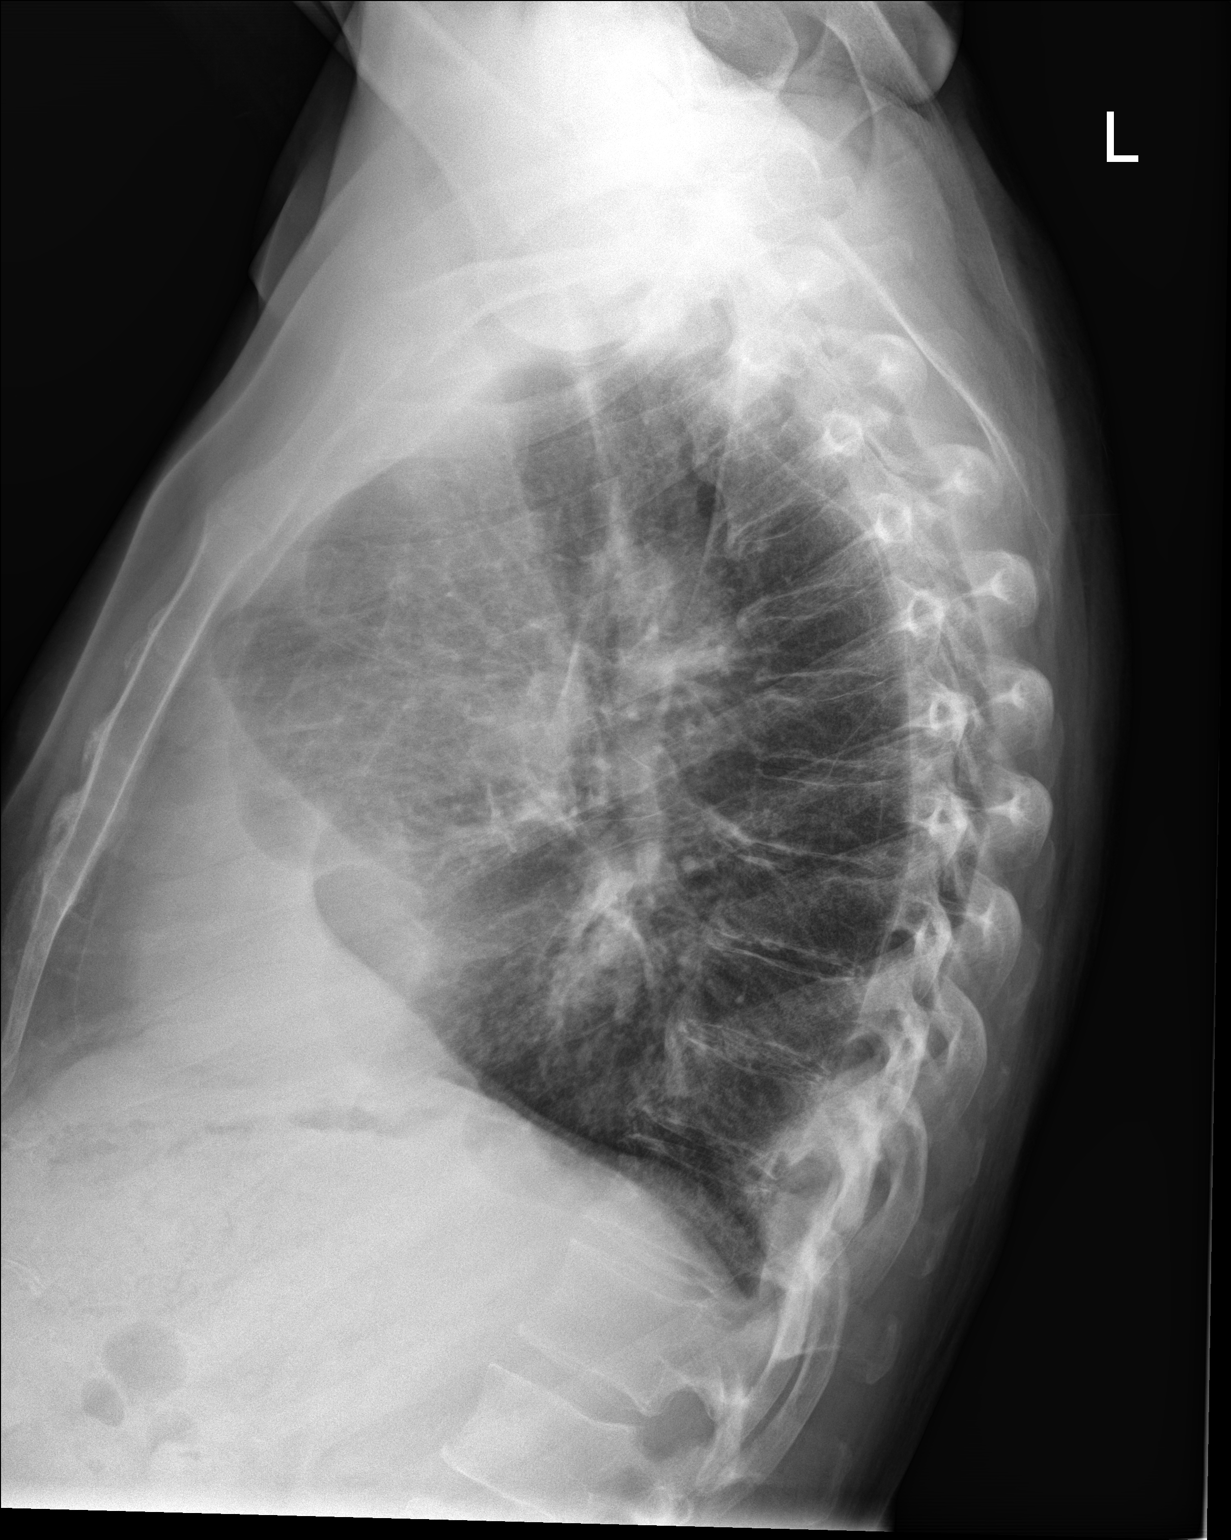

[2 of 2 positions shown; findings below may reference images not displayed]

FINDINGS: The lungs are hyperinflated likely secondary to COPD. There is no
focal parenchymal opacity. There is no pleural effusion or
pneumothorax. The heart and mediastinal contours are unremarkable.

The osseous structures are unremarkable.
IMPRESSION: No active cardiopulmonary disease.

## 2016-08-28 ENCOUNTER — Other Ambulatory Visit: Payer: Self-pay | Admitting: *Deleted

## 2016-08-28 MED ORDER — PANTOPRAZOLE SODIUM 40 MG PO TBEC: 40.0000 mg | DELAYED_RELEASE_TABLET | Freq: Every day | ORAL | 0 refills | Status: DC

## 2016-09-14 ENCOUNTER — Other Ambulatory Visit: Payer: Self-pay | Admitting: Family Medicine

## 2016-09-14 DIAGNOSIS — I10 Essential (primary) hypertension: Secondary | ICD-10-CM

## 2016-10-10 ENCOUNTER — Encounter (HOSPITAL_COMMUNITY): Payer: Medicare Other

## 2016-10-10 ENCOUNTER — Encounter (HOSPITAL_COMMUNITY): Payer: Medicare Other | Attending: Hematology | Admitting: Hematology

## 2016-10-10 ENCOUNTER — Encounter (HOSPITAL_COMMUNITY): Payer: Self-pay | Admitting: Hematology

## 2016-10-10 VITALS — BP 142/80 | HR 108 | Temp 98.4°F | Ht 72.0 in | Wt 209.5 lb

## 2016-10-10 DIAGNOSIS — D693 Immune thrombocytopenic purpura: Secondary | ICD-10-CM | POA: Insufficient documentation

## 2016-10-10 DIAGNOSIS — D721 Eosinophilia: Secondary | ICD-10-CM | POA: Diagnosis not present

## 2016-10-10 DIAGNOSIS — D72829 Elevated white blood cell count, unspecified: Secondary | ICD-10-CM | POA: Diagnosis not present

## 2016-10-10 LAB — COMPREHENSIVE METABOLIC PANEL
ALT: 14 U/L — ABNORMAL LOW (ref 17–63)
AST: 21 U/L (ref 15–41)
Albumin: 3.6 g/dL (ref 3.5–5.0)
Alkaline Phosphatase: 101 U/L (ref 38–126)
Anion gap: 5 (ref 5–15)
BILIRUBIN TOTAL: 0.6 mg/dL (ref 0.3–1.2)
BUN: 10 mg/dL (ref 6–20)
CO2: 30 mmol/L (ref 22–32)
Calcium: 8.6 mg/dL — ABNORMAL LOW (ref 8.9–10.3)
Chloride: 102 mmol/L (ref 101–111)
Creatinine, Ser: 1.56 mg/dL — ABNORMAL HIGH (ref 0.61–1.24)
GFR calc Af Amer: 55 mL/min — ABNORMAL LOW (ref >60)
GFR, EST NON AFRICAN AMERICAN: 47 mL/min — AB (ref >60)
Glucose, Bld: 120 mg/dL — ABNORMAL HIGH (ref 65–99)
POTASSIUM: 4.4 mmol/L (ref 3.5–5.1)
Sodium: 137 mmol/L (ref 135–145)
TOTAL PROTEIN: 6.7 g/dL (ref 6.5–8.1)

## 2016-10-10 LAB — RETICULOCYTES
RBC.: 4.95 MIL/uL (ref 4.22–5.81)
RETIC COUNT ABSOLUTE: 99 10*3/uL (ref 19.0–186.0)
Retic Ct Pct: 2 % (ref 0.4–3.1)

## 2016-10-10 LAB — CBC WITH DIFFERENTIAL/PLATELET
BASOS ABS: 0.1 10*3/uL (ref 0.0–0.1)
Basophils Relative: 1 %
EOS ABS: 1.6 10*3/uL — AB (ref 0.0–0.7)
Eosinophils Relative: 11 %
HEMATOCRIT: 45.6 % (ref 39.0–52.0)
Hemoglobin: 15.2 g/dL (ref 13.0–17.0)
LYMPHS PCT: 11 %
Lymphs Abs: 1.6 10*3/uL (ref 0.7–4.0)
MCH: 30.7 pg (ref 26.0–34.0)
MCHC: 33.3 g/dL (ref 30.0–36.0)
MCV: 92.1 fL (ref 78.0–100.0)
MONO ABS: 1.1 10*3/uL — AB (ref 0.1–1.0)
Monocytes Relative: 8 %
NEUTROS PCT: 69 %
Neutro Abs: 9.8 10*3/uL — ABNORMAL HIGH (ref 1.7–7.7)
Platelets: 107 10*3/uL — ABNORMAL LOW (ref 150–400)
RBC: 4.95 MIL/uL (ref 4.22–5.81)
RDW: 14.5 % (ref 11.5–15.5)
WBC: 14.2 10*3/uL — AB (ref 4.0–10.5)

## 2016-10-10 NOTE — Progress Notes (Signed)
[name]    HEMATOLOGY/ONCOLOGY CONSULTATION NOTE  Date of Service: 10/10/2016  Patient Care Team: Claretta Fraise, MD as PCP - General (Family Medicine)  CHIEF COMPLAINTS/PURPOSE OF CONSULTATION:  Thrombocytopenia/Chronic ITP  HISTORY OF PRESENTING ILLNESS:   David [name] is a wonderful 60 y.o. 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  Patient initially presented to [facility] where he was admitted for an acute upper GI bleed from 02/21/2014 to 03/01/2014. EGD with biopsies were consistent with spell esophagitis due to doxycycline versus severe erosive esophageal reflux and he was treated with Protonix 40 mg by mouth twice a day. He was at [facility] for his paranoid schizophrenia and was noted to have platelet counts of 43k on 03/04/2016. His Protonix was changed to Prilosec she had tolerated previously and his platelets improved to 105k by 03/10/2016. His platelet counts fluctuated from 90k  to 130k when he was readmitted to the hospital from 03/30/2014 for repeat GI bleed.  His platelets were back down to 62k on 04/21/2014 and patient received IVIG with normalization off his counts 294k. 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  There was reluctance to the use of corticosteroids due to the patient's unstable psychiatric illness for which he needed inpatient management at [facility].   His ECT treatments were delayed/interrupted to low platelets and therefore he was eventually placed on Nplate which he was on for 8 months. He transferred care to Dr. Jacquiline Doe in 05/2015 And his platelet counts have apparently been fairly stable and he has not needed any additional treatments for his ITP.  He notes that he does drink hard liquor 1 pint per week currently. Previously used to drink a fifth of hard liquor  every 2-3 days and sixpack of beer twice a week.  He has been on several psychiatric medications for his, schizophrenia.  he is here to establish follow-up for his thrombopenia. He notes that his platelets have been stable and that he currently has no issues with bleeding. No black stools or blood in the stools. No hematuria. No gum bleeds. No petechiae or purpura.   MEDICAL HISTORY:  Past Medical History:  Diagnosis Date  . Anxiety   . COPD (chronic obstructive pulmonary disease) ([place])   . GERD (gastroesophageal reflux disease)   . Pneumonia   . Schizophrenia ([place])     SURGICAL HISTORY: Past Surgical History:  Procedure Laterality Date  . TONSILLECTOMY      SOCIAL HISTORY: Social History   Social History  . Marital status: Legally Separated    Spouse name: N/A  . Number of children: N/A  . Years of education: N/A   Occupational History  . Not on file.   Social History Main Topics  . Smoking status: Current Every Day Smoker    Packs/day: 1.00    Years: 40.00    Types: Cigarettes    Start date: 04/14/1974  . Smokeless tobacco: Never Used  . Alcohol use No  . Drug use: No     Comment: denies  . Sexual activity: No   Other Topics Concern  . Not on file   Social History Narrative  . No narrative on file    FAMILY HISTORY: Family History  Problem Relation Age of Onset  . Arthritis Mother   . Hypertension Mother  ALLERGIES:  is allergic to lithium; trazodone and nefazodone; and trazodone.  MEDICATIONS:  Current Outpatient Prescriptions  Medication Sig Dispense Refill  . benztropine (COGENTIN) 0.5 MG tablet Take 1 tablet (0.5 mg total) by mouth at bedtime. 30 tablet 0  . ferrous fumarate (HEMOCYTE - 106 MG FE) 325 (106 Fe) MG TABS tablet Take 1 tablet by mouth daily.    [name] gabapentin (NEURONTIN) 300 MG capsule Take 2 capsules (600 mg total) by mouth at bedtime. 60 capsule 0  . haloperidol (HALDOL) 2 MG tablet Take 6 tablets (12 mg total) by mouth at  bedtime. 180 tablet 0  . hydrOXYzine (ATARAX/VISTARIL) 25 MG tablet Take 1 tablet (25 mg total) by mouth at bedtime as needed (insomnia). 30 tablet 0  . lidocaine (LIDODERM) 5 % Place 1 patch onto the skin daily. Remove & Discard patch within 12 hours or as directed by MD 30 patch 0  . losartan (COZAAR) 50 MG tablet TAKE 1 TABLET (50 MG TOTAL) BY MOUTH DAILY. 30 tablet 0  . mirtazapine (REMERON) 15 MG tablet Take 1 tablet (15 mg total) by mouth at bedtime. 30 tablet 0  . pantoprazole (PROTONIX) 40 MG tablet Take 1 tablet (40 mg total) by mouth daily. 30 tablet 0  . QUEtiapine (SEROQUEL) 25 MG tablet Take 25 mg by mouth 3 (three) times daily.     No current facility-administered medications for this visit.     REVIEW OF SYSTEMS:    10 Point review of Systems was done is negative except as noted above.  PHYSICAL EXAMINATION: ECOG PERFORMANCE STATUS: 2 - Symptomatic, <50% confined to bed  . Vitals:   10/10/16 1355  BP: (!) 142/80  Pulse: (!) 108  Temp: 98.4 F (36.9 C)   Filed Weights   10/10/16 1355  Weight: 209 lb 8 oz (95 kg)   .Body mass index is 28.41 kg/m.  GENERAL:alert, in no acute distress and comfortable SKIN: skin color, texture, turgor are normal, no rashes or significant lesions EYES: normal, conjunctiva are pink and non-injected, sclera clear OROPHARYNX:no exudate, no erythema and lips, buccal mucosa, and tongue normal  NECK: supple, no JVD, thyroid normal size, non-tender, without nodularity LYMPH:  no palpable lymphadenopathy in the cervical, axillary or inguinal LUNGS: clear to auscultation with normal respiratory effort HEART: regular rate & rhythm,  no murmurs and no lower extremity edema ABDOMEN: abdomen soft, non-tender, normoactive bowel sounds , No palpable hepatosplenomegaly  Musculoskeletal: no cyanosis of digits and no clubbing  PSYCH: alert & oriented x 3 with fluent speech NEURO: no focal motor/sensory deficits  LABORATORY DATA:  I have reviewed  the data as listed  . CBC Latest Ref Rng & Units 10/10/2016 01/10/2016 12/01/2015  WBC 4.0 - 10.5 K/uL 14.2(H) 13.7(H) 14.2(H)  Hemoglobin 13.0 - 17.0 g/dL 15.2 14.6 15.2  Hematocrit 39.0 - 52.0 % 45.6 43.9 45.0  Platelets 150 - 400 K/uL 107(L) 61(L) 146(L)    . CMP Latest Ref Rng & Units 10/10/2016 01/10/2016 12/29/2015  Glucose 65 - 99 mg/dL 120(H) 90 99  BUN 6 - 20 mg/dL 10 13 25(H)  Creatinine 0.61 - 1.24 mg/dL 1.56(H) 1.26(H) 1.32(H)  Sodium 135 - 145 mmol/L 137 138 138  Potassium 3.5 - 5.1 mmol/L 4.4 4.4 4.7  Chloride 101 - 111 mmol/L 102 101 102  CO2 22 - 32 mmol/L 30 28 29   Calcium 8.9 - 10.3 mg/dL 8.6(L) 8.9 8.9  Total Protein 6.5 - 8.1 g/dL 6.7 - 6.3(L)  Total Bilirubin 0.3 -  1.2 mg/dL 0.6 - 0.6  Alkaline Phos 38 - 126 U/L 101 - 84  AST 15 - 41 U/L 21 - 26  ALT 17 - 63 U/L 14(L) - 50     RADIOGRAPHIC STUDIES: I have personally reviewed the radiological images as listed and agreed with the findings in the report. No results found.  ASSESSMENT & PLAN:   60 year old gentleman with a history of paranoid schizophrenia and multiple psychiatric medications with  #1 History of chronic thrombocytopenia from 2015 . This seems to be related to some element of chronic ITP as suggested by platelet normalization with previous IVIG 2. There could be additional factors for thrombocytopenia including his alcohol use, psychiatric medications and previously from possible PPI use  (tolerated omeprazole better than pantoprazole ) Plan  -Patient had CBC today that shows a platelet count of 107k with no issues with bleeding . -No indication for treatment of his thrombocytopenia at this time. -Patient was counseled to completely avoid alcohol use especially in the setting of behavioral illness and multiple psychiatric medications . -Would not be a good candidate for steroids in the future if his platelet counts dropped . -He appears to have had good response to IVIG and Romiplostim in the  past.  -would consider Nplate if his platelet counts dropped below 50,000 or if there is issues with bleeding or if the counts were too low for any planned ECT's in the future  #2 chronic leukocytosis with eosinophilia which has been stable for nearly a year. Today eosinophil count is 1600. And associated with some mild neutrophilia of 9.8k Hemoglobin stable at 15.2 .  This could be from his psychiatric medications . No indication of a lymphoproliferative or myeloproliferative process at this time.  #3 . Patient Active Problem List   Diagnosis Date Noted  . Essential hypertension 02/15/2016  . Schizophrenia ([place])   . Hyperprolactinemia ([place]) 12/07/2015  . History of idiopathic thrombocytopenic purpura 12/05/2015  . Paranoid schizophrenia ([place]) 12/03/2015  . GERD (gastroesophageal reflux disease) 03/04/2012  . Insomnia 03/04/2012  . CKD (chronic kidney disease) stage 2, GFR 60-89 ml/min 03/04/2012  . Schizophrenia, paranoid, chronic ([place]) 02/18/2012   -Continue follow-up with primary care physician for continued management of other medical problems and with his psychiatrist for management of his paranoid schizophrenia .  Return to clinic with Dr. Whitney Muse in 6 months with repeat CBC  All of the patients questions were answered with apparent satisfaction. The patient knows to call the clinic with any problems, questions or concerns.  I spent 50 minutes counseling the patient face to face. The total time spent in the appointment was 60 minutes and more than 50% was on counseling and direct patient cares.    Sullivan Lone MD [place] AAHIVMS [facility] Hematology/Oncology Physician [facility]  (Office):       [phone] (Work cell):  [phone] (Fax):           [phone]  10/10/2016 1:55 PM

## 2016-10-10 NOTE — Patient Instructions (Signed)
[place] at [facility]  Discharge Instructions:  Labs today Follow up in 4 months with Dr. Whitney Muse _______________________________________________________________  Thank you for choosing [place] at [facility] to provide your oncology and hematology care.  To afford each patient quality time with our providers, please arrive at least 15 minutes before your scheduled appointment.  You need to re-schedule your appointment if you arrive 10 or more minutes late.  We strive to give you quality time with our providers, and arriving late affects you and other patients whose appointments are after yours.  Also, if you no show three or more times for appointments you may be dismissed from the clinic.  Again, thank you for choosing [place] at [place] hope is that these requests will allow you access to exceptional care and in a timely manner. _______________________________________________________________  If you have questions after your visit, please contact our office at [phone] between the hours of 8:30 a.m. and 5:00 p.m. Voicemails left after 4:30 p.m. will not be returned until the following business day. _______________________________________________________________  For prescription refill requests, have your pharmacy contact our office. _______________________________________________________________  Recommendations made by the consultant and any test results will be sent to your referring physician. _______________________________________________________________

## 2016-11-01 ENCOUNTER — Other Ambulatory Visit: Payer: Self-pay | Admitting: Family Medicine

## 2016-11-07 ENCOUNTER — Other Ambulatory Visit: Payer: Self-pay | Admitting: Family Medicine

## 2016-11-07 DIAGNOSIS — Z23 Encounter for immunization: Secondary | ICD-10-CM | POA: Diagnosis not present

## 2016-11-07 DIAGNOSIS — I10 Essential (primary) hypertension: Secondary | ICD-10-CM

## 2016-11-28 ENCOUNTER — Other Ambulatory Visit: Payer: Self-pay | Admitting: Family Medicine

## 2016-11-28 NOTE — Telephone Encounter (Signed)
Authorize 30 days only. Then contact the patient letting them know that they will need an appointment before any further prescriptions can be sent in. 

## 2017-01-14 ENCOUNTER — Other Ambulatory Visit: Payer: Self-pay | Admitting: Family Medicine

## 2017-01-16 NOTE — Telephone Encounter (Signed)
Last seen 02/15/2016

## 2017-01-17 NOTE — Telephone Encounter (Signed)
Authorize 30 days only. Then contact the patient letting them know that they will need an appointment before any further prescriptions can be sent in. 

## 2017-02-01 ENCOUNTER — Other Ambulatory Visit: Payer: Self-pay | Admitting: Family Medicine

## 2017-02-01 DIAGNOSIS — I10 Essential (primary) hypertension: Secondary | ICD-10-CM

## 2017-03-11 ENCOUNTER — Ambulatory Visit (HOSPITAL_COMMUNITY): Payer: Self-pay

## 2017-03-15 ENCOUNTER — Other Ambulatory Visit: Payer: Self-pay | Admitting: Family Medicine

## 2017-03-20 ENCOUNTER — Other Ambulatory Visit: Payer: Self-pay | Admitting: Family Medicine

## 2017-04-03 ENCOUNTER — Encounter (HOSPITAL_COMMUNITY): Payer: Medicare HMO | Attending: Oncology | Admitting: Oncology

## 2017-04-03 ENCOUNTER — Encounter (HOSPITAL_COMMUNITY): Payer: Self-pay

## 2017-04-03 ENCOUNTER — Encounter (HOSPITAL_COMMUNITY): Payer: Medicare HMO

## 2017-04-03 VITALS — BP 145/65 | HR 89 | Temp 97.9°F | Resp 18 | Wt 210.6 lb

## 2017-04-03 DIAGNOSIS — D721 Eosinophilia: Secondary | ICD-10-CM | POA: Diagnosis not present

## 2017-04-03 DIAGNOSIS — E221 Hyperprolactinemia: Secondary | ICD-10-CM

## 2017-04-03 DIAGNOSIS — D693 Immune thrombocytopenic purpura: Secondary | ICD-10-CM | POA: Diagnosis not present

## 2017-04-03 DIAGNOSIS — F200 Paranoid schizophrenia: Secondary | ICD-10-CM

## 2017-04-03 DIAGNOSIS — D72829 Elevated white blood cell count, unspecified: Secondary | ICD-10-CM

## 2017-04-03 DIAGNOSIS — F17210 Nicotine dependence, cigarettes, uncomplicated: Secondary | ICD-10-CM | POA: Diagnosis not present

## 2017-04-03 DIAGNOSIS — D696 Thrombocytopenia, unspecified: Secondary | ICD-10-CM | POA: Insufficient documentation

## 2017-04-03 DIAGNOSIS — F1010 Alcohol abuse, uncomplicated: Secondary | ICD-10-CM | POA: Diagnosis not present

## 2017-04-03 LAB — CBC WITH DIFFERENTIAL/PLATELET
BASOS ABS: 0.1 10*3/uL (ref 0.0–0.1)
BASOS PCT: 1 %
EOS ABS: 1.4 10*3/uL — AB (ref 0.0–0.7)
Eosinophils Relative: 9 %
HEMATOCRIT: 40.6 % (ref 39.0–52.0)
HEMOGLOBIN: 13 g/dL (ref 13.0–17.0)
Lymphocytes Relative: 11 %
Lymphs Abs: 1.8 10*3/uL (ref 0.7–4.0)
MCH: 28.3 pg (ref 26.0–34.0)
MCHC: 32 g/dL (ref 30.0–36.0)
MCV: 88.5 fL (ref 78.0–100.0)
MONOS PCT: 8 %
Monocytes Absolute: 1.3 10*3/uL — ABNORMAL HIGH (ref 0.1–1.0)
NEUTROS ABS: 11.1 10*3/uL — AB (ref 1.7–7.7)
NEUTROS PCT: 71 %
Platelets: 76 10*3/uL — ABNORMAL LOW (ref 150–400)
RBC: 4.59 MIL/uL (ref 4.22–5.81)
RDW: 15.8 % — AB (ref 11.5–15.5)
WBC: 15.6 10*3/uL — ABNORMAL HIGH (ref 4.0–10.5)

## 2017-04-03 LAB — COMPREHENSIVE METABOLIC PANEL
ALBUMIN: 3.4 g/dL — AB (ref 3.5–5.0)
ALK PHOS: 105 U/L (ref 38–126)
ALT: 11 U/L — ABNORMAL LOW (ref 17–63)
ANION GAP: 7 (ref 5–15)
AST: 17 U/L (ref 15–41)
BUN: 13 mg/dL (ref 6–20)
CO2: 29 mmol/L (ref 22–32)
Calcium: 8.6 mg/dL — ABNORMAL LOW (ref 8.9–10.3)
Chloride: 100 mmol/L — ABNORMAL LOW (ref 101–111)
Creatinine, Ser: 1.63 mg/dL — ABNORMAL HIGH (ref 0.61–1.24)
GFR calc Af Amer: 52 mL/min — ABNORMAL LOW (ref >60)
GFR calc non Af Amer: 45 mL/min — ABNORMAL LOW (ref >60)
GLUCOSE: 134 mg/dL — AB (ref 65–99)
POTASSIUM: 4.2 mmol/L (ref 3.5–5.1)
SODIUM: 136 mmol/L (ref 135–145)
Total Bilirubin: 0.6 mg/dL (ref 0.3–1.2)
Total Protein: 6.5 g/dL (ref 6.5–8.1)

## 2017-04-03 NOTE — Progress Notes (Signed)
HEMATOLOGY/ONCOLOGY PROGRESS NOTE  Date of Service: 04/03/2017  Patient Care Team: Claretta Fraise, MD as PCP - General (Family Medicine)  CHIEF COMPLAINTS/PURPOSE OF CONSULTATION:  Thrombocytopenia/Chronic ITP  HISTORY OF PRESENTING ILLNESS:  David [name] is a wonderful 61 y.o. male who returns for follow up of chronic ITP.  He is doing well today. He is getting his labs done after his visit today. He is having some cracking and pain in his lips. This is tolerable. Denies easy bruising/bleeding, chest pain, SOB, abdominal pain, or any other concerns. Overall he has been doing well.  MEDICAL HISTORY:  Past Medical History:  Diagnosis Date  . Anxiety   . COPD (chronic obstructive pulmonary disease) ([place])   . GERD (gastroesophageal reflux disease)   . Pneumonia   . Schizophrenia ([place])     SURGICAL HISTORY: Past Surgical History:  Procedure Laterality Date  . TONSILLECTOMY      SOCIAL HISTORY: Social History   Social History  . Marital status: Legally Separated    Spouse name: N/A  . Number of children: N/A  . Years of education: N/A   Occupational History  . Not on file.   Social History Main Topics  . Smoking status: Current Every Day Smoker    Packs/day: 1.00    Years: 40.00    Types: Cigarettes    Start date: 04/14/1974  . Smokeless tobacco: Never Used  . Alcohol use No  . Drug use: No     Comment: denies  . Sexual activity: No   Other Topics Concern  . Not on file   Social History Narrative  . No narrative on file    FAMILY HISTORY: Family History  Problem Relation Age of Onset  . Arthritis Mother   . Hypertension Mother     ALLERGIES:  is allergic to lithium; trazodone and nefazodone; and trazodone.  MEDICATIONS:  Current Outpatient Prescriptions  Medication Sig Dispense Refill  . benztropine (COGENTIN) 0.5 MG tablet Take 1 tablet (0.5 mg total) by mouth at bedtime. 30 tablet 0  . ferrous fumarate (HEMOCYTE - 106 MG FE) 325 (106 Fe) MG TABS  tablet Take 1 tablet by mouth daily.    [name] gabapentin (NEURONTIN) 300 MG capsule Take 2 capsules (600 mg total) by mouth at bedtime. 60 capsule 0  . haloperidol (HALDOL) 5 MG tablet Take by mouth.    . hydrOXYzine (ATARAX/VISTARIL) 25 MG tablet Take 1 tablet (25 mg total) by mouth at bedtime as needed (insomnia). 30 tablet 0  . losartan (COZAAR) 50 MG tablet TAKE 1 TABLET (50 MG TOTAL) BY MOUTH DAILY. 30 tablet 0  . pantoprazole (PROTONIX) 40 MG tablet TAKE 1 TABLET (40 MG TOTAL) BY MOUTH DAILY. 30 tablet 0  . zolpidem (AMBIEN) 5 MG tablet Take 5 mg by mouth at bedtime as needed for sleep.     No current facility-administered medications for this visit.    Review of Systems  Constitutional: Negative.   HENT: Negative.   Eyes: Negative.   Respiratory: Negative.  Negative for shortness of breath.   Cardiovascular: Negative.  Negative for chest pain.  Gastrointestinal: Negative.  Negative for abdominal pain.  Genitourinary: Negative.   Musculoskeletal: Negative.   Skin:       Cracking skin on lips  Neurological: Negative.   Endo/Heme/Allergies: Negative.  Does not bruise/bleed easily.  Psychiatric/Behavioral: Negative.   All other systems reviewed and are negative. 14 point review of systems was performed and is negative except as detailed under history of present  illness and above   PHYSICAL EXAMINATION: ECOG PERFORMANCE STATUS: 2 - Symptomatic, <50% confined to bed  Vitals:   04/03/17 1453  BP: (!) 145/65  Pulse: 89  Resp: 18  Temp: 97.9 F (36.6 C)   Filed Weights   04/03/17 1453  Weight: 210 lb 9.6 oz (95.5 kg)   .Body mass index is 28.56 kg/m.   Physical Exam  Constitutional: He is oriented to person, place, and time and well-developed, well-nourished, and in no distress.  HENT:  Head: Normocephalic and atraumatic.  Mouth/Throat: Oropharynx is clear and moist.  Eyes: Conjunctivae and EOM are normal. Pupils are equal, round, and reactive to light.  Neck: Normal  range of motion. Neck supple.  Cardiovascular: Normal rate, regular rhythm and normal heart sounds.   Pulmonary/Chest: Effort normal and breath sounds normal.  Abdominal: Soft. Bowel sounds are normal.  Musculoskeletal: Normal range of motion.  Neurological: He is alert and oriented to person, place, and time. Gait normal.  Skin: Skin is warm and dry.  Nursing note and vitals reviewed.  LABORATORY DATA:  I have reviewed the data as listed  CBC Latest Ref Rng & Units 10/10/2016 01/10/2016 12/01/2015  WBC 4.0 - 10.5 K/uL 14.2(H) 13.7(H) 14.2(H)  Hemoglobin 13.0 - 17.0 g/dL 15.2 14.6 15.2  Hematocrit 39.0 - 52.0 % 45.6 43.9 45.0  Platelets 150 - 400 K/uL 107(L) 61(L) 146(L)   CMP Latest Ref Rng & Units 10/10/2016 01/10/2016 12/29/2015  Glucose 65 - 99 mg/dL 120(H) 90 99  BUN 6 - 20 mg/dL 10 13 25(H)  Creatinine 0.61 - 1.24 mg/dL 1.56(H) 1.26(H) 1.32(H)  Sodium 135 - 145 mmol/L 137 138 138  Potassium 3.5 - 5.1 mmol/L 4.4 4.4 4.7  Chloride 101 - 111 mmol/L 102 101 102  CO2 22 - 32 mmol/L 30 28 29   Calcium 8.9 - 10.3 mg/dL 8.6(L) 8.9 8.9  Total Protein 6.5 - 8.1 g/dL 6.7 - 6.3(L)  Total Bilirubin 0.3 - 1.2 mg/dL 0.6 - 0.6  Alkaline Phos 38 - 126 U/L 101 - 84  AST 15 - 41 U/L 21 - 26  ALT 17 - 63 U/L 14(L) - 50   RADIOGRAPHIC STUDIES: I have personally reviewed the radiological images as listed and agreed with the findings in the report. No results found.  ASSESSMENT & PLAN:  61 year old gentleman with a history of paranoid schizophrenia and multiple psychiatric medications with   #1 History of chronic thrombocytopenia from 2015 . This seems to be related to some element of chronic ITP as suggested by platelet normalization with previous IVIG 2. There could be additional factors for thrombocytopenia including his alcohol use, psychiatric medications and previously from possible PPI use  (tolerated omeprazole better than pantoprazole )   -Follow up on CBC today.  -No indication  for treatment of his thrombocytopenia at this time. -Patient was counseled to completely avoid alcohol use especially in the setting of behavioral illness and multiple psychiatric medications . -Would not be a good candidate for steroids in the future if his platelet counts dropped . -He appears to have had good response to IVIG and Romiplostim in the past.  -would consider Nplate if his platelet counts dropped below 50,000 or if there is issues with bleeding or if the counts were too low for any planned ECT's in the future  #2 chronic leukocytosis with eosinophilia which has been stable for nearly a year.  -Continue to monitor.  This could be from his psychiatric medications . No indication of a  lymphoproliferative or myeloproliferative process at this time.  #3 . Patient Active Problem List   Diagnosis Date Noted  . Essential hypertension 02/15/2016  . Schizophrenia ([place])   . Hyperprolactinemia ([place]) 12/07/2015  . History of idiopathic thrombocytopenic purpura 12/05/2015  . Paranoid schizophrenia ([place]) 12/03/2015  . GERD (gastroesophageal reflux disease) 03/04/2012  . Insomnia 03/04/2012  . CKD (chronic kidney disease) stage 2, GFR 60-89 ml/min 03/04/2012  . Schizophrenia, paranoid, chronic ([place]) 02/18/2012   -Continue follow-up with primary care physician for continued management of other medical problems and with his psychiatrist for management of his paranoid schizophrenia .  Dispo: He is getting labs after our visit today. I will call him with the results.   He will return for follow up in 6 months.   All of the patients questions were answered with apparent satisfaction. The patient knows to call the clinic with any problems, questions or concerns.  This document serves as a record of services personally performed by Twana First, MD. It was created on her behalf by Martinique Casey, a trained medical scribe. The creation of this record is based on the scribe's personal  observations and the provider's statements to them. This document has been checked and approved by the attending provider.  I have reviewed the above documentation for accuracy and completeness and I agree with the above.  04/03/2017 3:13 PM

## 2017-04-03 NOTE — Patient Instructions (Signed)
[facility] at [facility] Discharge Instructions  RECOMMENDATIONS MADE BY THE CONSULTANT AND ANY TEST RESULTS WILL BE SENT TO YOUR REFERRING PHYSICIAN.  You were seen today by Dr. Louise Zhou Follow up in 6 months with lab work See Amy up front for appointments   Thank you for choosing  [facility] at [facility] to provide your oncology and hematology care.  To afford each patient quality time with our provider, please arrive at least 15 minutes before your scheduled appointment time.    If you have a lab appointment with the Cancer Center please come in thru the  Main Entrance and check in at the main information desk  You need to re-schedule your appointment should you arrive 10 or more minutes late.  We strive to give you quality time with our providers, and arriving late affects you and other patients whose appointments are after yours.  Also, if you no show three or more times for appointments you may be dismissed from the clinic at the providers discretion.     Again, thank you for choosing [facility].  Our hope is that these requests will decrease the amount of time that you wait before being seen by our physicians.       _____________________________________________________________  Should you have questions after your visit to [facility], please contact our office at [phone] between the hours of 8:30 a.m. and 4:30 p.m.  Voicemails left after 4:30 p.m. will not be returned until the following business day.  For prescription refill requests, have your pharmacy contact our office.       Resources For Cancer Patients and their Caregivers ? American Cancer Society: Can assist with transportation, wigs, general needs, runs Look Good Feel Better.        1-888-227-6333 ? Cancer Care: Provides financial assistance, online support groups, medication/co-pay assistance.  1-800-813-HOPE (4673) ? [facility] Assists [place] cancer patients and their families through emotional , educational and financial support.  336-427-4357 ? [place] DSS Where to apply for food stamps, Medicaid and utility assistance. 336-342-1394 ? [facility]: Transportation to medical appointments. 336-347-2287 ? Social Security Administration: May apply for disability if have a Stage IV cancer. 336-342-7796 1-800-772-1213 ? [place] Aging, Disability and Transit Services: Assists with nutrition, care and transit needs. 336-349-2343  Cancer Center Support Programs: @10RELATIVEDAYS@ > Cancer Support Group  2nd Tuesday of the month 1pm-2pm, Journey Room  > Creative Journey  3rd Tuesday of the month 1130am-1pm, Journey Room  > Look Good Feel Better  1st Wednesday of the month 10am-12 noon, Journey Room (Call American Cancer Society to register 1-800-395-5775)    

## 2017-04-09 ENCOUNTER — Other Ambulatory Visit: Payer: Self-pay | Admitting: Family Medicine

## 2017-04-09 DIAGNOSIS — I10 Essential (primary) hypertension: Secondary | ICD-10-CM

## 2017-05-13 ENCOUNTER — Ambulatory Visit (HOSPITAL_COMMUNITY)
Admission: RE | Admit: 2017-05-13 | Discharge: 2017-05-13 | Disposition: A | Discharge status: Home / Self Care | Payer: Medicare HMO | Source: Ambulatory Visit | Attending: Family Medicine | Admitting: Family Medicine

## 2017-05-13 ENCOUNTER — Other Ambulatory Visit: Payer: Self-pay | Admitting: Family Medicine

## 2017-05-13 ENCOUNTER — Encounter: Payer: Self-pay | Admitting: Family Medicine

## 2017-05-13 ENCOUNTER — Emergency Department (HOSPITAL_COMMUNITY)
Admission: EM | Admit: 2017-05-13 | Discharge: 2017-05-13 | Disposition: A | Discharge status: Home / Self Care | Payer: Medicare HMO | Attending: Emergency Medicine | Admitting: Emergency Medicine

## 2017-05-13 ENCOUNTER — Ambulatory Visit (INDEPENDENT_AMBULATORY_CARE_PROVIDER_SITE_OTHER): Payer: Medicare HMO | Admitting: Family Medicine

## 2017-05-13 ENCOUNTER — Encounter (HOSPITAL_COMMUNITY): Payer: Self-pay | Admitting: Emergency Medicine

## 2017-05-13 VITALS — BP 121/79 | HR 88 | Temp 97.0°F | Ht 72.0 in | Wt 206.0 lb

## 2017-05-13 DIAGNOSIS — I82432 Acute embolism and thrombosis of left popliteal vein: Secondary | ICD-10-CM | POA: Insufficient documentation

## 2017-05-13 DIAGNOSIS — I824Y2 Acute embolism and thrombosis of unspecified deep veins of left proximal lower extremity: Secondary | ICD-10-CM | POA: Insufficient documentation

## 2017-05-13 DIAGNOSIS — J449 Chronic obstructive pulmonary disease, unspecified: Secondary | ICD-10-CM | POA: Insufficient documentation

## 2017-05-13 DIAGNOSIS — I129 Hypertensive chronic kidney disease with stage 1 through stage 4 chronic kidney disease, or unspecified chronic kidney disease: Secondary | ICD-10-CM | POA: Diagnosis not present

## 2017-05-13 DIAGNOSIS — I82412 Acute embolism and thrombosis of left femoral vein: Secondary | ICD-10-CM | POA: Insufficient documentation

## 2017-05-13 DIAGNOSIS — F17210 Nicotine dependence, cigarettes, uncomplicated: Secondary | ICD-10-CM | POA: Diagnosis not present

## 2017-05-13 DIAGNOSIS — N182 Chronic kidney disease, stage 2 (mild): Secondary | ICD-10-CM | POA: Diagnosis not present

## 2017-05-13 DIAGNOSIS — R2242 Localized swelling, mass and lump, left lower limb: Secondary | ICD-10-CM | POA: Diagnosis present

## 2017-05-13 DIAGNOSIS — I82442 Acute embolism and thrombosis of left tibial vein: Secondary | ICD-10-CM

## 2017-05-13 DIAGNOSIS — Z79899 Other long term (current) drug therapy: Secondary | ICD-10-CM | POA: Diagnosis not present

## 2017-05-13 DIAGNOSIS — M7989 Other specified soft tissue disorders: Secondary | ICD-10-CM

## 2017-05-13 MED ORDER — RIVAROXABAN (XARELTO) VTE STARTER PACK (15 & 20 MG): ORAL_TABLET | ORAL | 0 refills | Status: DC

## 2017-05-13 MED ORDER — RIVAROXABAN 15 MG PO TABS
ORAL_TABLET | ORAL | Status: AC
  Filled 2017-05-13: qty 1

## 2017-05-13 MED ORDER — RIVAROXABAN 15 MG PO TABS
15.0000 mg | ORAL_TABLET | Freq: Once | ORAL | Status: AC
  Administered 2017-05-13: 15 mg via ORAL
  Filled 2017-05-13: qty 1

## 2017-05-13 NOTE — ED Notes (Signed)
disregard previous note about not taking med.

## 2017-05-13 NOTE — ED Triage Notes (Signed)
Pt went for ultrasound today to left leg and was sent to ED due to positive for DVT. Pt reports that he had been having pain in left leg for over 1 1/2 week

## 2017-05-13 NOTE — ED Provider Notes (Signed)
[facility] DEPT Provider Note   CSN: [id number] Arrival date & time: 05/13/17  1701  By signing my name below, I, Sonum Patel, attest that this documentation has been prepared under the direction and in the presence of Virgel Manifold, MD. Electronically Signed: Sonum Patel, Education administrator. 05/13/17. 6:36 PM.  History   Chief Complaint Chief Complaint  Patient presents with  . DVT    The history is provided by the patient. No language interpreter was used.     HPI Comments: David [name] is a 61 y.o. male who presents to the Emergency Department complaining of constant, worsening left lower leg swelling with associated pain for the past 1.5 weeks. He had an [place] earlier today which was positive for a DVT. He has SOB but states he is a daily smoker and does not notice a worsening in his breathing. He denies a prior history of blood clots or cancer. He denies recent surgeries or long distance travel.   Past Medical History:  Diagnosis Date  . Anxiety   . COPD (chronic obstructive pulmonary disease) ([place])   . GERD (gastroesophageal reflux disease)   . Pneumonia   . Schizophrenia [id number])     Patient Active Problem List   Diagnosis Date Noted  . Thrombocytopenia ([place]) 04/03/2017  . Essential hypertension 02/15/2016  . Schizophrenia ([place])   . Hyperprolactinemia ([place]) 12/07/2015  . History of idiopathic thrombocytopenic purpura 12/05/2015  . Paranoid schizophrenia ([place]) 12/03/2015  . GERD (gastroesophageal reflux disease) 03/04/2012  . Insomnia 03/04/2012  . CKD (chronic kidney disease) stage 2, GFR 60-89 ml/min 03/04/2012  . Schizophrenia, paranoid, chronic ([place]) 02/18/2012    Past Surgical History:  Procedure Laterality Date  . TONSILLECTOMY         Home Medications    Prior to Admission medications   Medication Sig Start Date End Date Taking? Authorizing Provider  benztropine (COGENTIN) 0.5 MG tablet Take 1 tablet (0.5 mg total) by mouth at bedtime. 12/30/15   Withrow, Elyse Jarvis, FNP  gabapentin (NEURONTIN) 300 MG capsule Take 2 capsules (600 mg total) by mouth at bedtime. Patient taking differently: Take 600 mg by mouth 2 (two) times daily.  12/30/15   Withrow, Elyse Jarvis, FNP  haloperidol (HALDOL) 5 MG tablet Take 10 mg by mouth at bedtime.     [provider]  hydrOXYzine (ATARAX/VISTARIL) 25 MG tablet Take 1 tablet (25 mg total) by mouth at bedtime as needed (insomnia). 12/30/15   Withrow, Elyse Jarvis, FNP  losartan (COZAAR) 50 MG tablet TAKE 1 TABLET (50 MG TOTAL) BY MOUTH DAILY. 04/10/17   Claretta Fraise, MD  pantoprazole (PROTONIX) 40 MG tablet TAKE 1 TABLET (40 MG TOTAL) BY MOUTH DAILY. 01/17/17   Claretta Fraise, MD  zolpidem (AMBIEN) 5 MG tablet Take 5 mg by mouth at bedtime as needed for sleep.    [provider]    Family History Family History  Problem Relation Age of Onset  . Arthritis Mother   . Hypertension Mother     Social History Social History  Substance Use Topics  . Smoking status: Current Every Day Smoker    Packs/day: 1.50    Years: 40.00    Types: Cigarettes    Start date: 04/14/1974  . Smokeless tobacco: Never Used  . Alcohol use 3.6 oz/week    6 Cans of beer per week     Allergies   Lithium; Trazodone and nefazodone; and Trazodone   Review of Systems Review of Systems  All other systems reviewed and  are negative for acute change except as noted in the HPI.   Physical Exam Updated Vital Signs BP (!) 162/74 (BP Location: Right Arm)   Pulse 97   Temp 98 F (36.7 C) (Oral)   Resp 18   Ht 6' (1.829 m)   Wt 205 lb (93 kg)   SpO2 96%   BMI 27.80 kg/m   Physical Exam  Constitutional: He is oriented to person, place, and time. He appears well-developed and well-nourished.  HENT:  Head: Normocephalic and atraumatic.  Eyes: EOM are normal.  Neck: Normal range of motion.  Cardiovascular: Normal rate, regular rhythm, normal heart sounds and intact distal pulses.   Pulmonary/Chest: Effort normal and breath  sounds normal. No respiratory distress.  Musculoskeletal: Normal range of motion. He exhibits edema.  Tense swelling distal to left knee. Palpable DP pulse. Good cap refill in left great toe  Neurological: He is alert and oriented to person, place, and time.  Skin: Skin is warm and dry. Capillary refill takes less than 2 seconds.  Psychiatric: He has a normal mood and affect. Judgment normal.  Nursing note and vitals reviewed.    ED Treatments / Results  DIAGNOSTIC STUDIES: Oxygen Saturation is 96% on RA, adequate by my interpretation.    COORDINATION OF CARE: 6:31 PM Discussed treatment plan with pt at bedside and pt agreed to plan.   Labs (all labs ordered are listed, but only abnormal results are displayed) Labs Reviewed - No data to display  EKG  EKG Interpretation None       Radiology US Venous Img Lower Unilateral Left  Result Date: 05/13/2017 CLINICAL DATA:  Acute left lower extremity pain and swelling. EXAM: Left LOWER EXTREMITY VENOUS DOPPLER ULTRASOUND TECHNIQUE: Gray-scale sonography with graded compression, as well as color Doppler and duplex ultrasound were performed to evaluate the lower extremity deep venous systems from the level of the common femoral vein and including the common femoral, femoral, profunda femoral, popliteal and calf veins including the posterior tibial, peroneal and gastrocnemius veins when visible. The superficial great saphenous vein was also interrogated. Spectral Doppler was utilized to evaluate flow at rest and with distal augmentation maneuvers in the common femoral, femoral and popliteal veins. COMPARISON:  None. FINDINGS: Contralateral Common Femoral Vein: Respiratory phasicity is normal and symmetric with the symptomatic side. No evidence of thrombus. Normal compressibility. Common Femoral Vein: No evidence of thrombus. Normal compressibility, respiratory phasicity and response to augmentation. Saphenofemoral Junction: No evidence of  thrombus. Normal compressibility and flow on color Doppler imaging. Profunda Femoral Vein: Noncompressible with no flow consistent with occlusive thrombus. Femoral Vein: Noncompressible with no flow consistent with occlusive thrombosis. Popliteal Vein: Noncompressible with no flow consistent with occlusive thrombosis. Calf Veins: Noncompressive with no flow consistent with occlusive thrombosis. Venous Reflux:  None. Other Findings:  None. IMPRESSION: Acute occlusive deep venous thrombosis is seen involving the left profunda femoral, superficial femoral, popliteal, posterior tibial and peroneal veins. Critical Value/emergent results were called by telephone at the time of interpretation on 05/13/2017 at 4:57 pm to Dr. Caryl Pina , who verbally acknowledged these results. Electronically Signed   By: Marijo Conception, M.D.   On: 05/13/2017 16:57    Procedures Procedures (including critical care time)  Medications Ordered in ED Medications - No data to display   Initial Impression / Assessment and Plan / ED Course  I have reviewed the triage vital signs and the nursing notes.  Pertinent labs & imaging results that were available during my  care of the patient were reviewed by me and considered in my medical decision making (see chart for details).     55yM with DVT w/ signs/symptoms of PE. Started on xarleto. Return precautions discussed. PCP FU.   Final Clinical Impressions(s) / ED Diagnoses   Final diagnoses:  Acute deep vein thrombosis (DVT) of proximal vein of left lower extremity (HCC)    New Prescriptions New Prescriptions   No medications on file   I personally preformed the services scribed in my presence. The recorded information has been reviewed is accurate. Virgel Manifold, MD.     Virgel Manifold, MD 05/15/17 2019

## 2017-05-13 NOTE — Progress Notes (Signed)
BP 121/79   Pulse 88   Temp 97 F (36.1 C) (Oral)   Ht 6' (1.829 m)   Wt 206 lb (93.4 kg)   BMI 27.94 kg/m    Subjective:    Patient ID: David [name], male    DOB: [birth year], 61 y.o.   MRN: [id number]  HPI: David [name] is a 61 y.o. male presenting on 05/13/2017 for Pain and swelling in left foot/ankle/calf (x 2 weeks)   HPI Left leg swelling Patient has new left leg swelling in his ankle calf and foot that has been going on for the past 2 weeks. He thinks he may have had one episode like this in the past but not as significant. He denies any fevers or chills or redness or warmth. He did have some pain initially when it's swollen up but now it is not that painful. He denies any difficulty ambulation. He denies any swelling anywhere else. Recently on labs we can see that his creatinines were trending up but had still not made significant changes. We will recheck those today. He denies any difficulty urinating.  Relevant past medical, surgical, family and social history reviewed and updated as indicated. Interim medical history since our last visit reviewed. Allergies and medications reviewed and updated.  Review of Systems  Constitutional: Negative for chills and fever.  Respiratory: Negative for shortness of breath and wheezing.   Cardiovascular: Positive for leg swelling (2+ pitting edema in left lower extremity). Negative for chest pain.  Musculoskeletal: Negative for back pain and gait problem.  Skin: Negative for color change and rash.  Neurological: Negative for dizziness, light-headedness and headaches.  All other systems reviewed and are negative.   Per HPI unless specifically indicated above        Objective:    BP 121/79   Pulse 88   Temp 97 F (36.1 C) (Oral)   Ht 6' (1.829 m)   Wt 206 lb (93.4 kg)   BMI 27.94 kg/m   Wt Readings from Last 3 Encounters:  05/13/17 206 lb (93.4 kg)  04/03/17 210 lb 9.6 oz (95.5 kg)  10/10/16 209 lb 8 oz (95 kg)      Physical Exam  Constitutional: He is oriented to person, place, and time. He appears well-developed and well-nourished. No distress.  Eyes: Conjunctivae are normal. No scleral icterus.  Cardiovascular: Normal rate, regular rhythm, normal heart sounds and intact distal pulses.   No murmur heard. Pulmonary/Chest: Effort normal and breath sounds normal. No respiratory distress. He has no wheezes. He has no rales.  Musculoskeletal: Normal range of motion. He exhibits edema (2+ pitting edema in left lower extremity below mid calf. Some mid calf tenderness.).  Neurological: He is alert and oriented to person, place, and time. Coordination normal.  Skin: Skin is warm and dry. No rash noted. He is not diaphoretic.  Psychiatric: He has a normal mood and affect. His behavior is normal.  Nursing note and vitals reviewed.       Assessment & Plan:   Problem List Items Addressed This Visit    None    Visit Diagnoses    Left leg swelling    -  Primary   Relevant Orders   US Venous Img Lower Unilateral Left   CMP14+EGFR   TSH       Follow up plan: Return in about 1 week (around 05/20/2017), or if symptoms worsen or fail to improve, for Follow-up leg swelling.  Counseling provided for all of the vaccine  components Orders Placed This Encounter  Procedures  . US Venous Img Lower Unilateral Left  . CMP14+EGFR  . TSH    Caryl Pina, MD [facility] 05/13/2017, 3:28 PM

## 2017-05-13 NOTE — ED Notes (Signed)
Pt refused to stay but stated that he will go the pharmacy.

## 2017-05-14 ENCOUNTER — Other Ambulatory Visit: Payer: Self-pay | Admitting: Family Medicine

## 2017-05-14 LAB — CMP14+EGFR
A/G RATIO: 1.5 (ref 1.2–2.2)
ALK PHOS: 125 IU/L — AB (ref 39–117)
ALT: 8 IU/L (ref 0–44)
AST: 12 IU/L (ref 0–40)
Albumin: 3.6 g/dL (ref 3.5–5.5)
BILIRUBIN TOTAL: 0.3 mg/dL (ref 0.0–1.2)
BUN/Creatinine Ratio: 6 — ABNORMAL LOW (ref 9–20)
BUN: 9 mg/dL (ref 6–24)
CHLORIDE: 97 mmol/L (ref 96–106)
CO2: 27 mmol/L (ref 18–29)
Calcium: 8.5 mg/dL — ABNORMAL LOW (ref 8.7–10.2)
Creatinine, Ser: 1.57 mg/dL — ABNORMAL HIGH (ref 0.76–1.27)
GFR calc Af Amer: 55 mL/min/{1.73_m2} — ABNORMAL LOW (ref >59)
GFR calc non Af Amer: 48 mL/min/{1.73_m2} — ABNORMAL LOW (ref >59)
Globulin, Total: 2.4 g/dL (ref 1.5–4.5)
Glucose: 91 mg/dL (ref 65–99)
POTASSIUM: 4.5 mmol/L (ref 3.5–5.2)
SODIUM: 141 mmol/L (ref 134–144)
Total Protein: 6 g/dL (ref 6.0–8.5)

## 2017-05-14 LAB — TSH: TSH: 0.953 u[IU]/mL (ref 0.450–4.500)

## 2017-05-15 ENCOUNTER — Other Ambulatory Visit: Payer: Self-pay | Admitting: *Deleted

## 2017-05-23 ENCOUNTER — Telehealth: Payer: Self-pay | Admitting: Family Medicine

## 2017-05-23 NOTE — Telephone Encounter (Signed)
Offered appt today but pt declined saying he couldn't come in today but accepted appt tomorrow with Dr Livia Snellen at 10:10. Advised pt to go to ED if he started having SOB or symptoms got worse. Pt voiced understanding.

## 2017-05-24 ENCOUNTER — Encounter: Payer: Self-pay | Admitting: Family Medicine

## 2017-05-24 ENCOUNTER — Ambulatory Visit (INDEPENDENT_AMBULATORY_CARE_PROVIDER_SITE_OTHER): Payer: Medicare HMO | Admitting: Family Medicine

## 2017-05-24 DIAGNOSIS — I82402 Acute embolism and thrombosis of unspecified deep veins of left lower extremity: Secondary | ICD-10-CM | POA: Diagnosis not present

## 2017-05-24 DIAGNOSIS — I82409 Acute embolism and thrombosis of unspecified deep veins of unspecified lower extremity: Secondary | ICD-10-CM | POA: Insufficient documentation

## 2017-05-24 NOTE — Progress Notes (Signed)
Subjective:  Patient ID: David [name], male    DOB: [birth year]  Age: 61 y.o. MRN: [id number]  CC: Hospitalization Follow-up (pt here today following up after being hospitalized for DVT, and he is c/o left lower leg swelling.)   HPI David [name] presents for Continued swelling. He's only been on the blood thinner for about a week. Says he is taking it twice a day. Denies any side effects. Denies easy bleeding. He says there is still some discomfort but Excedrin does relieve that. Hospital report reviewed showing patient started on Xarelto for DVT noted on Doppler ultrasound left lower extremity on 05/13/2017   History Jassiah has a past medical history of Anxiety; COPD (chronic obstructive pulmonary disease) ([place]); GERD (gastroesophageal reflux disease); Pneumonia; and Schizophrenia ([place]).   He has a past surgical history that includes Tonsillectomy.   His family history includes Arthritis in his mother; Hypertension in his mother.He reports that he has been smoking Cigarettes.  He started smoking about 43 years ago. He has a 60.00 pack-year smoking history. He has never used smokeless tobacco. He reports that he drinks about 3.6 oz of alcohol per week . He reports that he uses drugs, including Marijuana.    ROS Review of Systems  Constitutional: Negative for chills, diaphoresis and fever.  HENT: Negative for rhinorrhea and sore throat.   Respiratory: Negative for cough and shortness of breath.   Cardiovascular: Positive for leg swelling. Negative for chest pain.  Gastrointestinal: Negative for abdominal pain.  Musculoskeletal: Negative for arthralgias and myalgias.  Skin: Negative for rash.  Neurological: Negative for weakness and headaches.    Objective:  BP 116/72   Pulse 97   Temp 98.5 F (36.9 C) (Oral)   Ht 6' (1.829 m)   Wt 208 lb (94.3 kg)   BMI 28.21 kg/m   BP Readings from Last 3 Encounters:  05/24/17 116/72  05/13/17 (!) 150/70  05/13/17 121/79    Wt Readings  from Last 3 Encounters:  05/24/17 208 lb (94.3 kg)  05/13/17 205 lb (93 kg)  05/13/17 206 lb (93.4 kg)     Physical Exam  Constitutional: He appears well-developed and well-nourished.  HENT:  Head: Normocephalic and atraumatic.  Right Ear: Tympanic membrane and external ear normal. No decreased hearing is noted.  Left Ear: Tympanic membrane and external ear normal. No decreased hearing is noted.  Mouth/Throat: No oropharyngeal exudate or posterior oropharyngeal erythema.  Eyes: Pupils are equal, round, and reactive to light.  Neck: Normal range of motion. Neck supple.  Cardiovascular: Normal rate and regular rhythm.   No murmur heard. Pulmonary/Chest: Breath sounds normal. No respiratory distress.  Musculoskeletal: Normal range of motion. He exhibits edema (3+ to the left tibial tuberosity.. None RLE).  Vitals reviewed.     Assessment & Plan:   Eliam was seen today for hospitalization follow-up.  Diagnoses and all orders for this visit:  Acute deep vein thrombosis (DVT) of left lower extremity, unspecified vein (HCC) -     CBC with Differential/Platelet -     CMP14+EGFR       I am having Mr. Zhang maintain his benztropine, gabapentin, hydrOXYzine, zolpidem, haloperidol, losartan, Rivaroxaban, pantoprazole, QUEtiapine, and zolpidem.  Allergies as of 05/24/2017      Reactions   Lithium Nausea Only, Nausea And Vomiting, Other (See Comments)   "get's very sick"   Trazodone And Nefazodone Nausea And Vomiting   Trazodone Nausea And Vomiting      Medication List  Accurate as of 05/24/17 11:10 AM. Always use your most recent med list.          benztropine 0.5 MG tablet Commonly known as:  COGENTIN Take 1 tablet (0.5 mg total) by mouth at bedtime.   gabapentin 300 MG capsule Commonly known as:  NEURONTIN Take 2 capsules (600 mg total) by mouth at bedtime.   haloperidol 5 MG tablet Commonly known as:  HALDOL Take 10 mg by mouth at bedtime.   hydrOXYzine  25 MG tablet Commonly known as:  ATARAX/VISTARIL Take 1 tablet (25 mg total) by mouth at bedtime as needed (insomnia).   losartan 50 MG tablet Commonly known as:  COZAAR TAKE 1 TABLET (50 MG TOTAL) BY MOUTH DAILY.   pantoprazole 40 MG tablet Commonly known as:  PROTONIX TAKE 1 TABLET (40 MG TOTAL) BY MOUTH DAILY.   QUEtiapine 25 MG tablet Commonly known as:  SEROQUEL   Rivaroxaban 15 & 20 MG Tbpk Take as directed on package: Start with one 36m tablet by mouth twice a day with food. On Day 22, switch to one 281mtablet once a day with food.   zolpidem 5 MG tablet Commonly known as:  AMBIEN Take 5 mg by mouth at bedtime as needed for sleep.   zolpidem 10 MG tablet Commonly known as:  AMBIEN        Follow-up: Return in about 2 weeks (around 06/07/2017).  WaClaretta FraiseM.D.

## 2017-05-25 LAB — CMP14+EGFR
A/G RATIO: 1.4 (ref 1.2–2.2)
ALT: 16 IU/L (ref 0–44)
AST: 27 IU/L (ref 0–40)
Albumin: 3.5 g/dL (ref 3.5–5.5)
Alkaline Phosphatase: 119 IU/L — ABNORMAL HIGH (ref 39–117)
BUN/Creatinine Ratio: 8 — ABNORMAL LOW (ref 9–20)
BUN: 14 mg/dL (ref 6–24)
Bilirubin Total: 0.6 mg/dL (ref 0.0–1.2)
CALCIUM: 9.2 mg/dL (ref 8.7–10.2)
CO2: 29 mmol/L (ref 18–29)
CREATININE: 1.75 mg/dL — AB (ref 0.76–1.27)
Chloride: 97 mmol/L (ref 96–106)
GFR calc Af Amer: 48 mL/min/{1.73_m2} — ABNORMAL LOW (ref >59)
GFR, EST NON AFRICAN AMERICAN: 42 mL/min/{1.73_m2} — AB (ref >59)
GLOBULIN, TOTAL: 2.5 g/dL (ref 1.5–4.5)
Glucose: 99 mg/dL (ref 65–99)
POTASSIUM: 5 mmol/L (ref 3.5–5.2)
Sodium: 139 mmol/L (ref 134–144)
TOTAL PROTEIN: 6 g/dL (ref 6.0–8.5)

## 2017-05-25 LAB — CBC WITH DIFFERENTIAL/PLATELET
BASOS: 1 %
Basophils Absolute: 0.1 10*3/uL (ref 0.0–0.2)
EOS (ABSOLUTE): 1.2 10*3/uL — ABNORMAL HIGH (ref 0.0–0.4)
Eos: 9 %
HEMATOCRIT: 38.9 % (ref 37.5–51.0)
Hemoglobin: 11.7 g/dL — ABNORMAL LOW (ref 13.0–17.7)
IMMATURE GRANS (ABS): 0 10*3/uL (ref 0.0–0.1)
Immature Granulocytes: 0 %
LYMPHS: 10 %
Lymphocytes Absolute: 1.3 10*3/uL (ref 0.7–3.1)
MCH: 24.9 pg — ABNORMAL LOW (ref 26.6–33.0)
MCHC: 30.1 g/dL — ABNORMAL LOW (ref 31.5–35.7)
MCV: 83 fL (ref 79–97)
MONOCYTES: 9 %
Monocytes Absolute: 1.2 10*3/uL — ABNORMAL HIGH (ref 0.1–0.9)
NEUTROS ABS: 9.5 10*3/uL — AB (ref 1.4–7.0)
Neutrophils: 71 %
PLATELETS: 98 10*3/uL — AB (ref 150–379)
RBC: 4.69 x10E6/uL (ref 4.14–5.80)
RDW: 16.5 % — ABNORMAL HIGH (ref 12.3–15.4)
WBC: 13.3 10*3/uL — ABNORMAL HIGH (ref 3.4–10.8)

## 2017-06-07 ENCOUNTER — Encounter: Payer: Self-pay | Admitting: Family Medicine

## 2017-06-07 ENCOUNTER — Ambulatory Visit (INDEPENDENT_AMBULATORY_CARE_PROVIDER_SITE_OTHER): Payer: Medicare HMO | Admitting: Family Medicine

## 2017-06-07 VITALS — BP 132/82 | HR 97 | Temp 97.8°F | Ht 72.0 in | Wt 206.0 lb

## 2017-06-07 DIAGNOSIS — I82402 Acute embolism and thrombosis of unspecified deep veins of left lower extremity: Secondary | ICD-10-CM

## 2017-06-09 MED ORDER — RIVAROXABAN 20 MG PO TABS: 20.0000 mg | ORAL_TABLET | Freq: Every day | ORAL | 2 refills | Status: DC

## 2017-06-09 NOTE — Progress Notes (Signed)
  Chief Complaint  Patient presents with  . Followup DVT    HPI  Patient presents today for Recheck of his DVT. He is still having a good bit of swelling. However, there is less tense edema noted.  PMH: Smoking status noted ROS: Per HPI  Objective: BP 132/82   Pulse 97   Temp 97.8 F (36.6 C) (Oral)   Ht 6' (1.829 m)   Wt 206 lb (93.4 kg)   BMI 27.94 kg/m  Gen: NAD, alert, cooperative with exam HEENT: NCAT, EOMI, PERRL CV: RRR, good S1/S2, no murmur Resp: CTABL, no wheezes, non-labored Abd: SNTND, BS present, no guarding or organomegaly Ext: Moderate right lower extremity edema. There is some tension noted at the medial calf but not at the proximal leg. Neuro: Alert and oriented, No gross deficits  Assessment and plan:  1. Acute deep vein thrombosis (DVT) of left lower extremity, unspecified vein (HCC)     Meds ordered this encounter  Medications  . rivaroxaban (XARELTO) 20 MG TABS tablet    Sig: Take 1 tablet (20 mg total) by mouth daily with supper.    Dispense:  30 tablet    Refill:  2    Follow up 2 months.  Claretta Fraise, MD

## 2017-06-25 ENCOUNTER — Inpatient Hospital Stay (HOSPITAL_COMMUNITY): Payer: Medicare HMO

## 2017-06-25 ENCOUNTER — Encounter (HOSPITAL_COMMUNITY): Payer: Self-pay | Admitting: Emergency Medicine

## 2017-06-25 ENCOUNTER — Inpatient Hospital Stay (HOSPITAL_COMMUNITY)
Admission: EM | Admit: 2017-06-25 | Discharge: 2017-06-27 | DRG: 194 | Disposition: A | Discharge status: Home Health | Payer: Medicare HMO | Attending: Internal Medicine | Admitting: Internal Medicine

## 2017-06-25 ENCOUNTER — Emergency Department (HOSPITAL_COMMUNITY): Payer: Medicare HMO

## 2017-06-25 DIAGNOSIS — Z7901 Long term (current) use of anticoagulants: Secondary | ICD-10-CM | POA: Diagnosis not present

## 2017-06-25 DIAGNOSIS — K219 Gastro-esophageal reflux disease without esophagitis: Secondary | ICD-10-CM | POA: Diagnosis present

## 2017-06-25 DIAGNOSIS — N183 Chronic kidney disease, stage 3 unspecified: Secondary | ICD-10-CM

## 2017-06-25 DIAGNOSIS — W19XXXA Unspecified fall, initial encounter: Secondary | ICD-10-CM

## 2017-06-25 DIAGNOSIS — F17200 Nicotine dependence, unspecified, uncomplicated: Secondary | ICD-10-CM | POA: Diagnosis present

## 2017-06-25 DIAGNOSIS — Z8249 Family history of ischemic heart disease and other diseases of the circulatory system: Secondary | ICD-10-CM | POA: Diagnosis not present

## 2017-06-25 DIAGNOSIS — W1830XA Fall on same level, unspecified, initial encounter: Secondary | ICD-10-CM | POA: Diagnosis present

## 2017-06-25 DIAGNOSIS — Y92009 Unspecified place in unspecified non-institutional (private) residence as the place of occurrence of the external cause: Secondary | ICD-10-CM

## 2017-06-25 DIAGNOSIS — F200 Paranoid schizophrenia: Secondary | ICD-10-CM | POA: Diagnosis present

## 2017-06-25 DIAGNOSIS — I10 Essential (primary) hypertension: Secondary | ICD-10-CM | POA: Diagnosis not present

## 2017-06-25 DIAGNOSIS — J449 Chronic obstructive pulmonary disease, unspecified: Secondary | ICD-10-CM | POA: Diagnosis present

## 2017-06-25 DIAGNOSIS — F17210 Nicotine dependence, cigarettes, uncomplicated: Secondary | ICD-10-CM | POA: Diagnosis not present

## 2017-06-25 DIAGNOSIS — J189 Pneumonia, unspecified organism: Secondary | ICD-10-CM | POA: Diagnosis present

## 2017-06-25 DIAGNOSIS — S92511A Displaced fracture of proximal phalanx of right lesser toe(s), initial encounter for closed fracture: Secondary | ICD-10-CM | POA: Diagnosis not present

## 2017-06-25 DIAGNOSIS — S99921A Unspecified injury of right foot, initial encounter: Secondary | ICD-10-CM

## 2017-06-25 DIAGNOSIS — Z79899 Other long term (current) drug therapy: Secondary | ICD-10-CM | POA: Diagnosis not present

## 2017-06-25 DIAGNOSIS — D696 Thrombocytopenia, unspecified: Secondary | ICD-10-CM | POA: Diagnosis not present

## 2017-06-25 DIAGNOSIS — J440 Chronic obstructive pulmonary disease with acute lower respiratory infection: Secondary | ICD-10-CM | POA: Diagnosis present

## 2017-06-25 DIAGNOSIS — I82409 Acute embolism and thrombosis of unspecified deep veins of unspecified lower extremity: Secondary | ICD-10-CM | POA: Diagnosis present

## 2017-06-25 DIAGNOSIS — R251 Tremor, unspecified: Secondary | ICD-10-CM | POA: Diagnosis not present

## 2017-06-25 DIAGNOSIS — I6789 Other cerebrovascular disease: Secondary | ICD-10-CM | POA: Diagnosis not present

## 2017-06-25 DIAGNOSIS — R05 Cough: Secondary | ICD-10-CM | POA: Diagnosis not present

## 2017-06-25 DIAGNOSIS — M25571 Pain in right ankle and joints of right foot: Secondary | ICD-10-CM | POA: Diagnosis not present

## 2017-06-25 DIAGNOSIS — Z888 Allergy status to other drugs, medicaments and biological substances status: Secondary | ICD-10-CM

## 2017-06-25 DIAGNOSIS — Z86718 Personal history of other venous thrombosis and embolism: Secondary | ICD-10-CM

## 2017-06-25 DIAGNOSIS — I129 Hypertensive chronic kidney disease with stage 1 through stage 4 chronic kidney disease, or unspecified chronic kidney disease: Secondary | ICD-10-CM | POA: Diagnosis not present

## 2017-06-25 DIAGNOSIS — S92911A Unspecified fracture of right toe(s), initial encounter for closed fracture: Secondary | ICD-10-CM

## 2017-06-25 DIAGNOSIS — I82403 Acute embolism and thrombosis of unspecified deep veins of lower extremity, bilateral: Secondary | ICD-10-CM | POA: Diagnosis not present

## 2017-06-25 DIAGNOSIS — R296 Repeated falls: Secondary | ICD-10-CM | POA: Diagnosis not present

## 2017-06-25 DIAGNOSIS — Z9181 History of falling: Secondary | ICD-10-CM | POA: Diagnosis not present

## 2017-06-25 HISTORY — DX: Nicotine dependence, unspecified, uncomplicated: F17.200

## 2017-06-25 LAB — PHOSPHORUS: Phosphorus: 4.1 mg/dL (ref 2.5–4.6)

## 2017-06-25 LAB — COMPREHENSIVE METABOLIC PANEL
ALT: 10 U/L — AB (ref 17–63)
AST: 17 U/L (ref 15–41)
Albumin: 3 g/dL — ABNORMAL LOW (ref 3.5–5.0)
Alkaline Phosphatase: 84 U/L (ref 38–126)
Anion gap: 8 (ref 5–15)
BUN: 12 mg/dL (ref 6–20)
CHLORIDE: 98 mmol/L — AB (ref 101–111)
CO2: 30 mmol/L (ref 22–32)
CREATININE: 1.79 mg/dL — AB (ref 0.61–1.24)
Calcium: 8.4 mg/dL — ABNORMAL LOW (ref 8.9–10.3)
GFR calc non Af Amer: 40 mL/min — ABNORMAL LOW (ref >60)
GFR, EST AFRICAN AMERICAN: 46 mL/min — AB (ref >60)
Glucose, Bld: 97 mg/dL (ref 65–99)
POTASSIUM: 4.7 mmol/L (ref 3.5–5.1)
SODIUM: 136 mmol/L (ref 135–145)
Total Bilirubin: 0.7 mg/dL (ref 0.3–1.2)
Total Protein: 5.8 g/dL — ABNORMAL LOW (ref 6.5–8.1)

## 2017-06-25 LAB — CBC WITH DIFFERENTIAL/PLATELET
Basophils Absolute: 0.1 10*3/uL (ref 0.0–0.1)
Basophils Relative: 0 %
EOS PCT: 1 %
Eosinophils Absolute: 0.2 10*3/uL (ref 0.0–0.7)
HEMATOCRIT: 35.3 % — AB (ref 39.0–52.0)
Hemoglobin: 10.6 g/dL — ABNORMAL LOW (ref 13.0–17.0)
LYMPHS ABS: 1.3 10*3/uL (ref 0.7–4.0)
LYMPHS PCT: 6 %
MCH: 24.8 pg — ABNORMAL LOW (ref 26.0–34.0)
MCHC: 30 g/dL (ref 30.0–36.0)
MCV: 82.7 fL (ref 78.0–100.0)
MONO ABS: 1.5 10*3/uL — AB (ref 0.1–1.0)
Monocytes Relative: 7 %
NEUTROS ABS: 18.1 10*3/uL — AB (ref 1.7–7.7)
Neutrophils Relative %: 86 %
PLATELETS: 105 10*3/uL — AB (ref 150–400)
RBC: 4.27 MIL/uL (ref 4.22–5.81)
RDW: 18.7 % — AB (ref 11.5–15.5)
WBC: 21.1 10*3/uL — ABNORMAL HIGH (ref 4.0–10.5)

## 2017-06-25 LAB — URINALYSIS, ROUTINE W REFLEX MICROSCOPIC
BILIRUBIN URINE: NEGATIVE
Glucose, UA: NEGATIVE mg/dL
Hgb urine dipstick: NEGATIVE
Ketones, ur: NEGATIVE mg/dL
Leukocytes, UA: NEGATIVE
NITRITE: NEGATIVE
Protein, ur: NEGATIVE mg/dL
SPECIFIC GRAVITY, URINE: 1.009 (ref 1.005–1.030)
pH: 6 (ref 5.0–8.0)

## 2017-06-25 LAB — RAPID URINE DRUG SCREEN, HOSP PERFORMED
AMPHETAMINES: NOT DETECTED
BENZODIAZEPINES: NOT DETECTED
Barbiturates: NOT DETECTED
Cocaine: NOT DETECTED
OPIATES: NOT DETECTED
Tetrahydrocannabinol: POSITIVE — AB

## 2017-06-25 LAB — LACTIC ACID, PLASMA: Lactic Acid, Venous: 1.7 mmol/L (ref 0.5–1.9)

## 2017-06-25 LAB — MAGNESIUM: Magnesium: 1.9 mg/dL (ref 1.7–2.4)

## 2017-06-25 LAB — ETHANOL

## 2017-06-25 MED ORDER — CEFTRIAXONE SODIUM 1 G IJ SOLR
1.0000 g | INTRAMUSCULAR | Status: DC
  Filled 2017-06-25: qty 10

## 2017-06-25 MED ORDER — ACETAMINOPHEN 325 MG PO TABS
650.0000 mg | ORAL_TABLET | Freq: Four times a day (QID) | ORAL | Status: DC | PRN
  Administered 2017-06-27: 650 mg via ORAL
  Filled 2017-06-25: qty 2

## 2017-06-25 MED ORDER — ALBUTEROL SULFATE (2.5 MG/3ML) 0.083% IN NEBU: 2.5000 mg | INHALATION_SOLUTION | RESPIRATORY_TRACT | Status: DC | PRN

## 2017-06-25 MED ORDER — BENZTROPINE MESYLATE 1 MG PO TABS
0.5000 mg | ORAL_TABLET | Freq: Every day | ORAL | Status: DC
  Administered 2017-06-25 – 2017-06-26 (×2): 0.5 mg via ORAL
  Filled 2017-06-25 (×2): qty 1

## 2017-06-25 MED ORDER — OXYCODONE HCL 5 MG PO TABS: 5.0000 mg | ORAL_TABLET | Freq: Three times a day (TID) | ORAL | Status: DC | PRN

## 2017-06-25 MED ORDER — HALOPERIDOL 5 MG PO TABS
10.0000 mg | ORAL_TABLET | Freq: Every day | ORAL | Status: DC
  Administered 2017-06-25 – 2017-06-26 (×2): 10 mg via ORAL
  Filled 2017-06-25 (×2): qty 2

## 2017-06-25 MED ORDER — IPRATROPIUM-ALBUTEROL 0.5-2.5 (3) MG/3ML IN SOLN
3.0000 mL | Freq: Four times a day (QID) | RESPIRATORY_TRACT | Status: DC
  Administered 2017-06-25: 3 mL via RESPIRATORY_TRACT
  Filled 2017-06-25: qty 3

## 2017-06-25 MED ORDER — IPRATROPIUM-ALBUTEROL 0.5-2.5 (3) MG/3ML IN SOLN
3.0000 mL | Freq: Three times a day (TID) | RESPIRATORY_TRACT | Status: DC
  Administered 2017-06-26 – 2017-06-27 (×5): 3 mL via RESPIRATORY_TRACT
  Filled 2017-06-25 (×5): qty 3

## 2017-06-25 MED ORDER — ZOLPIDEM TARTRATE 5 MG PO TABS: 5.0000 mg | ORAL_TABLET | Freq: Every evening | ORAL | Status: DC | PRN

## 2017-06-25 MED ORDER — ALBUTEROL SULFATE (2.5 MG/3ML) 0.083% IN NEBU: 2.5000 mg | INHALATION_SOLUTION | Freq: Four times a day (QID) | RESPIRATORY_TRACT | Status: DC

## 2017-06-25 MED ORDER — IPRATROPIUM-ALBUTEROL 0.5-2.5 (3) MG/3ML IN SOLN
3.0000 mL | Freq: Once | RESPIRATORY_TRACT | Status: AC
  Administered 2017-06-25: 3 mL via RESPIRATORY_TRACT
  Filled 2017-06-25: qty 3

## 2017-06-25 MED ORDER — NICOTINE 21 MG/24HR TD PT24: 21.0000 mg | MEDICATED_PATCH | Freq: Every day | TRANSDERMAL | Status: DC | PRN

## 2017-06-25 MED ORDER — RIVAROXABAN 20 MG PO TABS
20.0000 mg | ORAL_TABLET | Freq: Every day | ORAL | Status: DC
  Administered 2017-06-25 – 2017-06-26 (×2): 20 mg via ORAL
  Filled 2017-06-25 (×2): qty 1

## 2017-06-25 MED ORDER — PANTOPRAZOLE SODIUM 40 MG PO TBEC
40.0000 mg | DELAYED_RELEASE_TABLET | Freq: Every day | ORAL | Status: DC
  Administered 2017-06-25 – 2017-06-27 (×3): 40 mg via ORAL
  Filled 2017-06-25 (×3): qty 1

## 2017-06-25 MED ORDER — GABAPENTIN 300 MG PO CAPS
600.0000 mg | ORAL_CAPSULE | Freq: Two times a day (BID) | ORAL | Status: DC
  Administered 2017-06-25 – 2017-06-27 (×4): 600 mg via ORAL
  Filled 2017-06-25 (×4): qty 2

## 2017-06-25 MED ORDER — HYDROXYZINE HCL 25 MG PO TABS: 25.0000 mg | ORAL_TABLET | Freq: Every evening | ORAL | Status: DC | PRN

## 2017-06-25 MED ORDER — SODIUM CHLORIDE 0.9 % IV BOLUS (SEPSIS): 1000.0000 mL | Freq: Once | INTRAVENOUS | Status: DC

## 2017-06-25 MED ORDER — ONDANSETRON HCL 4 MG PO TABS: 4.0000 mg | ORAL_TABLET | Freq: Four times a day (QID) | ORAL | Status: DC | PRN

## 2017-06-25 MED ORDER — QUETIAPINE FUMARATE 25 MG PO TABS
25.0000 mg | ORAL_TABLET | Freq: Two times a day (BID) | ORAL | Status: DC
  Administered 2017-06-25 – 2017-06-27 (×4): 25 mg via ORAL
  Filled 2017-06-25 (×4): qty 1

## 2017-06-25 MED ORDER — DEXTROSE 5 % IV SOLN
500.0000 mg | INTRAVENOUS | Status: DC
  Administered 2017-06-25: 500 mg via INTRAVENOUS
  Filled 2017-06-25 (×2): qty 500

## 2017-06-25 MED ORDER — DEXTROSE 5 % IV SOLN
1.0000 g | Freq: Once | INTRAVENOUS | Status: AC
  Administered 2017-06-25: 1 g via INTRAVENOUS
  Filled 2017-06-25: qty 10

## 2017-06-25 MED ORDER — SODIUM CHLORIDE 0.9 % IV SOLN
INTRAVENOUS | Status: AC
Start: 2017-06-25 — End: 2017-06-26
  Administered 2017-06-26: 02:00:00 via INTRAVENOUS

## 2017-06-25 MED ORDER — DEXTROSE 5 % IV SOLN
500.0000 mg | INTRAVENOUS | Status: DC
  Filled 2017-06-25: qty 500

## 2017-06-25 MED ORDER — ONDANSETRON HCL 4 MG/2ML IJ SOLN
4.0000 mg | Freq: Four times a day (QID) | INTRAMUSCULAR | Status: DC | PRN
  Administered 2017-06-26: 4 mg via INTRAVENOUS
  Filled 2017-06-25: qty 2

## 2017-06-25 MED ORDER — IPRATROPIUM BROMIDE 0.02 % IN SOLN: 0.5000 mg | Freq: Four times a day (QID) | RESPIRATORY_TRACT | Status: DC

## 2017-06-25 NOTE — H&P (Signed)
History and Physical    David [name] GXQ:[id number] DOB: [birth year] DOA: 06/25/2017  PCP: David Fraise, MD   Patient coming from: Home.  I have personally briefly reviewed patient's old medical records in [place]  Chief Complaint: Falls.  HPI: David [name] is a 61 y.o. male with medical history significant of anxiety, COPD, GERD, hypertension, history of pneumonia, tobacco use disorder, schizophrenia was coming to the emergency department with complaints of multiple falls today and involuntary jerking/tremors, associated with progressively worse weakness for the past week or 2. He has a chronic productive cough. He denies fever, chills, chest pain, diaphoresis, PND, orthopnea, abdominal pain, nausea, emesis, diarrhea, melena, constipation or hematochezia. He denies dysuria, frequency or hematuria. The patient's is disheveled, with poor personal hygiene and is a poor historian.  ED Course: Vital signs initially were 97.54F, pulse 94, blood pressure 109/67 mmHg, respirations 21 and O2 sat 92% on room air. Workup in the emergency department shows leukocytosis of 21.1 with 86% neutrophils, hemoglobin 10.6 g/dL and platelets 105. Urine toxicology was positive for THC and his ethanol level was less than 5 mg/dL. Gen. chemistry shows a creatinine of 1.79 mg/dL, but was otherwise normal. Lactic acid was 1.7 mmol/L.  Chest radiograph shows finding suspicious for left mid lung opacity, unable to exclude pneumonia. CT scan of the brain did not show any acute abnormalities. Please see images and full radiology report for further detail.  The patient received normal saline bolus, supplemental oxygen, nebulized bronchodilators, ceftriaxone and azithromycin in the emergency department.  Review of Systems: As per HPI otherwise 10 point review of systems negative.    Past Medical History:  Diagnosis Date  . Anxiety   . COPD (chronic obstructive pulmonary disease) ([place])   . GERD  (gastroesophageal reflux disease)   . Hypertension   . Pneumonia   . Schizophrenia ([place])   . Tobacco use disorder     Past Surgical History:  Procedure Laterality Date  . TONSILLECTOMY       reports that he has been smoking Cigarettes.  He started smoking about 43 years ago. He has a 60.00 pack-year smoking history. He has never used smokeless tobacco. He reports that he drinks about 3.6 oz of alcohol per week . He reports that he uses drugs, including Marijuana.  Allergies  Allergen Reactions  . Lithium Nausea Only, Nausea And Vomiting and Other (See Comments)    "get's very sick"  . Trazodone And Nefazodone Nausea And Vomiting  . Trazodone Nausea And Vomiting    Family History  Problem Relation Age of Onset  . Arthritis Mother   . Hypertension Mother     Prior to Admission medications   Medication Sig Start Date End Date Taking? Authorizing Provider  benztropine (COGENTIN) 0.5 MG tablet Take 1 tablet (0.5 mg total) by mouth at bedtime. 12/30/15  Yes Withrow, Elyse Jarvis, FNP  gabapentin (NEURONTIN) 300 MG capsule Take 2 capsules (600 mg total) by mouth at bedtime. Patient taking differently: Take 600 mg by mouth 2 (two) times daily.  12/30/15  Yes Withrow, Elyse Jarvis, FNP  haloperidol (HALDOL) 5 MG tablet Take 10 mg by mouth at bedtime.    Yes [provider]  losartan (COZAAR) 50 MG tablet TAKE 1 TABLET (50 MG TOTAL) BY MOUTH DAILY. 04/10/17  Yes Stacks, Cletus Gash, MD  pantoprazole (PROTONIX) 40 MG tablet TAKE 1 TABLET (40 MG TOTAL) BY MOUTH DAILY. 05/15/17  Yes Stacks, Cletus Gash, MD  QUEtiapine (SEROQUEL) 25 MG tablet 25 mg 2 (  two) times daily.  04/20/17  Yes [provider]  rivaroxaban (XARELTO) 20 MG TABS tablet Take 1 tablet (20 mg total) by mouth daily with supper. 06/09/17  Yes Stacks, Cletus Gash, MD  zolpidem (AMBIEN) 5 MG tablet Take 5 mg by mouth at bedtime as needed for sleep.   Yes [provider]  hydrOXYzine (ATARAX/VISTARIL) 25 MG tablet Take 1 tablet (25  mg total) by mouth at bedtime as needed (insomnia). Patient not taking: Reported on 06/25/2017 12/30/15   Benjamine Mola, FNP  zolpidem (AMBIEN) 10 MG tablet  04/20/17   [provider]    Physical Exam: Vitals:   06/25/17 1938 06/25/17 2000 06/25/17 2118 06/25/17 2303  BP:  129/65 122/74   Pulse:  93 87   Resp:  (!) 21 20   Temp:   98.4 F (36.9 C)   TempSrc:   Oral   SpO2: 98% 96% 95% 93%  Weight:   93.4 kg (205 lb 14.9 oz)   Height:   6' (1.829 m)     Constitutional: NAD, calm, comfortable Eyes: PERRL, lids and conjunctivae normal ENMT: Mucous membranes are moist. Posterior pharynx clear of any exudate or lesions. Neck: Normal, supple, no masses, no thyromegaly Respiratory: Bilateral rhonchi and wheezing, no crackles. Normal respiratory effort. No accessory muscle use.  Cardiovascular: Regular rate and rhythm, no murmurs / rubs / gallops. No extremity edema. 2+ pedal pulses. No carotid bruits.  Abdomen: no tenderness, no masses palpated. No hepatosplenomegaly. Bowel sounds positive.  Musculoskeletal: no clubbing / cyanosis. Good ROM, no contractures. Normal muscle tone.  Skin: no significant rashes, lesions, ulcers on limited skin exam. Neurologic: Positive intention movements tremors. CN 2-12 grossly intact. Grossly nonfocal. Psychiatric: Disheveled. Alert and oriented x 3.    Labs on Admission: I have personally reviewed following labs and imaging studies  CBC:  Recent Labs Lab 06/25/17 1321  WBC 21.1*  NEUTROABS 18.1*  HGB 10.6*  HCT 35.3*  MCV 82.7  PLT 025*   Basic Metabolic Panel:  Recent Labs Lab 06/25/17 1321 06/25/17 1330  NA 136  --   K 4.7  --   CL 98*  --   CO2 30  --   GLUCOSE 97  --   BUN 12  --   CREATININE 1.79*  --   CALCIUM 8.4*  --   MG  --  1.9  PHOS  --  4.1   GFR: Estimated Creatinine Clearance: 52.7 mL/min (A) (by C-G formula based on SCr of 1.79 mg/dL (H)). Liver Function Tests:  Recent Labs Lab 06/25/17 1321    AST 17  ALT 10*  ALKPHOS 84  BILITOT 0.7  PROT 5.8*  ALBUMIN 3.0*   No results for input(s): LIPASE, AMYLASE in the last 168 hours. No results for input(s): AMMONIA in the last 168 hours. Coagulation Profile: No results for input(s): INR, PROTIME in the last 168 hours. Cardiac Enzymes: No results for input(s): CKTOTAL, CKMB, CKMBINDEX, TROPONINI in the last 168 hours. BNP (last 3 results) No results for input(s): PROBNP in the last 8760 hours. HbA1C: No results for input(s): HGBA1C in the last 72 hours. CBG: No results for input(s): GLUCAP in the last 168 hours. Lipid Profile: No results for input(s): CHOL, HDL, LDLCALC, TRIG, CHOLHDL, LDLDIRECT in the last 72 hours. Thyroid Function Tests: No results for input(s): TSH, T4TOTAL, FREET4, T3FREE, THYROIDAB in the last 72 hours. Anemia Panel: No results for input(s): VITAMINB12, FOLATE, FERRITIN, TIBC, IRON, RETICCTPCT in the last 72 hours.  Urine analysis:    Component Value Date/Time   COLORURINE YELLOW 06/25/2017 [place] 06/25/2017 1345   LABSPEC 1.009 06/25/2017 1345   PHURINE 6.0 06/25/2017 1345   GLUCOSEU NEGATIVE 06/25/2017 1345   HGBUR NEGATIVE 06/25/2017 1345   BILIRUBINUR NEGATIVE 06/25/2017 1345   KETONESUR NEGATIVE 06/25/2017 1345   PROTEINUR NEGATIVE 06/25/2017 1345   UROBILINOGEN 0.2 01/03/2014 1807   NITRITE NEGATIVE 06/25/2017 1345   LEUKOCYTESUR NEGATIVE 06/25/2017 1345    Radiological Exams on Admission: Dg Chest 2 View  Result Date: 06/25/2017 CLINICAL DATA:  Cough EXAM: CHEST  2 VIEW COMPARISON:  12/02/2015 chest radiograph. FINDINGS: Stable cardiomediastinal silhouette with normal heart size. No pneumothorax. Chronic mild blunting of the bilateral costophrenic angles, suggesting chronic mild pleural-parenchymal scarring. No significant pleural effusions. There is hazy opacity in the peripheral left mid lung with associated faint air bronchograms. IMPRESSION: Hazy peripheral left  midlung opacity with associated faint air bronchograms, cannot exclude a pneumonia. Recommend follow-up PA and lateral post treatment chest radiographs in 4-6 weeks. Electronically Signed   By: Ilona Sorrel M.D.   On: 06/25/2017 14:09   Ct Head Wo Contrast  Result Date: 06/25/2017 CLINICAL DATA:  61 year old male with lower extremity swelling, multiple recent falls, diaphoretic, involuntary movements. EXAM: CT HEAD WITHOUT CONTRAST TECHNIQUE: Contiguous axial images were obtained from the base of the skull through the vertex without intravenous contrast. COMPARISON:  Head CT without contrast 12/01/2015. FINDINGS: Brain: Stable cerebral volume, within normal limits. Chronic perivascular space versus lacunar infarct along the medial right lentiform nuclei (series 2, image 17). Elsewhere gray-white matter differentiation is stable and within normal limits. No midline shift, ventriculomegaly, mass effect, evidence of mass lesion, intracranial hemorrhage or evidence of cortically based acute infarction. No cortical encephalomalacia identified. Vascular: Calcified atherosclerosis at the skull base. No suspicious intracranial vascular hyperdensity. Skull: Stable and intact.  No acute osseous abnormality identified. Sinuses/Orbits: Visualized paranasal sinuses and mastoids are stable and well pneumatized. Other: Visualized orbits and scalp soft tissues are within normal limits. IMPRESSION: 1.  No acute intracranial abnormality. 2. Stable non contrast CT appearance of the brain since 2016, negative except for a right lentiform chronic lacune versus perivascular space. Electronically Signed   By: Genevie Ann M.D.   On: 06/25/2017 14:05   Dg Foot 2 Views Right  Result Date: 06/25/2017 CLINICAL DATA:  Acute right foot pain following fall. Initial encounter. EXAM: RIGHT FOOT - 2 VIEW COMPARISON:  None. FINDINGS: A fracture of the little toe middle phalanx is noted. No other fracture, subluxation or dislocation noted. The  Lisfranc joints are unremarkable. Dorsal soft tissue swelling is present. IMPRESSION: Fracture of the little toe middle phalanx. Electronically Signed   By: Margarette Canada M.D.   On: 06/25/2017 20:19    EKG: Independently reviewed.  Vent. rate 93 BPM PR interval * ms QRS duration 91 ms QT/QTc 355/442 ms P-R-T axes 65 83 72 Sinus rhythm Low voltage, precordial leads  Assessment/Plan Principal Problem:   Community acquired pneumonia of left lung Admit to telemetry/inpatient. Continue supplemental oxygen. Continue gentle IV hydration. DuoNeb every 6 hours. Continue ceftriaxone 1 g IV PB every 24 hours. Continue azithromycin 500 mg IVPB every 24 hours. Follow-up blood cultures and sensitivity. Check sputum Gram stain, culture and sensitivity. Check HIV antibody. Check a strep pneumoniae urinary antigen.  Active Problems:   Schizophrenia, paranoid, chronic (HCC) Continue gabapentin 600 mg by twice a day. Continue Cogentin 0.5 mg by mouth at bedtime. Continue Haldol 5  mg by mouth at bedtime. Continue Seroquel 25 mg by mouth twice a day. Continue Ambien 5 mg by mouth at bedtime for insomnia.    GERD (gastroesophageal reflux disease) Pantoprazole 40 mg by mouth daily.    Essential hypertension Hold losartan for tonight. Follow-up blood pressure, renal function and electrolytes.    Thrombocytopenia (HCC) Monitor platelet count.    DVT (deep venous thrombosis) (HCC) Continue Xarelto 20 mg by mouth daily.    COPD (chronic obstructive pulmonary disease) (HCC) Continue supplemental oxygen. Continue nebulized bronchodilators.    Tobacco use disorder Nicotine replacement therapy offered, but declined. Tobacco cessation information to be provided.    Fracture of the little toe middle phalanx. Analgesics as needed. Consider podiatry consult in a.m.    Falls Consider MRI of brain and neurology consult if no improvement.    DVT prophylaxis: Lovenox SQ. Code Status: Full  code. Family Communication:  Disposition Plan: Admit for IV antibiotic therapy for several days. Consults called:  Admission status: Inpatient/telemetry.   Reubin Milan MD [facility] Pager [phone].  If 7PM-7AM, please contact night-coverage [email or web address] Password [email or web address]  06/25/2017, 11:15 PM

## 2017-06-25 NOTE — ED Notes (Signed)
edp in with pt 

## 2017-06-25 NOTE — ED Notes (Addendum)
Pt states left lower leg swelling x 1.5 months and pcp aware. Dx with dvt and is currently being treated, redness noted. No swelling to right lower extremity. States right ankle is only place hurting from fall. No obvious deformity ntoed. Pt states yesterday could walk fine. Pt statets he woke up and couldnt walk or talk good, awoke at 830am today. Went to sleep at 1am. States did drink alcohol last night. edp aware.

## 2017-06-25 NOTE — ED Triage Notes (Signed)
Pt a/o. States has fallen over 20 times this am. States is having involuntary "jerking" which is noticed during triage. ble swelling which is new. Pt found diaphroetic with ems. Pt staes that was trying to get out of floor. Received 552ml ns and bs 122 and 50mg  benadryl iv en route.

## 2017-06-25 NOTE — ED Provider Notes (Addendum)
[place] DEPT Provider Note   CSN: [id number] Arrival date & time: 06/25/17  1243     History   Chief Complaint Chief Complaint  Patient presents with  . Fall    HPI David [name] is a 61 y.o. male.  Level V caveat for urgent need for intervention. Patient slept in his chair last night. When he tried to get up this morning he became very unsteady and fell backward into the chair. He had trouble walking to the kitchen. No prodromal illnesses. He lives alone. Past medical history includes schizophrenia, COPD, hypertension, ITP, chronic kidney disease.  No fever, sweats, chills, chest pain, dyspnea, dysuria.      Past Medical History:  Diagnosis Date  . Anxiety   . COPD (chronic obstructive pulmonary disease) ([place])   . GERD (gastroesophageal reflux disease)   . Pneumonia   . Schizophrenia [facility])     Patient Active Problem List   Diagnosis Date Noted  . DVT (deep venous thrombosis) ([place]) 05/24/2017  . Thrombocytopenia ([place]) 04/03/2017  . Essential hypertension 02/15/2016  . Schizophrenia ([place])   . Hyperprolactinemia ([place]) 12/07/2015  . History of idiopathic thrombocytopenic purpura 12/05/2015  . Paranoid schizophrenia ([place]) 12/03/2015  . GERD (gastroesophageal reflux disease) 03/04/2012  . Insomnia 03/04/2012  . CKD (chronic kidney disease) stage 2, GFR 60-89 ml/min 03/04/2012  . Schizophrenia, paranoid, chronic ([place]) 02/18/2012    Past Surgical History:  Procedure Laterality Date  . TONSILLECTOMY         Home Medications    Prior to Admission medications   Medication Sig Start Date End Date Taking? Authorizing Provider  benztropine (COGENTIN) 0.5 MG tablet Take 1 tablet (0.5 mg total) by mouth at bedtime. 12/30/15  Yes Withrow, Elyse Jarvis, FNP  gabapentin (NEURONTIN) 300 MG capsule Take 2 capsules (600 mg total) by mouth at bedtime. Patient taking differently: Take 600 mg by mouth 2 (two) times daily.  12/30/15  Yes Withrow, Elyse Jarvis, FNP  haloperidol  (HALDOL) 5 MG tablet Take 10 mg by mouth at bedtime.    Yes [provider]  losartan (COZAAR) 50 MG tablet TAKE 1 TABLET (50 MG TOTAL) BY MOUTH DAILY. 04/10/17  Yes Stacks, Cletus Gash, MD  pantoprazole (PROTONIX) 40 MG tablet TAKE 1 TABLET (40 MG TOTAL) BY MOUTH DAILY. 05/15/17  Yes Stacks, Cletus Gash, MD  QUEtiapine (SEROQUEL) 25 MG tablet 25 mg 2 (two) times daily.  04/20/17  Yes [provider]  rivaroxaban (XARELTO) 20 MG TABS tablet Take 1 tablet (20 mg total) by mouth daily with supper. 06/09/17  Yes Stacks, Cletus Gash, MD  zolpidem (AMBIEN) 5 MG tablet Take 5 mg by mouth at bedtime as needed for sleep.   Yes [provider]  hydrOXYzine (ATARAX/VISTARIL) 25 MG tablet Take 1 tablet (25 mg total) by mouth at bedtime as needed (insomnia). Patient not taking: Reported on 06/25/2017 12/30/15   Benjamine Mola, FNP  zolpidem Lorrin Mais) 10 MG tablet  04/20/17   [provider]    Family History Family History  Problem Relation Age of Onset  . Arthritis Mother   . Hypertension Mother     Social History Social History  Substance Use Topics  . Smoking status: Current Every Day Smoker    Packs/day: 1.50    Years: 40.00    Types: Cigarettes    Start date: 04/14/1974  . Smokeless tobacco: Never Used  . Alcohol use 3.6 oz/week    6 Cans of beer per week     Comment: twice a  week liquor     Allergies   Lithium; Trazodone and nefazodone; and Trazodone   Review of Systems Review of Systems  Reason unable to perform ROS: Urgent need for intervention.     Physical Exam Updated Vital Signs BP (!) 121/59 (BP Location: Right Arm)   Pulse 96   Temp 98.1 F (36.7 C) (Oral)   Resp 16   SpO2 97%   Physical Exam  Constitutional:  Unkempt, questionable slightly confused  HENT:  Head: Normocephalic and atraumatic.  Eyes: Conjunctivae are normal.  Neck: Neck supple.  Cardiovascular: Normal rate and regular rhythm.   Pulmonary/Chest: Effort normal and breath sounds  normal.  Abdominal: Soft. Bowel sounds are normal.  Musculoskeletal: Normal range of motion.  Neurological: He is alert.  Patient had a very unsteady gait.  Skin: Skin is warm and dry.  Psychiatric: He has a normal mood and affect. His behavior is normal.  Nursing note and vitals reviewed.    ED Treatments / Results  Labs (all labs ordered are listed, but only abnormal results are displayed) Labs Reviewed  CBC WITH DIFFERENTIAL/PLATELET - Abnormal; Notable for the following:       Result Value   WBC 21.1 (*)    Hemoglobin 10.6 (*)    HCT 35.3 (*)    MCH 24.8 (*)    RDW 18.7 (*)    Platelets 105 (*)    Neutro Abs 18.1 (*)    Monocytes Absolute 1.5 (*)    All other components within normal limits  COMPREHENSIVE METABOLIC PANEL - Abnormal; Notable for the following:    Chloride 98 (*)    Creatinine, Ser 1.79 (*)    Calcium 8.4 (*)    Total Protein 5.8 (*)    Albumin 3.0 (*)    ALT 10 (*)    GFR calc non Af Amer 40 (*)    GFR calc Af Amer 46 (*)    All other components within normal limits  RAPID URINE DRUG SCREEN, HOSP PERFORMED - Abnormal; Notable for the following:    Tetrahydrocannabinol POSITIVE (*)    All other components within normal limits  URINALYSIS, ROUTINE W REFLEX MICROSCOPIC  ETHANOL    EKG  EKG Interpretation  Date/Time:  Tuesday June 25 2017 13:08:42 EDT Ventricular Rate:  93 PR Interval:    QRS Duration: 91 QT Interval:  355 QTC Calculation: 442 R Axis:   83 Text Interpretation:  Sinus rhythm Low voltage, precordial leads Confirmed by Nat Christen [phone]) on 06/25/2017 4:56:54 PM       Radiology Dg Chest 2 View  Result Date: 06/25/2017 CLINICAL DATA:  Cough EXAM: CHEST  2 VIEW COMPARISON:  12/02/2015 chest radiograph. FINDINGS: Stable cardiomediastinal silhouette with normal heart size. No pneumothorax. Chronic mild blunting of the bilateral costophrenic angles, suggesting chronic mild pleural-parenchymal scarring. No significant pleural  effusions. There is hazy opacity in the peripheral left mid lung with associated faint air bronchograms. IMPRESSION: Hazy peripheral left midlung opacity with associated faint air bronchograms, cannot exclude a pneumonia. Recommend follow-up PA and lateral post treatment chest radiographs in 4-6 weeks. Electronically Signed   By: Ilona Sorrel M.D.   On: 06/25/2017 14:09   Ct Head Wo Contrast  Result Date: 06/25/2017 CLINICAL DATA:  61 year old male with lower extremity swelling, multiple recent falls, diaphoretic, involuntary movements. EXAM: CT HEAD WITHOUT CONTRAST TECHNIQUE: Contiguous axial images were obtained from the base of the skull through the vertex without intravenous contrast. COMPARISON:  Head CT without contrast 12/01/2015.  FINDINGS: Brain: Stable cerebral volume, within normal limits. Chronic perivascular space versus lacunar infarct along the medial right lentiform nuclei (series 2, image 17). Elsewhere gray-white matter differentiation is stable and within normal limits. No midline shift, ventriculomegaly, mass effect, evidence of mass lesion, intracranial hemorrhage or evidence of cortically based acute infarction. No cortical encephalomalacia identified. Vascular: Calcified atherosclerosis at the skull base. No suspicious intracranial vascular hyperdensity. Skull: Stable and intact.  No acute osseous abnormality identified. Sinuses/Orbits: Visualized paranasal sinuses and mastoids are stable and well pneumatized. Other: Visualized orbits and scalp soft tissues are within normal limits. IMPRESSION: 1.  No acute intracranial abnormality. 2. Stable non contrast CT appearance of the brain since 2016, negative except for a right lentiform chronic lacune versus perivascular space. Electronically Signed   By: Genevie Ann M.D.   On: 06/25/2017 14:05    Procedures Procedures (including critical care time)  Medications Ordered in ED Medications  sodium chloride 0.9 % bolus 1,000 mL (0 mLs  Intravenous Stopped 06/25/17 1417)  azithromycin (ZITHROMAX) 500 mg in dextrose 5 % 250 mL IVPB (500 mg Intravenous New Bag/Given 06/25/17 1637)  cefTRIAXone (ROCEPHIN) 1 g in dextrose 5 % 50 mL IVPB (0 g Intravenous Stopped 06/25/17 1637)     Initial Impression / Assessment and Plan / ED Course  I have reviewed the triage vital signs and the nursing notes.  Pertinent labs & imaging results that were available during my care of the patient were reviewed by me and considered in my medical decision making (see chart for details).     Patient presents with generalized weakness and inability to get up and ambulate. White count 21K. Chest x-ray reveals a probable left sided pneumonia. IV Zithromax, IV Rocephin, admit to general medicine.  Final Clinical Impressions(s) / ED Diagnoses   Final diagnoses:  Fall, initial encounter  Community acquired pneumonia of left lung, unspecified part of lung    New Prescriptions New Prescriptions   No medications on file     Nat Christen, MD 06/25/17 1811    Nat Christen, MD 06/25/17 Tresa Moore

## 2017-06-26 DIAGNOSIS — I82403 Acute embolism and thrombosis of unspecified deep veins of lower extremity, bilateral: Secondary | ICD-10-CM

## 2017-06-26 LAB — BASIC METABOLIC PANEL
ANION GAP: 4 — AB (ref 5–15)
BUN: 11 mg/dL (ref 6–20)
CHLORIDE: 102 mmol/L (ref 101–111)
CO2: 32 mmol/L (ref 22–32)
Calcium: 8.2 mg/dL — ABNORMAL LOW (ref 8.9–10.3)
Creatinine, Ser: 1.57 mg/dL — ABNORMAL HIGH (ref 0.61–1.24)
GFR calc Af Amer: 54 mL/min — ABNORMAL LOW (ref >60)
GFR calc non Af Amer: 47 mL/min — ABNORMAL LOW (ref >60)
GLUCOSE: 110 mg/dL — AB (ref 65–99)
POTASSIUM: 4.3 mmol/L (ref 3.5–5.1)
Sodium: 138 mmol/L (ref 135–145)

## 2017-06-26 LAB — CBC WITH DIFFERENTIAL/PLATELET
BASOS ABS: 0.1 10*3/uL (ref 0.0–0.1)
Basophils Relative: 1 %
EOS PCT: 8 %
Eosinophils Absolute: 1.2 10*3/uL — ABNORMAL HIGH (ref 0.0–0.7)
HEMATOCRIT: 34.3 % — AB (ref 39.0–52.0)
Hemoglobin: 10.2 g/dL — ABNORMAL LOW (ref 13.0–17.0)
LYMPHS ABS: 1.7 10*3/uL (ref 0.7–4.0)
LYMPHS PCT: 12 %
MCH: 24.9 pg — ABNORMAL LOW (ref 26.0–34.0)
MCHC: 29.7 g/dL — ABNORMAL LOW (ref 30.0–36.0)
MCV: 83.7 fL (ref 78.0–100.0)
Monocytes Absolute: 1.7 10*3/uL — ABNORMAL HIGH (ref 0.1–1.0)
Monocytes Relative: 12 %
NEUTROS ABS: 9.5 10*3/uL — AB (ref 1.7–7.7)
Neutrophils Relative %: 67 %
PLATELETS: 133 10*3/uL — AB (ref 150–400)
RBC: 4.1 MIL/uL — AB (ref 4.22–5.81)
RDW: 18.6 % — ABNORMAL HIGH (ref 11.5–15.5)
WBC: 14.2 10*3/uL — AB (ref 4.0–10.5)

## 2017-06-26 LAB — VITAMIN B12: Vitamin B-12: 695 pg/mL (ref 180–914)

## 2017-06-26 LAB — STREP PNEUMONIAE URINARY ANTIGEN: Strep Pneumo Urinary Antigen: NEGATIVE

## 2017-06-26 MED ORDER — LEVOFLOXACIN 500 MG PO TABS
500.0000 mg | ORAL_TABLET | Freq: Every day | ORAL | Status: DC
  Administered 2017-06-26 – 2017-06-27 (×2): 500 mg via ORAL
  Filled 2017-06-26 (×2): qty 1

## 2017-06-26 NOTE — Care Management Note (Signed)
Case Management Note  Patient Details  Name: David [name] MRN: [id number] Date of Birth: October 03, 1956  Subjective/Objective:  Adm with CAP/ falls. From home, ind with ADL's PTA, until recently reports falling due to jerking movements. Seen by PT, recommending for HHPT and RW. Patient agreeable. CM offered choice of Home health agencies.                   Action/Plan: [place] [facility] notified and will obtain orders from chart. Patient aware that [facility] has 48 hours to initiate services post discharge.    Expected Discharge Date:       06/28/2017           Expected Discharge Plan:  [place]  In-House Referral:     Discharge planning Services  CM Consult  Post Acute Care Choice:  Durable Medical Equipment, Home Health Choice offered to:  Patient  DME Arranged:  Walker rolling DME Agency:  [place]:  PT [facility] Agency:  [place]  Status of Service:  In process, will continue to follow  If discussed at Long Length of Stay Meetings, dates discussed:    Additional Comments:  Teva Bronkema, Chauncey [place], RN 06/26/2017, 2:31 PM

## 2017-06-26 NOTE — Care Management (Signed)
    Durable Medical Equipment        Start     Ordered   06/26/17 1529  For home use only DME Walker rolling  Once    Question:  Patient needs a walker to treat with the following condition  Answer:  Weakness   06/26/17 1528

## 2017-06-26 NOTE — Progress Notes (Addendum)
PROGRESS NOTE    David Voeltz  GQQ:[id number] DOB: 02-12-1956 DOA: 06/25/2017 PCP: Claretta Fraise, MD     Brief Narrative:  61 year old man admitted to the hospital on 6/26 due to weakness and falls. He has a history of schizophrenia, anxiety, COPD, GERD, hypertension. Has also been complaining of myoclonic jerking. He states he is unable to walk. On admission was found to have community-acquired pneumonia.   Assessment & Plan:   Principal Problem:   Community acquired pneumonia of left lung Active Problems:   Schizophrenia, paranoid, chronic (HCC)   GERD (gastroesophageal reflux disease)   Essential hypertension   Thrombocytopenia (HCC)   DVT (deep venous thrombosis) (HCC)   COPD (chronic obstructive pulmonary disease) (HCC)   Tobacco use disorder   Fracture of the little toe middle phalanx.   Community-acquired pneumonia -Culture data remains negative, he has pulled out 3 IVs today. Will transition antibiotics to oral Levaquin. -Pneumonia could certainly be contributing to his weakness. -He does not appear to be short of breath on exam and does not have significant coughing.  Generalized weakness -Suspect largely related to his pneumonia and schizophrenia. -He appears quite unkempt and disheveled. -We'll check a vitamin B-12 level as this could account for some of his myoclonic jerking and weakness. -We'll request a PT evaluation.  Schizophrenia -Has had no recent medication changes: Continue gabapentin, Cogentin, Haldol, Seroquel.  History of DVT -Continue Xarelto  CKD Stage III -Cr is at baseline.  Acute fracture of right little toe    DVT prophylaxis: Xarelto Code Status: full code Family Communication: patient only Disposition Plan: likely DC home in 24 hours  Consultants:   None  Procedures:   None  Antimicrobials:  Anti-infectives    Start     Dose/Rate Route Frequency Ordered Stop   06/26/17 1800  azithromycin (ZITHROMAX) 500 mg in dextrose  5 % 250 mL IVPB  Status:  Discontinued     500 mg 250 mL/hr over 60 Minutes Intravenous Every 24 hours 06/25/17 2107 06/26/17 1421   06/26/17 1600  cefTRIAXone (ROCEPHIN) 1 g in dextrose 5 % 50 mL IVPB  Status:  Discontinued     1 g 100 mL/hr over 30 Minutes Intravenous Every 24 hours 06/25/17 2107 06/26/17 1421   06/26/17 1430  levofloxacin (LEVAQUIN) tablet 500 mg     500 mg Oral Daily 06/26/17 1421     06/25/17 1545  cefTRIAXone (ROCEPHIN) 1 g in dextrose 5 % 50 mL IVPB     1 g 100 mL/hr over 30 Minutes Intravenous  Once 06/25/17 1535 06/25/17 1637   06/25/17 1545  azithromycin (ZITHROMAX) 500 mg in dextrose 5 % 250 mL IVPB  Status:  Discontinued     500 mg 250 mL/hr over 60 Minutes Intravenous Every 24 hours 06/25/17 1535 06/26/17 0842       Subjective: Lying in bed, disorganized thoughts, disheveled  Objective: Vitals:   06/26/17 0822 06/26/17 1100 06/26/17 1115 06/26/17 1440  BP:      Pulse:  90 99   Resp:      Temp:      TempSrc:      SpO2: (!) 85% 97% 94% 95%  Weight:      Height:        Intake/Output Summary (Last 24 hours) at 06/26/17 1633 Last data filed at 06/26/17 1545  Gross per 24 hour  Intake          1234.26 ml  Output  950 ml  Net           284.26 ml   Filed Weights   06/25/17 2118  Weight: 93.4 kg (205 lb 14.9 oz)    Examination:  General exam: Alert, awake, oriented x 3, unkempt Respiratory system: Coarse bilateral breath sounds Cardiovascular system:RRR. No murmurs, rubs, gallops. Gastrointestinal system: Abdomen is nondistended, soft and nontender. No organomegaly or masses felt. Normal bowel sounds heard. Central nervous system: Alert and oriented. No focal neurological deficits. Extremities: No C/C/E, +pedal pulses Skin: No rashes, lesions or ulcers Psychiatry: disorganized thoughts     Data Reviewed: I have personally reviewed following labs and imaging studies  CBC:  Recent Labs Lab 06/25/17 1321 06/26/17 0605    WBC 21.1* 14.2*  NEUTROABS 18.1* 9.5*  HGB 10.6* 10.2*  HCT 35.3* 34.3*  MCV 82.7 83.7  PLT 105* 809*   Basic Metabolic Panel:  Recent Labs Lab 06/25/17 1321 06/25/17 1330 06/26/17 0605  NA 136  --  138  K 4.7  --  4.3  CL 98*  --  102  CO2 30  --  32  GLUCOSE 97  --  110*  BUN 12  --  11  CREATININE 1.79*  --  1.57*  CALCIUM 8.4*  --  8.2*  MG  --  1.9  --   PHOS  --  4.1  --    GFR: Estimated Creatinine Clearance: 60.1 mL/min (A) (by C-G formula based on SCr of 1.57 mg/dL (H)). Liver Function Tests:  Recent Labs Lab 06/25/17 1321  AST 17  ALT 10*  ALKPHOS 84  BILITOT 0.7  PROT 5.8*  ALBUMIN 3.0*   No results for input(s): LIPASE, AMYLASE in the last 168 hours. No results for input(s): AMMONIA in the last 168 hours. Coagulation Profile: No results for input(s): INR, PROTIME in the last 168 hours. Cardiac Enzymes: No results for input(s): CKTOTAL, CKMB, CKMBINDEX, TROPONINI in the last 168 hours. BNP (last 3 results) No results for input(s): PROBNP in the last 8760 hours. HbA1C: No results for input(s): HGBA1C in the last 72 hours. CBG: No results for input(s): GLUCAP in the last 168 hours. Lipid Profile: No results for input(s): CHOL, HDL, LDLCALC, TRIG, CHOLHDL, LDLDIRECT in the last 72 hours. Thyroid Function Tests: No results for input(s): TSH, T4TOTAL, FREET4, T3FREE, THYROIDAB in the last 72 hours. Anemia Panel: No results for input(s): VITAMINB12, FOLATE, FERRITIN, TIBC, IRON, RETICCTPCT in the last 72 hours. Urine analysis:    Component Value Date/Time   COLORURINE YELLOW 06/25/2017 [place] 06/25/2017 1345   LABSPEC 1.009 06/25/2017 1345   PHURINE 6.0 06/25/2017 1345   GLUCOSEU NEGATIVE 06/25/2017 1345   HGBUR NEGATIVE 06/25/2017 1345   BILIRUBINUR NEGATIVE 06/25/2017 1345   KETONESUR NEGATIVE 06/25/2017 1345   PROTEINUR NEGATIVE 06/25/2017 1345   UROBILINOGEN 0.2 01/03/2014 1807   NITRITE NEGATIVE 06/25/2017 1345    LEUKOCYTESUR NEGATIVE 06/25/2017 1345   Sepsis Labs: @LABRCNTIP (procalcitonin:4,lacticidven:4)  )No results found for this or any previous visit (from the past 240 hour(s)).       Radiology Studies: Dg Chest 2 View  Result Date: 06/25/2017 CLINICAL DATA:  Cough EXAM: CHEST  2 VIEW COMPARISON:  12/02/2015 chest radiograph. FINDINGS: Stable cardiomediastinal silhouette with normal heart size. No pneumothorax. Chronic mild blunting of the bilateral costophrenic angles, suggesting chronic mild pleural-parenchymal scarring. No significant pleural effusions. There is hazy opacity in the peripheral left mid lung with associated faint air bronchograms. IMPRESSION: Hazy peripheral left midlung  opacity with associated faint air bronchograms, cannot exclude a pneumonia. Recommend follow-up PA and lateral post treatment chest radiographs in 4-6 weeks. Electronically Signed   By: Ilona Sorrel M.D.   On: 06/25/2017 14:09   Ct Head Wo Contrast  Result Date: 06/25/2017 CLINICAL DATA:  61 year old male with lower extremity swelling, multiple recent falls, diaphoretic, involuntary movements. EXAM: CT HEAD WITHOUT CONTRAST TECHNIQUE: Contiguous axial images were obtained from the base of the skull through the vertex without intravenous contrast. COMPARISON:  Head CT without contrast 12/01/2015. FINDINGS: Brain: Stable cerebral volume, within normal limits. Chronic perivascular space versus lacunar infarct along the medial right lentiform nuclei (series 2, image 17). Elsewhere gray-white matter differentiation is stable and within normal limits. No midline shift, ventriculomegaly, mass effect, evidence of mass lesion, intracranial hemorrhage or evidence of cortically based acute infarction. No cortical encephalomalacia identified. Vascular: Calcified atherosclerosis at the skull base. No suspicious intracranial vascular hyperdensity. Skull: Stable and intact.  No acute osseous abnormality identified. Sinuses/Orbits:  Visualized paranasal sinuses and mastoids are stable and well pneumatized. Other: Visualized orbits and scalp soft tissues are within normal limits. IMPRESSION: 1.  No acute intracranial abnormality. 2. Stable non contrast CT appearance of the brain since 2016, negative except for a right lentiform chronic lacune versus perivascular space. Electronically Signed   By: Genevie Ann M.D.   On: 06/25/2017 14:05   Dg Foot 2 Views Right  Result Date: 06/25/2017 CLINICAL DATA:  Acute right foot pain following fall. Initial encounter. EXAM: RIGHT FOOT - 2 VIEW COMPARISON:  None. FINDINGS: A fracture of the little toe middle phalanx is noted. No other fracture, subluxation or dislocation noted. The Lisfranc joints are unremarkable. Dorsal soft tissue swelling is present. IMPRESSION: Fracture of the little toe middle phalanx. Electronically Signed   By: Margarette Canada M.D.   On: 06/25/2017 20:19        Scheduled Meds: . benztropine  0.5 mg Oral QHS  . gabapentin  600 mg Oral BID  . haloperidol  10 mg Oral QHS  . ipratropium-albuterol  3 mL Nebulization TID  . levofloxacin  500 mg Oral Daily  . pantoprazole  40 mg Oral Daily  . QUEtiapine  25 mg Oral BID  . rivaroxaban  20 mg Oral Q supper   Continuous Infusions: . sodium chloride Stopped (06/26/17 1253)  . sodium chloride Stopped (06/25/17 1417)     LOS: 1 day    Time spent: 30 minutes. Greater than 50% of this time was spent in direct contact with the patient coordinating care.     Lelon Frohlich, MD [facility] Pager [phone]  If 7PM-7AM, please contact night-coverage [email or web address] Password [facility] 06/26/2017, 4:33 PM

## 2017-06-26 NOTE — Progress Notes (Signed)
Pt refusing cardiac monitoring. MD made aware. Will continue to monitor pt.

## 2017-06-26 NOTE — Evaluation (Signed)
Physical Therapy Evaluation Patient Details Name: David [name] MRN: [id number] DOB: September 12, 1956 Today's Date: 06/26/2017   History of Present Illness  Lamine "David Carol" [name] is a 61yo white male who comes to [facility] on 6/26 after sudden insidious onset BLE pain, weakness, and instability with several falls at home. Upone arrival he is found with CAP and tremors. PMH: GAD, COPD, GERD, HTN, PNA, tobacco use, schizophrenia. He reports the last med change he recalls for schizophrenia was about 2 years ago without any motoric symptoms of twitching or tremor, no has he ever experienced these symptoms before with any of his past schizophrenia meds. He reports BLE edema x2M with DVT. Imaging shows broken middle phlalanx on the Rt 5th digit of the foot.  He reports his leg pain as predominantly anterior distal leg from mid tibia to foot.   Clinical Impression  Pt admitted with above diagnosis. Pt currently with functional limitations due to the deficits listed below (see "PT Problem List"). At evaluation, pt is received semirecumbent in bed upon entry, RN placing IV access into left hand. The pt is awake and agreeable to participate. No acute distress noted at this time, as pain abates at rest. VSS during eval on 3LPM O2. Pt is not on O2 at baseline at home. Cognition:The pt is alert and oriented x3, pleasant, conversational, and following simple and multi-step commands consistently. Strength:  -MMT Screening reveals 5/5 strength in BUE, BLE; ankles not tested due to edema and pain (3/5 or greater).  -Functional mobility demonstrates mild weakness, the pt now requiring contact guard assist for AMB and with distance limited to <13ft, whereas the patient ambulates the community without restriction at baseline.   Tone: Myoclonus in elbows Bilat; unable to test BLE due to pt unable to relax.  Joint Proprioception: finger to nose eyes closed WNL bilat.  Light Touch Sensation: intact BUE, BLE  Dysmetria: not  assessed Dysdiadochokinesia: not assessed  Balance: unable to formaly test due to acute instability and jerking  Pt will benefit from skilled PT intervention to increase independence and safety with basic mobility in preparation for discharge to the venue listed below.       Follow Up Recommendations Home health PT    Equipment Recommendations  Rolling walker with 5" wheels    Recommendations for Other Services       Precautions / Restrictions Precautions Precautions: Fall      Mobility  Bed Mobility Overal bed mobility: Modified Independent                Transfers Overall transfer level: Needs assistance Equipment used: Rolling walker (2 wheeled) Transfers: Sit to/from Stand Sit to Stand: Min guard         General transfer comment: a few instance of UE jerking, otherwise stable.   Ambulation/Gait Ambulation/Gait assistance: Min guard Ambulation Distance (Feet): 30 Feet Assistive device: Rolling walker (2 wheeled) Gait Pattern/deviations: Step-to pattern     General Gait Details: heavy BUE support for safety; stops due to progressive weakness/pain in legs.   Stairs            Wheelchair Mobility    Modified Rankin (Stroke Patients Only)       Balance Overall balance assessment: Needs assistance         Standing balance support: During functional activity;Bilateral upper extremity supported   Standing balance comment: mildly unsteady, but no LOB noted.  Pertinent Vitals/Pain Pain Assessment: 0-10 Pain Score: 6  Pain Location: 0/10 at rest; increases with AMB and weight bearing  Pain Descriptors / Indicators: Sharp Pain Intervention(s): Limited activity within patient's tolerance;Monitored during session;Premedicated before session    Home Living Family/patient expects to be discharged to:: Private residence Living Arrangements: Alone Available Help at Discharge: Family (dad lives nearby;  half-brother also does but relationship is not very good) Type of Home: House Home Access: Stairs to enter Entrance Stairs-Rails: Right Entrance Stairs-Number of Steps: 3 Home Layout: Two level;Able to live on main level with bedroom/bathroom Home Equipment: None      Prior Function Level of Independence: Independent         Comments: independent community dwelling adult     Hand Dominance        Extremity/Trunk Assessment   Upper Extremity Assessment Upper Extremity Assessment: Overall WFL for tasks assessed (Myclonus in biceps/triceps; strength intact; digital opposition intact, finger to nose WNL)    Lower Extremity Assessment Lower Extremity Assessment: Overall WFL for tasks assessed (strength 5/5; sensation intact; pedal edema bilat up to mid tibia, Rt worse with mildly more warmth and erythema on Right dorsal foot. Unable to relax to allow testing of tone)       Communication   Communication: No difficulties  Cognition Arousal/Alertness: Awake/alert Behavior During Therapy: WFL for tasks assessed/performed;Anxious (mildly anxious) Overall Cognitive Status: Within Functional Limits for tasks assessed                                        General Comments      Exercises     Assessment/Plan    PT Assessment Patient needs continued PT services  PT Problem List Decreased strength;Decreased activity tolerance;Decreased mobility;Decreased knowledge of use of DME       PT Treatment Interventions Gait training;Functional mobility training;Therapeutic activities;Therapeutic exercise;Balance training;Patient/family education    PT Goals (Current goals can be found in the Care Plan section)  Acute Rehab PT Goals Patient Stated Goal: resolve BLE pain and twitching, instability PT Goal Formulation: With patient Time For Goal Achievement: 07/10/17 Potential to Achieve Goals: Fair    Frequency Min 2X/week   Barriers to discharge Inaccessible  home environment;Decreased caregiver support      Co-evaluation               AM-PAC PT "6 Clicks" Daily Activity  Outcome Measure Difficulty turning over in bed (including adjusting bedclothes, sheets and blankets)?: A Little Difficulty moving from lying on back to sitting on the side of the bed? : A Little Difficulty sitting down on and standing up from a chair with arms (e.g., wheelchair, bedside commode, etc,.)?: A Little Help needed moving to and from a bed to chair (including a wheelchair)?: A Lot Help needed walking in hospital room?: A Lot Help needed climbing 3-5 steps with a railing? : Total 6 Click Score: 14    End of Session Equipment Utilized During Treatment: Gait belt;Oxygen Activity Tolerance: Patient tolerated treatment well;Patient limited by fatigue;Patient limited by pain Patient left: in bed;with call bell/phone within reach;with bed alarm set;with nursing/sitter in room (chaired-out, RLE elevated ) Nurse Communication: Mobility status PT Visit Diagnosis: Unsteadiness on feet (R26.81);Other abnormalities of gait and mobility (R26.89);Other symptoms and signs involving the nervous system (R29.898)    Time: 5427-0623 PT Time Calculation (min) (ACUTE ONLY): 40 min   Charges:   PT  Evaluation $PT Eval Moderate Complexity: 1 Procedure PT Treatments $Therapeutic Activity: 8-22 mins   PT G Codes:        1:20 PM, 16-Jul-2017 Etta Grandchild, PT, DPT Physical Therapist - [place] [phone] (Office)   Buccola,Allan C 2017-07-16, 1:11 PM

## 2017-06-26 NOTE — Progress Notes (Signed)
Loss of pt IV access.  Multiple RNs attempted to obtain new IV access, unsuccessful.  Notified Dr Jerilee Hoh.  Verbal order to discontinue IV antibiotics and add PO Levaquin Daily given.  OK to leave IV out at this time.

## 2017-06-27 DIAGNOSIS — J189 Pneumonia, unspecified organism: Principal | ICD-10-CM

## 2017-06-27 DIAGNOSIS — N183 Chronic kidney disease, stage 3 unspecified: Secondary | ICD-10-CM

## 2017-06-27 LAB — CBC WITH DIFFERENTIAL/PLATELET
BASOS ABS: 0.1 10*3/uL (ref 0.0–0.1)
BASOS PCT: 0 %
Eosinophils Absolute: 1.1 10*3/uL — ABNORMAL HIGH (ref 0.0–0.7)
Eosinophils Relative: 8 %
HEMATOCRIT: 31.8 % — AB (ref 39.0–52.0)
HEMOGLOBIN: 9.3 g/dL — AB (ref 13.0–17.0)
LYMPHS PCT: 10 %
Lymphs Abs: 1.3 10*3/uL (ref 0.7–4.0)
MCH: 24.7 pg — ABNORMAL LOW (ref 26.0–34.0)
MCHC: 29.2 g/dL — AB (ref 30.0–36.0)
MCV: 84.6 fL (ref 78.0–100.0)
MONO ABS: 1.4 10*3/uL — AB (ref 0.1–1.0)
MONOS PCT: 11 %
NEUTROS ABS: 8.7 10*3/uL — AB (ref 1.7–7.7)
NEUTROS PCT: 70 %
Platelets: 113 10*3/uL — ABNORMAL LOW (ref 150–400)
RBC: 3.76 MIL/uL — ABNORMAL LOW (ref 4.22–5.81)
RDW: 18.6 % — AB (ref 11.5–15.5)
WBC: 12.6 10*3/uL — ABNORMAL HIGH (ref 4.0–10.5)

## 2017-06-27 LAB — HIV ANTIBODY (ROUTINE TESTING W REFLEX): HIV Screen 4th Generation wRfx: NONREACTIVE

## 2017-06-27 MED ORDER — LEVOFLOXACIN 500 MG PO TABS: 500.0000 mg | ORAL_TABLET | Freq: Every day | ORAL | 0 refills | Status: DC

## 2017-06-27 NOTE — Progress Notes (Signed)
Reviewed discharge instructions with pt and answered questions at this time.  Pt discharged home with [facility] PT and walker at bedside.

## 2017-06-27 NOTE — Discharge Summary (Signed)
Physician Discharge Summary  Monta Police IPJ:[id number] DOB: [birth year] DOA: 06/25/2017  PCP: Claretta Fraise, MD  Admit date: 06/25/2017 Discharge date: 06/27/2017  Time spent: 45 minutes  Recommendations for Outpatient Follow-up:  -Will be discharged home today. -Will continue levaquin for 5 more days. -[place] services will be arranged.   Discharge Diagnoses:  Principal Problem:   Community acquired pneumonia of left lung Active Problems:   Schizophrenia, paranoid, chronic (HCC)   GERD (gastroesophageal reflux disease)   Essential hypertension   Thrombocytopenia (HCC)   DVT (deep venous thrombosis) (HCC)   COPD (chronic obstructive pulmonary disease) (HCC)   Tobacco use disorder   Fracture of the little toe middle phalanx.   CKD (chronic kidney disease) stage 3, GFR 30-59 ml/min   Discharge Condition: Stable and improved  Filed Weights   06/25/17 2118  Weight: 93.4 kg (205 lb 14.9 oz)    History of present illness:  As per Dr. Olevia Bowens on 6/26: David [name] is a 61 y.o. male with medical history significant of anxiety, COPD, GERD, hypertension, history of pneumonia, tobacco use disorder, schizophrenia was coming to the emergency department with complaints of multiple falls today and involuntary jerking/tremors, associated with progressively worse weakness for the past week or 2. He has a chronic productive cough. He denies fever, chills, chest pain, diaphoresis, PND, orthopnea, abdominal pain, nausea, emesis, diarrhea, melena, constipation or hematochezia. He denies dysuria, frequency or hematuria. The patient's is disheveled, with poor personal hygiene and is a poor historian.  Hospital Course:   Community-acquired pneumonia -Culture data remains negative -Will DC on 5 days of levaquin. -Pneumonia could certainly be contributing to his weakness.  Generalized weakness -Suspect largely related to his pneumonia and schizophrenia. -He appears quite unkempt and  disheveled. -B12 WNL. -PT evaluation: ok for HH services  Schizophrenia -Has had no recent medication changes: Continue gabapentin, Cogentin, Haldol, Seroquel.  History of DVT -Continue Xarelto  CKD Stage III -Cr is at baseline.  Acute fracture of right little toe  Procedures:  None   Consultations:  None  Discharge Instructions  Discharge Instructions    Diet - low sodium heart healthy    Complete by:  As directed    Increase activity slowly    Complete by:  As directed      Allergies as of 06/27/2017      Reactions   Lithium Nausea Only, Nausea And Vomiting, Other (See Comments)   "get's very sick"   Trazodone And Nefazodone Nausea And Vomiting   Trazodone Nausea And Vomiting      Medication List    TAKE these medications   benztropine 0.5 MG tablet Commonly known as:  COGENTIN Take 1 tablet (0.5 mg total) by mouth at bedtime.   gabapentin 300 MG capsule Commonly known as:  NEURONTIN Take 2 capsules (600 mg total) by mouth at bedtime. What changed:  when to take this   haloperidol 5 MG tablet Commonly known as:  HALDOL Take 10 mg by mouth at bedtime.   hydrOXYzine 25 MG tablet Commonly known as:  ATARAX/VISTARIL Take 1 tablet (25 mg total) by mouth at bedtime as needed (insomnia).   levofloxacin 500 MG tablet Commonly known as:  LEVAQUIN Take 1 tablet (500 mg total) by mouth daily. Start taking on:  06/28/2017   losartan 50 MG tablet Commonly known as:  COZAAR TAKE 1 TABLET (50 MG TOTAL) BY MOUTH DAILY.   pantoprazole 40 MG tablet Commonly known as:  PROTONIX TAKE 1 TABLET (40 MG  TOTAL) BY MOUTH DAILY.   QUEtiapine 25 MG tablet Commonly known as:  SEROQUEL 25 mg 2 (two) times daily.   rivaroxaban 20 MG Tabs tablet Commonly known as:  XARELTO Take 1 tablet (20 mg total) by mouth daily with supper.   zolpidem 5 MG tablet Commonly known as:  AMBIEN Take 5 mg by mouth at bedtime as needed for sleep. What changed:  Another medication  with the same name was removed. Continue taking this medication, and follow the directions you see here.            Durable Medical Equipment        Start     Ordered   06/26/17 1529  For home use only DME Walker rolling  Once    Question:  Patient needs a walker to treat with the following condition  Answer:  Weakness   06/26/17 1528     Allergies  Allergen Reactions  . Lithium Nausea Only, Nausea And Vomiting and Other (See Comments)    "get's very sick"  . Trazodone And Nefazodone Nausea And Vomiting  . Trazodone Nausea And Vomiting   Follow-up Information    [facility] Follow up.   Why:  Physical therapy at home Contact information: [place] [phone]       Claretta Fraise, MD. Schedule an appointment as soon as possible for a visit in 2 week(s).   Specialty:  Family Medicine Contact information: [place] [address] [phone]            The results of significant diagnostics from this hospitalization (including imaging, microbiology, ancillary and laboratory) are listed below for reference.    Significant Diagnostic Studies: Dg Chest 2 View  Result Date: 06/25/2017 CLINICAL DATA:  Cough EXAM: CHEST  2 VIEW COMPARISON:  12/02/2015 chest radiograph. FINDINGS: Stable cardiomediastinal silhouette with normal heart size. No pneumothorax. Chronic mild blunting of the bilateral costophrenic angles, suggesting chronic mild pleural-parenchymal scarring. No significant pleural effusions. There is hazy opacity in the peripheral left mid lung with associated faint air bronchograms. IMPRESSION: Hazy peripheral left midlung opacity with associated faint air bronchograms, cannot exclude a pneumonia. Recommend follow-up PA and lateral post treatment chest radiographs in 4-6 weeks. Electronically Signed   By: Ilona Sorrel M.D.   On: 06/25/2017 14:09   Ct Head Wo Contrast  Result Date:  06/25/2017 CLINICAL DATA:  61 year old male with lower extremity swelling, multiple recent falls, diaphoretic, involuntary movements. EXAM: CT HEAD WITHOUT CONTRAST TECHNIQUE: Contiguous axial images were obtained from the base of the skull through the vertex without intravenous contrast. COMPARISON:  Head CT without contrast 12/01/2015. FINDINGS: Brain: Stable cerebral volume, within normal limits. Chronic perivascular space versus lacunar infarct along the medial right lentiform nuclei (series 2, image 17). Elsewhere gray-white matter differentiation is stable and within normal limits. No midline shift, ventriculomegaly, mass effect, evidence of mass lesion, intracranial hemorrhage or evidence of cortically based acute infarction. No cortical encephalomalacia identified. Vascular: Calcified atherosclerosis at the skull base. No suspicious intracranial vascular hyperdensity. Skull: Stable and intact.  No acute osseous abnormality identified. Sinuses/Orbits: Visualized paranasal sinuses and mastoids are stable and well pneumatized. Other: Visualized orbits and scalp soft tissues are within normal limits. IMPRESSION: 1.  No acute intracranial abnormality. 2. Stable non contrast CT appearance of the brain since 2016, negative except for a right lentiform chronic lacune versus perivascular space. Electronically Signed   By: Herminio Heads.D.  On: 06/25/2017 14:05   Dg Foot 2 Views Right  Result Date: 06/25/2017 CLINICAL DATA:  Acute right foot pain following fall. Initial encounter. EXAM: RIGHT FOOT - 2 VIEW COMPARISON:  None. FINDINGS: A fracture of the little toe middle phalanx is noted. No other fracture, subluxation or dislocation noted. The Lisfranc joints are unremarkable. Dorsal soft tissue swelling is present. IMPRESSION: Fracture of the little toe middle phalanx. Electronically Signed   By: Margarette Canada M.D.   On: 06/25/2017 20:19    Microbiology: No results found for this or any previous visit (from the  past 240 hour(s)).   Labs: Basic Metabolic Panel:  Recent Labs Lab 06/25/17 1321 06/25/17 1330 06/26/17 0605  NA 136  --  138  K 4.7  --  4.3  CL 98*  --  102  CO2 30  --  32  GLUCOSE 97  --  110*  BUN 12  --  11  CREATININE 1.79*  --  1.57*  CALCIUM 8.4*  --  8.2*  MG  --  1.9  --   PHOS  --  4.1  --    Liver Function Tests:  Recent Labs Lab 06/25/17 1321  AST 17  ALT 10*  ALKPHOS 84  BILITOT 0.7  PROT 5.8*  ALBUMIN 3.0*   No results for input(s): LIPASE, AMYLASE in the last 168 hours. No results for input(s): AMMONIA in the last 168 hours. CBC:  Recent Labs Lab 06/25/17 1321 06/26/17 0605 06/27/17 0456  WBC 21.1* 14.2* 12.6*  NEUTROABS 18.1* 9.5* 8.7*  HGB 10.6* 10.2* 9.3*  HCT 35.3* 34.3* 31.8*  MCV 82.7 83.7 84.6  PLT 105* 133* 113*   Cardiac Enzymes: No results for input(s): CKTOTAL, CKMB, CKMBINDEX, TROPONINI in the last 168 hours. BNP: BNP (last 3 results) No results for input(s): BNP in the last 8760 hours.  ProBNP (last 3 results) No results for input(s): PROBNP in the last 8760 hours.  CBG: No results for input(s): GLUCAP in the last 168 hours.     SignedLelon Frohlich  [facility] Pager: [phone] 06/27/2017, 4:34 PM

## 2017-06-27 NOTE — Care Management Note (Signed)
Case Management Note  Patient Details  Name: David [name] MRN: [id number] Date of Birth: 09/11/1956    Expected Discharge Date:  06/27/17               Expected Discharge Plan:  [name]  In-House Referral:     Discharge planning Services  CM Consult  Post Acute Care Choice:  Durable Medical Equipment, Home Health Choice offered to:  Patient  DME Arranged:  Walker rolling DME Agency:  [name]:  PT [facility] Agency:  [name]  Status of Service:  In process, will continue to follow  If discussed at Long Length of Stay Meetings, dates discussed:    Additional Comments: Patient discharging home with [name] PT. RW delivered to room. No other CM needs. Patient agreeable to [name] transition calls.  Virgil Slinger, Chauncey Reading, RN 06/27/2017, 11:18 AM

## 2017-06-28 DIAGNOSIS — Z9181 History of falling: Secondary | ICD-10-CM | POA: Diagnosis not present

## 2017-06-28 DIAGNOSIS — W19XXXA Unspecified fall, initial encounter: Secondary | ICD-10-CM | POA: Diagnosis not present

## 2017-07-02 DIAGNOSIS — Z7901 Long term (current) use of anticoagulants: Secondary | ICD-10-CM | POA: Diagnosis not present

## 2017-07-02 DIAGNOSIS — K219 Gastro-esophageal reflux disease without esophagitis: Secondary | ICD-10-CM | POA: Diagnosis not present

## 2017-07-02 DIAGNOSIS — I10 Essential (primary) hypertension: Secondary | ICD-10-CM | POA: Diagnosis not present

## 2017-07-02 DIAGNOSIS — F17210 Nicotine dependence, cigarettes, uncomplicated: Secondary | ICD-10-CM | POA: Diagnosis not present

## 2017-07-02 DIAGNOSIS — J189 Pneumonia, unspecified organism: Secondary | ICD-10-CM | POA: Diagnosis not present

## 2017-07-02 DIAGNOSIS — F209 Schizophrenia, unspecified: Secondary | ICD-10-CM | POA: Diagnosis not present

## 2017-07-02 DIAGNOSIS — Z9181 History of falling: Secondary | ICD-10-CM | POA: Diagnosis not present

## 2017-07-02 DIAGNOSIS — J440 Chronic obstructive pulmonary disease with acute lower respiratory infection: Secondary | ICD-10-CM | POA: Diagnosis not present

## 2017-07-02 DIAGNOSIS — S92351D Displaced fracture of fifth metatarsal bone, right foot, subsequent encounter for fracture with routine healing: Secondary | ICD-10-CM | POA: Diagnosis not present

## 2017-07-05 DIAGNOSIS — F209 Schizophrenia, unspecified: Secondary | ICD-10-CM | POA: Diagnosis not present

## 2017-07-05 DIAGNOSIS — Z9181 History of falling: Secondary | ICD-10-CM | POA: Diagnosis not present

## 2017-07-05 DIAGNOSIS — I10 Essential (primary) hypertension: Secondary | ICD-10-CM | POA: Diagnosis not present

## 2017-07-05 DIAGNOSIS — F17210 Nicotine dependence, cigarettes, uncomplicated: Secondary | ICD-10-CM | POA: Diagnosis not present

## 2017-07-05 DIAGNOSIS — J189 Pneumonia, unspecified organism: Secondary | ICD-10-CM | POA: Diagnosis not present

## 2017-07-05 DIAGNOSIS — K219 Gastro-esophageal reflux disease without esophagitis: Secondary | ICD-10-CM | POA: Diagnosis not present

## 2017-07-05 DIAGNOSIS — Z7901 Long term (current) use of anticoagulants: Secondary | ICD-10-CM | POA: Diagnosis not present

## 2017-07-05 DIAGNOSIS — S92351D Displaced fracture of fifth metatarsal bone, right foot, subsequent encounter for fracture with routine healing: Secondary | ICD-10-CM | POA: Diagnosis not present

## 2017-07-05 DIAGNOSIS — J440 Chronic obstructive pulmonary disease with acute lower respiratory infection: Secondary | ICD-10-CM | POA: Diagnosis not present

## 2017-07-10 DIAGNOSIS — Z9181 History of falling: Secondary | ICD-10-CM | POA: Diagnosis not present

## 2017-07-10 DIAGNOSIS — K219 Gastro-esophageal reflux disease without esophagitis: Secondary | ICD-10-CM | POA: Diagnosis not present

## 2017-07-10 DIAGNOSIS — F17210 Nicotine dependence, cigarettes, uncomplicated: Secondary | ICD-10-CM | POA: Diagnosis not present

## 2017-07-10 DIAGNOSIS — J440 Chronic obstructive pulmonary disease with acute lower respiratory infection: Secondary | ICD-10-CM | POA: Diagnosis not present

## 2017-07-10 DIAGNOSIS — I10 Essential (primary) hypertension: Secondary | ICD-10-CM | POA: Diagnosis not present

## 2017-07-10 DIAGNOSIS — F209 Schizophrenia, unspecified: Secondary | ICD-10-CM | POA: Diagnosis not present

## 2017-07-10 DIAGNOSIS — J189 Pneumonia, unspecified organism: Secondary | ICD-10-CM | POA: Diagnosis not present

## 2017-07-10 DIAGNOSIS — Z7901 Long term (current) use of anticoagulants: Secondary | ICD-10-CM | POA: Diagnosis not present

## 2017-07-10 DIAGNOSIS — S92351D Displaced fracture of fifth metatarsal bone, right foot, subsequent encounter for fracture with routine healing: Secondary | ICD-10-CM | POA: Diagnosis not present

## 2017-07-12 DIAGNOSIS — I10 Essential (primary) hypertension: Secondary | ICD-10-CM | POA: Diagnosis not present

## 2017-07-12 DIAGNOSIS — Z7901 Long term (current) use of anticoagulants: Secondary | ICD-10-CM | POA: Diagnosis not present

## 2017-07-12 DIAGNOSIS — K219 Gastro-esophageal reflux disease without esophagitis: Secondary | ICD-10-CM | POA: Diagnosis not present

## 2017-07-12 DIAGNOSIS — Z9181 History of falling: Secondary | ICD-10-CM | POA: Diagnosis not present

## 2017-07-12 DIAGNOSIS — F209 Schizophrenia, unspecified: Secondary | ICD-10-CM | POA: Diagnosis not present

## 2017-07-12 DIAGNOSIS — J440 Chronic obstructive pulmonary disease with acute lower respiratory infection: Secondary | ICD-10-CM | POA: Diagnosis not present

## 2017-07-12 DIAGNOSIS — F17210 Nicotine dependence, cigarettes, uncomplicated: Secondary | ICD-10-CM | POA: Diagnosis not present

## 2017-07-12 DIAGNOSIS — S92351D Displaced fracture of fifth metatarsal bone, right foot, subsequent encounter for fracture with routine healing: Secondary | ICD-10-CM | POA: Diagnosis not present

## 2017-07-12 DIAGNOSIS — J189 Pneumonia, unspecified organism: Secondary | ICD-10-CM | POA: Diagnosis not present

## 2017-07-15 ENCOUNTER — Other Ambulatory Visit: Payer: Self-pay | Admitting: Family Medicine

## 2017-07-15 DIAGNOSIS — F17210 Nicotine dependence, cigarettes, uncomplicated: Secondary | ICD-10-CM | POA: Diagnosis not present

## 2017-07-15 DIAGNOSIS — F209 Schizophrenia, unspecified: Secondary | ICD-10-CM | POA: Diagnosis not present

## 2017-07-15 DIAGNOSIS — J440 Chronic obstructive pulmonary disease with acute lower respiratory infection: Secondary | ICD-10-CM | POA: Diagnosis not present

## 2017-07-15 DIAGNOSIS — Z7901 Long term (current) use of anticoagulants: Secondary | ICD-10-CM | POA: Diagnosis not present

## 2017-07-15 DIAGNOSIS — K219 Gastro-esophageal reflux disease without esophagitis: Secondary | ICD-10-CM | POA: Diagnosis not present

## 2017-07-15 DIAGNOSIS — J189 Pneumonia, unspecified organism: Secondary | ICD-10-CM | POA: Diagnosis not present

## 2017-07-15 DIAGNOSIS — Z9181 History of falling: Secondary | ICD-10-CM | POA: Diagnosis not present

## 2017-07-15 DIAGNOSIS — I10 Essential (primary) hypertension: Secondary | ICD-10-CM | POA: Diagnosis not present

## 2017-07-15 DIAGNOSIS — S92351D Displaced fracture of fifth metatarsal bone, right foot, subsequent encounter for fracture with routine healing: Secondary | ICD-10-CM | POA: Diagnosis not present

## 2017-07-18 DIAGNOSIS — J189 Pneumonia, unspecified organism: Secondary | ICD-10-CM | POA: Diagnosis not present

## 2017-07-18 DIAGNOSIS — K219 Gastro-esophageal reflux disease without esophagitis: Secondary | ICD-10-CM | POA: Diagnosis not present

## 2017-07-18 DIAGNOSIS — I10 Essential (primary) hypertension: Secondary | ICD-10-CM | POA: Diagnosis not present

## 2017-07-18 DIAGNOSIS — Z7901 Long term (current) use of anticoagulants: Secondary | ICD-10-CM | POA: Diagnosis not present

## 2017-07-18 DIAGNOSIS — J440 Chronic obstructive pulmonary disease with acute lower respiratory infection: Secondary | ICD-10-CM | POA: Diagnosis not present

## 2017-07-18 DIAGNOSIS — F209 Schizophrenia, unspecified: Secondary | ICD-10-CM | POA: Diagnosis not present

## 2017-07-18 DIAGNOSIS — S92351D Displaced fracture of fifth metatarsal bone, right foot, subsequent encounter for fracture with routine healing: Secondary | ICD-10-CM | POA: Diagnosis not present

## 2017-07-18 DIAGNOSIS — F17210 Nicotine dependence, cigarettes, uncomplicated: Secondary | ICD-10-CM | POA: Diagnosis not present

## 2017-07-18 DIAGNOSIS — Z9181 History of falling: Secondary | ICD-10-CM | POA: Diagnosis not present

## 2017-07-31 ENCOUNTER — Ambulatory Visit (INDEPENDENT_AMBULATORY_CARE_PROVIDER_SITE_OTHER): Payer: Medicare HMO | Admitting: Family Medicine

## 2017-07-31 ENCOUNTER — Encounter: Payer: Self-pay | Admitting: Family Medicine

## 2017-07-31 VITALS — BP 122/72 | HR 95 | Temp 97.9°F | Ht 72.0 in | Wt 205.0 lb

## 2017-07-31 DIAGNOSIS — J439 Emphysema, unspecified: Secondary | ICD-10-CM

## 2017-07-31 DIAGNOSIS — F200 Paranoid schizophrenia: Secondary | ICD-10-CM | POA: Diagnosis not present

## 2017-07-31 DIAGNOSIS — S92911D Unspecified fracture of right toe(s), subsequent encounter for fracture with routine healing: Secondary | ICD-10-CM

## 2017-07-31 NOTE — Progress Notes (Signed)
Subjective:  Patient ID: David [name], male    DOB: January 23, 1956  Age: 61 y.o. MRN: [id number]  CC: Hypertension (pt here today for routine follow up of his chronic medical conditions and also c/o right foot pain where he had fallen back at the end of June and broke his toe.)   HPI David [name] presents for Recheck of his fractured right fifth toe. He's been taping it. He decided not to use the cast shoe because it didn't have arch support and his [employer] tennis shoes do. His pain seems to be getting better. He wonders why it is still swollen.   follow-up of hypertension. Patient has no history of headache chest pain or shortness of breath or recent cough. Patient also denies symptoms of TIA such as numbness weakness lateralizing. Patient checks  blood pressure at home and has not had any elevated readings recently. Patient denies side effects from his medication. States taking it regularly.  Patient reports that he would like to have a nebulizer. He is short of breath frequently he does continue to smoke. He's cut back though from 2 packs to about 1-1-1/2 packs daily. He used a nebulizer in the hospital and says it worked well for him. [facility] He does still have some swelling on the left. He continues to take the Xarelto. He says that the leg feels tight.   Depression screen [facility] 2/9 07/31/2017 06/07/2017 05/24/2017  Decreased Interest 0 0 1  Down, Depressed, Hopeless 0 0 0  PHQ - 2 Score 0 0 1    History David [name] has a past medical history of Anxiety; COPD (chronic obstructive pulmonary disease) (HCC); GERD (gastroesophageal reflux disease); Hypertension; Pneumonia; Schizophrenia ([place]); and Tobacco use disorder.   He has a past surgical history that includes Tonsillectomy.   His family history includes Arthritis in his mother; Hypertension in his mother.He reports that he has been smoking Cigarettes.  He started smoking about 43 years ago. He has a 60.00  pack-year smoking history. He has never used smokeless tobacco. He reports that he drinks about 3.6 oz of alcohol per week . He reports that he uses drugs, including Marijuana.    ROS Review of Systems  Constitutional: Negative for chills, diaphoresis, fever and unexpected weight change.  HENT: Negative for congestion, hearing loss, rhinorrhea and sore throat.   Eyes: Negative for visual disturbance.  Respiratory: Negative for cough and shortness of breath.   Cardiovascular: Negative for chest pain.  Gastrointestinal: Negative for abdominal pain, constipation and diarrhea.  Genitourinary: Negative for dysuria and flank pain.  Musculoskeletal: Negative for arthralgias and joint swelling.  Skin: Negative for rash.  Neurological: Negative for dizziness and headaches.  Psychiatric/Behavioral: Negative for dysphoric mood and sleep disturbance.    Objective:  BP 122/72   Pulse 95   Temp 97.9 F (36.6 C) (Oral)   Ht 6' (1.829 m)   Wt 205 lb (93 kg)   BMI 27.80 kg/m   BP Readings from Last 3 Encounters:  07/31/17 122/72  06/27/17 (!) 122/40  06/07/17 132/82    Wt Readings from Last 3 Encounters:  07/31/17 205 lb (93 kg)  06/25/17 205 lb 14.9 oz (93.4 kg)  06/07/17 206 lb (93.4 kg)     Physical Exam  Constitutional: He is oriented to person, place, and time. He appears well-developed and well-nourished. No distress.  HENT:  Head: Normocephalic and atraumatic.  Right Ear: External ear normal.  Left Ear: External ear  normal.  Nose: Nose normal.  Mouth/Throat: Oropharynx is clear and moist.  Eyes: Pupils are equal, round, and reactive to light. Conjunctivae and EOM are normal.  Neck: Normal range of motion. Neck supple. No thyromegaly present.  Cardiovascular: Normal rate, regular rhythm and normal heart sounds.   No murmur heard. Pulmonary/Chest: Effort normal and breath sounds normal. No respiratory distress. He has no wheezes. He has no rales.  Abdominal: Soft. Bowel  sounds are normal. He exhibits no distension. There is no tenderness.  Musculoskeletal: Tenderness: 2+ edema with moderate tenderness for passive range of motion at the right fifth toe.  Lymphadenopathy:    He has no cervical adenopathy.  Neurological: He is alert and oriented to person, place, and time. He has normal reflexes.  Skin: Skin is warm and dry.  Psychiatric: He has a normal mood and affect. His behavior is normal. Judgment and thought content normal.      Assessment & Plan:   David [name] was seen today for hypertension.  Diagnoses and all orders for this visit:  Paranoid schizophrenia ([place])  Pulmonary emphysema, unspecified emphysema type ([place])  Closed nondisplaced fracture of phalanx of toe of right foot with routine healing, unspecified toe, subsequent encounter       I have discontinued Mr. Forcier levofloxacin. I am also having him maintain his benztropine, gabapentin, hydrOXYzine, zolpidem, haloperidol, pantoprazole, QUEtiapine, rivaroxaban, and losartan.  Allergies as of 07/31/2017      Reactions   Lithium Nausea Only, Nausea And Vomiting, Other (See Comments)   "get's very sick"   Trazodone And Nefazodone Nausea And Vomiting   Trazodone Nausea And Vomiting      Medication List       Accurate as of 07/31/17  7:12 PM. Always use your most recent med list.          benztropine 0.5 MG tablet Commonly known as:  COGENTIN Take 1 tablet (0.5 mg total) by mouth at bedtime.   gabapentin 300 MG capsule Commonly known as:  NEURONTIN Take 2 capsules (600 mg total) by mouth at bedtime.   haloperidol 5 MG tablet Commonly known as:  HALDOL Take 10 mg by mouth at bedtime.   hydrOXYzine 25 MG tablet Commonly known as:  ATARAX/VISTARIL Take 1 tablet (25 mg total) by mouth at bedtime as needed (insomnia).   losartan 50 MG tablet Commonly known as:  COZAAR TAKE 1 TABLET (50 MG TOTAL) BY MOUTH DAILY.   pantoprazole 40 MG tablet Commonly known as:   PROTONIX TAKE 1 TABLET (40 MG TOTAL) BY MOUTH DAILY.   QUEtiapine 25 MG tablet Commonly known as:  SEROQUEL 25 mg 2 (two) times daily.   rivaroxaban 20 MG Tabs tablet Commonly known as:  XARELTO Take 1 tablet (20 mg total) by mouth daily with supper.   zolpidem 5 MG tablet Commonly known as:  AMBIEN Take 5 mg by mouth at bedtime as needed for sleep.     I explained to the patient that the tightness in the leg was coming from the fluid that was causing the swelling. I encouraged him to wear stiff soled shoe with arch support. He can get an insert such as a Dr. Felicie Morn insert that would provide the arch support he needs. Follow-up: No Follow-up on file.  Claretta Fraise, M.D.

## 2017-08-16 ENCOUNTER — Ambulatory Visit (INDEPENDENT_AMBULATORY_CARE_PROVIDER_SITE_OTHER): Payer: Medicare HMO | Admitting: Family Medicine

## 2017-08-16 ENCOUNTER — Encounter: Payer: Self-pay | Admitting: Family Medicine

## 2017-08-16 VITALS — BP 124/75 | HR 107 | Temp 97.8°F | Ht 72.0 in | Wt 201.0 lb

## 2017-08-16 DIAGNOSIS — I82403 Acute embolism and thrombosis of unspecified deep veins of lower extremity, bilateral: Secondary | ICD-10-CM

## 2017-08-16 DIAGNOSIS — I10 Essential (primary) hypertension: Secondary | ICD-10-CM | POA: Diagnosis not present

## 2017-08-16 DIAGNOSIS — F200 Paranoid schizophrenia: Secondary | ICD-10-CM

## 2017-08-16 MED ORDER — TRAMADOL HCL 50 MG PO TABS: ORAL_TABLET | ORAL | 0 refills | Status: AC

## 2017-08-16 NOTE — Progress Notes (Signed)
Subjective:  Patient ID: David [name], male    DOB: 1956/03/20  Age: 61 y.o. MRN: [id number]  CC: Fall (pt here today c/o 2 falls yesterday where his legs got weak and he just went down. He didn't have his walker with him either time. He states tylenol isn't helping and he would like some pain killers for a few days for his legs.)   HPI David [name] presents for Patient tells me he can walk just fine up-and-down miles at a grocery store without his walker so he doesn't feel the need to use it at home. However he walked just a few steps into his kitchen yesterday and his legs gave out and he went down. He was following onto the ankle and foot. Been hurt so he for consult backward ended up hitting his head. He went and rested and then got up and went back into the kitchen on hours later. He fell again as he was opening the refrigerator door this time he fell backwards and again hit his head. He denies headache or pain or vision changes or focal neurologic changes at this time.    Patient also suffers from schizophrenia. He lacks insight into the episode regarding using the walker versus falling. Of note is that the patient has a DVT and is currently taking anticoagulant.  Depression screen [facility] 2/9 08/16/2017 07/31/2017 06/07/2017  Decreased Interest 0 0 0  Down, Depressed, Hopeless 0 0 0  PHQ - 2 Score 0 0 0    History David [name] has a past medical history of Anxiety; COPD (chronic obstructive pulmonary disease) (HCC); GERD (gastroesophageal reflux disease); Hypertension; Pneumonia; Schizophrenia ([place]); and Tobacco use disorder.   He has a past surgical history that includes Tonsillectomy.   His family history includes Arthritis in his mother; Hypertension in his mother.He reports that he has been smoking Cigarettes.  He started smoking about 43 years ago. He has a 60.00 pack-year smoking history. He has never used smokeless tobacco. He reports that he drinks about 3.6 oz of alcohol per week . He  reports that he uses drugs, including Marijuana.    ROS Review of Systems  Constitutional: Negative for chills, diaphoresis, fever and unexpected weight change.  HENT: Negative for congestion, hearing loss, rhinorrhea and sore throat.   Eyes: Negative for visual disturbance.  Respiratory: Negative for cough and shortness of breath.   Cardiovascular: Negative for chest pain.  Gastrointestinal: Negative for abdominal pain, constipation and diarrhea.  Genitourinary: Negative for dysuria and flank pain.  Musculoskeletal: Positive for gait problem. Negative for arthralgias and joint swelling.  Skin: Negative for rash.  Neurological: Positive for tremors and weakness (nonfocal). Negative for dizziness and headaches.  Psychiatric/Behavioral: Negative for dysphoric mood and sleep disturbance.    Objective:  BP 124/75   Pulse (!) 107   Temp 97.8 F (36.6 C) (Oral)   Ht 6' (1.829 m)   Wt 201 lb (91.2 kg)   BMI 27.26 kg/m   BP Readings from Last 3 Encounters:  08/16/17 124/75  07/31/17 122/72  06/27/17 (!) 122/40    Wt Readings from Last 3 Encounters:  08/16/17 201 lb (91.2 kg)  07/31/17 205 lb (93 kg)  06/25/17 205 lb 14.9 oz (93.4 kg)     Physical Exam  Constitutional: He is oriented to person, place, and time. He appears well-developed and well-nourished. No distress.  HENT:  Head: Normocephalic and atraumatic.  Right Ear: External ear normal.  Left Ear: External ear normal.  Nose: Nose  normal.  Mouth/Throat: Oropharynx is clear and moist.  Eyes: Pupils are equal, round, and reactive to light. Conjunctivae and EOM are normal.  Neck: Normal range of motion. Neck supple. No thyromegaly present.  Cardiovascular: Normal rate, regular rhythm and normal heart sounds.   No murmur heard. Pulmonary/Chest: Effort normal and breath sounds normal. No respiratory distress. He has no wheezes. He has no rales.  Abdominal: Soft. Bowel sounds are normal. He exhibits no distension. There  is no tenderness.  Lymphadenopathy:    He has no cervical adenopathy.  Neurological: He is alert and oriented to person, place, and time. He has normal reflexes.  Skin: Skin is warm and dry.  Psychiatric: He has a normal mood and affect. His behavior is normal. Judgment and thought content normal.      Assessment & Plan:   David [name] was seen today for fall.  Diagnoses and all orders for this visit:  Acute deep vein thrombosis (DVT) of both lower extremities, unspecified vein (HCC)  Schizophrenia, paranoid, chronic (HCC)  Essential hypertension  Other orders -     traMADol (ULTRAM) 50 MG tablet; One or two four times a day as needed for pain       I am having Mr. David [name] start on traMADol. I am also having him maintain his benztropine, gabapentin, hydrOXYzine, haloperidol, pantoprazole, QUEtiapine, rivaroxaban, losartan, and zolpidem.  Allergies as of 08/16/2017      Reactions   Lithium Nausea Only, Nausea And Vomiting, Other (See Comments)   "get's very sick"   Trazodone And Nefazodone Nausea And Vomiting   Trazodone Nausea And Vomiting      Medication List       Accurate as of 08/16/17  2:55 PM. Always use your most recent med list.          benztropine 0.5 MG tablet Commonly known as:  COGENTIN Take 1 tablet (0.5 mg total) by mouth at bedtime.   gabapentin 300 MG capsule Commonly known as:  NEURONTIN Take 2 capsules (600 mg total) by mouth at bedtime.   haloperidol 5 MG tablet Commonly known as:  HALDOL Take 10 mg by mouth at bedtime.   hydrOXYzine 25 MG tablet Commonly known as:  ATARAX/VISTARIL Take 1 tablet (25 mg total) by mouth at bedtime as needed (insomnia).   losartan 50 MG tablet Commonly known as:  COZAAR TAKE 1 TABLET (50 MG TOTAL) BY MOUTH DAILY.   pantoprazole 40 MG tablet Commonly known as:  PROTONIX TAKE 1 TABLET (40 MG TOTAL) BY MOUTH DAILY.   QUEtiapine 25 MG tablet Commonly known as:  SEROQUEL 25 mg 2 (two) times daily.     rivaroxaban 20 MG Tabs tablet Commonly known as:  XARELTO Take 1 tablet (20 mg total) by mouth daily with supper.   traMADol 50 MG tablet Commonly known as:  ULTRAM One or two four times a day as needed for pain   zolpidem 10 MG tablet Commonly known as:  AMBIEN Take 10 mg by mouth at bedtime.       Use your walker for balance and time you are on your feet. This is important whether walking just a few steps or if walking all the way across the grocery store like we discussed.Currently he shows no signs of intracranial bleeding. His insides are very poor and that he says he should be fine since he hasn't noted his scalp bleeding. I tried to explain to him that we are told about internal bleeding. He did not register understanding.  Follow-up: Return in about 2 weeks (around 08/30/2017).  Claretta Fraise, M.D.

## 2017-08-16 NOTE — Patient Instructions (Signed)
Use your walker for balance and time you are on your feet. This is important whether walking just a few steps or if walking all the way across the grocery store like we discussed.

## 2017-08-30 ENCOUNTER — Ambulatory Visit: Payer: Medicare HMO | Admitting: Family Medicine

## 2017-08-30 ENCOUNTER — Ambulatory Visit (INDEPENDENT_AMBULATORY_CARE_PROVIDER_SITE_OTHER): Payer: Medicare HMO | Admitting: Family Medicine

## 2017-08-30 DIAGNOSIS — Z9181 History of falling: Secondary | ICD-10-CM | POA: Diagnosis not present

## 2017-08-30 DIAGNOSIS — J189 Pneumonia, unspecified organism: Secondary | ICD-10-CM | POA: Diagnosis not present

## 2017-08-30 DIAGNOSIS — J440 Chronic obstructive pulmonary disease with acute lower respiratory infection: Secondary | ICD-10-CM

## 2017-08-30 DIAGNOSIS — S92351D Displaced fracture of fifth metatarsal bone, right foot, subsequent encounter for fracture with routine healing: Secondary | ICD-10-CM

## 2017-09-04 ENCOUNTER — Ambulatory Visit (INDEPENDENT_AMBULATORY_CARE_PROVIDER_SITE_OTHER): Payer: Medicare HMO | Admitting: Family Medicine

## 2017-09-04 ENCOUNTER — Encounter: Payer: Self-pay | Admitting: Family Medicine

## 2017-09-04 VITALS — BP 106/65 | HR 97 | Temp 98.7°F | Ht 72.0 in | Wt 199.0 lb

## 2017-09-04 DIAGNOSIS — I82403 Acute embolism and thrombosis of unspecified deep veins of lower extremity, bilateral: Secondary | ICD-10-CM | POA: Diagnosis not present

## 2017-09-04 DIAGNOSIS — S92911D Unspecified fracture of right toe(s), subsequent encounter for fracture with routine healing: Secondary | ICD-10-CM

## 2017-09-04 NOTE — Progress Notes (Signed)
Subjective:  Patient ID: David [name], male    DOB: 1956-05-11  Age: 61 y.o. MRN: [id number]  CC: Follow-up (pt here today following up after falling multiple times a few weeks ago but hasn't had any sense then)   HPI [place] presents for Follow-up of the DVT. He continues to use Xarelto. He has not fallen since his last office visit and before that time actually. His walking has gotten so much more steady that he has discontinued the walker also. He has some redness to the skin of the right distal anterior shin. But no edema.Patient says there is still some pain in the right dorsal foot. However he refuses to wear a postop shoe for any shoe other than an athletic shoe for support of the foot.  Depression screen [facility] 2/9 09/04/2017 08/16/2017 07/31/2017  Decreased Interest 0 0 0  Down, Depressed, Hopeless 0 0 0  PHQ - 2 Score 0 0 0    History Yovany has a past medical history of Anxiety; COPD (chronic obstructive pulmonary disease) (HCC); GERD (gastroesophageal reflux disease); Hypertension; Pneumonia; Schizophrenia ([place]); and Tobacco use disorder.   He has a past surgical history that includes Tonsillectomy.   His family history includes Arthritis in his mother; Hypertension in his mother.He reports that he has been smoking Cigarettes.  He started smoking about 43 years ago. He has a 60.00 pack-year smoking history. He has never used smokeless tobacco. He reports that he drinks about 3.6 oz of alcohol per week . He reports that he uses drugs, including Marijuana.    ROS Review of Systems  Constitutional: Negative for chills, diaphoresis and fever.  HENT: Negative for rhinorrhea and sore throat.   Respiratory: Negative for cough and shortness of breath.   Cardiovascular: Negative for chest pain.  Gastrointestinal: Negative for abdominal pain.  Musculoskeletal: Negative for arthralgias and myalgias.  Skin: Negative for rash.  Neurological: Negative for weakness and headaches.     Objective:  BP 106/65   Pulse 97   Temp 98.7 F (37.1 C) (Oral)   Ht 6' (1.829 m)   Wt 199 lb (90.3 kg)   BMI 26.99 kg/m   BP Readings from Last 3 Encounters:  09/04/17 106/65  08/16/17 124/75  07/31/17 122/72    Wt Readings from Last 3 Encounters:  09/04/17 199 lb (90.3 kg)  08/16/17 201 lb (91.2 kg)  07/31/17 205 lb (93 kg)     Physical Exam  Constitutional: He appears well-developed and well-nourished.  HENT:  Head: Normocephalic and atraumatic.  Right Ear: Tympanic membrane and external ear normal. No decreased hearing is noted.  Left Ear: Tympanic membrane and external ear normal. No decreased hearing is noted.  Mouth/Throat: No oropharyngeal exudate or posterior oropharyngeal erythema.  Eyes: Pupils are equal, round, and reactive to light.  Neck: Normal range of motion. Neck supple.  Cardiovascular: Normal rate and regular rhythm.   No murmur heard. Pulmonary/Chest: Breath sounds normal. No respiratory distress.  Abdominal: Soft.  Vitals reviewed.     Assessment & Plan:   [place]y was seen today for follow-up.  Diagnoses and all orders for this visit:  Acute deep vein thrombosis (DVT) of both lower extremities, unspecified vein (HCC)  Closed nondisplaced fracture of phalanx of toe of right foot with routine healing, unspecified toe, subsequent encounter       I am having Mr. Quattrone maintain his benztropine, gabapentin, hydrOXYzine, haloperidol, pantoprazole, QUEtiapine, rivaroxaban, losartan, zolpidem, and traMADol.  Allergies as of 09/04/2017  Reactions   Lithium Nausea Only, Nausea And Vomiting, Other (See Comments)   "get's very sick"   Trazodone And Nefazodone Nausea And Vomiting   Trazodone Nausea And Vomiting      Medication List       Accurate as of 09/04/17  3:55 PM. Always use your most recent med list.          benztropine 0.5 MG tablet Commonly known as:  COGENTIN Take 1 tablet (0.5 mg total) by mouth at bedtime.    gabapentin 300 MG capsule Commonly known as:  NEURONTIN Take 2 capsules (600 mg total) by mouth at bedtime.   haloperidol 5 MG tablet Commonly known as:  HALDOL Take 10 mg by mouth at bedtime.   hydrOXYzine 25 MG tablet Commonly known as:  ATARAX/VISTARIL Take 1 tablet (25 mg total) by mouth at bedtime as needed (insomnia).   losartan 50 MG tablet Commonly known as:  COZAAR TAKE 1 TABLET (50 MG TOTAL) BY MOUTH DAILY.   pantoprazole 40 MG tablet Commonly known as:  PROTONIX TAKE 1 TABLET (40 MG TOTAL) BY MOUTH DAILY.   QUEtiapine 25 MG tablet Commonly known as:  SEROQUEL 25 mg 2 (two) times daily.   rivaroxaban 20 MG Tabs tablet Commonly known as:  XARELTO Take 1 tablet (20 mg total) by mouth daily with supper.   traMADol 50 MG tablet Commonly known as:  ULTRAM One or two four times a day as needed for pain   zolpidem 10 MG tablet Commonly known as:  AMBIEN Take 10 mg by mouth at bedtime.        Follow-up: Return in about 3 months (around 12/04/2017).  Claretta Fraise, M.D.

## 2017-09-24 ENCOUNTER — Other Ambulatory Visit: Payer: Self-pay | Admitting: Family Medicine

## 2017-09-24 DIAGNOSIS — I82402 Acute embolism and thrombosis of unspecified deep veins of left lower extremity: Secondary | ICD-10-CM

## 2017-09-25 ENCOUNTER — Ambulatory Visit: Payer: Medicare HMO | Admitting: Family Medicine

## 2017-10-03 ENCOUNTER — Ambulatory Visit (HOSPITAL_COMMUNITY): Payer: Self-pay

## 2017-10-03 ENCOUNTER — Other Ambulatory Visit (HOSPITAL_COMMUNITY): Payer: Self-pay

## 2017-10-28 ENCOUNTER — Telehealth: Payer: Self-pay | Admitting: *Deleted

## 2017-10-28 DIAGNOSIS — I469 Cardiac arrest, cause unspecified: Secondary | ICD-10-CM | POA: Diagnosis not present

## 2017-10-31 DIAGNOSIS — 419620001 Death: Secondary | SNOMED CT | POA: Diagnosis not present

## 2017-10-31 NOTE — Telephone Encounter (Signed)
Incoming call from Quillian Quince with [place] [employer] Deputy called regarding pt [facility] requested welfare check on Mr David [name] Upon arrival sheriff found Mr Laws in residence deceased EMS confirmed Requesting Dr Livia Snellen to sign death certificate Due to pt's age and history Dr Livia Snellen deferred to medical examiner

## 2017-10-31 DEATH — deceased

## 2017-11-02 ENCOUNTER — Other Ambulatory Visit: Payer: Self-pay | Admitting: Family Medicine

## 2017-11-02 DIAGNOSIS — I10 Essential (primary) hypertension: Secondary | ICD-10-CM

## 2017-12-06 ENCOUNTER — Ambulatory Visit: Payer: Medicare HMO | Admitting: Family Medicine

## 2017-12-29 ENCOUNTER — Other Ambulatory Visit: Payer: Self-pay | Admitting: Family Medicine

## 2017-12-29 DIAGNOSIS — I82402 Acute embolism and thrombosis of unspecified deep veins of left lower extremity: Secondary | ICD-10-CM
